# Patient Record
Sex: Male | Born: 1950
Health system: Southern US, Community
[De-identification: ages and names within clinical notes are randomized; demographics above are authoritative.]

## PROBLEM LIST (undated history)

## (undated) DIAGNOSIS — C3490 Malignant neoplasm of unspecified part of unspecified bronchus or lung: Secondary | ICD-10-CM

## (undated) DIAGNOSIS — I739 Peripheral vascular disease, unspecified: Secondary | ICD-10-CM

## (undated) DIAGNOSIS — I10 Essential (primary) hypertension: Secondary | ICD-10-CM

## (undated) DIAGNOSIS — C679 Malignant neoplasm of bladder, unspecified: Secondary | ICD-10-CM

## (undated) HISTORY — PX: VASCULAR SURGERY: SHX849

## (undated) HISTORY — PX: BLADDER REMOVAL: SHX567

## (undated) HISTORY — DX: Malignant neoplasm of unspecified part of unspecified bronchus or lung: C34.90

## (undated) HISTORY — PX: OTHER SURGICAL HISTORY: SHX169

---

## 2005-10-05 ENCOUNTER — Encounter: Payer: Self-pay | Admitting: Specialist

## 2005-10-24 ENCOUNTER — Encounter: Payer: Self-pay | Admitting: Specialist

## 2007-03-31 ENCOUNTER — Ambulatory Visit: Payer: Self-pay | Admitting: Urology

## 2007-04-14 ENCOUNTER — Ambulatory Visit: Payer: Self-pay

## 2007-04-14 ENCOUNTER — Other Ambulatory Visit: Payer: Self-pay

## 2010-06-20 ENCOUNTER — Inpatient Hospital Stay: Payer: Self-pay | Admitting: Specialist

## 2010-07-21 ENCOUNTER — Emergency Department: Payer: Self-pay | Admitting: Emergency Medicine

## 2010-07-26 DIAGNOSIS — C679 Malignant neoplasm of bladder, unspecified: Secondary | ICD-10-CM

## 2010-07-26 HISTORY — DX: Malignant neoplasm of bladder, unspecified: C67.9

## 2010-12-17 DIAGNOSIS — C679 Malignant neoplasm of bladder, unspecified: Secondary | ICD-10-CM | POA: Insufficient documentation

## 2010-12-17 HISTORY — DX: Malignant neoplasm of bladder, unspecified: C67.9

## 2011-02-26 DIAGNOSIS — I745 Embolism and thrombosis of iliac artery: Secondary | ICD-10-CM | POA: Insufficient documentation

## 2011-03-29 DIAGNOSIS — F17200 Nicotine dependence, unspecified, uncomplicated: Secondary | ICD-10-CM | POA: Insufficient documentation

## 2013-01-14 ENCOUNTER — Emergency Department: Payer: Self-pay | Admitting: Emergency Medicine

## 2013-01-14 LAB — BASIC METABOLIC PANEL
Anion Gap: 8 (ref 7–16)
BUN: 33 mg/dL — ABNORMAL HIGH (ref 7–18)
Chloride: 109 mmol/L — ABNORMAL HIGH (ref 98–107)
Co2: 24 mmol/L (ref 21–32)
Creatinine: 2.06 mg/dL — ABNORMAL HIGH (ref 0.60–1.30)
EGFR (African American): 39 — ABNORMAL LOW
EGFR (Non-African Amer.): 34 — ABNORMAL LOW
Glucose: 99 mg/dL (ref 65–99)
Potassium: 4.3 mmol/L (ref 3.5–5.1)
Sodium: 141 mmol/L (ref 136–145)

## 2013-01-14 LAB — CBC
HCT: 47.8 % (ref 40.0–52.0)
HGB: 16.5 g/dL (ref 13.0–18.0)
MCHC: 34.6 g/dL (ref 32.0–36.0)
MCV: 92 fL (ref 80–100)
RBC: 5.2 10*6/uL (ref 4.40–5.90)
RDW: 13.2 % (ref 11.5–14.5)
WBC: 10 10*3/uL (ref 3.8–10.6)

## 2013-01-17 ENCOUNTER — Ambulatory Visit: Payer: Self-pay | Admitting: Vascular Surgery

## 2013-02-06 ENCOUNTER — Ambulatory Visit: Payer: Self-pay | Admitting: Vascular Surgery

## 2013-02-06 LAB — BUN: BUN: 32 mg/dL — ABNORMAL HIGH (ref 7–18)

## 2013-02-06 LAB — CREATININE, SERUM: EGFR (Non-African Amer.): 35 — ABNORMAL LOW

## 2013-03-07 ENCOUNTER — Ambulatory Visit: Payer: Self-pay | Admitting: Vascular Surgery

## 2013-03-07 LAB — CBC
HGB: 16.5 g/dL (ref 13.0–18.0)
MCH: 32.1 pg (ref 26.0–34.0)
MCHC: 34.2 g/dL (ref 32.0–36.0)
MCV: 94 fL (ref 80–100)
Platelet: 202 10*3/uL (ref 150–440)
RDW: 13.4 % (ref 11.5–14.5)
WBC: 9.5 10*3/uL (ref 3.8–10.6)

## 2013-03-07 LAB — BASIC METABOLIC PANEL
Creatinine: 1.68 mg/dL — ABNORMAL HIGH (ref 0.60–1.30)
EGFR (African American): 50 — ABNORMAL LOW
Glucose: 77 mg/dL (ref 65–99)
Osmolality: 281 (ref 275–301)
Sodium: 138 mmol/L (ref 136–145)

## 2013-03-14 ENCOUNTER — Inpatient Hospital Stay: Payer: Self-pay | Admitting: Vascular Surgery

## 2013-03-14 LAB — BASIC METABOLIC PANEL
BUN: 29 mg/dL — ABNORMAL HIGH (ref 7–18)
Calcium, Total: 8.5 mg/dL (ref 8.5–10.1)
Chloride: 110 mmol/L — ABNORMAL HIGH (ref 98–107)
Co2: 25 mmol/L (ref 21–32)
Creatinine: 2.02 mg/dL — ABNORMAL HIGH (ref 0.60–1.30)
EGFR (Non-African Amer.): 34 — ABNORMAL LOW
Glucose: 108 mg/dL — ABNORMAL HIGH (ref 65–99)
Sodium: 138 mmol/L (ref 136–145)

## 2013-03-15 LAB — LIPID PANEL
Cholesterol: 131 mg/dL (ref 0–200)
Ldl Cholesterol, Calc: 79 mg/dL (ref 0–100)
Triglycerides: 84 mg/dL (ref 0–200)
VLDL Cholesterol, Calc: 17 mg/dL (ref 5–40)

## 2013-03-15 LAB — CBC WITH DIFFERENTIAL/PLATELET
Basophil %: 0.2 %
Eosinophil #: 0.1 10*3/uL (ref 0.0–0.7)
HCT: 42 % (ref 40.0–52.0)
HGB: 14.6 g/dL (ref 13.0–18.0)
MCH: 32.3 pg (ref 26.0–34.0)
MCHC: 34.8 g/dL (ref 32.0–36.0)
Monocyte #: 0.9 x10 3/mm (ref 0.2–1.0)
Neutrophil %: 77.4 %
Platelet: 149 10*3/uL — ABNORMAL LOW (ref 150–440)
RDW: 13.5 % (ref 11.5–14.5)
WBC: 10.2 10*3/uL (ref 3.8–10.6)

## 2013-03-15 LAB — BASIC METABOLIC PANEL
Anion Gap: 3 — ABNORMAL LOW (ref 7–16)
BUN: 24 mg/dL — ABNORMAL HIGH (ref 7–18)
Chloride: 106 mmol/L (ref 98–107)
Co2: 28 mmol/L (ref 21–32)
EGFR (African American): 46 — ABNORMAL LOW
Potassium: 4.2 mmol/L (ref 3.5–5.1)
Sodium: 137 mmol/L (ref 136–145)

## 2013-03-16 LAB — BASIC METABOLIC PANEL
Anion Gap: 6 — ABNORMAL LOW (ref 7–16)
BUN: 22 mg/dL — ABNORMAL HIGH (ref 7–18)
Creatinine: 1.76 mg/dL — ABNORMAL HIGH (ref 0.60–1.30)
EGFR (Non-African Amer.): 41 — ABNORMAL LOW
Glucose: 96 mg/dL (ref 65–99)
Osmolality: 277 (ref 275–301)

## 2013-07-12 ENCOUNTER — Other Ambulatory Visit: Payer: Self-pay

## 2013-07-12 LAB — CBC WITH DIFFERENTIAL/PLATELET
Eosinophil #: 0.2 10*3/uL (ref 0.0–0.7)
HGB: 17.1 g/dL (ref 13.0–18.0)
Lymphocyte #: 1.8 10*3/uL (ref 1.0–3.6)
Lymphocyte %: 20.6 %
Monocyte #: 0.6 x10 3/mm (ref 0.2–1.0)
Neutrophil #: 6.2 10*3/uL (ref 1.4–6.5)
Neutrophil %: 69.9 %
Platelet: 185 10*3/uL (ref 150–440)
RBC: 5.43 10*6/uL (ref 4.40–5.90)

## 2013-07-12 LAB — COMPREHENSIVE METABOLIC PANEL
Anion Gap: 4 — ABNORMAL LOW (ref 7–16)
BUN: 29 mg/dL — ABNORMAL HIGH (ref 7–18)
Co2: 25 mmol/L (ref 21–32)
EGFR (African American): 42 — ABNORMAL LOW
EGFR (Non-African Amer.): 36 — ABNORMAL LOW
Glucose: 108 mg/dL — ABNORMAL HIGH (ref 65–99)
Osmolality: 286 (ref 275–301)
SGOT(AST): 19 U/L (ref 15–37)
Sodium: 140 mmol/L (ref 136–145)
Total Protein: 8.2 g/dL (ref 6.4–8.2)

## 2013-07-12 LAB — LIPID PANEL
HDL Cholesterol: 42 mg/dL (ref 40–60)
VLDL Cholesterol, Calc: 11 mg/dL (ref 5–40)

## 2014-11-15 NOTE — Op Note (Signed)
PATIENT NAME:  Martin Jennings MR#:  [id number] DATE OF BIRTH:  October 20, 1950  DATE OF PROCEDURE:  03/14/2013  PREOPERATIVE DIAGNOSES:  1.  Atherosclerotic occlusive disease, bilateral lower extremities.  2.  Gangrene, left great toe.  3.  Critical ischemia, left limb.   POSTOPERATIVE DIAGNOSES: 1.  Atherosclerotic occlusive disease, bilateral lower extremities.  2.  Gangrene, left great toe.  3.  Critical ischemia, left limb.   PROCEDURE PERFORMED:  Femoral-to-femoral bypass grafting using 6 mm PTFE.   SURGEONS: Dr. Lucky Cowboy, Dr. Delana Meyer.   ANESTHESIA: General, by endotracheal intubation.   FLUIDS: Per anesthesia record.   SPECIMEN: None.   ESTIMATED BLOOD LOSS: 200 mL.   INDICATIONS: Mr. Martin Jennings is a 64 year old gentleman who presented with increasing pain of his left foot. He was noted to have gangrenous changes of the left great toe as well as increasing skin changes of the forefoot. He has undergone attempted surgical repair of the left common femoral in the past. Angiography has demonstrated that this prior repair has occluded, and he is therefore requiring in-flow.   The risks and benefits for surgical bypass were reviewed. All questions answered. The patient agrees to proceed.   DESCRIPTION OF PROCEDURE: The patient was taken to the operating room and placed in the supine position. After adequate general anesthesia was induced and appropriate invasive monitors were placed, he was prepped from above the umbilicus down to the distal thigh and then draped in a sterile fashion. Appropriate time-out was called.   Working simultaneously with Dr. Lucky Cowboy on the left side and myself on the right, vertical incisions were created and the dissection was carried down. The common femoral, profunda femoris and superficial femoral arteries were identified and they were individually looped with Silastic vessel loops. A Gore tunneler was then used to create a tunnel for the bypass anterior to the fascia. A   6 mm ringed PTFE Propaten graft was then pulled through the tunnel, maintaining orientation with the racing stripe.   Five-thousand units of heparin were given. After 4 minutes of circulation, the right common femoral, profunda femoris, superficial femoral artery was clamped. Arteriotomy was made with a #11 blade and extended with Potts scissors. Graft was beveled in an end-graft-to-side common femoral artery anastomosis fashion with running CV-5 suture. Flushing maneuvers were performed and flow was re-established, first to the right profunda then to the superficial femoral. The graft was clamped just above the suture line. The graft was then checked for appropriate length, and again arteriotomy was made on the left, this time utilizing the distal common femoral and extending well-out onto the SFA. The graft was beveled and an end-graft-to-side femoral artery anastomosis was fashioned with running CV-5 suture. Flushing maneuvers were performed and flow was established through the graft and then into the profunda on the left, and then the superficial femoral.   Suture lines were inspected. A small area on the right side was controlled with a pledgeted suture. Surgicel was placed, followed by Evicel, and subsequently the groins were closed in multiple layers, re-approximating the femoral sheath with running 2-0 Vicryl, followed by several layers of running 3-0 Vicryl, followed by 4-0 Monocryl subcuticular. A VAC was then applied as the dressing.   The patient tolerated the procedure well. There were no immediate complications. Sponge and needle counts were correct, and he was taken to the recovery area in excellent condition.     ____________________________ Katha Cabal, MD ggs:dm D: 03/14/2013 11:00:59 ET T: 03/14/2013 11:17:43 ET JOB#: [id number]  cc: Katha Cabal, MD, <Dictator> Katha Cabal MD ELECTRONICALLY SIGNED 04/06/2013 9:11

## 2014-11-15 NOTE — Op Note (Signed)
PATIENT NAME:  Martin Jennings, Martin Jennings MR#:  [id number] DATE OF BIRTH:  October 12, 1950  DATE OF PROCEDURE:  01/17/2013  PREOPERATIVE DIAGNOSES:  1. Atherosclerotic occlusive disease, bilateral lower extremities, with rest pain of the left lower extremity.  2. Complication of vascular device; thrombosis of previous repair, left groin.   POSTOPERATIVE DIAGNOSES:  1. Atherosclerotic occlusive disease, bilateral lower extremities, with rest pain of the left lower extremity.  2. Complication of vascular device; thrombosis of previous repair, left groin.  3. Malignant hypertension.  4. Renal artery stenosis, left side.   PROCEDURES PERFORMED:  1. Abdominal aortogram.  2. Selective injection, left renal artery.  3. Bilateral lower extremity distal runoff.   PROCEDURE PERFORMED BY: Katha Cabal, M.D.   SEDATION: Versed 4 mg plus fentanyl 150 mcg administered IV. Continuous ECG, pulse oximetry and cardiopulmonary monitoring performed throughout the entire procedure by the interventional radiology nurse. Total sedation time is 1 hour.   ACCESS: A 5-French sheath, left common femoral artery.   FLUOROSCOPY TIME: 1.6 minutes.   CONTRAST USED: 70 mL.   INDICATIONS: Martin Jennings is a 64 year old gentleman who presented to the office with increasing pain of his left lower extremity. He was recently seen in the ER and subsequently referred to the office for this symptom. In the ER, he was also noted to have significant renal insufficiency with a creatinine of 2.6. The risks and benefits for evaluation of his atherosclerotic occlusive disease were reviewed with the patient. All questions have been answered. The patient agrees to proceed.   PROCEDURE: The patient is taken to the special procedures suite and placed in the supine position. After adequate sedation is achieved, both groins are prepped and draped in a sterile fashion. Lidocaine 1% is infiltrated in the soft tissues on the right. Ultrasound is placed in  a sterile sleeve. The common femoral vein is identified. It is echolucent and pulsatile, indicating patency. Image is recorded. Under direct visualization, a micropuncture needle is inserted, microwire followed by microsheath, J-wire followed by 5-French sheath and 5-French pigtail catheter.   The pigtail catheter is positioned at the level of T12, and AP projection of the aorta is obtained. Suggestion of renal artery stenosis is noted, and the patient has a blood pressure at the time of the procedure of 630/160 diastolic. He required intravenous labetalol and hydralazine to control his pressure.   The pigtail catheter is then repositioned to just above the bifurcation, and an LAO projection is obtained. Multiple stents are noted on the left, and there is thrombosis of the previous stent/repair from the origin of the common iliac to the common femoral. Oblique view of the pelvis is obtained. The detector is repositioned AP, and bilateral lower extremity runoff is obtained.   The pigtail catheter is then exchanged over a wire for a C2, and the left renal artery is engaged. Magnified views of the renal artery are then obtained.   Before closure, magnified oblique and AP views of the right external iliac are also obtained.   A closure device is then deployed. A Mynx is utilized without difficulty.   INTERPRETATION: The abdominal aorta is opacified with a bolus injection of contrast. There are no hemodynamically significant stenoses, although diffuse calcific disease is present. Suggestion of a left renal artery stenosis is confirmed on selective magnified views, where a 75% to 80% stenosis at the origin of the left renal artery is noted. Right renal artery is widely patent. There are single renal arteries bilaterally. Nephrograms appear equal  in size bilaterally.   The left common and external iliac artery as well as the common femoral artery on the left are all occluded. There are multiple stents noted.  There is patency of the distal few millimeters on the left allowing the SFA and the profunda femoris to communicate.   The right external iliac artery demonstrates 2 areas of focal stenosis, with the more prominent being approximately 65%.   The left lower extremity, as noted, the majority of the common femoral is occluded. There is patency of the profunda femoris as well as the superficial femoral and the popliteal. The trifurcation appears patent, as are all 3 tibials.   On the right, the common femoral demonstrates approximately a 30% to 40% stenosis in its midportion. Profunda femoris and SFA are patent. Popliteal on the right is patent, as is the trifurcation, and all 3 tibials are noted.   SUMMARY:  1. Occlusion of the left common iliac external and majority of the common femoral, with history of both stenting as well as open surgical repair.  2. A 75% left renal artery stenosis associated with malignant hypertension and renal insufficiency.  3. Approximately 65% stenosis of the right external iliac artery.   Given the presumed repair of the left lower extremity revascularization via a femoral-femoral bypass, I have proposed to the patient that his left renal artery must be stented to control his hypertension and hopefully to improve his renal function, and at that time, an external iliac artery stent on the right can be obtained to maximize inflow since both right and left lower extremities will be based off the right iliac system.   ____________________________ Katha Cabal, MD ggs:gb D: 01/17/2013 16:53:52 ET T: 01/17/2013 21:55:01 ET JOB#: [id number]  cc: Katha Cabal, MD, <Dictator> Katha Cabal MD ELECTRONICALLY SIGNED 01/30/2013 14:29

## 2014-11-15 NOTE — Consult Note (Signed)
PATIENT NAME:  KEESHAWN, FAKHOURI MR#:  [id number] DATE OF BIRTH:  [birth year]  DATE OF CONSULTATION:  03/14/2013  REFERRING PHYSICIAN:  Katha Cabal, MD CONSULTING PHYSICIAN:  Vivien Presto, MD PRIMARY CARE PHYSICIAN: None  REASON FOR CONSULTATION: Help with medical management.   HISTORY OF PRESENT ILLNESS: The patient is a pleasant 64 year old African American male with history of remote bladder tumor status post resection and now has urostomy bag, history of hypertension and renal artery stenosis in the past who underwent a stent placement on July 15 on the left, CKD stage IV, who does not follow with physician. The patient has been taken care of by Dr. Delana Meyer for various vascular issues, including the placement of the stent and also peripheral arterial disease with left lower extremity aortograms with runoff and a stent in the right external iliac as well. The patient came in today for scheduled fem-fem bypass for having pain at rest in the left lower extremity and foot and some ischemic changes of the great toe. Medicine was consulted for help with medical management. The patient denies having any chest pain, shortness of breath, pains in the belly. Postoperatively complains of some left eye pain and feels like there is something inside of his eye, and ophthalmology has been consulted as well. His pain at the surgical site is stable. He has had labs recently in the last week, which shows a creatinine of 1.68, lower than 2 on July 15. He is accompanied by his mother and wife who are in the room. He has no other specific complaints besides left great toe pain and the eye pain. Has had no fevers or chills and his weight is steady.   PAST MEDICAL HISTORY: As above, including history of bladder tumor status post resection and now has a urostomy bag, history of tobacco abuse which is ongoing, obesity, pancreatitis, peripheral vascular disease, status post failed left endarterectomy, status post stent  placement in renal artery as above.   ALLERGIES: No known drug allergies.   OUTPATIENT MEDICATIONS: Acetaminophen/oxycodone 325/5 mg 1 tab every 4 to 6 hours as needed, aspirin 81 mg daily, Plavix 75 mg daily, lisinopril 5 mg daily.   SOCIAL HISTORY: Still smokes about 1/2 pack to a pack a day. Used to be a drinker, quit about 30 years ago. No drug use. He is retired. He is on disability.   FAMILY HISTORY: Father and mother both with high blood pressure and cholesterol. Mom with cervical and bladder cancer.   REVIEW OF SYSTEMS:    CONSTITUTIONAL: Denies fever, but had some fatigue.  EYES: No chronic blurry vision or double vision. Postop, he has some eye pain.  RESPIRATORY: No cough, wheezing, shortness of breath, dyspnea on exertion, COPD or painful respirations.  CARDIOVASCULAR: Denies chest pain, swelling in the legs, arrhythmia or palpitations. No dyspnea on exertion, but he states he could not walk much because of pain in the lower extremity. GASTROINTESTINAL: Denies nausea, vomiting, diarrhea, abdominal pain, black stools, melena or ulcers.  GENITOURINARY: Denies dysuria, hematuria. Has had a renal artery stent.  ENDOCRINE: Denies polyuria or nocturia. HEMATOLOGIC AND LYMPHATIC: Denies anemia or easy bruising.  SKIN: No rashes.  MUSCULOSKELETAL: Has chronic pain in the left lower extremity.  NEUROLOGIC: Denies focal weakness or numbness.  PSYCHIATRIC: Denies anxiety or insomnia.   PHYSICAL EXAMINATION: VITAL SIGNS: Temperature 97.9, pulse rate 76, respiratory rate 16, blood pressure 129/70, O2 sat 93% on 2 liters.  GENERAL: The patient is a well-developed male lying in  bed in no obvious distress, has his eyes closed.  HEENT: Normocephalic, atraumatic. Pupils appear to be equal and reactive. Extraocular muscles intact. Dry mucous membranes.  NECK: Supple. No thyroid tenderness. No cervical lymphadenopathy.  CARDIOVASCULAR: S1, S2, regular rate and rhythm. No murmurs, rubs or  gallops.  LUNGS: Clear to auscultation. No wheezing, rhonchi or rales.  ABDOMEN: Soft, nontender, nondistended.  EXTREMITIES: The patient has a dressing in the groin, bilateral, wound VAC intact postop. Lower extremity shows no significant edema. The patient has significant tenderness on left great toe with some bruising and black coloration of the great toe. There is no odor and there is no significant drainage.  NEUROLOGIC: Cranial nerves appear to be intact II through XII. Sensation is intact to light touch.  PSYCHIATRIC: Awake, alert, oriented x 3.  SKIN: No other obvious rashes.   LABORATORY DATA:  No labs today except for a blood glucose of 123. On August 13, BUN was 30, creatinine  1.68, sodium 138, potassium 4.5. CBC was within normal limits.   ASSESSMENT AND PLAN: We have a 64 year old with severe peripheral vascular disease, renal artery stenosis status post stent, who has had a femoral-femoral bypass today and medicine was consulted for help with medical management. He appears to have chronic kidney disease stage III which appears to be stable, but there is no BMP for today which I would order. His blood pressure is stable. He has no fever. In regards to the fem-fem bypass, this will be taken care of by vascular surgery. He is on aspirin and Plavix. Would check a lipid profile in the morning and consider a statin. His creatinine actually is lower than what it was in the middle of this year. The patient did have renal artery stenosis on the left and has had a stent placed. We will see what the kidney function is. He is on gentle lactated Ringer's, which I would continue at this point. He still smokes tobacco and he was counseled for 3 minutes about cessation, but he does not want a patch. Consider starting deep vein prophylaxis once he is an acceptable bleeding risk. Currently, he is on sequential compression devices and TEDs. He states his pain is controlled.  Thank you for allowing [place] to see  your patient. We will follow along with you.   TOTAL TIME SPENT: 55 minutes.   ____________________________ Vivien Presto, MD sa:jm D: 03/14/2013 15:19:25 ET T: 03/14/2013 16:01:03 ET JOB#: [id number]  cc: Vivien Presto, MD, <Dictator> Vivien Presto MD ELECTRONICALLY SIGNED 04/02/2013 12:05

## 2014-11-15 NOTE — Discharge Summary (Signed)
PATIENT NAME:  Martin Jennings, Martin Jennings MR#:  [id number] DATE OF BIRTH:  [birth year]  DATE OF ADMISSION:  03/14/2013 DATE OF DISCHARGE:  03/19/2013  DISCHARGE DIAGNOSIS: Atherosclerotic occlusive disease bilateral lower extremities with ulceration of the left great toe.   SECONDARY DIAGNOSES: Hypertension, diffuse degenerative joint disease.   PROCEDURE PERFORMED: Femoral-femoral bypass, date March 14, 2013.   CONSULTATIONS: Interior and spatial designer.   HISTORY OF PRESENT ILLNESS: Mr. Waldman is a 64 year old gentleman who presents with increasing pain, ulceration, gangrenous changes to the left great toe. Physical examination as well as noninvasive studies and subsequently angiography have defined significant atherosclerosis of the left iliac as well as common femoral. He is, therefore, felt to be a poor interventional candidate and is undergoing plans for femoral-femoral bypass. In preparation, he has undergone stenting of his right external iliac to allow maximal inflow.   HOSPITAL COURSE: On the day of admission, he underwent successful femoral-to-femoral bypass grafting using a 6 mm PTFE. Dr. Lucky Cowboy and myself were co-surgeons.  Postoperatively, he has done well. He has had a normal postoperative course. He began sitting up in a chair postoperative day #1.  He began ambulating postoperative day #3.  On postoperative day #5, the VAC is removed.  His incisions all appear clean and intact and healing quite well. Dry gauze dressings were placed. He now has a palpable pulse in his left foot.   DISPOSITION:  The patient is discharged to home.   DISCHARGE INSTRUCTIONS:  He is to follow up with me in approximately 1 week. He is to continue Plavix and aspirin. No changes are made to his home medications. His diet is regular as tolerated. Smoking cessation was stressed.   CONDITION ON DISCHARGE: Improved.   ____________________________ Katha Cabal, MD ggs:cs D: 03/19/2013 17:22:09  ET T: 03/19/2013 20:08:47 ET JOB#: [id number]  cc: Katha Cabal, MD, <Dictator> Katha Cabal MD ELECTRONICALLY SIGNED 04/06/2013 9:11

## 2014-11-15 NOTE — Consult Note (Signed)
Brief Consult Note: Diagnosis: medical mgmt post fem/fem bypas.   Patient was seen by consultant.   Consult note dictated.   Recommend further assessment or treatment.   Orders entered.   Discussed with Attending MD.   Comments: 64 yo m with pvd sp fem fem bypass and medicine consulted for medical mgmt *fem-fem bypas for pvd and left great toe inshemia. per vascular. asa/plavix. check lipids in am. consider statin *renal failure, appears to be chronic stage 3. does not see a doctor. recommend f/u with nephro: last week. cr of 1.6. no other labs today. recheck bmp. on LR. continue.  *tobacco abuse counsaled for 60mn. does not want patch consider starting dvt ppx once pt is at acceptable bleedin risk. on teds. pain controlled.  Electronic Signatures: AVivien Presto(MD)  (Signed 20-Aug-14 15:08)  Authored: Brief Consult Note   Last Updated: 20-Aug-14 15:08 by AVivien Presto(MD)

## 2014-11-15 NOTE — Op Note (Signed)
PATIENT NAME:  Martin Jennings, Martin Jennings MR#:  [id number] DATE OF BIRTH:  11-11-1950  DATE OF PROCEDURE:  03/14/2013  PREOPERATIVE DIAGNOSES: 1.  Peripheral arterial disease with rest pain, left lower extremity.  2.  Status post previous failed left femoral endarterectomy, with left iliac occlusive disease.   POSTOPERATIVE DIAGNOSES: 1.  Peripheral arterial disease with rest pain, left lower extremity.  2.  Status post previous failed left femoral endarterectomy, with left iliac occlusive disease.   PROCEDURE: Right-to-left femoral-to-femoral bypass, with 6 mm ringed PTFE graft.   CO-SURGEONS:  English as a second language teacher.   ANESTHESIA: General.   BLOOD LOSS: 200 mL.   INDICATION FOR PROCEDURE: A 64 year old male with extensive peripheral vascular disease. He has a left iliac occlusion and is going to have a femoral-to-femoral bypass to try to alleviate his symptoms. Risks and benefits were discussed. Informed consent was obtained.   DESCRIPTION OF PROCEDURE: The patient was brought to the operative suite and after an adequate level of general anesthesia was obtained the abdomen, groins, and thighs were sterilely prepped and draped and a sterile surgical field was created. Vertical incisions were made overlying both femoral arteries. The left femoral artery dissection was quite tedious due to his previous endarterectomy of the left femoral artery. I dissected out so that the profunda, SFA, and proximal femoral artery to be control. A similar was done on the right, and the patient was systemically heparinized. A tunnel was then used to create a tunnel from the right groin to the left groin, and a 6 mm ringed Propaten graft was selected. Control was then obtained with vessel loops and vascular clamps.   An anterior wall arteriotomy was created with an 11-blade and extended with Potts scissors on both sides, on the left due to his good distal runoff. This was fitted down onto the superficial femoral artery. Anastomosis  was created with a running CV-6 suture bilaterally, and a single 7-0 Prolene patch with a pledget was used for hemostasis.   The wounds were then irrigated. Surgicel and Evicel topical hemostatic agents were placed and hemostasis was complete. The wound was then closed with several layers of 2-0 and 3-0 Vicryl, and then 4-0 Monocryl. A VAC sponge was cut to fit the wound and used and Ioban was used to get a good seal with the VAC. The patient was then awakened from anesthesia and taken to the recovery room in stable condition, having tolerated the procedure well.    ____________________________ Algernon Huxley, MD jsd:dm D: 03/14/2013 11:08:32 ET T: 03/14/2013 11:45:31 ET JOB#: [id number]  cc: Algernon Huxley, MD, <Dictator> Algernon Huxley MD ELECTRONICALLY SIGNED 03/28/2013 16:11

## 2014-11-15 NOTE — Op Note (Signed)
PATIENT NAME:  Martin Jennings, Martin Jennings MR#:  [id number] DATE OF BIRTH:  June 03, 1951  DATE OF PROCEDURE:  02/06/2013  PREOPERATIVE DIAGNOSES: 1.  Malignant hypertension with systolic pressures in excess of 180 mmHg and end diastolics over 505.  2.  Renal insufficiency, stage IV.  3.  Left renal artery stenosis.  4.  Atherosclerotic occlusive disease of bilateral lower extremities with rest pain of the left lower extremity.   POSTOPERATIVE DIAGNOSES:  1.  Malignant hypertension with systolic pressures in excess of 180 mmHg and end diastolics over 397.  2.  Renal insufficiency, stage IV.  3.  Left renal artery stenosis.  4.  Atherosclerotic occlusive disease of bilateral lower extremities with rest pain of the left lower extremity.   PROCEDURES PERFORMED: 1.  Introduction catheter left renal artery first order catheter placement.  2.  Percutaneous transluminal angioplasty and stent placement left renal artery with the GuardWire embolic protection device.  3.  Percutaneous transluminal angioplasty and stent placement of the right external iliac artery.   SURGEON: Martin Pilar, MD  SEDATION: Versed 4 mg plus fentanyl 150 mcg administered IV. Continuous ECG, pulse oximetry and cardiopulmonary monitoring was performed throughout the entire procedure by the interventional radiology nurse. Total sedation time was 1 hour 40 minutes.   ACCESS: 6-French sheath right common femoral artery.   FLUOROSCOPY TIME: 10.9 minutes.   CONTRAST USED: Isovue 45 mL.   INDICATIONS: Martin Jennings is a 64 year old gentleman with severe malignant hypertension in association with renal insufficiency and baseline creatinine of approximately 2.0. On diagnostic angiography, in evaluation for his lower extremity rest pain, he was noted to have 80% renal artery stenosis on the left.  Right renal artery is patent. He was also noted to have occlusion of his entire left iliac system extending into the midportion of the common femoral  on the left with a high-grade greater than 60% stenosis on the right, in the external iliac. At the time of his diagnostic evaluation, considerations for contrast burden dictated that remain a diagnostic study. His creatinine did not change over the last 7 to 10 days.  He has been stable, and therefore, he is now returning for intervention with the hope for improvement of his hypertension and his renal function as well as treatment of his iliac lesion in preparation for a femoral to femoral bypass. Risks and benefits were reviewed. All questions answered. The patient has agreed to proceed.   DESCRIPTION OF PROCEDURE: The patient is taken to special procedures and placed in the supine position. After adequate sedation is achieved, right groin is prepped and draped in sterile fashion. 1% lidocaine is infiltrated in the soft tissues. Ultrasound is placed in a sterile sleeve. Ultrasound is utilized secondary to lack of appropriate landmarks to avoid vascular injury. Under direct ultrasound visualization, the common femoral artery is identified. It is scanned from the ligament to the bifurcation and then a segment in its midportion is selected. 1% lidocaine is infiltrated into the soft tissues under ultrasound visualization and subsequently a micropuncture needle is used to access the common femoral artery. Under direct ultrasound visualization image is recorded. Artery is noted to be echolucent and pulsatile indicating patency. Microwire followed by microsheath, J-wire followed by 5-French sheath. J-wire is then upsized and a 6-French Ansel sheath is advanced to just above the aortic bifurcation. Floppy glidewire and a C2 catheter are then used to access the renal artery, and the catheter is advanced into the renal artery on the left. Hand injection of contrast  demonstrates the renal artery anatomy verified as the high-grade stricture/stenosis and 5000 units of heparin is given. Wire is reintroduced and advanced out  slightly. The catheter is then advanced and the sheath is advanced so that it is up to the origin of the renal artery. Wire is then removed.   GuardWire has been prepped on the field. Balloon inflation has been observed x 2.  Measurements are made of the renal artery, which is noted to be approximately 5 mm. Location of the initial branches is identified and the wire is advanced through the C2 catheter. The balloon is positioned just proximal to the first major divisions and inflated to 6. The balloon is well visualized on fluoroscopy. Once the balloon is inflated, the inflation device is removed, the C2 catheter is removed from the GuardWire, and a Herculink 5 x 15, 0.014 stent plaque form is advanced up and over the wire and positioned. Several hand injections of contrast is used to verify position. The stent extends into the aorta by a millimeter or 2 and subsequently the stent is deployed to 12 atmospheres for 20 seconds. The balloon is then deflated. The retrieval catheter is advanced over the wire, multiple aspirations are made, and then the retrieval catheter is removed. At this time, final images are obtained. The stent appears to be well approximated and an excellent size match to the renal artery. It extends approximately 1 mm or so into the aorta and is widely patent and uniformly expanded throughout.   The sheath is then repositioned down to the bifurcation and a 180 Magic torque wire is exchanged and an 11 cm sheath is placed. Hand injection of contrast is utilized to demonstrate the iliac, on the right, and in a LAO projection measurements are then made and a 7 x 60 Absolute Pro self-expanding stent is advanced over the wire. It is deployed just below the origin of the external iliac artery extending down. It is post dilated with a 6 x 4 balloon, 2 inflations are made, again to 10 to 12 atmospheres. Follow-up angiography demonstrates a well-expanded stent with significant improvement in the right  external iliac artery which now appears more than suitable as a base for fem-fem bypass.   Oblique view of the right groin is then obtained and a Mynx device is deployed without difficulty. There are no immediate complications.   INTERPRETATION: Initial view of the selective injection of the renal demonstrates a high-grade stenosis, as described above. Measurements demonstrate approximately a 5 mm renal artery and therefore a 5 x 15 balloon expandable stent is deployed without difficulty, retrieval is performed without difficulty, and the [employer] is removed. Oblique views of the right external are then obtained. Subsequently, a 7 x 60 Absolute Pro stent is deployed, post dilated to 6 mm, again matching the existing native artery quite nicely. Stent is well opposed and uniformly deployed.   SUMMARY: Successful left renal artery angioplasty as well as right external iliac artery angioplasty with stent placement in both locations and a very modest 45 mL of contrast burden.  ____________________________ Katha Cabal, MD ggs:sb D: 02/06/2013 10:52:18 ET T: 02/06/2013 11:17:43 ET JOB#: [id number]  cc: Katha Cabal, MD, <Dictator> Katha Cabal MD ELECTRONICALLY SIGNED 02/10/2013 11:14

## 2015-01-08 ENCOUNTER — Other Ambulatory Visit
Admission: RE | Admit: 2015-01-08 | Discharge: 2015-01-08 | Disposition: A | Discharge status: Home / Self Care | Payer: Commercial Managed Care - HMO | Source: Ambulatory Visit | Attending: Nurse Practitioner | Admitting: Nurse Practitioner

## 2015-01-08 DIAGNOSIS — I2510 Atherosclerotic heart disease of native coronary artery without angina pectoris: Secondary | ICD-10-CM | POA: Insufficient documentation

## 2015-01-08 DIAGNOSIS — Z125 Encounter for screening for malignant neoplasm of prostate: Secondary | ICD-10-CM | POA: Insufficient documentation

## 2015-01-08 LAB — CBC
HCT: 52.2 % — ABNORMAL HIGH (ref 40.0–52.0)
Hemoglobin: 17.2 g/dL (ref 13.0–18.0)
MCH: 31.2 pg (ref 26.0–34.0)
MCHC: 32.9 g/dL (ref 32.0–36.0)
MCV: 94.8 fL (ref 80.0–100.0)
PLATELETS: 173 10*3/uL (ref 150–440)
RBC: 5.51 MIL/uL (ref 4.40–5.90)
RDW: 13.4 % (ref 11.5–14.5)
WBC: 7.8 10*3/uL (ref 3.8–10.6)

## 2015-01-08 LAB — COMPREHENSIVE METABOLIC PANEL
ALBUMIN: 4 g/dL (ref 3.5–5.0)
ALT: 11 U/L — ABNORMAL LOW (ref 17–63)
ANION GAP: 6 (ref 5–15)
AST: 15 U/L (ref 15–41)
Alkaline Phosphatase: 62 U/L (ref 38–126)
BILIRUBIN TOTAL: 0.5 mg/dL (ref 0.3–1.2)
BUN: 23 mg/dL — AB (ref 6–20)
CO2: 24 mmol/L (ref 22–32)
CREATININE: 1.79 mg/dL — AB (ref 0.61–1.24)
Calcium: 9.1 mg/dL (ref 8.9–10.3)
Chloride: 108 mmol/L (ref 101–111)
GFR calc Af Amer: 45 mL/min — ABNORMAL LOW (ref >60)
GFR calc non Af Amer: 39 mL/min — ABNORMAL LOW (ref >60)
Glucose, Bld: 108 mg/dL — ABNORMAL HIGH (ref 65–99)
POTASSIUM: 4.2 mmol/L (ref 3.5–5.1)
SODIUM: 138 mmol/L (ref 135–145)
TOTAL PROTEIN: 7.7 g/dL (ref 6.5–8.1)

## 2015-01-08 LAB — LIPID PANEL
CHOLESTEROL: 176 mg/dL (ref 0–200)
HDL: 43 mg/dL (ref >40)
LDL Cholesterol: 120 mg/dL — ABNORMAL HIGH (ref 0–99)
TRIGLYCERIDES: 64 mg/dL (ref <150)
Total CHOL/HDL Ratio: 4.1 RATIO
VLDL: 13 mg/dL (ref 0–40)

## 2015-01-08 LAB — TSH: TSH: 1.346 u[IU]/mL (ref 0.350–4.500)

## 2015-01-09 LAB — PSA, SERUM (SERIAL MONITOR): Prostate Specific Ag, Serum: <0.1 ng/mL (ref 0.0–4.0)

## 2016-05-04 ENCOUNTER — Ambulatory Visit: Payer: Commercial Managed Care - HMO | Admitting: Family Medicine

## 2016-06-02 DIAGNOSIS — F17200 Nicotine dependence, unspecified, uncomplicated: Secondary | ICD-10-CM | POA: Insufficient documentation

## 2016-06-02 DIAGNOSIS — Z8551 Personal history of malignant neoplasm of bladder: Secondary | ICD-10-CM | POA: Insufficient documentation

## 2016-06-02 DIAGNOSIS — N189 Chronic kidney disease, unspecified: Secondary | ICD-10-CM | POA: Insufficient documentation

## 2016-06-02 DIAGNOSIS — N1832 Chronic kidney disease, stage 3b: Secondary | ICD-10-CM | POA: Insufficient documentation

## 2016-06-22 ENCOUNTER — Other Ambulatory Visit: Payer: Self-pay | Admitting: Internal Medicine

## 2016-06-22 ENCOUNTER — Telehealth: Payer: Self-pay | Admitting: *Deleted

## 2016-06-22 DIAGNOSIS — N183 Chronic kidney disease, stage 3 unspecified: Secondary | ICD-10-CM

## 2016-06-22 DIAGNOSIS — R911 Solitary pulmonary nodule: Secondary | ICD-10-CM

## 2016-06-22 NOTE — Telephone Encounter (Signed)
Received referral for initial lung cancer screening scan. Contacted patient and obtained smoking history,(current, 75 pack year) as well as answering questions related to screening process. Patient denies signs of lung cancer such as weight loss or hemoptysis. Patient denies comorbidity that would prevent curative treatment if lung cancer were found. Patient is tentatively scheduled for shared decision making visit and CT scan on 06/29/16 at 1:30pm, pending insurance approval from business office.

## 2016-06-28 ENCOUNTER — Other Ambulatory Visit: Payer: Self-pay | Admitting: *Deleted

## 2016-06-28 DIAGNOSIS — Z87891 Personal history of nicotine dependence: Secondary | ICD-10-CM

## 2016-06-29 ENCOUNTER — Encounter: Payer: Self-pay | Admitting: Oncology

## 2016-06-29 ENCOUNTER — Inpatient Hospital Stay: Payer: Medicare HMO | Attending: Oncology | Admitting: Oncology

## 2016-06-29 ENCOUNTER — Ambulatory Visit: Admission: RE | Admit: 2016-06-29 | Payer: Medicare HMO | Source: Ambulatory Visit

## 2016-06-29 ENCOUNTER — Ambulatory Visit
Admission: RE | Admit: 2016-06-29 | Discharge: 2016-06-29 | Disposition: A | Discharge status: Home / Self Care | Payer: Medicare HMO | Source: Ambulatory Visit | Attending: Internal Medicine | Admitting: Internal Medicine

## 2016-06-29 DIAGNOSIS — I7090 Unspecified atherosclerosis: Secondary | ICD-10-CM | POA: Diagnosis not present

## 2016-06-29 DIAGNOSIS — K8020 Calculus of gallbladder without cholecystitis without obstruction: Secondary | ICD-10-CM | POA: Insufficient documentation

## 2016-06-29 DIAGNOSIS — Z87891 Personal history of nicotine dependence: Secondary | ICD-10-CM

## 2016-06-29 DIAGNOSIS — I701 Atherosclerosis of renal artery: Secondary | ICD-10-CM | POA: Diagnosis not present

## 2016-06-29 DIAGNOSIS — N183 Chronic kidney disease, stage 3 unspecified: Secondary | ICD-10-CM

## 2016-06-29 DIAGNOSIS — D3501 Benign neoplasm of right adrenal gland: Secondary | ICD-10-CM | POA: Insufficient documentation

## 2016-06-29 DIAGNOSIS — J432 Centrilobular emphysema: Secondary | ICD-10-CM | POA: Insufficient documentation

## 2016-06-29 DIAGNOSIS — I700 Atherosclerosis of aorta: Secondary | ICD-10-CM | POA: Diagnosis not present

## 2016-06-29 DIAGNOSIS — Z122 Encounter for screening for malignant neoplasm of respiratory organs: Secondary | ICD-10-CM

## 2016-06-29 DIAGNOSIS — K760 Fatty (change of) liver, not elsewhere classified: Secondary | ICD-10-CM | POA: Insufficient documentation

## 2016-06-29 HISTORY — DX: Essential (primary) hypertension: I10

## 2016-06-29 HISTORY — DX: Malignant neoplasm of bladder, unspecified: C67.9

## 2016-06-29 LAB — POCT I-STAT CREATININE: Creatinine, Ser: 1.9 mg/dL — ABNORMAL HIGH (ref 0.61–1.24)

## 2016-06-29 MED ORDER — IOPAMIDOL (ISOVUE-300) INJECTION 61%: 100.0000 mL | Freq: Once | INTRAVENOUS | Status: DC | PRN

## 2016-06-29 MED ORDER — IOPAMIDOL (ISOVUE-300) INJECTION 61%
75.0000 mL | Freq: Once | INTRAVENOUS | Status: AC | PRN
  Administered 2016-06-29: 75 mL via INTRAVENOUS

## 2016-06-30 ENCOUNTER — Encounter: Payer: Self-pay | Admitting: *Deleted

## 2016-06-30 DIAGNOSIS — Z87891 Personal history of nicotine dependence: Secondary | ICD-10-CM | POA: Insufficient documentation

## 2016-06-30 NOTE — Progress Notes (Signed)
In accordance with CMS guidelines, patient has met eligibility criteria including age, absence of signs or symptoms of lung cancer.  Social History  Substance Use Topics  . Smoking status: Current Every Day Smoker    Packs/day: 1.25    Years: 50.00    Types: Cigarettes  . Smokeless tobacco: Not on file  . Alcohol use Not on file     A shared decision-making session was conducted prior to the performance of CT scan. This includes one or more decision aids, includes benefits and harms of screening, follow-up diagnostic testing, over-diagnosis, false positive rate, and total radiation exposure.  Counseling on the importance of adherence to annual lung cancer LDCT screening, impact of co-morbidities, and ability or willingness to undergo diagnosis and treatment is imperative for compliance of the program.  Counseling on the importance of continued smoking cessation for former smokers; the importance of smoking cessation for current smokers, and information about tobacco cessation interventions have been given to patient including [place] and 1800 quit  programs.  Written order for lung cancer screening with LDCT has been given to the patient and any and all questions have been answered to the best of my abilities.   Yearly follow up will be coordinated by Burgess Estelle, Thoracic Navigator.

## 2016-07-06 ENCOUNTER — Inpatient Hospital Stay: Admission: RE | Admit: 2016-07-06 | Payer: PRIVATE HEALTH INSURANCE | Source: Ambulatory Visit

## 2016-09-09 ENCOUNTER — Encounter (INDEPENDENT_AMBULATORY_CARE_PROVIDER_SITE_OTHER): Payer: Medicare HMO | Admitting: Vascular Surgery

## 2016-12-23 ENCOUNTER — Other Ambulatory Visit: Payer: Self-pay | Admitting: Internal Medicine

## 2016-12-23 DIAGNOSIS — R1011 Right upper quadrant pain: Secondary | ICD-10-CM

## 2016-12-27 ENCOUNTER — Ambulatory Visit
Admission: RE | Admit: 2016-12-27 | Discharge: 2016-12-27 | Disposition: A | Discharge status: Home / Self Care | Payer: Medicare HMO | Source: Ambulatory Visit | Attending: Internal Medicine | Admitting: Internal Medicine

## 2016-12-27 DIAGNOSIS — R1011 Right upper quadrant pain: Secondary | ICD-10-CM | POA: Insufficient documentation

## 2016-12-27 DIAGNOSIS — K8020 Calculus of gallbladder without cholecystitis without obstruction: Secondary | ICD-10-CM | POA: Insufficient documentation

## 2016-12-27 DIAGNOSIS — N281 Cyst of kidney, acquired: Secondary | ICD-10-CM | POA: Diagnosis not present

## 2017-01-24 ENCOUNTER — Ambulatory Visit (INDEPENDENT_AMBULATORY_CARE_PROVIDER_SITE_OTHER): Payer: Medicare HMO | Admitting: Vascular Surgery

## 2017-01-24 ENCOUNTER — Encounter (INDEPENDENT_AMBULATORY_CARE_PROVIDER_SITE_OTHER): Payer: Self-pay | Admitting: Vascular Surgery

## 2017-01-24 DIAGNOSIS — I70213 Atherosclerosis of native arteries of extremities with intermittent claudication, bilateral legs: Secondary | ICD-10-CM

## 2017-01-24 DIAGNOSIS — I10 Essential (primary) hypertension: Secondary | ICD-10-CM | POA: Diagnosis not present

## 2017-01-24 DIAGNOSIS — M79604 Pain in right leg: Secondary | ICD-10-CM | POA: Diagnosis not present

## 2017-01-24 DIAGNOSIS — E785 Hyperlipidemia, unspecified: Secondary | ICD-10-CM | POA: Diagnosis not present

## 2017-01-24 DIAGNOSIS — M79605 Pain in left leg: Secondary | ICD-10-CM

## 2017-01-25 DIAGNOSIS — I10 Essential (primary) hypertension: Secondary | ICD-10-CM | POA: Insufficient documentation

## 2017-01-25 DIAGNOSIS — M79606 Pain in leg, unspecified: Secondary | ICD-10-CM

## 2017-01-25 DIAGNOSIS — I70219 Atherosclerosis of native arteries of extremities with intermittent claudication, unspecified extremity: Secondary | ICD-10-CM | POA: Insufficient documentation

## 2017-01-25 DIAGNOSIS — E785 Hyperlipidemia, unspecified: Secondary | ICD-10-CM | POA: Insufficient documentation

## 2017-01-25 HISTORY — DX: Pain in leg, unspecified: M79.606

## 2017-01-25 NOTE — Progress Notes (Signed)
MRN : [id number]  Martin Jennings is a 66 y.o. (1950/11/03) male who presents with chief complaint of  Chief Complaint  Patient presents with  . New Evaluation    PVD  .  History of Present Illness:  The patient is seen for evaluation of painful lower extremities and diminished pulses. Patient notes the pain is always associated with activity and is very consistent day today. Typically, the pain occurs at less than one block, progress is as activity continues to the point that the patient must stop walking. Resting including standing still for several minutes allowed resumption of the activity and the ability to walk a similar distance before stopping again. Uneven terrain and inclined shorten the distance. The pain has been progressive over the past several years. The patient states the inability to walk is now having a profound negative impact on quality of life and daily activities.  He is status post iliac stenting in the past subsequently the left stents occluded. He presented approximate 4 years ago to my service with gangrene of the left great toe and underwent restenting on the right with a femoral-femoral bypass right to left. He did not return for follow-up.  The patient denies rest pain or dangling of an extremity off the side of the bed during the night for relief. No open wounds or sores at this time.  No history of back problems or DJD of the lumbar sacral spine.   The patient denies changes in claudication symptoms or new rest pain symptoms.  No new ulcers or wounds of the foot.  The patient's blood pressure has been stable and relatively well controlled. The patient denies amaurosis fugax or recent TIA symptoms. There are no recent neurological changes noted. The patient denies history of DVT, PE or superficial thrombophlebitis. The patient denies recent episodes of angina or shortness of breath.   Current Meds  Medication Sig  . amLODipine (NORVASC) 5 MG tablet   .  aspirin EC 81 MG tablet Take 81 mg by mouth.  . budesonide-formoterol (SYMBICORT) 160-4.5 MCG/ACT inhaler Inhale 2 puffs into the lungs 2 (two) times daily.  . carvedilol (COREG) 6.25 MG tablet   . clopidogrel (PLAVIX) 75 MG tablet Take by mouth.  Marland Kitchen lisinopril (PRINIVIL,ZESTRIL) 40 MG tablet   . varenicline (CHANTIX STARTING MONTH PAK) 0.5 MG X 11 & 1 MG X 42 tablet Take by mouth.    Past Medical History:  Diagnosis Date  . Bladder cancer ([place]) 2012  . Hypertension     No past surgical history on file.  Social History Social History  Substance Use Topics  . Smoking status: Current Every Day Smoker    Packs/day: 1.25    Years: 50.00    Types: Cigarettes  . Smokeless tobacco: Never Used  . Alcohol use No    Family History No family history on file. No family history of bleeding/clotting disorders, porphyria or autoimmune disease   No Known Allergies   REVIEW OF SYSTEMS (Negative unless checked)  Constitutional: [] Weight loss  [] Fever  [] Chills Cardiac: [] Chest pain   [] Chest pressure   [] Palpitations   [] Shortness of breath when laying flat   [x] Shortness of breath with exertion. Vascular:  [x] Pain in legs with walking   [] Pain in legs at rest  [] History of DVT   [] Phlebitis   [x] Swelling in legs   [] Varicose veins   [] Non-healing ulcers Pulmonary:   [] Uses home oxygen   [] Productive cough   [] Hemoptysis   [] Wheeze  [  x]COPD   [] Asthma Neurologic:  [] Dizziness   [] Seizures   [] History of stroke   [] History of TIA  [] Aphasia   [] Vissual changes   [] Weakness or numbness in arm   [] Weakness or numbness in leg Musculoskeletal:   [] Joint swelling   [x] Joint pain   [] Low back pain Hematologic:  [] Easy bruising  [] Easy bleeding   [] Hypercoagulable state   [] Anemic Gastrointestinal:  [] Diarrhea   [] Vomiting  [] Gastroesophageal reflux/heartburn   [] Difficulty swallowing. Genitourinary:  [] Chronic kidney disease   [] Difficult urination  [] Frequent urination   [] Blood in urine Skin:   [] Rashes   [] Ulcers  Psychological:  [] History of anxiety   []  History of major depression.  Physical Examination  Vitals:   01/24/17 1008  BP: (!) 142/81  Pulse: 65  Resp: 16  Weight: 179 lb (81.2 kg)  Height: 5\' 11"  (1.803 m)   Body mass index is 24.97 kg/m. Gen: WD/WN, NAD Head: [place]/AT, No temporalis wasting.  Ear/Nose/Throat: Hearing grossly intact, nares w/o erythema or drainage, poor dentition Eyes: PER, EOMI, sclera nonicteric.  Neck: Supple, no masses.  No bruit or JVD.  Pulmonary:  Good air movement, clear to auscultation bilaterally, no use of accessory muscles.  Cardiac: RRR, normal S1, S2, no Murmurs. Vascular: Feet are cool to the touch there is 2 second capillary refill. No open wounds or sores. There are mild venous stasis changes to the skin with 1+ pitting edema  Vessel Right Left  Radial Palpable Palpable  Ulnar Palpable Palpable  Brachial Palpable Palpable  Carotid Palpable Palpable  Femoral Palpable Palpable  Popliteal Not Palpable Not Palpable  PT Not Palpable Not Palpable  DP Not Palpable Not Palpable   Gastrointestinal: soft, non-distended. No guarding/no peritoneal signs.  Musculoskeletal: M/S 5/5 throughout.  No deformity or atrophy.  Neurologic: CN 2-12 intact. Pain and light touch intact in extremities.  Symmetrical.  Speech is fluent. Motor exam as listed above. Psychiatric: Judgment intact, Mood & affect appropriate for pt's clinical situation. Dermatologic: venous rashes no ulcers noted.  No changes consistent with cellulitis. Lymph : No Cervical lymphadenopathy, no lichenification or skin changes of chronic lymphedema.  CBC Lab Results  Component Value Date   WBC 7.8 01/08/2015   HGB 17.2 01/08/2015   HCT 52.2 (H) 01/08/2015   MCV 94.8 01/08/2015   PLT 173 01/08/2015    BMET    Component Value Date/Time   NA 138 01/08/2015 1151   NA 140 07/12/2013 1421   K 4.2 01/08/2015 1151   K 4.0 07/12/2013 1421   CL 108 01/08/2015 1151   CL  111 (H) 07/12/2013 1421   CO2 24 01/08/2015 1151   CO2 25 07/12/2013 1421   GLUCOSE 108 (H) 01/08/2015 1151   GLUCOSE 108 (H) 07/12/2013 1421   BUN 23 (H) 01/08/2015 1151   BUN 29 (H) 07/12/2013 1421   CREATININE 1.90 (H) 06/29/2016 1454   CREATININE 1.95 (H) 07/12/2013 1421   CALCIUM 9.1 01/08/2015 1151   CALCIUM 9.0 07/12/2013 1421   GFRNONAA 39 (L) 01/08/2015 1151   GFRNONAA 36 (L) 07/12/2013 1421   GFRAA 45 (L) 01/08/2015 1151   GFRAA 42 (L) 07/12/2013 1421   CrCl cannot be calculated (Patient's most recent lab result is older than the maximum 21 days allowed.).  COAG Lab Results  Component Value Date   INR 1.1 03/15/2013    Radiology US Abdomen Complete  Result Date: 12/27/2016 CLINICAL DATA:  Right upper quadrant abdominal pain and fever for the past week. History  of gallstones. EXAM: ABDOMEN ULTRASOUND COMPLETE COMPARISON:  Abdominal and pelvic CT scan of June 29, 2016 FINDINGS: Gallbladder: The gallbladder is adequately distended. There is an echogenic mobile stone measuring 8 mm in diameter. There is no gallbladder wall thickening, pericholecystic fluid, or positive sonographic Murphy's sign. Common bile duct: Diameter: 2 mm Liver: No focal lesion identified. Within normal limits in parenchymal echogenicity. IVC: No abnormality visualized. Pancreas: Bowel gas limits visualization of the pancreas. Spleen: Size and appearance within normal limits. Right Kidney: Length: 12.4 cm. The renal cortical echotexture is approximately equal to that of the adjacent liver. There is a lower pole cyst measuring 1.7 cm in greatest dimension. There is no hydronephrosis. Left Kidney: Length: There is mural plaque with maximal diameter measured at 2.7 cm proximally. The renal cortical echotexture is mildly increased similar to that on the right. There is an exophytic upper pole cyst measuring 1.2 cm in greatest dimension. There is no hydronephrosis. Abdominal aorta: Maximal diameter is 2.7 cm with  a normal tapering caliber. There is mural plaque. Other findings: There is no ascites. IMPRESSION: Gallstones without sonographic evidence of acute cholecystitis. Normal appearance of the liver and common bile duct. Normal appearance of the liver, pancreas, and spleen. Mildly increased renal cortical echotexture bilaterally may reflect medical renal disease. Electronically Signed   By: David  Martinique M.D.   On: 12/27/2016 08:42     Assessment/Plan 1. Atherosclerosis of native artery of both lower extremities with intermittent claudication (HCC) Recommend:  The patient has a history of extensive vascular procedures including iliac stenting and urgent femoral to femoral bypass for treatment of a gangrenous left great toe. This was done in 2014 and he has been lost to follow-up since. He is not complaining of increasing pain in his legs particularly with ambulation.  Patient should undergo arterial duplex of the lower extremity given the patient's lower extremity symptoms.  The patient states they are having increased pain and a marked decrease in the distance that they can walk.  The risks and benefits as well as the alternatives were discussed in detail with the patient.  All questions were answered.  Patient agrees to proceed and understands this could be a prelude to angiography and intervention.  The patient will follow up with me in the office to review the studies.    A total of 65 minutes was spent with this patient and greater than 50% was spent in counseling and coordination of care with the patient.  Discussion included the treatment options for vascular disease including indications for surgery and intervention.  Also discussed is the appropriate timing of treatment.  In addition medical therapy was discussed.  - VAS US AORTA/IVC/ILIACS; Future - VAS [place] ABI WITH/WO TBI; Future  2. Pain in both lower extremities See #1  3. Essential hypertension Continue antihypertensive medications  as already ordered, these medications have been reviewed and there are no changes at this time.   4. Hyperlipidemia, unspecified hyperlipidemia type Continue statin as ordered and reviewed, no changes at this time     Hortencia Pilar, MD  01/25/2017 9:20 AM

## 2017-02-04 ENCOUNTER — Encounter (INDEPENDENT_AMBULATORY_CARE_PROVIDER_SITE_OTHER): Payer: Self-pay

## 2017-03-02 ENCOUNTER — Encounter (INDEPENDENT_AMBULATORY_CARE_PROVIDER_SITE_OTHER): Payer: Medicare HMO

## 2017-03-24 ENCOUNTER — Ambulatory Visit (INDEPENDENT_AMBULATORY_CARE_PROVIDER_SITE_OTHER): Payer: Medicare HMO

## 2017-03-24 DIAGNOSIS — I70213 Atherosclerosis of native arteries of extremities with intermittent claudication, bilateral legs: Secondary | ICD-10-CM

## 2017-03-31 ENCOUNTER — Encounter (INDEPENDENT_AMBULATORY_CARE_PROVIDER_SITE_OTHER): Payer: Self-pay | Admitting: Vascular Surgery

## 2017-03-31 ENCOUNTER — Ambulatory Visit (INDEPENDENT_AMBULATORY_CARE_PROVIDER_SITE_OTHER): Payer: Medicare HMO | Admitting: Vascular Surgery

## 2017-03-31 VITALS — BP 127/74 | HR 73 | Resp 17 | Ht 72.0 in | Wt 186.0 lb

## 2017-03-31 DIAGNOSIS — I10 Essential (primary) hypertension: Secondary | ICD-10-CM

## 2017-03-31 DIAGNOSIS — M79604 Pain in right leg: Secondary | ICD-10-CM | POA: Diagnosis not present

## 2017-03-31 DIAGNOSIS — I70222 Atherosclerosis of native arteries of extremities with rest pain, left leg: Secondary | ICD-10-CM

## 2017-03-31 DIAGNOSIS — E785 Hyperlipidemia, unspecified: Secondary | ICD-10-CM

## 2017-03-31 DIAGNOSIS — M79605 Pain in left leg: Secondary | ICD-10-CM

## 2017-04-01 ENCOUNTER — Other Ambulatory Visit (INDEPENDENT_AMBULATORY_CARE_PROVIDER_SITE_OTHER): Payer: Self-pay

## 2017-04-01 ENCOUNTER — Encounter (INDEPENDENT_AMBULATORY_CARE_PROVIDER_SITE_OTHER): Payer: Self-pay

## 2017-04-01 MED ORDER — DEXTROSE 5 % IV SOLN: 2.0000 g | Freq: Once | INTRAVENOUS | Status: DC

## 2017-04-03 DIAGNOSIS — I70229 Atherosclerosis of native arteries of extremities with rest pain, unspecified extremity: Secondary | ICD-10-CM | POA: Insufficient documentation

## 2017-04-03 NOTE — Progress Notes (Signed)
MRN : [id number]  Martin Jennings is a 66 y.o. ([birth year]) male who presents with chief complaint of  Chief Complaint  Patient presents with  . Follow-up    Ultrasound f/u  .  History of Present Illness: The patient returns to the office for followup and review of the noninvasive studies. There has been a significant deterioration in the lower extremity symptoms.  The patient notes interval shortening of their claudication distance and development of mild rest pain symptoms. No new ulcers or wounds have occurred since the last visit.  There have been no significant changes to the patient's overall health care.  The patient denies amaurosis fugax or recent TIA symptoms. There are no recent neurological changes noted. The patient denies history of DVT, PE or superficial thrombophlebitis. The patient denies recent episodes of angina or shortness of breath.    Current Meds  Medication Sig  . amLODipine (NORVASC) 10 MG tablet   . aspirin EC 81 MG tablet Take 81 mg by mouth.  Marland Kitchen atorvastatin (LIPITOR) 20 MG tablet   . budesonide-formoterol (SYMBICORT) 160-4.5 MCG/ACT inhaler Inhale 2 puffs into the lungs 2 (two) times daily.  . carvedilol (COREG) 6.25 MG tablet   . clopidogrel (PLAVIX) 75 MG tablet Take by mouth.  Marland Kitchen lisinopril (PRINIVIL,ZESTRIL) 40 MG tablet    Current Facility-Administered Medications for the 03/31/17 encounter (Office Visit) with Delana Meyer, Dolores Lory, MD  Medication  . ceFAZolin (ANCEF) 2 g in dextrose 5 % 100 mL IVPB    Past Medical History:  Diagnosis Date  . Bladder cancer ([place]) 2012  . Hypertension     No past surgical history on file.  Social History Social History  Substance Use Topics  . Smoking status: Current Every Day Smoker    Packs/day: 1.25    Years: 50.00    Types: Cigarettes  . Smokeless tobacco: Never Used  . Alcohol use No    Family History No family history on file.  No Known Allergies   REVIEW OF SYSTEMS (Negative unless  checked)  Constitutional: [] Weight loss  [] Fever  [] Chills Cardiac: [] Chest pain   [] Chest pressure   [] Palpitations   [] Shortness of breath when laying flat   [] Shortness of breath with exertion. Vascular:  [x] Pain in legs with walking   [x] Pain in legs at rest  [] History of DVT   [] Phlebitis   [] Swelling in legs   [] Varicose veins   [] Non-healing ulcers Pulmonary:   [] Uses home oxygen   [] Productive cough   [] Hemoptysis   [] Wheeze  [] COPD   [] Asthma Neurologic:  [] Dizziness   [] Seizures   [] History of stroke   [] History of TIA  [] Aphasia   [] Vissual changes   [] Weakness or numbness in arm   [] Weakness or numbness in leg Musculoskeletal:   [] Joint swelling   [] Joint pain   [] Low back pain Hematologic:  [] Easy bruising  [] Easy bleeding   [] Hypercoagulable state   [] Anemic Gastrointestinal:  [] Diarrhea   [] Vomiting  [] Gastroesophageal reflux/heartburn   [] Difficulty swallowing. Genitourinary:  [] Chronic kidney disease   [] Difficult urination  [] Frequent urination   [] Blood in urine Skin:  [] Rashes   [] Ulcers  Psychological:  [] History of anxiety   []  History of major depression.  Physical Examination  Vitals:   03/31/17 1649  BP: 127/74  Pulse: 73  Resp: 17  Weight: 84.4 kg (186 lb)  Height: 6' (1.829 m)   Body mass index is 25.23 kg/m. Gen: WD/WN, NAD Head: [place]/AT, No temporalis wasting.  Ear/Nose/Throat: Hearing  grossly intact, nares w/o erythema or drainage Eyes: PER, EOMI, sclera nonicteric.  Neck: Supple, no large masses.   Pulmonary:  Good air movement, no audible wheezing bilaterally, no use of accessory muscles.  Cardiac: RRR, no JVD Vascular:  Vessel Right Left  Radial Palpable Palpable  PT Not Palpable Not Palpable  DP Not Palpable Not Palpable  Gastrointestinal: Non-distended. No guarding/no peritoneal signs.  Musculoskeletal: M/S 5/5 throughout.  No deformity or atrophy.  Neurologic: CN 2-12 intact. Symmetrical.  Speech is fluent. Motor exam as listed  above. Psychiatric: Judgment intact, Mood & affect appropriate for pt's clinical situation. Dermatologic: No rashes or ulcers noted.  No changes consistent with cellulitis. Lymph : No lichenification or skin changes of chronic lymphedema.  CBC Lab Results  Component Value Date   WBC 7.8 01/08/2015   HGB 17.2 01/08/2015   HCT 52.2 (H) 01/08/2015   MCV 94.8 01/08/2015   PLT 173 01/08/2015    BMET    Component Value Date/Time   NA 138 01/08/2015 1151   NA 140 07/12/2013 1421   K 4.2 01/08/2015 1151   K 4.0 07/12/2013 1421   CL 108 01/08/2015 1151   CL 111 (H) 07/12/2013 1421   CO2 24 01/08/2015 1151   CO2 25 07/12/2013 1421   GLUCOSE 108 (H) 01/08/2015 1151   GLUCOSE 108 (H) 07/12/2013 1421   BUN 23 (H) 01/08/2015 1151   BUN 29 (H) 07/12/2013 1421   CREATININE 1.90 (H) 06/29/2016 1454   CREATININE 1.95 (H) 07/12/2013 1421   CALCIUM 9.1 01/08/2015 1151   CALCIUM 9.0 07/12/2013 1421   GFRNONAA 39 (L) 01/08/2015 1151   GFRNONAA 36 (L) 07/12/2013 1421   GFRAA 45 (L) 01/08/2015 1151   GFRAA 42 (L) 07/12/2013 1421   CrCl cannot be calculated (Patient's most recent lab result is older than the maximum 21 days allowed.).  COAG Lab Results  Component Value Date   INR 1.1 03/15/2013    Radiology No results found.  Assessment/Plan 1. Atherosclerosis of native artery of left lower extremity with rest pain (HCC) Recommend:  The patient has evidence of severe atherosclerotic changes of both lower extremities with rest pain that is associated with preulcerative changes and impending tissue loss of the left foot.  This represents a limb threatening ischemia and places the patient at the risk for limb loss.  Patient should undergo angiography of the left lower extremity with the hope for intervention for limb salvage.  The risks and benefits as well as the alternative therapies was discussed in detail with the patient.  All questions were answered.  Patient agrees to proceed with  angiography.  The patient will follow up with me in the office after the procedure.     2. Hyperlipidemia, unspecified hyperlipidemia type Continue statin as ordered and reviewed, no changes at this time   3. Pain in both lower extremities See number 1  4. Essential hypertension Continue antihypertensive medications as already ordered, these medications have been reviewed and there are no changes at this time.     Hortencia Pilar, MD  04/03/2017 4:27 PM

## 2017-04-11 ENCOUNTER — Encounter
Admission: RE | Admit: 2017-04-11 | Discharge: 2017-04-11 | Disposition: A | Discharge status: Home / Self Care | Payer: Medicare HMO | Source: Ambulatory Visit | Attending: Vascular Surgery | Admitting: Vascular Surgery

## 2017-04-11 DIAGNOSIS — E785 Hyperlipidemia, unspecified: Secondary | ICD-10-CM | POA: Diagnosis not present

## 2017-04-11 DIAGNOSIS — T82868A Thrombosis of vascular prosthetic devices, implants and grafts, initial encounter: Secondary | ICD-10-CM | POA: Diagnosis not present

## 2017-04-11 DIAGNOSIS — I70222 Atherosclerosis of native arteries of extremities with rest pain, left leg: Secondary | ICD-10-CM | POA: Diagnosis not present

## 2017-04-11 DIAGNOSIS — F17210 Nicotine dependence, cigarettes, uncomplicated: Secondary | ICD-10-CM | POA: Diagnosis not present

## 2017-04-11 DIAGNOSIS — I10 Essential (primary) hypertension: Secondary | ICD-10-CM | POA: Diagnosis not present

## 2017-04-11 DIAGNOSIS — Y832 Surgical operation with anastomosis, bypass or graft as the cause of abnormal reaction of the patient, or of later complication, without mention of misadventure at the time of the procedure: Secondary | ICD-10-CM | POA: Diagnosis not present

## 2017-04-11 DIAGNOSIS — Z8551 Personal history of malignant neoplasm of bladder: Secondary | ICD-10-CM | POA: Diagnosis not present

## 2017-04-11 HISTORY — DX: Peripheral vascular disease, unspecified: I73.9

## 2017-04-11 LAB — CREATININE, SERUM
Creatinine, Ser: 1.65 mg/dL — ABNORMAL HIGH (ref 0.61–1.24)
GFR calc Af Amer: 48 mL/min — ABNORMAL LOW (ref >60)
GFR calc non Af Amer: 42 mL/min — ABNORMAL LOW (ref >60)

## 2017-04-11 LAB — BUN: BUN: 20 mg/dL (ref 6–20)

## 2017-04-12 ENCOUNTER — Encounter
Admission: RE | Disposition: A | Discharge status: Home / Self Care | Payer: Self-pay | Source: Ambulatory Visit | Attending: Vascular Surgery

## 2017-04-12 ENCOUNTER — Encounter: Payer: Self-pay | Admitting: Vascular Surgery

## 2017-04-12 ENCOUNTER — Ambulatory Visit
Admission: RE | Admit: 2017-04-12 | Discharge: 2017-04-12 | Disposition: A | Discharge status: Home / Self Care | Payer: Medicare HMO | Source: Ambulatory Visit | Attending: Vascular Surgery | Admitting: Vascular Surgery

## 2017-04-12 DIAGNOSIS — T82868A Thrombosis of vascular prosthetic devices, implants and grafts, initial encounter: Secondary | ICD-10-CM | POA: Insufficient documentation

## 2017-04-12 DIAGNOSIS — I70211 Atherosclerosis of native arteries of extremities with intermittent claudication, right leg: Secondary | ICD-10-CM | POA: Diagnosis not present

## 2017-04-12 DIAGNOSIS — Z8551 Personal history of malignant neoplasm of bladder: Secondary | ICD-10-CM | POA: Insufficient documentation

## 2017-04-12 DIAGNOSIS — E785 Hyperlipidemia, unspecified: Secondary | ICD-10-CM | POA: Insufficient documentation

## 2017-04-12 DIAGNOSIS — F17210 Nicotine dependence, cigarettes, uncomplicated: Secondary | ICD-10-CM | POA: Insufficient documentation

## 2017-04-12 DIAGNOSIS — I70222 Atherosclerosis of native arteries of extremities with rest pain, left leg: Secondary | ICD-10-CM | POA: Insufficient documentation

## 2017-04-12 DIAGNOSIS — Y832 Surgical operation with anastomosis, bypass or graft as the cause of abnormal reaction of the patient, or of later complication, without mention of misadventure at the time of the procedure: Secondary | ICD-10-CM | POA: Insufficient documentation

## 2017-04-12 DIAGNOSIS — I10 Essential (primary) hypertension: Secondary | ICD-10-CM | POA: Insufficient documentation

## 2017-04-12 HISTORY — PX: LOWER EXTREMITY ANGIOGRAPHY: CATH118251

## 2017-04-12 SURGERY — LOWER EXTREMITY ANGIOGRAPHY
Anesthesia: Moderate Sedation | Laterality: Left

## 2017-04-12 MED ORDER — FENTANYL CITRATE (PF) 100 MCG/2ML IJ SOLN
INTRAMUSCULAR | Status: DC | PRN
  Administered 2017-04-12 (×4): 25 ug via INTRAVENOUS
  Administered 2017-04-12: 50 ug via INTRAVENOUS
  Administered 2017-04-12: 25 ug via INTRAVENOUS

## 2017-04-12 MED ORDER — HEPARIN (PORCINE) IN NACL 2-0.9 UNIT/ML-% IJ SOLN
INTRAMUSCULAR | Status: AC
  Filled 2017-04-12: qty 1000

## 2017-04-12 MED ORDER — CEFAZOLIN SODIUM-DEXTROSE 2-4 GM/100ML-% IV SOLN
2.0000 g | Freq: Once | INTRAVENOUS | Status: AC
Start: 2017-04-12 — End: 2017-04-12
  Administered 2017-04-12: 2 g via INTRAVENOUS

## 2017-04-12 MED ORDER — SODIUM CHLORIDE 0.9 % IV BOLUS (SEPSIS)
250.0000 mL | Freq: Once | INTRAVENOUS | Status: AC
  Administered 2017-04-12: 250 mL via INTRAVENOUS

## 2017-04-12 MED ORDER — IOPAMIDOL (ISOVUE-300) INJECTION 61%
INTRAVENOUS | Status: DC | PRN
  Administered 2017-04-12: 85 mL via INTRAVENOUS

## 2017-04-12 MED ORDER — MORPHINE SULFATE (PF) 4 MG/ML IV SOLN: 2.0000 mg | INTRAVENOUS | Status: DC | PRN

## 2017-04-12 MED ORDER — HEPARIN SODIUM (PORCINE) 1000 UNIT/ML IJ SOLN
INTRAMUSCULAR | Status: AC
  Filled 2017-04-12: qty 1

## 2017-04-12 MED ORDER — SODIUM CHLORIDE 0.9 % IV SOLN
INTRAVENOUS | Status: DC
  Administered 2017-04-12: 12:00:00 via INTRAVENOUS

## 2017-04-12 MED ORDER — FENTANYL CITRATE (PF) 100 MCG/2ML IJ SOLN
INTRAMUSCULAR | Status: AC
  Filled 2017-04-12: qty 2

## 2017-04-12 MED ORDER — ALTEPLASE 2 MG IJ SOLR
INTRAMUSCULAR | Status: DC | PRN
  Administered 2017-04-12: 10 mg

## 2017-04-12 MED ORDER — MIDAZOLAM HCL 5 MG/5ML IJ SOLN
INTRAMUSCULAR | Status: AC
  Filled 2017-04-12: qty 5

## 2017-04-12 MED ORDER — LIDOCAINE HCL (PF) 1 % IJ SOLN
INTRAMUSCULAR | Status: AC
  Filled 2017-04-12: qty 30

## 2017-04-12 MED ORDER — HEPARIN SODIUM (PORCINE) 1000 UNIT/ML IJ SOLN
INTRAMUSCULAR | Status: DC | PRN
  Administered 2017-04-12: 5000 [IU] via INTRAVENOUS

## 2017-04-12 MED ORDER — ALTEPLASE 2 MG IJ SOLR
INTRAMUSCULAR | Status: AC
  Filled 2017-04-12: qty 10

## 2017-04-12 MED ORDER — MIDAZOLAM HCL 2 MG/2ML IJ SOLN
INTRAMUSCULAR | Status: DC | PRN
  Administered 2017-04-12 (×2): 0.5 mg via INTRAVENOUS
  Administered 2017-04-12: 1 mg via INTRAVENOUS
  Administered 2017-04-12: 0.5 mg via INTRAVENOUS
  Administered 2017-04-12: 2 mg via INTRAVENOUS
  Administered 2017-04-12: 0.5 mg via INTRAVENOUS

## 2017-04-12 MED ORDER — CEFAZOLIN SODIUM-DEXTROSE 1-4 GM/50ML-% IV SOLN
INTRAVENOUS | Status: AC
  Filled 2017-04-12: qty 100

## 2017-04-12 SURGICAL SUPPLY — 20 items
BALLN DORADO 6X40X80 (BALLOONS) ×2
BALLN LUTONIX DCB 7X60X130 (BALLOONS) ×4
BALLOON DORADO 6X40X80 (BALLOONS) ×1 IMPLANT
BALLOON LUTONIX DCB 7X60X130 (BALLOONS) ×2 IMPLANT
CANISTER PENUMBRA MAX (MISCELLANEOUS) ×2 IMPLANT
CATH BEACON 5 .035 40 KMP TP (CATHETERS) ×1 IMPLANT
CATH BEACON 5 .038 40 KMP TP (CATHETERS) ×1
CATH INDIGO 6 ST-TIP 135CM (CATHETERS) ×2 IMPLANT
CATH PIG 70CM (CATHETERS) ×2 IMPLANT
DEVICE CLOSURE MYNXGRIP 6/7F (Vascular Products) ×2 IMPLANT
DEVICE PRESTO INFLATION (MISCELLANEOUS) ×2 IMPLANT
GLIDEWIRE STIFF .35X180X3 HYDR (WIRE) ×2 IMPLANT
NEEDLE ENTRY 21GA 7CM ECHOTIP (NEEDLE) ×2 IMPLANT
PACK ANGIOGRAPHY (CUSTOM PROCEDURE TRAY) ×2 IMPLANT
SET INTRO CAPELLA COAXIAL (SET/KITS/TRAYS/PACK) ×2 IMPLANT
SHEATH BRITE TIP 5FRX11 (SHEATH) ×2 IMPLANT
SHEATH BRITE TIP 6FRX11 (SHEATH) ×2 IMPLANT
TUBING ASPIRATION INDIGO (MISCELLANEOUS) ×2 IMPLANT
WIRE J 3MM .035X145CM (WIRE) ×2 IMPLANT
WIRE SPARTACORE .014X300CM (WIRE) ×2 IMPLANT

## 2017-04-12 NOTE — Progress Notes (Signed)
Pt awake and alert, sitting in bed in NAD.  Pt eating and drinking w/o difficulty.  Pt denies any pain at this time.  Dr. Delana Meyer at bedside to discuss procedure with pt and family.  Discharge and follow up instructions reviewed with pt and family who verbalize understanding.  Return precautions reviewed and pt verbalizes understanding.

## 2017-04-12 NOTE — Discharge Instructions (Signed)
Groin Insertion Instructions-If you lose feeling or develop tingling or pain in your leg or foot after the procedure, please walk around first.  If the discomfort does not improve , contact your physician and proceed to the nearest emergency room.  Loss of feeling in your leg might mean that a blockage has formed in the artery and this can be appropriately treated.  Limit your activity for the next two days after your procedure.  Avoid stooping, bending, heavy lifting or exertion as this may put pressure on the insertion site.  Resume normal activities in 48 hours.  You may shower after 24 hours but avoid excessive warm water and do not scrub the site.  Remove clear dressing in 48 hours.  If you have had a closure device inserted, do not soak in a tub bath or a hot tub for at least one week. ° °No driving for 48 hours after discharge.  After the procedure, check the insertion site occasionally.  If any oozing occurs or there is apparent swelling, firm pressure over the site will prevent a bruise from forming.  You can not hurt anything by pressing directly on the site.  The pressure stops the bleeding by allowing a small clot to form.  If the bleeding continues after the pressure has been applied for more than 15 minutes, call 911 or go to the nearest emergency room.   ° °The x-ray dye causes you to pass a considerate amount of urine.  For this reason, you will be asked to drink plenty of liquids after the procedure to prevent dehydration.  You may resume you regular diet.  Avoid caffeine products.   ° °For pain at the site of your procedure, take non-aspirin medicines such as Tylenol. ° °Medications: A. Hold Metformin for 48 hours if applicable.  B. Continue taking all your present medications at home unless your doctor prescribes any changes. ° °Moderate Conscious Sedation, Adult, Care After °These instructions provide you with information about caring for yourself after your procedure. Your health care provider  may also give you more specific instructions. Your treatment has been planned according to current medical practices, but problems sometimes occur. Call your health care provider if you have any problems or questions after your procedure. °What can I expect after the procedure? °After your procedure, it is common: °· To feel sleepy for several hours. °· To feel clumsy and have poor balance for several hours. °· To have poor judgment for several hours. °· To vomit if you eat too soon. ° °Follow these instructions at home: °For at least 24 hours after the procedure: ° °· Do not: °? Participate in activities where you could fall or become injured. °? Drive. °? Use heavy machinery. °? Drink alcohol. °? Take sleeping pills or medicines that cause drowsiness. °? Make important decisions or sign legal documents. °? Take care of children on your own. °· Rest. °Eating and drinking °· Follow the diet recommended by your health care provider. °· If you vomit: °? Drink water, juice, or soup when you can drink without vomiting. °? Make sure you have little or no nausea before eating solid foods. °General instructions °· Have a responsible adult stay with you until you are awake and alert. °· Take over-the-counter and prescription medicines only as told by your health care provider. °· If you smoke, do not smoke without supervision. °· Keep all follow-up visits as told by your health care provider. This is important. °Contact a health care provider   if: °· You keep feeling nauseous or you keep vomiting. °· You feel light-headed. °· You develop a rash. °· You have a fever. °Get help right away if: °· You have trouble breathing. °This information is not intended to replace advice given to you by your health care provider. Make sure you discuss any questions you have with your health care provider. °Document Released: 05/02/2013 Document Revised: 12/15/2015 Document Reviewed: 11/01/2015 °Elsevier Interactive Patient Education © 2018  [employer] ° °

## 2017-04-12 NOTE — Progress Notes (Signed)
Patient here today for lower leg angiogram per Dr Delana Meyer. Attached to monitor and will follow vitals throughout entire procedure.

## 2017-04-12 NOTE — Op Note (Signed)
OPERATIVE NOTE   PROCEDURE: 1. left lower extremity angiography third order catheter placement 2. Ultrasound-guided access right superficial femoral artery for sheath placement 3. Mechanical thrombectomy femoral to femoral bypass graft 4. Percutaneous transluminal angioplasty to 7 mm using Lutonix drug eluding balloon right common femoral and femoral-femoral bypass graft 5. Percutaneous transluminal angioplasty to 7 mm using Lutonix drug eluding balloon left common femoral and femoral-femoral bypass graft    PRE-OPERATIVE DIAGNOSIS: Atherosclerotic occlusive disease bilateral lower extremities with rest pain of the left lower extremity;  Complication of vascular device with stricture of the left common femoral artery and graft anastomosis  POST-OPERATIVE DIAGNOSIS: Atherosclerotic occlusive disease bilateral lower extremities with rest pain of the left lower extremity;  Complication of vascular device with thrombosis of the femoral-femoral bypass   SURGEON: Katha Cabal, M.D.  ANESTHESIA: Conscious sedation was administered under my direct supervision by the interventional radiology RN. IV Versed plus fentanyl were utilized. Continuous ECG, pulse oximetry and blood pressure was monitored throughout the entire procedure.  Conscious sedation was for a total of 140 minutes.  ESTIMATED BLOOD LOSS: Minimal cc  FINDING(S): 1.  Diffuse atherosclerotic changes and rest pain of the left lower extremity associated with thrombosis of the femorofemoral graft  SPECIMEN(S):  None  INDICATIONS:   Martin Jennings is a 66 y.o. y.o. male who presents with signs of sepsis secondary to infection of the right heel ulcer. He has nonpalpable pulses in association with the rest pain of the left lower extremity.  DESCRIPTION: After obtaining full informed written consent, the patient was brought back to the operating room and placed supine upon the operating table.  The patient received IV antibiotics  prior to induction.  After obtaining adequate sedation, the patient was prepped and draped in the standard fashion and appropriate time out is called.    Ultrasound is placed in a sterile sleeve ultrasound as utilized secondary to lack of appropriate landmarks and to avoid vascular injury. Under real-time visualization the right superficial femoral artery is identified approximately 4 cm distal to the bifurcation and is echolucent and pulsatile indicating patency. Image is recorded for the permanent record. 1% lidocaine is then infiltrated into the soft tissues with ultrasound visualization. Microneedle is then inserted into the anterior wall of the common femoral artery under direct visualization with ultrasound. Microwire followed by micro-sheath.   J-wire followed by 6 French sheath is then inserted without difficulty.  J-wire is then advanced with pigtail catheter up to the level of T12 and AP projection of the aorta is obtained. Pigtail catheter was repositioned to above the bifurcation and oblique view of the pelvis is obtained.  Using a Stiff angled Glide Wire wire and KMP catheter the femorofemoral bypass was selected and successfully crossed and the catheter is advanced down to the SFA on the left. Oblique view of the femoral bifurcation is then obtained by hand injection. The wire is then reintroduced and the catheter is positioned within the SFA. AP projections of the left lower extremity are obtained for distal runoff.  Given the findings of thrombosis of the femorofemoral bypass 5000 units of heparin is given intravenously and 10 mg of TPA was reconstituted. The penumbra cat 6 device is then prepped on the field a [place] 0.014 wire is exchanged through the [place] catheter. The 6 device is then advanced from the right-sided sheath into the left SFA and then as it is slowly pulled back the TPA is infused. Infusion is across the entire length of the  femorofemoral bypass. This is then allowed to  dwell for 20-30 minutes. Follow-up imaging demonstrated there is now flow through the graft however there appears to be narrowings at both the right anastomosis as well as the left anastomosis.  This is subsequently confirmed with magnified selected images of each common femoral bilaterally.  The 6 device is then reintroduced and used to perform thrombectomy of the entire length of the femorofemoral bypass focusing on both the right and left anastomotic areas. A significant amount of debris is aspirated from both locations as well as the center of the graft. Follow-up hand injection through the right sheath now demonstrates marked improvement of flow through the entire graft. The graft is free of thrombus. There appears to be a 70-80% narrowing at the right common femoral anastomotic area and a 80-90% stenosis at the left common femoral anastomotic area.  Initial inflation in the right anastomotic-common femoral areas with a 6 x 4 [place] balloon is inflated to 18 atm for 1 minute. The balloon is then taken down repositioned to the left common femoral and left anastomotic area and inflated to 18 atm for 1 minute. Follow-up imaging demonstrates improved patency but there are still greater than 30% residual stenosis at both locations. Therefore a 7 x 6 Lutonix drug-eluting balloon is used to antroplasty the right common femoral and femorofemoral anastomotic area. This appears to be a hyperplastic lesion and the hope is that the paclitaxel will prevent restenosis. Following this treatment on the right a second 7 x 6 Lutonix drug-eluting balloon is used to angioplasty the left common femoral and anastomotic area. Follow-up imaging demonstrates an excellent result with less than 10% residual stenosis noted at both locations.   Of note, the fact that the access site was located several centimeters below the level of the common femoral bifurcation allowed access to both the right femoral system as well as the left  femoral system. The selection of my access site is what allowed treatment on both sides.  Distal runoff was then performed on the left.  After review of the images the catheter is removed sheath is pulled back into the right superficial femoral artery J-wire is then advanced and socially and RAO projection of the right groin is obtained after review of this image a minx device is deployed without difficulty.  INTERPRETATION: The abdominal aorta is opacified with a bolus injection of contrast. There is diffuse atherosclerotic changes clearly visible even under fluoroscopy without contrast enhancement. However, there are no hemodynamically significant lesions noted within the aorta, the right common iliac artery and external iliac artery are patent and free of hemodynamically significant stenoses. Previously placed stents in both arteries are noted.  Of note the right internal iliac artery is diffusely diseased but does contribute collaterals to the left groin area.  The right proximal common femoral and profunda femoris arteries are widely patent. The right superficial femoral artery demonstrates diffuse disease but is patent.   Initial images demonstrate the femorofemoral is occluded. Once successful thrombectomy has been performed the femoral-femoral bypass demonstrates high-grade greater than 80% stenoses involving the anastomoses of both the right common femoral as well as the left common femoral.  The left superficial femoral and profunda femoris are widely patent and there is patency of the popliteal and trifurcation as well as the tibial arteries with apparent three-vessel runoff.  Following angioplasty of both the right common femoral and anastomotic area as well as the left common femoral and anastomotic area there is now patency  of the femorofemoral bypass with rapid filling of the left leg and less than 5% residual stenosis at both locations.   COMPLICATIONS: There were no immediate  consultations.  CONDITION: Martin Jennings, M.D. [facility] Office: [phone]   04/12/2017,11:42 AM

## 2017-04-12 NOTE — Progress Notes (Signed)
Procedure done per Dr Delana Meyer, tolerated well. Vitals remained stable throughout entire procedure. Received versed 5mg  iv along with Fentanyl 231mcg iv for procedure. Will transfer to specials to finish recovery prior to discharge.

## 2017-04-12 NOTE — H&P (Signed)
[facility] History & Physical Update  The patient was interviewed and re-examined.  The patient's previous History and Physical has been reviewed and is unchanged.  There is no change in the plan of care. We plan to proceed with the scheduled procedure.  Hortencia Pilar, MD  04/12/2017, 9:50 AM

## 2017-05-03 ENCOUNTER — Other Ambulatory Visit (INDEPENDENT_AMBULATORY_CARE_PROVIDER_SITE_OTHER): Payer: Self-pay | Admitting: Vascular Surgery

## 2017-05-03 DIAGNOSIS — I739 Peripheral vascular disease, unspecified: Secondary | ICD-10-CM

## 2017-05-04 ENCOUNTER — Ambulatory Visit (INDEPENDENT_AMBULATORY_CARE_PROVIDER_SITE_OTHER): Payer: Medicare HMO | Admitting: Vascular Surgery

## 2017-05-04 ENCOUNTER — Encounter (INDEPENDENT_AMBULATORY_CARE_PROVIDER_SITE_OTHER): Payer: Medicare HMO

## 2017-06-28 ENCOUNTER — Telehealth: Payer: Self-pay | Admitting: *Deleted

## 2017-06-28 NOTE — Telephone Encounter (Signed)
Notified patient that annual lung cancer screening low dose CT scan is due currently or will be in near future. Patient request to wait until January for lung screening scan.

## 2017-07-25 ENCOUNTER — Telehealth: Payer: Self-pay | Admitting: *Deleted

## 2017-07-25 DIAGNOSIS — Z87891 Personal history of nicotine dependence: Secondary | ICD-10-CM

## 2017-07-25 DIAGNOSIS — Z122 Encounter for screening for malignant neoplasm of respiratory organs: Secondary | ICD-10-CM

## 2017-07-25 NOTE — Telephone Encounter (Signed)
Notified patient that annual lung cancer screening low dose CT scan is due currently or will be in near future. Confirmed that patient is within the age range of 55-77, and asymptomatic, (no signs or symptoms of lung cancer). Patient denies illness that would prevent curative treatment for lung cancer if found. Verified smoking history, (current, 62.5 pack year). The shared decision making visit was done 06/29/16. Patient is agreeable for CT scan being scheduled.

## 2017-08-10 ENCOUNTER — Ambulatory Visit: Admission: RE | Admit: 2017-08-10 | Payer: Medicare HMO | Source: Ambulatory Visit

## 2017-08-30 ENCOUNTER — Telehealth: Payer: Self-pay | Admitting: *Deleted

## 2017-08-30 NOTE — Telephone Encounter (Signed)
Patient agreeable for having lung screening scan rescheduled. Please see prior documentation for eligibility.

## 2017-09-06 ENCOUNTER — Ambulatory Visit: Admission: RE | Admit: 2017-09-06 | Payer: Medicare HMO | Source: Ambulatory Visit

## 2017-09-13 ENCOUNTER — Telehealth: Payer: Self-pay | Admitting: *Deleted

## 2017-09-13 NOTE — Telephone Encounter (Signed)
Left message with wife for patient to notify them that it is time to schedule annual low dose lung cancer screening CT scan. Instructed patient to call back to verify information prior to the scan being scheduled. Note, patient was not able to keep previously scheduled appt.

## 2017-09-22 ENCOUNTER — Encounter: Payer: Self-pay | Admitting: *Deleted

## 2019-09-18 LAB — COLOGUARD: COLOGUARD: POSITIVE — AB

## 2020-03-31 ENCOUNTER — Other Ambulatory Visit: Payer: Self-pay

## 2020-03-31 ENCOUNTER — Encounter: Payer: Self-pay | Admitting: Emergency Medicine

## 2020-03-31 ENCOUNTER — Emergency Department
Admission: EM | Admit: 2020-03-31 | Discharge: 2020-03-31 | Disposition: A | Discharge status: Home / Self Care | Payer: Medicare Other | Attending: Emergency Medicine | Admitting: Emergency Medicine

## 2020-03-31 DIAGNOSIS — Z5321 Procedure and treatment not carried out due to patient leaving prior to being seen by health care provider: Secondary | ICD-10-CM | POA: Insufficient documentation

## 2020-03-31 DIAGNOSIS — R630 Anorexia: Secondary | ICD-10-CM | POA: Diagnosis not present

## 2020-03-31 DIAGNOSIS — R5383 Other fatigue: Secondary | ICD-10-CM | POA: Diagnosis not present

## 2020-03-31 DIAGNOSIS — R197 Diarrhea, unspecified: Secondary | ICD-10-CM | POA: Diagnosis present

## 2020-03-31 LAB — CBC
HCT: 48.1 % (ref 39.0–52.0)
Hemoglobin: 16.1 g/dL (ref 13.0–17.0)
MCH: 31.3 pg (ref 26.0–34.0)
MCHC: 33.5 g/dL (ref 30.0–36.0)
MCV: 93.4 fL (ref 80.0–100.0)
Platelets: 176 10*3/uL (ref 150–400)
RBC: 5.15 MIL/uL (ref 4.22–5.81)
RDW: 12 % (ref 11.5–15.5)
WBC: 4.4 10*3/uL (ref 4.0–10.5)
nRBC: 0 % (ref 0.0–0.2)

## 2020-03-31 LAB — COMPREHENSIVE METABOLIC PANEL
ALT: 18 U/L (ref 0–44)
AST: 25 U/L (ref 15–41)
Albumin: 3.3 g/dL — ABNORMAL LOW (ref 3.5–5.0)
Alkaline Phosphatase: 48 U/L (ref 38–126)
Anion gap: 12 (ref 5–15)
BUN: 32 mg/dL — ABNORMAL HIGH (ref 8–23)
CO2: 22 mmol/L (ref 22–32)
Calcium: 8.2 mg/dL — ABNORMAL LOW (ref 8.9–10.3)
Chloride: 101 mmol/L (ref 98–111)
Creatinine, Ser: 2.46 mg/dL — ABNORMAL HIGH (ref 0.61–1.24)
GFR calc Af Amer: 30 mL/min — ABNORMAL LOW (ref >60)
GFR calc non Af Amer: 26 mL/min — ABNORMAL LOW (ref >60)
Glucose, Bld: 101 mg/dL — ABNORMAL HIGH (ref 70–99)
Potassium: 3.9 mmol/L (ref 3.5–5.1)
Sodium: 135 mmol/L (ref 135–145)
Total Bilirubin: 0.6 mg/dL (ref 0.3–1.2)
Total Protein: 7.6 g/dL (ref 6.5–8.1)

## 2020-03-31 LAB — LIPASE, BLOOD: Lipase: 58 U/L — ABNORMAL HIGH (ref 11–51)

## 2020-03-31 NOTE — ED Notes (Signed)
Placed on 2l/ [place].  Sats improved to 90%.  Increased to 3l/   Sats 92%

## 2020-03-31 NOTE — ED Triage Notes (Addendum)
Decrease appetite, fatigue, and diarrhea x 2 days.  Daughter just tested positive for COVID today.

## 2020-04-01 ENCOUNTER — Telehealth: Payer: Self-pay | Admitting: Emergency Medicine

## 2020-04-01 NOTE — Telephone Encounter (Signed)
Called patient due to lwot to inquire about condition and follow up plans. He says he has covid and his wife does too.  They have not been tested, but their daughter had it and she was staying with them.  I told him that his oxygen level was low and kidney function worse when he was here.  I told him he can always return here, especially if he is short of breath.  I told him at the least to call his pcp.

## 2020-04-03 ENCOUNTER — Other Ambulatory Visit: Payer: Self-pay

## 2020-04-03 ENCOUNTER — Inpatient Hospital Stay
Admission: EM | Admit: 2020-04-03 | Discharge: 2020-04-13 | DRG: 177 | Disposition: A | Discharge status: Home Health | Payer: Medicare Other | Attending: Family Medicine | Admitting: Family Medicine

## 2020-04-03 ENCOUNTER — Emergency Department: Payer: Medicare Other

## 2020-04-03 DIAGNOSIS — U071 COVID-19: Secondary | ICD-10-CM

## 2020-04-03 DIAGNOSIS — E1151 Type 2 diabetes mellitus with diabetic peripheral angiopathy without gangrene: Secondary | ICD-10-CM | POA: Diagnosis present

## 2020-04-03 DIAGNOSIS — D696 Thrombocytopenia, unspecified: Secondary | ICD-10-CM | POA: Diagnosis present

## 2020-04-03 DIAGNOSIS — Y929 Unspecified place or not applicable: Secondary | ICD-10-CM

## 2020-04-03 DIAGNOSIS — R042 Hemoptysis: Secondary | ICD-10-CM | POA: Diagnosis present

## 2020-04-03 DIAGNOSIS — J9601 Acute respiratory failure with hypoxia: Secondary | ICD-10-CM | POA: Diagnosis present

## 2020-04-03 DIAGNOSIS — Z79899 Other long term (current) drug therapy: Secondary | ICD-10-CM

## 2020-04-03 DIAGNOSIS — Z8551 Personal history of malignant neoplasm of bladder: Secondary | ICD-10-CM

## 2020-04-03 DIAGNOSIS — F17210 Nicotine dependence, cigarettes, uncomplicated: Secondary | ICD-10-CM | POA: Diagnosis present

## 2020-04-03 DIAGNOSIS — J189 Pneumonia, unspecified organism: Secondary | ICD-10-CM | POA: Diagnosis present

## 2020-04-03 DIAGNOSIS — J1282 Pneumonia due to coronavirus disease 2019: Secondary | ICD-10-CM | POA: Diagnosis present

## 2020-04-03 DIAGNOSIS — R630 Anorexia: Secondary | ICD-10-CM | POA: Diagnosis present

## 2020-04-03 DIAGNOSIS — R0902 Hypoxemia: Secondary | ICD-10-CM | POA: Diagnosis not present

## 2020-04-03 DIAGNOSIS — J159 Unspecified bacterial pneumonia: Secondary | ICD-10-CM | POA: Diagnosis present

## 2020-04-03 DIAGNOSIS — J439 Emphysema, unspecified: Secondary | ICD-10-CM | POA: Diagnosis present

## 2020-04-03 DIAGNOSIS — I129 Hypertensive chronic kidney disease with stage 1 through stage 4 chronic kidney disease, or unspecified chronic kidney disease: Secondary | ICD-10-CM | POA: Diagnosis present

## 2020-04-03 DIAGNOSIS — R001 Bradycardia, unspecified: Secondary | ICD-10-CM | POA: Diagnosis present

## 2020-04-03 DIAGNOSIS — N1831 Chronic kidney disease, stage 3a: Secondary | ICD-10-CM | POA: Diagnosis present

## 2020-04-03 DIAGNOSIS — Z7982 Long term (current) use of aspirin: Secondary | ICD-10-CM

## 2020-04-03 DIAGNOSIS — I10 Essential (primary) hypertension: Secondary | ICD-10-CM | POA: Diagnosis present

## 2020-04-03 DIAGNOSIS — I739 Peripheral vascular disease, unspecified: Secondary | ICD-10-CM | POA: Diagnosis present

## 2020-04-03 DIAGNOSIS — Z6823 Body mass index (BMI) 23.0-23.9, adult: Secondary | ICD-10-CM

## 2020-04-03 DIAGNOSIS — E1165 Type 2 diabetes mellitus with hyperglycemia: Secondary | ICD-10-CM | POA: Diagnosis not present

## 2020-04-03 DIAGNOSIS — E785 Hyperlipidemia, unspecified: Secondary | ICD-10-CM | POA: Diagnosis present

## 2020-04-03 DIAGNOSIS — T380X5A Adverse effect of glucocorticoids and synthetic analogues, initial encounter: Secondary | ICD-10-CM | POA: Diagnosis present

## 2020-04-03 DIAGNOSIS — N179 Acute kidney failure, unspecified: Secondary | ICD-10-CM | POA: Diagnosis present

## 2020-04-03 DIAGNOSIS — E1122 Type 2 diabetes mellitus with diabetic chronic kidney disease: Secondary | ICD-10-CM | POA: Diagnosis present

## 2020-04-03 DIAGNOSIS — Z7902 Long term (current) use of antithrombotics/antiplatelets: Secondary | ICD-10-CM

## 2020-04-03 HISTORY — DX: COVID-19: U07.1

## 2020-04-03 LAB — CBC WITH DIFFERENTIAL/PLATELET
Abs Immature Granulocytes: 0.01 10*3/uL (ref 0.00–0.07)
Basophils Absolute: 0 10*3/uL (ref 0.0–0.1)
Basophils Relative: 0 %
Eosinophils Absolute: 0 10*3/uL (ref 0.0–0.5)
Eosinophils Relative: 1 %
HCT: 45.8 % (ref 39.0–52.0)
Hemoglobin: 15.4 g/dL (ref 13.0–17.0)
Immature Granulocytes: 0 %
Lymphocytes Relative: 19 %
Lymphs Abs: 0.6 10*3/uL — ABNORMAL LOW (ref 0.7–4.0)
MCH: 31.6 pg (ref 26.0–34.0)
MCHC: 33.6 g/dL (ref 30.0–36.0)
MCV: 93.9 fL (ref 80.0–100.0)
Monocytes Absolute: 0.2 10*3/uL (ref 0.1–1.0)
Monocytes Relative: 6 %
Neutro Abs: 2.5 10*3/uL (ref 1.7–7.7)
Neutrophils Relative %: 74 %
Platelets: 138 10*3/uL — ABNORMAL LOW (ref 150–400)
RBC: 4.88 MIL/uL (ref 4.22–5.81)
RDW: 12.4 % (ref 11.5–15.5)
WBC: 3.4 10*3/uL — ABNORMAL LOW (ref 4.0–10.5)
nRBC: 0 % (ref 0.0–0.2)

## 2020-04-03 LAB — BASIC METABOLIC PANEL
Anion gap: 11 (ref 5–15)
BUN: 57 mg/dL — ABNORMAL HIGH (ref 8–23)
CO2: 17 mmol/L — ABNORMAL LOW (ref 22–32)
Calcium: 7.7 mg/dL — ABNORMAL LOW (ref 8.9–10.3)
Chloride: 105 mmol/L (ref 98–111)
Creatinine, Ser: 3.7 mg/dL — ABNORMAL HIGH (ref 0.61–1.24)
GFR calc Af Amer: 18 mL/min — ABNORMAL LOW (ref >60)
GFR calc non Af Amer: 16 mL/min — ABNORMAL LOW (ref >60)
Glucose, Bld: 122 mg/dL — ABNORMAL HIGH (ref 70–99)
Potassium: 4.5 mmol/L (ref 3.5–5.1)
Sodium: 133 mmol/L — ABNORMAL LOW (ref 135–145)

## 2020-04-03 LAB — LACTIC ACID, PLASMA: Lactic Acid, Venous: 1.2 mmol/L (ref 0.5–1.9)

## 2020-04-03 LAB — TROPONIN I (HIGH SENSITIVITY): Troponin I (High Sensitivity): 16 ng/L (ref <18)

## 2020-04-03 MED ORDER — SODIUM CHLORIDE 0.9 % IV SOLN
2.0000 g | INTRAVENOUS | Status: DC
  Administered 2020-04-03: 2 g via INTRAVENOUS
  Filled 2020-04-03: qty 20

## 2020-04-03 MED ORDER — LACTATED RINGERS IV BOLUS
1000.0000 mL | Freq: Once | INTRAVENOUS | Status: AC
  Administered 2020-04-03: 1000 mL via INTRAVENOUS

## 2020-04-03 MED ORDER — SODIUM CHLORIDE 0.9 % IV SOLN
500.0000 mg | Freq: Once | INTRAVENOUS | Status: AC
  Administered 2020-04-03: 500 mg via INTRAVENOUS
  Filled 2020-04-03: qty 500

## 2020-04-03 MED ORDER — SODIUM CHLORIDE 0.9 % IV BOLUS
1000.0000 mL | Freq: Once | INTRAVENOUS | Status: AC
  Administered 2020-04-03: 1000 mL via INTRAVENOUS

## 2020-04-03 NOTE — ED Triage Notes (Signed)
Pt in via EMS from home with c/o possible COVID. Pt exposed to COVID from daughter who is +. EMS reports pt has been spitting up bright red blod with coughing. Sats 80-84% on RA, orthostatic on standing. Pt received 2 duonebs en route. Pt also got 54mls of saline and 125,g of solumedrol. #20g to left hand. 3L of O2 at 95% now

## 2020-04-03 NOTE — ED Provider Notes (Signed)
[facility] Emergency Department Provider Note  ____________________________________________   First MD Initiated Contact with Patient 04/03/20 2147     (approximate)  I have reviewed the triage vital signs and the nursing notes.   HISTORY  Chief Complaint Shortness of Breath and Hemoptysis    HPI Martin Jennings is a 69 y.o. male  With PMHx HTN, PVD, HLD, here with cough, SOB. Pt states that his sx started approximately 2-3 weeks ago. His daughter has COVID and was in the house. He and his wife then developed cough, fatigue. His sx have been relatively stable over 3 weeks but have worsened over the past 24 hours, and he's now developed worsening cough, SOB, and sputum production. He has been having blood tinged sputum. He has also developed SOB, btoh at rest and w/ exertion. No overt chest pain. No nausea, vomiting. Wife has similar sx. He is not vaccinated. No alleviating factors.        Past Medical History:  Diagnosis Date  . Bladder cancer ([place]) 2012  . Hypertension   . Peripheral vascular disease [facility])     Patient Active Problem List   Diagnosis Date Noted  . Atherosclerosis of native arteries of extremity with rest pain ([place]) 04/03/2017  . Atherosclerosis of native arteries of extremity with intermittent claudication ([place]) 01/25/2017  . Leg pain 01/25/2017  . Essential hypertension 01/25/2017  . Hyperlipidemia 01/25/2017  . Personal history of tobacco use, presenting hazards to health 06/30/2016    Past Surgical History:  Procedure Laterality Date  . BLADDER REMOVAL    . LOWER EXTREMITY ANGIOGRAPHY Left 04/12/2017   Procedure: Lower Extremity Angiography;  Surgeon: Katha Cabal, MD;  Location: [place] [facility];  Service: Cardiovascular;  Laterality: Left;  . uretostomy    . VASCULAR SURGERY      Prior to Admission medications   Medication Sig Start Date End Date Taking? Authorizing Provider  amLODipine (NORVASC) 10 MG tablet  Take 10 mg by mouth daily.  03/12/17   [provider]  aspirin EC 81 MG tablet Take 81 mg by mouth daily.     [provider]  atorvastatin (LIPITOR) 20 MG tablet Take 20 mg by mouth every evening.  01/27/17   [provider]  carvedilol (COREG) 6.25 MG tablet Take 6.25 mg by mouth 2 (two) times daily with a meal.  01/04/17   [provider]  clopidogrel (PLAVIX) 75 MG tablet Take 75 mg by mouth daily.  04/30/13   [provider]    Allergies Patient has no known allergies.  No family history on file.  Social History Social History   Tobacco Use  . Smoking status: Current Every Day Smoker    Packs/day: 1.25    Years: 50.00    Pack years: 62.50    Types: Cigarettes  . Smokeless tobacco: Never Used  Vaping Use  . Vaping Use: Never used  Substance Use Topics  . Alcohol use: No  . Drug use: Yes    Types: Marijuana    Review of Systems  Review of Systems  Constitutional: Positive for chills, fatigue and fever.  HENT: Negative for sore throat.   Respiratory: Positive for cough and shortness of breath.   Cardiovascular: Negative for chest pain.  Gastrointestinal: Negative for abdominal pain.  Genitourinary: Negative for flank pain.  Musculoskeletal: Negative for neck pain.  Skin: Negative for rash and wound.  Allergic/Immunologic: Negative for immunocompromised state.  Neurological: Positive for weakness. Negative for  numbness.  Hematological: Does not bruise/bleed easily.  All other systems reviewed and are negative.    ____________________________________________  PHYSICAL EXAM:      VITAL SIGNS: ED Triage Vitals  Enc Vitals Group     BP 04/03/20 1254 104/63     Pulse Rate 04/03/20 1254 80     Resp 04/03/20 1254 (!) 22     Temp 04/03/20 1254 98.9 F (37.2 C)     Temp Source 04/03/20 1254 Oral     SpO2 04/03/20 1254 99 %     Weight 04/03/20 1257 187 lb (84.8 kg)     Height 04/03/20 1257 6' (1.829 m)     Head  Circumference --      Peak Flow --      Pain Score 04/03/20 1256 8     Pain Loc --      Pain Edu? --      Excl. in [place]? --      Physical Exam Vitals and nursing note reviewed.  Constitutional:      General: He is not in acute distress.    Appearance: He is well-developed.  HENT:     Head: Normocephalic and atraumatic.  Eyes:     Conjunctiva/sclera: Conjunctivae normal.  Cardiovascular:     Rate and Rhythm: Regular rhythm.     Heart sounds: Normal heart sounds. No murmur heard.  No friction rub.  Pulmonary:     Effort: Pulmonary effort is normal. Tachypnea present. No respiratory distress.     Breath sounds: Examination of the right-middle field reveals rhonchi. Examination of the right-lower field reveals rhonchi. Rhonchi and rales present. No wheezing.  Abdominal:     General: There is no distension.     Palpations: Abdomen is soft.     Tenderness: There is no abdominal tenderness.  Musculoskeletal:     Cervical back: Neck supple.  Skin:    General: Skin is warm.     Capillary Refill: Capillary refill takes less than 2 seconds.  Neurological:     Mental Status: He is alert and oriented to person, place, and time.     Motor: No abnormal muscle tone.       ____________________________________________   LABS (all labs ordered are listed, but only abnormal results are displayed)  Labs Reviewed  BASIC METABOLIC PANEL - Abnormal; Notable for the following components:      Result Value   Sodium 133 (*)    CO2 17 (*)    Glucose, Bld 122 (*)    BUN 57 (*)    Creatinine, Ser 3.70 (*)    Calcium 7.7 (*)    GFR calc non Af Amer 16 (*)    GFR calc Af Amer 18 (*)    All other components within normal limits  CBC WITH DIFFERENTIAL/PLATELET - Abnormal; Notable for the following components:   WBC 3.4 (*)    Platelets 138 (*)    Lymphs Abs 0.6 (*)    All other components within normal limits  CULTURE, BLOOD (ROUTINE X 2)  CULTURE, BLOOD (ROUTINE X 2)  SARS CORONAVIRUS 2  BY RT PCR (HOSPITAL ORDER, [place] [facility])  LACTIC ACID, PLASMA  LACTIC ACID, PLASMA  C-REACTIVE PROTEIN  FIBRIN DERIVATIVES D-DIMER ([facility] ONLY)  PROCALCITONIN  TROPONIN I (HIGH SENSITIVITY)  TROPONIN I (HIGH SENSITIVITY)    ____________________________________________  EKG: Normal sinus rhythm, VR 84. PR 156, QRS 62, QTc 453. No acute ST elevaitons or depressions. No ischemia or infarct.  ________________________________________  RADIOLOGY All imaging, including plain films, CT scans, and ultrasounds, independently reviewed by me, and interpretations confirmed via formal radiology reads.  ED MD interpretation:   CXR: RUL PNA  Official radiology report(s): DG Chest 2 View  Result Date: 04/03/2020 CLINICAL DATA:  Shortness of breath with hemoptysis and cough EXAM: CHEST - 2 VIEW COMPARISON:  None. FINDINGS: There is airspace opacity in the anterior segment of the right upper lobe. The lungs elsewhere are clear although mildly hyperexpanded. The heart size and pulmonary vascularity are normal. No adenopathy. No bone lesions. IMPRESSION: Airspace opacity consistent with pneumonia in the anterior segment right upper lobe. Lungs otherwise are clear although mildly hyperexpanded. Heart size normal. No adenopathy. Electronically Signed   By: Lowella Grip III M.D.   On: 04/03/2020 13:31    ____________________________________________  PROCEDURES   Procedure(s) performed (including Critical Care):  .Critical Care Performed by: Duffy Bruce, MD Authorized by: Duffy Bruce, MD   Critical care provider statement:    Critical care time (minutes):  35   Critical care time was exclusive of:  Separately billable procedures and treating other patients and teaching time   Critical care was necessary to treat or prevent imminent or life-threatening deterioration of the following conditions:  Cardiac failure, circulatory failure, respiratory failure and sepsis    Critical care was time spent personally by me on the following activities:  Development of treatment plan with patient or surrogate, discussions with consultants, evaluation of patient's response to treatment, examination of patient, obtaining history from patient or surrogate, ordering and performing treatments and interventions, ordering and review of laboratory studies, ordering and review of radiographic studies, pulse oximetry, re-evaluation of patient's condition and review of old charts   I assumed direction of critical care for this patient from another provider in my specialty: no      ____________________________________________  INITIAL IMPRESSION / MDM / [place] / ED COURSE  As part of my medical decision making, I reviewed the following data within the [facility] notes reviewed and incorporated, Old chart reviewed, Notes from prior ED visits, and  Controlled Substance Database       *Martin Jennings was evaluated in Emergency Department on 04/03/2020 for the symptoms described in the history of present illness. He was evaluated in the context of the global COVID-19 pandemic, which necessitated consideration that the patient might be at risk for infection with the SARS-CoV-2 virus that causes COVID-19. Institutional protocols and algorithms that pertain to the evaluation of patients at risk for COVID-19 are in a state of rapid change based on information released by regulatory bodies including the CDC and federal and state organizations. These policies and algorithms were followed during the patient's care in the ED.  Some ED evaluations and interventions may be delayed as a result of limited staffing during the pandemic.*     Medical Decision Making: 69 year old male here with acute hypoxic respiratory failure, acute kidney injury.  Patient has known Covid exposures.  He was hypoxic and required 3 L nasal cannula.  Suspect ongoing Covid pneumonia  with hypoxia versus right upper lobe pneumonia superimposed on recent viral illness.  Differential also includes PE, though he cannot obtain a CT due to his AKI.  Will start fluids, antibiotics, send D-dimer, and reassess.  ____________________________________________  FINAL CLINICAL IMPRESSION(S) / ED DIAGNOSES  Final diagnoses:  Hypoxia  Community acquired pneumonia of right upper lobe of lung     MEDICATIONS GIVEN DURING THIS  VISIT:  Medications  cefTRIAXone (ROCEPHIN) 2 g in sodium chloride 0.9 % 100 mL IVPB (has no administration in time range)  azithromycin (ZITHROMAX) 500 mg in sodium chloride 0.9 % 250 mL IVPB (has no administration in time range)  sodium chloride 0.9 % bolus 1,000 mL (has no administration in time range)  lactated ringers bolus 1,000 mL (has no administration in time range)     ED Discharge Orders    None       Note:  This document was prepared using Dragon voice recognition software and may include unintentional dictation errors.   Duffy Bruce, MD 04/04/20 Reece Agar

## 2020-04-03 NOTE — ED Triage Notes (Signed)
See first nurse note..Pt comes into the ED via EMS from home, states his daughter had covid and he began to show sx about 3 weeks ago , for the past 2 weeks states his sx have worsened and has had bloody sputum. Pt is currently on 3L [place].. pt also c/o watery diarrhea for the past 2 weeks.states he came here 3 days ago but LWBS, states his has not been tested for covid or treated since his sx started 3 weeks ago

## 2020-04-04 DIAGNOSIS — U071 COVID-19: Secondary | ICD-10-CM | POA: Diagnosis present

## 2020-04-04 DIAGNOSIS — R0902 Hypoxemia: Secondary | ICD-10-CM | POA: Diagnosis present

## 2020-04-04 DIAGNOSIS — N171 Acute kidney failure with acute cortical necrosis: Secondary | ICD-10-CM

## 2020-04-04 DIAGNOSIS — I10 Essential (primary) hypertension: Secondary | ICD-10-CM | POA: Diagnosis not present

## 2020-04-04 DIAGNOSIS — J9601 Acute respiratory failure with hypoxia: Secondary | ICD-10-CM | POA: Diagnosis present

## 2020-04-04 DIAGNOSIS — J189 Pneumonia, unspecified organism: Secondary | ICD-10-CM | POA: Diagnosis not present

## 2020-04-04 DIAGNOSIS — Z8551 Personal history of malignant neoplasm of bladder: Secondary | ICD-10-CM | POA: Diagnosis not present

## 2020-04-04 DIAGNOSIS — J439 Emphysema, unspecified: Secondary | ICD-10-CM | POA: Diagnosis present

## 2020-04-04 DIAGNOSIS — R590 Localized enlarged lymph nodes: Secondary | ICD-10-CM | POA: Diagnosis not present

## 2020-04-04 DIAGNOSIS — D696 Thrombocytopenia, unspecified: Secondary | ICD-10-CM | POA: Diagnosis present

## 2020-04-04 DIAGNOSIS — Z79899 Other long term (current) drug therapy: Secondary | ICD-10-CM | POA: Diagnosis not present

## 2020-04-04 DIAGNOSIS — J159 Unspecified bacterial pneumonia: Secondary | ICD-10-CM | POA: Diagnosis present

## 2020-04-04 DIAGNOSIS — R001 Bradycardia, unspecified: Secondary | ICD-10-CM | POA: Diagnosis present

## 2020-04-04 DIAGNOSIS — Y929 Unspecified place or not applicable: Secondary | ICD-10-CM | POA: Diagnosis not present

## 2020-04-04 DIAGNOSIS — N179 Acute kidney failure, unspecified: Secondary | ICD-10-CM

## 2020-04-04 DIAGNOSIS — R918 Other nonspecific abnormal finding of lung field: Secondary | ICD-10-CM | POA: Diagnosis not present

## 2020-04-04 DIAGNOSIS — R630 Anorexia: Secondary | ICD-10-CM | POA: Diagnosis present

## 2020-04-04 DIAGNOSIS — Z7902 Long term (current) use of antithrombotics/antiplatelets: Secondary | ICD-10-CM | POA: Diagnosis not present

## 2020-04-04 DIAGNOSIS — E785 Hyperlipidemia, unspecified: Secondary | ICD-10-CM

## 2020-04-04 DIAGNOSIS — J1282 Pneumonia due to coronavirus disease 2019: Secondary | ICD-10-CM | POA: Diagnosis present

## 2020-04-04 DIAGNOSIS — Z7982 Long term (current) use of aspirin: Secondary | ICD-10-CM | POA: Diagnosis not present

## 2020-04-04 DIAGNOSIS — E1122 Type 2 diabetes mellitus with diabetic chronic kidney disease: Secondary | ICD-10-CM | POA: Diagnosis present

## 2020-04-04 DIAGNOSIS — F17210 Nicotine dependence, cigarettes, uncomplicated: Secondary | ICD-10-CM | POA: Diagnosis present

## 2020-04-04 DIAGNOSIS — I129 Hypertensive chronic kidney disease with stage 1 through stage 4 chronic kidney disease, or unspecified chronic kidney disease: Secondary | ICD-10-CM | POA: Diagnosis present

## 2020-04-04 DIAGNOSIS — E1165 Type 2 diabetes mellitus with hyperglycemia: Secondary | ICD-10-CM | POA: Diagnosis not present

## 2020-04-04 DIAGNOSIS — R042 Hemoptysis: Secondary | ICD-10-CM | POA: Diagnosis present

## 2020-04-04 DIAGNOSIS — N1831 Chronic kidney disease, stage 3a: Secondary | ICD-10-CM | POA: Diagnosis present

## 2020-04-04 DIAGNOSIS — E1151 Type 2 diabetes mellitus with diabetic peripheral angiopathy without gangrene: Secondary | ICD-10-CM | POA: Diagnosis present

## 2020-04-04 DIAGNOSIS — Z6823 Body mass index (BMI) 23.0-23.9, adult: Secondary | ICD-10-CM | POA: Diagnosis not present

## 2020-04-04 DIAGNOSIS — T380X5A Adverse effect of glucocorticoids and synthetic analogues, initial encounter: Secondary | ICD-10-CM | POA: Diagnosis present

## 2020-04-04 HISTORY — DX: Pneumonia, unspecified organism: J18.9

## 2020-04-04 HISTORY — DX: Acute kidney failure, unspecified: N17.9

## 2020-04-04 LAB — FIBRIN DERIVATIVES D-DIMER (ARMC ONLY): Fibrin derivatives D-dimer (ARMC): 1300.59 ng/mL (FEU) — ABNORMAL HIGH (ref 0.00–499.00)

## 2020-04-04 LAB — CBC WITH DIFFERENTIAL/PLATELET
Abs Immature Granulocytes: 0.02 10*3/uL (ref 0.00–0.07)
Basophils Absolute: 0 10*3/uL (ref 0.0–0.1)
Basophils Relative: 0 %
Eosinophils Absolute: 0 10*3/uL (ref 0.0–0.5)
Eosinophils Relative: 0 %
HCT: 42.4 % (ref 39.0–52.0)
Hemoglobin: 15 g/dL (ref 13.0–17.0)
Immature Granulocytes: 1 %
Lymphocytes Relative: 12 %
Lymphs Abs: 0.4 10*3/uL — ABNORMAL LOW (ref 0.7–4.0)
MCH: 31.4 pg (ref 26.0–34.0)
MCHC: 35.4 g/dL (ref 30.0–36.0)
MCV: 88.7 fL (ref 80.0–100.0)
Monocytes Absolute: 0.3 10*3/uL (ref 0.1–1.0)
Monocytes Relative: 9 %
Neutro Abs: 2.3 10*3/uL (ref 1.7–7.7)
Neutrophils Relative %: 78 %
Platelets: 146 10*3/uL — ABNORMAL LOW (ref 150–400)
RBC: 4.78 MIL/uL (ref 4.22–5.81)
RDW: 12.4 % (ref 11.5–15.5)
WBC: 2.9 10*3/uL — ABNORMAL LOW (ref 4.0–10.5)
nRBC: 0 % (ref 0.0–0.2)

## 2020-04-04 LAB — COMPREHENSIVE METABOLIC PANEL
ALT: 17 U/L (ref 0–44)
AST: 24 U/L (ref 15–41)
Albumin: 2.6 g/dL — ABNORMAL LOW (ref 3.5–5.0)
Alkaline Phosphatase: 42 U/L (ref 38–126)
Anion gap: 7 (ref 5–15)
BUN: 64 mg/dL — ABNORMAL HIGH (ref 8–23)
CO2: 21 mmol/L — ABNORMAL LOW (ref 22–32)
Calcium: 7.5 mg/dL — ABNORMAL LOW (ref 8.9–10.3)
Chloride: 108 mmol/L (ref 98–111)
Creatinine, Ser: 2.79 mg/dL — ABNORMAL HIGH (ref 0.61–1.24)
GFR calc Af Amer: 26 mL/min — ABNORMAL LOW (ref >60)
GFR calc non Af Amer: 22 mL/min — ABNORMAL LOW (ref >60)
Glucose, Bld: 143 mg/dL — ABNORMAL HIGH (ref 70–99)
Potassium: 5 mmol/L (ref 3.5–5.1)
Sodium: 136 mmol/L (ref 135–145)
Total Bilirubin: 0.5 mg/dL (ref 0.3–1.2)
Total Protein: 6.5 g/dL (ref 6.5–8.1)

## 2020-04-04 LAB — EXPECTORATED SPUTUM ASSESSMENT W GRAM STAIN, RFLX TO RESP C

## 2020-04-04 LAB — MAGNESIUM: Magnesium: 2.2 mg/dL (ref 1.7–2.4)

## 2020-04-04 LAB — HIV ANTIBODY (ROUTINE TESTING W REFLEX): HIV Screen 4th Generation wRfx: NONREACTIVE

## 2020-04-04 LAB — LACTIC ACID, PLASMA: Lactic Acid, Venous: 1.3 mmol/L (ref 0.5–1.9)

## 2020-04-04 LAB — PROCALCITONIN: Procalcitonin: 0.5 ng/mL

## 2020-04-04 LAB — SARS CORONAVIRUS 2 BY RT PCR (HOSPITAL ORDER, PERFORMED IN ~~LOC~~ HOSPITAL LAB): SARS Coronavirus 2: POSITIVE — AB

## 2020-04-04 LAB — TROPONIN I (HIGH SENSITIVITY): Troponin I (High Sensitivity): 13 ng/L (ref <18)

## 2020-04-04 LAB — C-REACTIVE PROTEIN: CRP: 2.8 mg/dL — ABNORMAL HIGH (ref <1.0)

## 2020-04-04 MED ORDER — ATORVASTATIN CALCIUM 20 MG PO TABS
20.0000 mg | ORAL_TABLET | Freq: Every evening | ORAL | Status: DC
  Administered 2020-04-04 – 2020-04-12 (×8): 20 mg via ORAL
  Filled 2020-04-04 (×8): qty 1

## 2020-04-04 MED ORDER — SODIUM CHLORIDE 0.9 % IV SOLN
500.0000 mg | INTRAVENOUS | Status: DC
  Administered 2020-04-04 – 2020-04-05 (×2): 500 mg via INTRAVENOUS
  Filled 2020-04-04 (×4): qty 500

## 2020-04-04 MED ORDER — HEPARIN SODIUM (PORCINE) 5000 UNIT/ML IJ SOLN
5000.0000 [IU] | Freq: Three times a day (TID) | INTRAMUSCULAR | Status: DC
  Administered 2020-04-04 – 2020-04-13 (×23): 5000 [IU] via SUBCUTANEOUS
  Filled 2020-04-04 (×23): qty 1

## 2020-04-04 MED ORDER — CLOPIDOGREL BISULFATE 75 MG PO TABS
75.0000 mg | ORAL_TABLET | Freq: Every day | ORAL | Status: DC
  Administered 2020-04-04 – 2020-04-05 (×2): 75 mg via ORAL
  Filled 2020-04-04 (×2): qty 1

## 2020-04-04 MED ORDER — SODIUM CHLORIDE 0.9 % IV SOLN
2.0000 g | INTRAVENOUS | Status: AC
  Administered 2020-04-05 – 2020-04-07 (×4): 2 g via INTRAVENOUS
  Filled 2020-04-04 (×2): qty 20
  Filled 2020-04-04: qty 2
  Filled 2020-04-04: qty 20
  Filled 2020-04-04: qty 2

## 2020-04-04 MED ORDER — CARVEDILOL 3.125 MG PO TABS
6.2500 mg | ORAL_TABLET | Freq: Two times a day (BID) | ORAL | Status: DC
  Administered 2020-04-04 – 2020-04-10 (×14): 6.25 mg via ORAL
  Filled 2020-04-04 (×10): qty 2
  Filled 2020-04-04: qty 1
  Filled 2020-04-04: qty 2
  Filled 2020-04-04: qty 1
  Filled 2020-04-04: qty 2

## 2020-04-04 MED ORDER — AMLODIPINE BESYLATE 10 MG PO TABS
10.0000 mg | ORAL_TABLET | Freq: Every day | ORAL | Status: DC
  Administered 2020-04-04 – 2020-04-13 (×10): 10 mg via ORAL
  Filled 2020-04-04 (×9): qty 1
  Filled 2020-04-04: qty 2

## 2020-04-04 MED ORDER — SODIUM CHLORIDE 0.9 % IV SOLN
INTRAVENOUS | Status: DC
  Administered 2020-04-08: 21:00:00 100 mL/h via INTRAVENOUS

## 2020-04-04 NOTE — ED Notes (Signed)
Patient set up with dinner tray.

## 2020-04-04 NOTE — ED Notes (Signed)
Martin Jennings in lab reports they will add on magnesium blood test now

## 2020-04-04 NOTE — ED Notes (Signed)
Sent another sputum specimen to lab.

## 2020-04-04 NOTE — H&P (Signed)
History and Physical   ADRIELL POLANSKY HWE:[id number] DOB: [birth year] DOA: 04/03/2020  Referring MD/NP/PA: Dr. Lysbeth Galas  PCP: System, Provider Not In   Outpatient Specialists: None  Patient coming from: Home  Chief Complaint: Shortness of breath and cough  HPI: Martin Jennings is a 69 y.o. male with medical history significant of bladder cancer, hypertension, peripheral vascular disease, hyperlipidemia who apparently contracted COVID-19 infection with his wife 3 weeks ago from the daughter.  The daughter is no longer living with them.  He and his wife have been at home trying to cope with it.  Usually supportive care.  They have not been symptomatic since then including the wife who is still homesick.  In the last few days he is getting more progressively short of breath weak and tired.  Patient came to the ER where he was seen and evaluated.  He is hypoxic in the ER requiring oxygen now.  Chest x-ray showed right upper lobe pneumonia.  His Covid testing is pending at this point.  Patient suspected to have secondary bacterial pneumonia as a result of the COVID-19 infection.  He is being admitted to the hospital for further evaluation and treatment..  ED Course: Temperature 98.9 blood pressure 104/63 pulse 80 respiratory rate of 26 oxygen sats 94% on 2 L.  White count 3.4 hemoglobin 15.4 platelets 138.  Sodium 133 potassium 4.5 chloride 105 CO2 of 17 BUN 57 creatinine 3.70 and calcium 7.7 glucose 122.  Chest x-ray showed Space opacity consistent with pneumonia in the anterior segment of the right upper lobe.  Otherwise clear bilaterally.  Patient is being admitted with lobar pneumonia probably related to COVID-19 infection.  Review of Systems: As per HPI otherwise 10 point review of systems negative.    Past Medical History:  Diagnosis Date  . Bladder cancer ([place]) 2012  . Hypertension   . Peripheral vascular disease [facility])     Past Surgical History:  Procedure Laterality Date  . BLADDER  REMOVAL    . LOWER EXTREMITY ANGIOGRAPHY Left 04/12/2017   Procedure: Lower Extremity Angiography;  Surgeon: Katha Cabal, MD;  Location: [place] [facility];  Service: Cardiovascular;  Laterality: Left;  . uretostomy    . VASCULAR SURGERY       reports that he has been smoking cigarettes. He has a 62.50 pack-year smoking history. He has never used smokeless tobacco. He reports current drug use. Drug: Marijuana. He reports that he does not drink alcohol.  No Known Allergies  No family history on file.   Prior to Admission medications   Medication Sig Start Date End Date Taking? Authorizing Provider  amLODipine (NORVASC) 10 MG tablet Take 10 mg by mouth daily.  03/12/17  Yes [provider]  aspirin EC 81 MG tablet Take 81 mg by mouth daily.    Yes [provider]  atorvastatin (LIPITOR) 20 MG tablet Take 20 mg by mouth every evening.  01/27/17  Yes [provider]  carvedilol (COREG) 6.25 MG tablet Take 6.25 mg by mouth 2 (two) times daily with a meal.  01/04/17  Yes [provider]  clopidogrel (PLAVIX) 75 MG tablet Take 75 mg by mouth daily.  04/30/13  Yes [provider]    Physical Exam: Vitals:   04/03/20 1257 04/03/20 1555 04/03/20 1652 04/03/20 2337  BP:  124/69 134/75 127/66  Pulse:  70 74 (!) 55  Resp:  17 17 (!) 26  Temp:  97.9 F (36.6 C) 97.9 F (36.6 C)  TempSrc:  Oral Oral   SpO2:  96% 94% 97%  Weight: 84.8 kg     Height: 6' (1.829 m)         Constitutional: Chronically ill looking, looks weak, no distress Vitals:   04/03/20 1257 04/03/20 1555 04/03/20 1652 04/03/20 2337  BP:  124/69 134/75 127/66  Pulse:  70 74 (!) 55  Resp:  17 17 (!) 26  Temp:  97.9 F (36.6 C) 97.9 F (36.6 C)   TempSrc:  Oral Oral   SpO2:  96% 94% 97%  Weight: 84.8 kg     Height: 6' (1.829 m)      Eyes: PERRL, lids and conjunctivae normal ENMT: Mucous membranes are dry. Posterior pharynx clear of any exudate or lesions.Normal  dentition.  Neck: normal, supple, no masses, no thyromegaly Respiratory: decreased air entry bilaterally with some rhonchi and mild crackles ormal respiratory effort. No accessory muscle use.  Cardiovascular: Regular rate and rhythm, no murmurs / rubs / gallops. No extremity edema. 2+ pedal pulses. No carotid bruits.  Abdomen: no tenderness, no masses palpated. No hepatosplenomegaly. Bowel sounds positive.  Urostomy bag in place Musculoskeletal: no clubbing / cyanosis. No joint deformity upper and lower extremities. Good ROM, no contractures. Normal muscle tone.  Skin: no rashes, lesions, ulcers. No induration Neurologic: CN 2-12 grossly intact. Sensation intact, DTR normal. Strength 5/5 in all 4.  Psychiatric: Normal judgment and insight. Alert and oriented x 3. Normal mood.     Labs on Admission: I have personally reviewed following labs and imaging studies  CBC: Recent Labs  Lab 03/31/20 1717 04/03/20 1302  WBC 4.4 3.4*  NEUTROABS  --  2.5  HGB 16.1 15.4  HCT 48.1 45.8  MCV 93.4 93.9  PLT 176 466*   Basic Metabolic Panel: Recent Labs  Lab 03/31/20 1717 04/03/20 1302  NA 135 133*  K 3.9 4.5  CL 101 105  CO2 22 17*  GLUCOSE 101* 122*  BUN 32* 57*  CREATININE 2.46* 3.70*  CALCIUM 8.2* 7.7*   GFR: Estimated Creatinine Clearance: 20.7 mL/min (A) (by C-G formula based on SCr of 3.7 mg/dL (H)). Liver Function Tests: Recent Labs  Lab 03/31/20 1717  AST 25  ALT 18  ALKPHOS 48  BILITOT 0.6  PROT 7.6  ALBUMIN 3.3*   Recent Labs  Lab 03/31/20 1717  LIPASE 58*   No results for input(s): AMMONIA in the last 168 hours. Coagulation Profile: No results for input(s): INR, PROTIME in the last 168 hours. Cardiac Enzymes: No results for input(s): CKTOTAL, CKMB, CKMBINDEX, TROPONINI in the last 168 hours. BNP (last 3 results) No results for input(s): PROBNP in the last 8760 hours. HbA1C: No results for input(s): HGBA1C in the last 72 hours. CBG: No results for  input(s): GLUCAP in the last 168 hours. Lipid Profile: No results for input(s): CHOL, HDL, LDLCALC, TRIG, CHOLHDL, LDLDIRECT in the last 72 hours. Thyroid Function Tests: No results for input(s): TSH, T4TOTAL, FREET4, T3FREE, THYROIDAB in the last 72 hours. Anemia Panel: No results for input(s): VITAMINB12, FOLATE, FERRITIN, TIBC, IRON, RETICCTPCT in the last 72 hours. Urine analysis: No results found for: COLORURINE, APPEARANCEUR, LABSPEC, PHURINE, GLUCOSEU, HGBUR, BILIRUBINUR, KETONESUR, PROTEINUR, UROBILINOGEN, NITRITE, LEUKOCYTESUR Sepsis Labs: @LABRCNTIP (procalcitonin:4,lacticidven:4) )No results found for this or any previous visit (from the past 240 hour(s)).   Radiological Exams on Admission: DG Chest 2 View  Result Date: 04/03/2020 CLINICAL DATA:  Shortness of breath with hemoptysis and cough EXAM: CHEST - 2 VIEW COMPARISON:  None. FINDINGS:  There is airspace opacity in the anterior segment of the right upper lobe. The lungs elsewhere are clear although mildly hyperexpanded. The heart size and pulmonary vascularity are normal. No adenopathy. No bone lesions. IMPRESSION: Airspace opacity consistent with pneumonia in the anterior segment right upper lobe. Lungs otherwise are clear although mildly hyperexpanded. Heart size normal. No adenopathy. Electronically Signed   By: Lowella Grip III M.D.   On: 04/03/2020 13:31    EKG: Independently reviewed.  Shows normal sinus rhythm with no significant ST changes.  Assessment/Plan Principal Problem:   CAP (community acquired pneumonia) Active Problems:   Essential hypertension   Hyperlipidemia   PVD (peripheral vascular disease) ([place])   COVID-19 virus infection   ARF (acute renal failure) ([place])     #1  Right lobar pneumonia: This may reflect secondary bacterial infection following Covid pneumonia.  The rest of the lung fields appear to be clear.  Echo also still reflect acute COVID-19 pneumonia.  Patient will be admitted and  treated for community-acquired pneumonia.  I will add IV steroids but hold off on remdesivir and other Covid 19 medications until infection is confirmed or not doing any better.  His symptoms 70 weeks old which by CDC criteria means his close to outside the window for infectiousness.  #2 essential hypertension: Continue home regimen  #3 COVID-19 infection: Again waiting for official confirmation tonight.  Patient reported active infection that started 3 weeks ago.  We will continue as per #1.  #4 acute on chronic kidney disease: Patient has baseline creatinine of somewhere around 1.9.  It was 2.4 earlier this year and now more than 3.  I suspect acute on chronic stage III disease.  We will hydrate patient and monitor renal function.  #5 history of bladder cancer: Patient has urostomy bag in place.  He is status post treatment.  #6 peripheral vascular disease: Continue his Plavix.  #7 thrombocytopenia: Could be related to infection.  Also leukopenia, This may reflect COVID-19 infection.  We will continue treatment.   DVT prophylaxis: Heparin Code Status: Full code Family Communication: Wife is at home Disposition Plan: Home Consults called: None Admission status: Inpatient  Severity of Illness: The appropriate patient status for this patient is INPATIENT. Inpatient status is judged to be reasonable and necessary in order to provide the required intensity of service to ensure the patient's safety. The patient's presenting symptoms, physical exam findings, and initial radiographic and laboratory data in the context of their chronic comorbidities is felt to place them at high risk for further clinical deterioration. Furthermore, it is not anticipated that the patient will be medically stable for discharge from the hospital within 2 midnights of admission. The following factors support the patient status of inpatient.   " The patient's presenting symptoms include shortness of breath and  cough. " The worrisome physical exam findings include coarse breath sound bilaterally with some rhonchi. " The initial radiographic and laboratory data are worrisome because of right upper lobe infiltrate. " The chronic co-morbidities include history of bladder cancer.   * I certify that at the point of admission it is my clinical judgment that the patient will require inpatient hospital care spanning beyond 2 midnights from the point of admission due to high intensity of service, high risk for further deterioration and high frequency of surveillance required.Barbette Merino MD [facility] Pager [phone]  If 7PM-7AM, please contact night-coverage [email or web address] Password [facility]  04/04/2020, 12:13 AM

## 2020-04-05 ENCOUNTER — Inpatient Hospital Stay: Payer: Medicare Other

## 2020-04-05 ENCOUNTER — Encounter: Payer: Self-pay | Admitting: Internal Medicine

## 2020-04-05 LAB — CBC WITH DIFFERENTIAL/PLATELET
Abs Immature Granulocytes: 0.04 10*3/uL (ref 0.00–0.07)
Basophils Absolute: 0 10*3/uL (ref 0.0–0.1)
Basophils Relative: 0 %
Eosinophils Absolute: 0 10*3/uL (ref 0.0–0.5)
Eosinophils Relative: 0 %
HCT: 44.8 % (ref 39.0–52.0)
Hemoglobin: 15.6 g/dL (ref 13.0–17.0)
Immature Granulocytes: 1 %
Lymphocytes Relative: 8 %
Lymphs Abs: 0.5 10*3/uL — ABNORMAL LOW (ref 0.7–4.0)
MCH: 31.6 pg (ref 26.0–34.0)
MCHC: 34.8 g/dL (ref 30.0–36.0)
MCV: 90.9 fL (ref 80.0–100.0)
Monocytes Absolute: 0.3 10*3/uL (ref 0.1–1.0)
Monocytes Relative: 5 %
Neutro Abs: 5.6 10*3/uL (ref 1.7–7.7)
Neutrophils Relative %: 86 %
Platelets: 148 10*3/uL — ABNORMAL LOW (ref 150–400)
RBC: 4.93 MIL/uL (ref 4.22–5.81)
RDW: 12.8 % (ref 11.5–15.5)
WBC: 6.5 10*3/uL (ref 4.0–10.5)
nRBC: 0 % (ref 0.0–0.2)

## 2020-04-05 LAB — LACTATE DEHYDROGENASE: LDH: 348 U/L — ABNORMAL HIGH (ref 98–192)

## 2020-04-05 LAB — COMPREHENSIVE METABOLIC PANEL
ALT: 35 U/L (ref 0–44)
AST: 58 U/L — ABNORMAL HIGH (ref 15–41)
Albumin: 2.8 g/dL — ABNORMAL LOW (ref 3.5–5.0)
Alkaline Phosphatase: 38 U/L (ref 38–126)
Anion gap: 9 (ref 5–15)
BUN: 57 mg/dL — ABNORMAL HIGH (ref 8–23)
CO2: 21 mmol/L — ABNORMAL LOW (ref 22–32)
Calcium: 8 mg/dL — ABNORMAL LOW (ref 8.9–10.3)
Chloride: 112 mmol/L — ABNORMAL HIGH (ref 98–111)
Creatinine, Ser: 2.21 mg/dL — ABNORMAL HIGH (ref 0.61–1.24)
GFR calc Af Amer: 34 mL/min — ABNORMAL LOW (ref >60)
GFR calc non Af Amer: 29 mL/min — ABNORMAL LOW (ref >60)
Glucose, Bld: 110 mg/dL — ABNORMAL HIGH (ref 70–99)
Potassium: 5.4 mmol/L — ABNORMAL HIGH (ref 3.5–5.1)
Sodium: 142 mmol/L (ref 135–145)
Total Bilirubin: 0.6 mg/dL (ref 0.3–1.2)
Total Protein: 6.7 g/dL (ref 6.5–8.1)

## 2020-04-05 LAB — HEMOGLOBIN AND HEMATOCRIT, BLOOD
HCT: 42.9 % (ref 39.0–52.0)
Hemoglobin: 14.2 g/dL (ref 13.0–17.0)

## 2020-04-05 LAB — POTASSIUM: Potassium: 5.1 mmol/L (ref 3.5–5.1)

## 2020-04-05 LAB — FERRITIN: Ferritin: 1813 ng/mL — ABNORMAL HIGH (ref 24–336)

## 2020-04-05 LAB — C-REACTIVE PROTEIN: CRP: 0.5 mg/dL (ref <1.0)

## 2020-04-05 LAB — MAGNESIUM: Magnesium: 2.4 mg/dL (ref 1.7–2.4)

## 2020-04-05 LAB — FIBRIN DERIVATIVES D-DIMER (ARMC ONLY): Fibrin derivatives D-dimer (ARMC): 1152.28 ng/mL (FEU) — ABNORMAL HIGH (ref 0.00–499.00)

## 2020-04-05 MED ORDER — SODIUM ZIRCONIUM CYCLOSILICATE 10 G PO PACK
10.0000 g | PACK | Freq: Once | ORAL | Status: AC
  Administered 2020-04-05: 09:00:00 10 g via ORAL
  Filled 2020-04-05: qty 1

## 2020-04-05 MED ORDER — IOHEXOL 350 MG/ML SOLN
75.0000 mL | Freq: Once | INTRAVENOUS | Status: AC | PRN
  Administered 2020-04-05: 16:00:00 75 mL via INTRAVENOUS

## 2020-04-05 MED ORDER — VITAMIN C 500 MG/5ML PO SYRP: 500.0000 mg | ORAL_SOLUTION | Freq: Every day | ORAL | Status: DC

## 2020-04-05 MED ORDER — SODIUM CHLORIDE 0.9 % IV SOLN
200.0000 mg | Freq: Once | INTRAVENOUS | Status: AC
  Administered 2020-04-05: 200 mg via INTRAVENOUS
  Filled 2020-04-05: qty 200

## 2020-04-05 MED ORDER — METHYLPREDNISOLONE SODIUM SUCC 125 MG IJ SOLR
60.0000 mg | Freq: Two times a day (BID) | INTRAMUSCULAR | Status: AC
  Administered 2020-04-05 – 2020-04-07 (×5): 60 mg via INTRAVENOUS
  Filled 2020-04-05 (×5): qty 2

## 2020-04-05 MED ORDER — SODIUM CHLORIDE 0.9 % IV SOLN
100.0000 mg | Freq: Every day | INTRAVENOUS | Status: AC
  Administered 2020-04-06 – 2020-04-09 (×4): 100 mg via INTRAVENOUS
  Filled 2020-04-05 (×4): qty 20

## 2020-04-05 MED ORDER — ZINC SULFATE 220 (50 ZN) MG PO CAPS
220.0000 mg | ORAL_CAPSULE | Freq: Every day | ORAL | Status: DC
  Administered 2020-04-05 – 2020-04-13 (×9): 220 mg via ORAL
  Filled 2020-04-05 (×9): qty 1

## 2020-04-05 NOTE — Progress Notes (Signed)
On assessment this morning patient had blood tinged sputum. He has been coughing up sputum all day. Notified Dr Posey Pronto she stated to hold aspirin, heparin, and plavix. Held evening doses. She ordered CTA for patient. Transport came and took patient at 1500. He is in now resting in bed with call bell in reach.

## 2020-04-05 NOTE — Progress Notes (Signed)
PROGRESS NOTE    Martin Jennings  QQP:[id number] DOB: [birth year] DOA: 04/03/2020 PCP: System, Provider Not In   Brief Narrative: SIAOSI Jennings is a 69 y.o. male with medical history significant of bladder cancer, hypertension, peripheral vascular disease, hyperlipidemia who apparently contracted COVID-19 infection with his wife 3 weeks ago from the daughter.  The daughter is no longer living with them.  He and his wife have been at home trying to cope with it.  Usually supportive care.  They have not been symptomatic since then including the wife who is still homesick.  In the last few days he is getting more progressively short of breath weak and tired.  Patient came to the ER where he was seen and evaluated.  He is hypoxic in the ER requiring oxygen now.  Chest x-ray showed right upper lobe pneumonia.  His Covid testing is pending at this point.  Patient suspected to have secondary bacterial pneumonia as a result of the COVID-19 infection.  He is being admitted to the hospital for further evaluation and treatment..  ED Course: Temperature 98.9 blood pressure 104/63 pulse 80 respiratory rate of 26 oxygen sats 94% on 2 L.  White count 3.4 hemoglobin 15.4 platelets 138.  Sodium 133 potassium 4.5 chloride 105 CO2 of 17 BUN 57 creatinine 3.70 and calcium 7.7 glucose 122.  Chest x-ray showed Space opacity consistent with pneumonia in the anterior segment of the right upper lobe.  Otherwise clear bilaterally.  Patient is being admitted with lobar pneumonia probably related to COVID-19 infection.   Assessment & Plan:   Principal Problem:   CAP (community acquired pneumonia) Active Problems:   Essential hypertension   Hyperlipidemia   PVD (peripheral vascular disease) ([place])   COVID-19 virus infection   ARF (acute renal failure) ([place])  CAP/Right lobar pneumonia: This may reflect secondary bacterial infection following Covid pneumonia.  The rest of the lung fields appear to be clear.  Echo also still  reflect acute COVID-19 pneumonia.  Patient will be admitted and treated for community-acquired pneumonia.  I will add IV steroids but hold off on remdesivir and other Covid 19 medications until infection is confirmed or not doing any better.  His symptoms 58 weeks old which by CDC criteria means his close to outside the window for infectiousness.  Covid-19 infection: We will start pt on remdesivir protocol / vit c and zinc.  We will start solumedrol 60 mg every 12 h.   Hemoptysis: We will hold asa and plavix  And obtain cta chest and consider pulmonary consult.   Thrombocytopenia: stable and platelet count is  Stable at 146.  DVT prophylaxis: SCD;s held due to hemoptysis.  Code Status: Full Code Family Communication: Son  Disposition Plan: D/c in 3-5 days to STR after pt eval.    Status is: Inpatient  Not inpatient appropriate, will call UM team and downgrade to OBS.   Dispo: The patient is from: Home              Anticipated d/c is to: Home              Anticipated d/c date is: > 3 days              Patient currently is not medically stable to d/c. Subjective: Pt seen today he is ill appearing. States he feels little better and doesn't t complaints of any chest pain or sob or any other complaints but looks more tired.     Objective: Vitals:  04/04/20 2348 04/05/20 0451 04/05/20 0728 04/05/20 1133  BP: (!) 109/54 (!) 110/57 121/61 129/62  Pulse: 62 62 69 76  Resp: 16 16 (!) 22 20  Temp: 97.9 F (36.6 C) 97.8 F (36.6 C) 98.2 F (36.8 C) 98.8 F (37.1 C)  TempSrc: Oral Oral Oral Oral  SpO2: 97% 96% 95% 96%  Weight:      Height:        Intake/Output Summary (Last 24 hours) at 04/05/2020 1216 Last data filed at 04/05/2020 0700 Gross per 24 hour  Intake --  Output 1450 ml  Net -1450 ml   Filed Weights   04/03/20 1257 04/04/20 2048  Weight: 84.8 kg 74.9 kg    Examination: Blood pressure 129/62, pulse 76, temperature 98.8 F (37.1 C), temperature source Oral,  resp. rate 20, height 5\' 11"  (1.803 m), weight 74.9 kg, SpO2 96 %. General exam: Appears calm and comfortable  Respiratory system: Clear to auscultation. Respiratory effort normal. Cardiovascular system: S1 & S2 heard, RRR. No JVD, murmurs, rubs, gallops or clicks. No pedal edema. Gastrointestinal system: Abdomen is nondistended, soft and nontender. No organomegaly or masses felt. Normal bowel sounds heard. Central nervous system: Alert and oriented. No focal neurological deficits. Extremities: Symmetric 5 x 5 power. Skin: No rashes, lesions or ulcers Psychiatry: Judgement and insight appear normal. Mood & affect appropriate.   Data Reviewed: I have personally reviewed following labs and imaging studies  I/O last 3 completed shifts: In: -  Out: 1850 [Urine:1850] No intake/output data recorded. Lab Results  Component Value Date   CREATININE 2.21 (H) 04/05/2020   CREATININE 2.79 (H) 04/04/2020   CREATININE 3.70 (H) 04/03/2020   CBC: Recent Labs  Lab 03/31/20 1717 04/03/20 1302 04/04/20 1036 04/05/20 0531  WBC 4.4 3.4* 2.9* 6.5  NEUTROABS  --  2.5 2.3 5.6  HGB 16.1 15.4 15.0 15.6  HCT 48.1 45.8 42.4 44.8  MCV 93.4 93.9 88.7 90.9  PLT 176 138* 146* 785*   Basic Metabolic Panel: Recent Labs  Lab 03/31/20 1717 04/03/20 1302 04/04/20 1036 04/05/20 0531 04/05/20 0805  NA 135 133* 136 142  --   K 3.9 4.5 5.0 5.4* 5.1  CL 101 105 108 112*  --   CO2 22 17* 21* 21*  --   GLUCOSE 101* 122* 143* 110*  --   BUN 32* 57* 64* 57*  --   CREATININE 2.46* 3.70* 2.79* 2.21*  --   CALCIUM 8.2* 7.7* 7.5* 8.0*  --   MG  --   --  2.2 2.4  --    GFR: Estimated Creatinine Clearance: 33.4 mL/min (A) (by C-G formula based on SCr of 2.21 mg/dL (H)). Liver Function Tests: Recent Labs  Lab 03/31/20 1717 04/04/20 1036 04/05/20 0531  AST 25 24 58*  ALT 18 17 35  ALKPHOS 48 42 38  BILITOT 0.6 0.5 0.6  PROT 7.6 6.5 6.7  ALBUMIN 3.3* 2.6* 2.8*   Recent Labs  Lab 03/31/20 1717   LIPASE 58*   Anemia Panel: Recent Labs    04/05/20 0531  FERRITIN 1,813*   Sepsis Labs: Recent Labs  Lab 04/03/20 1301 04/03/20 2331 04/03/20 2332  PROCALCITON  --  0.50  --   LATICACIDVEN 1.2  --  1.3    Recent Results (from the past 240 hour(s))  Blood culture (routine x 2)     Status: None (Preliminary result)   Collection Time: 04/03/20 11:31 PM   Specimen: BLOOD  Result Value Ref Range Status  Specimen Description BLOOD LEFT ANTECUBITAL  Final   Special Requests   Final    BOTTLES DRAWN AEROBIC AND ANAEROBIC Blood Culture adequate volume   Culture   Final    NO GROWTH 2 DAYS Performed at [facility], [address] [place], [place] [address]    Report Status PENDING  Incomplete  SARS Coronavirus 2 by RT PCR (hospital order, performed in [facility] hospital lab) Nasopharyngeal Nasopharyngeal Swab     Status: Abnormal   Collection Time: 04/03/20 11:31 PM   Specimen: Nasopharyngeal Swab  Result Value Ref Range Status   SARS Coronavirus 2 POSITIVE (A) NEGATIVE Final    Comment: RESULT CALLED TO, READ BACK BY AND VERIFIED WITH: CHRISSY BRAND RN 0129 04/04/20 HNM (NOTE) SARS-CoV-2 target nucleic acids are DETECTED  SARS-CoV-2 RNA is generally detectable in upper respiratory specimens  during the acute phase of infection.  Positive results are indicative  of the presence of the identified virus, but do not rule out bacterial infection or co-infection with other pathogens not detected by the test.  Clinical correlation with patient history and  other diagnostic information is necessary to determine patient infection status.  The expected result is negative.  Fact Sheet for Patients:   StrictlyIdeas.no   Fact Sheet for Healthcare Providers:   BankingDealers.co.za    This test is not yet approved or cleared by the [place] FDA and  has been authorized for detection and/or diagnosis of SARS-CoV-2  by FDA under an Emergency Use Authorization (EUA).  This EUA will remain in effect (meaning this tes t can be used) for the duration of  the COVID-19 declaration under Section 564(b)(1) of the Act, 21 U.S.C. section 360-bbb-3(b)(1), unless the authorization is terminated or revoked sooner.  Performed at [facility], [address] [place], [place] [address]   Blood culture (routine x 2)     Status: None (Preliminary result)   Collection Time: 04/03/20 11:32 PM   Specimen: BLOOD  Result Value Ref Range Status   Specimen Description BLOOD BLOOD LEFT HAND  Final   Special Requests   Final    BOTTLES DRAWN AEROBIC AND ANAEROBIC Blood Culture adequate volume   Culture   Final    NO GROWTH 2 DAYS Performed at [facility], [address] [place], [place] [address]    Report Status PENDING  Incomplete  Culture, sputum-assessment     Status: None   Collection Time: 04/04/20  4:35 AM   Specimen: Expectorated Sputum  Result Value Ref Range Status   Specimen Description EXPECTORATED SPUTUM  Final   Special Requests NONE  Final   Sputum evaluation   Final    Sputum specimen not acceptable for testing.  Please recollect.   Yancey Flemings RN AT [id number] ON 04/04/20 [facility] Performed at [facility], [address] [place], [place] [address]    Report Status 04/04/2020 FINAL  Final     Radiology Studies: DG Chest 2 View  Result Date: 04/03/2020 CLINICAL DATA:  Shortness of breath with hemoptysis and cough EXAM: CHEST - 2 VIEW COMPARISON:  None. FINDINGS: There is airspace opacity in the anterior segment of the right upper lobe. The lungs elsewhere are clear although mildly hyperexpanded. The heart size and pulmonary vascularity are normal. No adenopathy. No bone lesions. IMPRESSION: Airspace opacity consistent with pneumonia in the anterior segment right upper lobe. Lungs otherwise are clear although mildly hyperexpanded. Heart size normal. No adenopathy.  Electronically Signed   By:  Lowella Grip III M.D.   On: 04/03/2020 13:31   Scheduled Meds: . amLODipine  10 mg Oral Daily  . atorvastatin  20 mg Oral QPM  . carvedilol  6.25 mg Oral BID WC  . clopidogrel  75 mg Oral Daily  . heparin  5,000 Units Subcutaneous Q8H   Continuous Infusions: . sodium chloride    . azithromycin Stopped (04/05/20 0700)  . cefTRIAXone (ROCEPHIN)  IV Stopped (04/05/20 0700)     LOS: 1 day   Para Skeans, MD [facility] Pager [phone] If 7PM-7AM, please contact night-coverage [email or web address] Password [facility] 04/05/2020, 12:16 PM

## 2020-04-05 NOTE — Progress Notes (Signed)
Patient resting in bedside chair. He is alert and oriented x3. He ambulates with minimal assistance. Currently he has no complaints or distress. O2 is currently at 5L [place]. Call bell in reach. Instructed patient not to get up without calling for help first.

## 2020-04-05 NOTE — Progress Notes (Signed)
Remdesivir - Pharmacy Brief Note   O:  ALT: 35 CXR: Airspace opacity consistent with pneumonia in the anterior segment right upper lobe. Lungs otherwise are clear although mildly hyperexpanded. Heart size normal. No adenopathy. SpO2: 95% on 5 LNC   A/P:  Remdesivir 200 mg IVPB once followed by 100 mg IVPB daily x 4 days.   Dorena Bodo, PharmD 04/05/2020 12:37 PM

## 2020-04-06 LAB — COMPREHENSIVE METABOLIC PANEL
ALT: 42 U/L (ref 0–44)
AST: 46 U/L — ABNORMAL HIGH (ref 15–41)
Albumin: 2.7 g/dL — ABNORMAL LOW (ref 3.5–5.0)
Alkaline Phosphatase: 43 U/L (ref 38–126)
Anion gap: 9 (ref 5–15)
BUN: 50 mg/dL — ABNORMAL HIGH (ref 8–23)
CO2: 21 mmol/L — ABNORMAL LOW (ref 22–32)
Calcium: 8 mg/dL — ABNORMAL LOW (ref 8.9–10.3)
Chloride: 111 mmol/L (ref 98–111)
Creatinine, Ser: 1.9 mg/dL — ABNORMAL HIGH (ref 0.61–1.24)
GFR calc Af Amer: 41 mL/min — ABNORMAL LOW (ref >60)
GFR calc non Af Amer: 35 mL/min — ABNORMAL LOW (ref >60)
Glucose, Bld: 144 mg/dL — ABNORMAL HIGH (ref 70–99)
Potassium: 5 mmol/L (ref 3.5–5.1)
Sodium: 141 mmol/L (ref 135–145)
Total Bilirubin: 0.3 mg/dL (ref 0.3–1.2)
Total Protein: 6.5 g/dL (ref 6.5–8.1)

## 2020-04-06 LAB — CBC WITH DIFFERENTIAL/PLATELET
Abs Immature Granulocytes: 0.03 10*3/uL (ref 0.00–0.07)
Basophils Absolute: 0 10*3/uL (ref 0.0–0.1)
Basophils Relative: 0 %
Eosinophils Absolute: 0 10*3/uL (ref 0.0–0.5)
Eosinophils Relative: 0 %
HCT: 43.1 % (ref 39.0–52.0)
Hemoglobin: 14.7 g/dL (ref 13.0–17.0)
Immature Granulocytes: 1 %
Lymphocytes Relative: 9 %
Lymphs Abs: 0.3 10*3/uL — ABNORMAL LOW (ref 0.7–4.0)
MCH: 31.2 pg (ref 26.0–34.0)
MCHC: 34.1 g/dL (ref 30.0–36.0)
MCV: 91.5 fL (ref 80.0–100.0)
Monocytes Absolute: 0.2 10*3/uL (ref 0.1–1.0)
Monocytes Relative: 5 %
Neutro Abs: 2.9 10*3/uL (ref 1.7–7.7)
Neutrophils Relative %: 85 %
Platelets: 149 10*3/uL — ABNORMAL LOW (ref 150–400)
RBC: 4.71 MIL/uL (ref 4.22–5.81)
RDW: 12.7 % (ref 11.5–15.5)
WBC: 3.4 10*3/uL — ABNORMAL LOW (ref 4.0–10.5)
nRBC: 0 % (ref 0.0–0.2)

## 2020-04-06 LAB — LACTATE DEHYDROGENASE: LDH: 325 U/L — ABNORMAL HIGH (ref 98–192)

## 2020-04-06 LAB — FIBRIN DERIVATIVES D-DIMER (ARMC ONLY): Fibrin derivatives D-dimer (ARMC): 807.64 ng/mL (FEU) — ABNORMAL HIGH (ref 0.00–499.00)

## 2020-04-06 LAB — FERRITIN: Ferritin: 1617 ng/mL — ABNORMAL HIGH (ref 24–336)

## 2020-04-06 LAB — C-REACTIVE PROTEIN: CRP: 2 mg/dL — ABNORMAL HIGH (ref <1.0)

## 2020-04-06 MED ORDER — AZITHROMYCIN 500 MG PO TABS
500.0000 mg | ORAL_TABLET | Freq: Every day | ORAL | Status: AC
  Administered 2020-04-06 – 2020-04-07 (×2): 500 mg via ORAL
  Filled 2020-04-06 (×2): qty 1

## 2020-04-06 MED ORDER — ENSURE ENLIVE PO LIQD
237.0000 mL | Freq: Three times a day (TID) | ORAL | Status: DC
  Administered 2020-04-06 – 2020-04-12 (×16): 237 mL via ORAL

## 2020-04-06 NOTE — Progress Notes (Signed)
PROGRESS NOTE    Martin Jennings  PZW:[id number] DOB: October 09, 1950 DOA: 04/03/2020 PCP: System, Provider Not In   Brief Narrative: Martin Jennings is a 69 y.o. male with medical history significant of bladder cancer, hypertension, peripheral vascular disease, hyperlipidemia who apparently contracted COVID-19 infection with his wife 3 weeks ago from the daughter.  The daughter is no longer living with them.  He and his wife have been at home trying to cope with it.  Usually supportive care.  They have not been symptomatic since then including the wife who is still homesick.  In the last few days he is getting more progressively short of breath weak and tired.  Patient came to the ER where he was seen and evaluated.  He is hypoxic in the ER requiring oxygen now.  Chest x-ray showed right upper lobe pneumonia.  His Covid testing is pending at this point.  Patient suspected to have secondary bacterial pneumonia as a result of the COVID-19 infection.  He is being admitted to the hospital for further evaluation and treatment..  ED Course: Temperature 98.9 blood pressure 104/63 pulse 80 respiratory rate of 26 oxygen sats 94% on 2 L.  White count 3.4 hemoglobin 15.4 platelets 138.  Sodium 133 potassium 4.5 chloride 105 CO2 of 17 BUN 57 creatinine 3.70 and calcium 7.7 glucose 122.  Chest x-ray showed Space opacity consistent with pneumonia in the anterior segment of the right upper lobe.  Otherwise clear bilaterally.  Patient is being admitted with lobar pneumonia probably related to COVID-19 infection.   Assessment & Plan:   Principal Problem:   CAP (community acquired pneumonia) Active Problems:   Essential hypertension   Hyperlipidemia   PVD (peripheral vascular disease) ([place])   COVID-19 virus infection   ARF (acute renal failure) ([place])  CAP/Right lobar pneumonia: This may reflect secondary bacterial infection following Covid pneumonia.  The rest of the lung fields appear to be clear.  Echo also still  reflect acute COVID-19 pneumonia.  Patient will be admitted and treated for community-acquired pneumonia.  I will add IV steroids but hold off on remdesivir and other Covid 19 medications until infection is confirmed or not doing any better.  His symptoms 79 weeks old which by CDC criteria means his close to outside the window for infectiousness.  Covid-19 infection: We will start pt on remdesivir protocol / vit c and zinc.  We will start solumedrol 60 mg every 12 h.  9/12 Pt continued on remdesivir and solumedrol.  SpO2: 93 % O2 Flow Rate (L/min): 4 L/min COVID-19 Labs  Recent Labs    04/03/20 2331 04/05/20 0531 04/06/20 0453  FERRITIN  --  1,813* 1,617*  LDH  --  348* 325*  CRP 2.8* 0.5 2.0*   Lab Results  Component Value Date   SARSCOV2NAA POSITIVE (A) 04/03/2020   Hemoptysis: We will hold asa and plavix  And obtain cta chest and consider pulmonary consult. Stable CTA found lung mass c/w cancer.d/w family and pt and consulted pulm and oncology.   Thrombocytopenia: stable and platelet count is  Stable at 146. Labs pending.   DVT prophylaxis: SCD;s held due to hemoptysis.  Code Status: Full Code Family Communication: Son  Disposition Plan: D/c in 3-5 days to STR after pt eval.    Status is: Inpatient Not inpatient appropriate, will call UM team and downgrade to OBS.   Dispo: The patient is from: Home              Anticipated  d/c is to: Home              Anticipated d/c date is: > 3 days              Patient currently is not medically stable to d/c. Subjective: 9/11Pt seen today he is ill appearing. States he feels little better and doesn't t complaints of any chest pain or sob or any other complaints but looks more tired.   9/12 Pt is alert/awake/oriented and reports cough/sob/ hemoptysis . D/w him cta results and family about this unfortunate news. Pt has a h/o smoking.   Objective: Vitals:   04/05/20 2011 04/06/20 0451 04/06/20 0743 04/06/20 1123  BP: (!)  126/55 127/64 (!) 154/79 (!) 144/81  Pulse: 67 68 67 71  Resp: 18 18 (!) 23 20  Temp: 98.5 F (36.9 C) 97.8 F (36.6 C) 97.7 F (36.5 C) 98.5 F (36.9 C)  TempSrc: Oral  Oral   SpO2: 96% 96% 97% 93%  Weight:      Height:        Intake/Output Summary (Last 24 hours) at 04/06/2020 1549 Last data filed at 04/06/2020 0500 Gross per 24 hour  Intake 1480 ml  Output --  Net 1480 ml   Filed Weights   04/03/20 1257 04/04/20 2048  Weight: 84.8 kg 74.9 kg    Examination: Blood pressure (!) 144/81, pulse 71, temperature 98.5 F (36.9 C), resp. rate 20, height 5\' 11"  (1.803 m), weight 74.9 kg, SpO2 93 %. General exam: Appears calm and comfortable  Respiratory system: Clear to auscultation. Respiratory effort normal. Cardiovascular system: S1 & S2 heard, RRR. No JVD, murmurs, rubs, gallops or clicks. No pedal edema. Gastrointestinal system: Abdomen is nondistended, soft and nontender. No organomegaly or masses felt. Normal bowel sounds heard. Central nervous system: Alert and oriented. No focal neurological deficits. Extremities: Symmetric 5 x 5 power. Skin: No rashes, lesions or ulcers Psychiatry: Judgement and insight appear normal. Mood & affect appropriate.   Data Reviewed: I have personally reviewed following labs and imaging studies  I/O last 3 completed shifts: In: 3785 [P.O.:1200; IV Piggyback:640] Out: 1050 [Urine:1050] No intake/output data recorded. Lab Results  Component Value Date   CREATININE 1.90 (H) 04/06/2020   CREATININE 2.21 (H) 04/05/2020   CREATININE 2.79 (H) 04/04/2020   CBC: Recent Labs  Lab 03/31/20 1717 03/31/20 1717 04/03/20 1302 04/04/20 1036 04/05/20 0531 04/05/20 1917 04/06/20 0453  WBC 4.4  --  3.4* 2.9* 6.5  --  3.4*  NEUTROABS  --   --  2.5 2.3 5.6  --  2.9  HGB 16.1   < > 15.4 15.0 15.6 14.2 14.7  HCT 48.1   < > 45.8 42.4 44.8 42.9 43.1  MCV 93.4  --  93.9 88.7 90.9  --  91.5  PLT 176  --  138* 146* 148*  --  149*   < > = values in  this interval not displayed.   Basic Metabolic Panel: Recent Labs  Lab 03/31/20 1717 03/31/20 1717 04/03/20 1302 04/04/20 1036 04/05/20 0531 04/05/20 0805 04/06/20 0453  NA 135  --  133* 136 142  --  141  K 3.9   < > 4.5 5.0 5.4* 5.1 5.0  CL 101  --  105 108 112*  --  111  CO2 22  --  17* 21* 21*  --  21*  GLUCOSE 101*  --  122* 143* 110*  --  144*  BUN 32*  --  57* 64*  57*  --  50*  CREATININE 2.46*  --  3.70* 2.79* 2.21*  --  1.90*  CALCIUM 8.2*  --  7.7* 7.5* 8.0*  --  8.0*  MG  --   --   --  2.2 2.4  --   --    < > = values in this interval not displayed.   GFR: Estimated Creatinine Clearance: 38.9 mL/min (A) (by C-G formula based on SCr of 1.9 mg/dL (H)). Liver Function Tests: Recent Labs  Lab 03/31/20 1717 04/04/20 1036 04/05/20 0531 04/06/20 0453  AST 25 24 58* 46*  ALT 18 17 35 42  ALKPHOS 48 42 38 43  BILITOT 0.6 0.5 0.6 0.3  PROT 7.6 6.5 6.7 6.5  ALBUMIN 3.3* 2.6* 2.8* 2.7*   Recent Labs  Lab 03/31/20 1717  LIPASE 58*   Anemia Panel: Recent Labs    04/05/20 0531 04/06/20 0453  FERRITIN 1,813* 1,617*   Sepsis Labs: Recent Labs  Lab 04/03/20 1301 04/03/20 2331 04/03/20 2332  PROCALCITON  --  0.50  --   LATICACIDVEN 1.2  --  1.3    Recent Results (from the past 240 hour(s))  Blood culture (routine x 2)     Status: None (Preliminary result)   Collection Time: 04/03/20 11:31 PM   Specimen: BLOOD  Result Value Ref Range Status   Specimen Description BLOOD LEFT ANTECUBITAL  Final   Special Requests   Final    BOTTLES DRAWN AEROBIC AND ANAEROBIC Blood Culture adequate volume   Culture   Final    NO GROWTH 3 DAYS Performed at [facility], [address] [place], [place] [address]    Report Status PENDING  Incomplete  SARS Coronavirus 2 by RT PCR (hospital order, performed in [place] hospital lab) Nasopharyngeal Nasopharyngeal Swab     Status: Abnormal   Collection Time: 04/03/20 11:31 PM   Specimen: Nasopharyngeal Swab   Result Value Ref Range Status   SARS Coronavirus 2 POSITIVE (A) NEGATIVE Final    Comment: RESULT CALLED TO, READ BACK BY AND VERIFIED WITH: CHRISSY BRAND RN 0129 04/04/20 [facility] (NOTE) SARS-CoV-2 target nucleic acids are DETECTED  SARS-CoV-2 RNA is generally detectable in upper respiratory specimens  during the acute phase of infection.  Positive results are indicative  of the presence of the identified virus, but do not rule out bacterial infection or co-infection with other pathogens not detected by the test.  Clinical correlation with patient history and  other diagnostic information is necessary to determine patient infection status.  The expected result is negative.  Fact Sheet for Patients:   StrictlyIdeas.no   Fact Sheet for Healthcare Providers:   BankingDealers.co.za    This test is not yet approved or cleared by the [place] FDA and  has been authorized for detection and/or diagnosis of SARS-CoV-2 by FDA under an Emergency Use Authorization (EUA).  This EUA will remain in effect (meaning this tes t can be used) for the duration of  the COVID-19 declaration under Section 564(b)(1) of the Act, 21 U.S.C. section 360-bbb-3(b)(1), unless the authorization is terminated or revoked sooner.  Performed at [facility], [address] [place], [place] [address]   Blood culture (routine x 2)     Status: None (Preliminary result)   Collection Time: 04/03/20 11:32 PM   Specimen: BLOOD  Result Value Ref Range Status   Specimen Description BLOOD BLOOD LEFT HAND  Final   Special Requests   Final    BOTTLES DRAWN  AEROBIC AND ANAEROBIC Blood Culture adequate volume   Culture   Final    NO GROWTH 3 DAYS Performed at [facility], [address] [place], [place] [address]    Report Status PENDING  Incomplete  Culture, sputum-assessment     Status: None   Collection Time: 04/04/20  4:35 AM   Specimen:  Expectorated Sputum  Result Value Ref Range Status   Specimen Description EXPECTORATED SPUTUM  Final   Special Requests NONE  Final   Sputum evaluation   Final    Sputum specimen not acceptable for testing.  Please recollect.   Yancey Flemings RN AT 2426 ON 04/04/20 [facility] Performed at [facility], [address] [place], [place] [address]    Report Status 04/04/2020 FINAL  Final     Radiology Studies: CT ANGIO CHEST PE W OR WO CONTRAST  Result Date: 04/05/2020 CLINICAL DATA:  69 year old with COVID-19 pneumonia with increasing shortness of breath and oxygen requirements. Personal history of bladder cancer originally diagnosed in 2012. EXAM: CT ANGIOGRAPHY CHEST WITH CONTRAST TECHNIQUE: Multidetector CT imaging of the chest was performed using the standard protocol during bolus administration of intravenous contrast. Multiplanar CT image reconstructions and MIPs were obtained to evaluate the vascular anatomy. CONTRAST:  15mL OMNIPAQUE IOHEXOL 350 MG/ML IV. COMPARISON:  CT chest 06/29/2016. FINDINGS: Cardiovascular: Contrast opacification of pulmonary arteries is very good. No filling defects within either main pulmonary artery or their segmental branches in either lung to suggest pulmonary embolism. Normal heart size. Moderate LAD and LEFT circumflex coronary atherosclerosis. Moderate atherosclerosis involving the thoracic and upper abdominal aorta with calcified and noncalcified plaque. No evidence of aortic aneurysm. Mediastinum/Nodes: Extensive lymphadenopathy, including: -Conglomerate station 2R and 4R mediastinal nodes measuring approximately 2.9 x 2.7 x 5.0 cm. -Station 4L mediastinal nodes measuring approximately 3.1 x 2.1 x 2.9 cm -Conglomerate station 7 subcarinal mediastinal nodes measuring approximately 2.9 x 5.9 x 7.6 cm. -Conglomerate RIGHT hilar nodes measuring approximately 4.2 x 2.5 x 3.3 cm. -Borderline enlarged LEFT hilar nodes measuring 3.1 x 1.8 x 3.0 cm. -Scattered  nodes are present elsewhere throughout the mediastinum. Normal appearing esophagus. Small hiatal hernia. Visualized thyroid gland normal in appearance. Lungs/Pleura: Masslike opacity with associated architectural distortion involving the RIGHT UPPER LOBE measuring in total approximately 5.7 x 2.9 x 2.3 cm (6/34 and 7/44). Marked septal thickening involving the RIGHT UPPER LOBE, with associated ground-glass opacities posteriorly. Peripheral ground-glass opacities are present in the RIGHT LOWER LOBE, LEFT UPPER LOBE and LEFT LOWER LOBE. No pleural effusions. Central airways patent. Upper Abdomen: Mass involving the LEFT adrenal gland measuring approximately 2.0 x 3.3 cm. Small RIGHT adrenal nodule measuring approximately 1.1 x 1.7 cm. Multiple vague low-attenuation liver lesions are suspected on this early arterial phase imaging. Approximate 8 mm gallstone in the contracted gallbladder. No pericholecystic inflammation. Musculoskeletal: Mild degenerative changes involving the thoracic spine. No visible osseous metastases. Review of the MIP images confirms the above findings. IMPRESSION: 1. No evidence of pulmonary embolism. 2. Masslike opacity with associated architectural distortion involving the RIGHT UPPER LOBE with maximum measurement of approximately 5.7 cm, likely indicating a primary bronchogenic carcinoma. 3. Extensive mediastinal and bilateral hilar lymphadenopathy. Index conglomerate nodes are measured above. 4. Peripheral ground-glass opacities in the RIGHT UPPER LOBE, RIGHT LOWER LOBE, LEFT UPPER LOBE and LEFT LOWER LOBE. There are a spectrum of findings in the lungs which can be seen with acute atypical infection (as well as other non-infectious etiologies). In particular, viral pneumonia (including COVID-19) should  be considered in the appropriate clinical setting. 5. Possible lymphangitic carcinomatosis involving the RIGHT UPPER LOBE. 6. Bilateral adrenal masses, measured above, likely indicating  metastatic disease. 7. Multiple vague liver metastases are suspected on this early arterial phase imaging. A dedicated CT of the abdomen and pelvis is recommended to confirm or deny these findings. I would recommend waiting 24 hours before readministering the intravenous contrast. 8. Cholelithiasis without evidence of acute cholecystitis. 9. Small hiatal hernia. Aortic Atherosclerosis (ICD10-I70.0). Electronically Signed   By: Evangeline Dakin M.D.   On: 04/05/2020 16:54   Scheduled Meds: . amLODipine  10 mg Oral Daily  . atorvastatin  20 mg Oral QPM  . azithromycin  500 mg Oral Daily  . carvedilol  6.25 mg Oral BID WC  . heparin  5,000 Units Subcutaneous Q8H  . methylPREDNISolone (SOLU-MEDROL) injection  60 mg Intravenous Q12H  . zinc sulfate  220 mg Oral Daily   Continuous Infusions: . sodium chloride    . cefTRIAXone (ROCEPHIN)  IV Stopped (04/06/20 7116)  . remdesivir 100 mg in NS 100 mL 100 mg (04/06/20 0814)     LOS: 2 days   Para Skeans, MD [facility] Pager [phone] If 7PM-7AM, please contact night-coverage [email or web address] Password [email or web address] 04/06/2020, 3:49 PM

## 2020-04-06 NOTE — Progress Notes (Signed)
PHARMACIST - PHYSICIAN COMMUNICATION DR:   Florina Ou CONCERNING: Antibiotic IV to Oral Route Change Policy  RECOMMENDATION: This patient is receiving azithromycin by the intravenous route.  Based on criteria approved by the Pharmacy and Therapeutics Committee, the antibiotic(s) is/are being converted to the equivalent oral dose form(s).   DESCRIPTION: These criteria include:  Patient being treated for a respiratory tract infection, urinary tract infection, cellulitis or clostridium difficile associated diarrhea if on metronidazole  The patient is not neutropenic and does not exhibit a GI malabsorption state  The patient is eating (either orally or via tube) and/or has been taking other orally administered medications for a least 24 hours  The patient is improving clinically and has a Tmax < 100.5  If you have questions about this conversion, please contact the [place], PharmD, BCPS Clinical Pharmacist 04/06/2020 1:44 PM

## 2020-04-06 NOTE — Progress Notes (Signed)
Spoke with DR about holding heparin dose at 1400. She stated to hold for now and apply SCDs to patient legs. He is resting with call bell in reach.

## 2020-04-07 DIAGNOSIS — R042 Hemoptysis: Secondary | ICD-10-CM

## 2020-04-07 DIAGNOSIS — R590 Localized enlarged lymph nodes: Secondary | ICD-10-CM

## 2020-04-07 DIAGNOSIS — J9601 Acute respiratory failure with hypoxia: Secondary | ICD-10-CM

## 2020-04-07 DIAGNOSIS — U071 COVID-19: Principal | ICD-10-CM

## 2020-04-07 DIAGNOSIS — R918 Other nonspecific abnormal finding of lung field: Secondary | ICD-10-CM

## 2020-04-07 LAB — CBC WITH DIFFERENTIAL/PLATELET
Abs Immature Granulocytes: 0.03 10*3/uL (ref 0.00–0.07)
Basophils Absolute: 0 10*3/uL (ref 0.0–0.1)
Basophils Relative: 0 %
Eosinophils Absolute: 0 10*3/uL (ref 0.0–0.5)
Eosinophils Relative: 0 %
HCT: 46.3 % (ref 39.0–52.0)
Hemoglobin: 15.4 g/dL (ref 13.0–17.0)
Immature Granulocytes: 1 %
Lymphocytes Relative: 6 %
Lymphs Abs: 0.3 10*3/uL — ABNORMAL LOW (ref 0.7–4.0)
MCH: 31 pg (ref 26.0–34.0)
MCHC: 33.3 g/dL (ref 30.0–36.0)
MCV: 93.2 fL (ref 80.0–100.0)
Monocytes Absolute: 0.3 10*3/uL (ref 0.1–1.0)
Monocytes Relative: 6 %
Neutro Abs: 4.9 10*3/uL (ref 1.7–7.7)
Neutrophils Relative %: 87 %
Platelets: 170 10*3/uL (ref 150–400)
RBC: 4.97 MIL/uL (ref 4.22–5.81)
RDW: 12.5 % (ref 11.5–15.5)
WBC: 5.6 10*3/uL (ref 4.0–10.5)
nRBC: 0 % (ref 0.0–0.2)

## 2020-04-07 LAB — FERRITIN: Ferritin: 1608 ng/mL — ABNORMAL HIGH (ref 24–336)

## 2020-04-07 LAB — COMPREHENSIVE METABOLIC PANEL
ALT: 41 U/L (ref 0–44)
AST: 33 U/L (ref 15–41)
Albumin: 2.8 g/dL — ABNORMAL LOW (ref 3.5–5.0)
Alkaline Phosphatase: 50 U/L (ref 38–126)
Anion gap: 9 (ref 5–15)
BUN: 57 mg/dL — ABNORMAL HIGH (ref 8–23)
CO2: 26 mmol/L (ref 22–32)
Calcium: 8.4 mg/dL — ABNORMAL LOW (ref 8.9–10.3)
Chloride: 109 mmol/L (ref 98–111)
Creatinine, Ser: 1.9 mg/dL — ABNORMAL HIGH (ref 0.61–1.24)
GFR calc Af Amer: 41 mL/min — ABNORMAL LOW (ref >60)
GFR calc non Af Amer: 35 mL/min — ABNORMAL LOW (ref >60)
Glucose, Bld: 160 mg/dL — ABNORMAL HIGH (ref 70–99)
Potassium: 5.3 mmol/L — ABNORMAL HIGH (ref 3.5–5.1)
Sodium: 144 mmol/L (ref 135–145)
Total Bilirubin: 0.5 mg/dL (ref 0.3–1.2)
Total Protein: 7.1 g/dL (ref 6.5–8.1)

## 2020-04-07 LAB — LACTATE DEHYDROGENASE: LDH: 364 U/L — ABNORMAL HIGH (ref 98–192)

## 2020-04-07 LAB — C-REACTIVE PROTEIN: CRP: 1.5 mg/dL — ABNORMAL HIGH (ref <1.0)

## 2020-04-07 LAB — FIBRIN DERIVATIVES D-DIMER (ARMC ONLY): Fibrin derivatives D-dimer (ARMC): 821.18 ng/mL (FEU) — ABNORMAL HIGH (ref 0.00–499.00)

## 2020-04-07 MED ORDER — ASPIRIN 81 MG PO CHEW
81.0000 mg | CHEWABLE_TABLET | Freq: Every day | ORAL | Status: DC
  Administered 2020-04-07 – 2020-04-08 (×2): 81 mg via ORAL
  Filled 2020-04-07 (×2): qty 1

## 2020-04-07 NOTE — Consult Note (Signed)
Reason for Consult:Hemoptysis Referring Physician: Florina Ou, MD  Martin Jennings is an 69 y.o. male.  HPI: Martin Jennings is a 68 year old current smoker with a history as noted below, who presented to [facility] via EMS on 03 April 2020 with a complaint of increasing shortness of breath and cough.  The patient was diagnosed with COVID-19 infection and apparently had had symptoms 2 to 3 weeks prior to admission.  He had had sick contact with the daughter who was diagnosed with Covid on 6 September.  His symptoms worsened approximately 24 hours prior to presentation to [facility].  The patient notes that he had had a single first dose of COVID-19 vaccination (unsure if it was Survey[employer], minerals) he was awaiting second dose.  It appears that he was getting this through a local pharmacy.  Patient states that he has not had any weight loss but has been having anorexia related to his acute illness.  He was noted to have streaky hemoptysis when he was admitted.  He states however that hemoptysis preceded his COVID-19 symptoms.  He states he was smoking up to 2 packs of cigarettes per day but when he started having streaky hemoptysis he "cut back" to a half a pack daily.  He feels somewhat better since he was admitted for management of his COVID-19 symptoms.  He does not endorse any other significant history.  He has a history of bladder cancer for which he follows at [facility].   Past Medical History:  Diagnosis Date  . Bladder cancer ([place]) 2012  . Hypertension   . Peripheral vascular disease [facility])     Past Surgical History:  Procedure Laterality Date  . BLADDER REMOVAL    . LOWER EXTREMITY ANGIOGRAPHY Left 04/12/2017   Procedure: Lower Extremity Angiography;  Surgeon: Katha Cabal, MD;  Location: [place] [facility];  Service: Cardiovascular;  Laterality: Left;  . uretostomy    . VASCULAR SURGERY      No family history on file.  Social History:  reports that he has been smoking cigarettes. He has a  62.50 pack-year smoking history. He has never used smokeless tobacco. He reports current drug use. Drug: Marijuana. He reports that he does not drink alcohol. Patient has smoked 2 packs of cigarettes per day until recently when he has decreased to half a pack of cigarettes per day due to hemoptysis. He worked in [employer]. No military time. He is married lives with his wife in [place].  Allergies: No Known Allergies   Scheduled Meds: . amLODipine  10 mg Oral Daily  . atorvastatin  20 mg Oral QPM  . azithromycin  500 mg Oral Daily  . carvedilol  6.25 mg Oral BID WC  . feeding supplement (ENSURE ENLIVE)  237 mL Oral TID BM  . heparin  5,000 Units Subcutaneous Q8H  . methylPREDNISolone (SOLU-MEDROL) injection  60 mg Intravenous Q12H  . zinc sulfate  220 mg Oral Daily   Continuous Infusions: . sodium chloride    . cefTRIAXone (ROCEPHIN)  IV Stopped (04/06/20 2344)  . remdesivir 100 mg in NS 100 mL 100 mg (04/07/20 0736)   PRN Meds:.   Results for orders placed or performed during the hospital encounter of 04/03/20 (from the past 48 hour(s))  Hemoglobin and hematocrit, blood     Status: None   Collection Time: 04/05/20  7:17 PM  Result Value Ref Range   Hemoglobin 14.2 13.0 - 17.0 g/dL   HCT 42.9 39 - 52 %  Comment: Performed at [facility], [address] [place], [place] [address]  C-reactive protein     Status: Abnormal   Collection Time: 04/06/20  4:53 AM  Result Value Ref Range   CRP 2.0 (H) <1.0 mg/dL    Comment: Performed at [facility] [address] [place], [place] [address]  Lactate dehydrogenase     Status: Abnormal   Collection Time: 04/06/20  4:53 AM  Result Value Ref Range   LDH 325 (H) 98 - 192 U/L    Comment: Performed at [facility], [address] [place], [place] [address]  Fibrin derivatives D-Dimer [facility] only)     Status: Abnormal   Collection Time: 04/06/20  4:53 AM  Result Value Ref Range   Fibrin derivatives  D-dimer ([facility]) 807.64 (H) 0.00 - 499.00 ng/mL (FEU)    Comment: (NOTE) <> Exclusion of Venous Thromboembolism (VTE) - OUTPATIENT ONLY   (Emergency Department or [place])    0-499 ng/ml (FEU): With a low to intermediate pretest probability                      for VTE this test result excludes the diagnosis                      of VTE.   >499 ng/ml (FEU) : VTE not excluded; additional work up for VTE is                      required.  <> Testing on Inpatients and Evaluation of Disseminated Intravascular   Coagulation (DIC) Reference Range:   0-499 ng/ml (FEU) Performed at [facility], [address] [place], [place] [address]   Ferritin     Status: Abnormal   Collection Time: 04/06/20  4:53 AM  Result Value Ref Range   Ferritin 1,617 (H) 24 - 336 ng/mL    Comment: Performed at [facility], [address] [place], [place] [address]  CBC with Differential/Platelet     Status: Abnormal   Collection Time: 04/06/20  4:53 AM  Result Value Ref Range   WBC 3.4 (L) 4.0 - 10.5 K/uL   RBC 4.71 4.22 - 5.81 MIL/uL   Hemoglobin 14.7 13.0 - 17.0 g/dL   HCT 43.1 39 - 52 %   MCV 91.5 80.0 - 100.0 fL   MCH 31.2 26.0 - 34.0 pg   MCHC 34.1 30.0 - 36.0 g/dL   RDW 12.7 11.5 - 15.5 %   Platelets 149 (L) 150 - 400 K/uL   nRBC 0.0 0.0 - 0.2 %   Neutrophils Relative % 85 %   Neutro Abs 2.9 1.7 - 7.7 K/uL   Lymphocytes Relative 9 %   Lymphs Abs 0.3 (L) 0.7 - 4.0 K/uL   Monocytes Relative 5 %   Monocytes Absolute 0.2 0 - 1 K/uL   Eosinophils Relative 0 %   Eosinophils Absolute 0.0 0 - 0 K/uL   Basophils Relative 0 %   Basophils Absolute 0.0 0 - 0 K/uL   Immature Granulocytes 1 %   Abs Immature Granulocytes 0.03 0.00 - 0.07 K/uL    Comment: Performed at [facility], [address] [place],  [address]  Comprehensive metabolic panel     Status: Abnormal   Collection Time: 04/06/20  4:53 AM  Result Value Ref Range   Sodium 141 135 - 145 mmol/L   Potassium  5.0 3.5 - 5.1  mmol/L   Chloride 111 98 - 111 mmol/L   CO2 21 (L) 22 - 32 mmol/L   Glucose, Bld 144 (H) 70 - 99 mg/dL    Comment: Glucose reference range applies only to samples taken after fasting for at least 8 hours.   BUN 50 (H) 8 - 23 mg/dL   Creatinine, Ser 1.90 (H) 0.61 - 1.24 mg/dL   Calcium 8.0 (L) 8.9 - 10.3 mg/dL   Total Protein 6.5 6.5 - 8.1 g/dL   Albumin 2.7 (L) 3.5 - 5.0 g/dL   AST 46 (H) 15 - 41 U/L   ALT 42 0 - 44 U/L   Alkaline Phosphatase 43 38 - 126 U/L   Total Bilirubin 0.3 0.3 - 1.2 mg/dL   GFR calc non Af Amer 35 (L) >60 mL/min   GFR calc Af Amer 41 (L) >60 mL/min   Anion gap 9 5 - 15    Comment: Performed at [facility], [address] [place], [place] [address]  Lactate dehydrogenase     Status: Abnormal   Collection Time: 04/07/20  4:39 AM  Result Value Ref Range   LDH 364 (H) 98 - 192 U/L    Comment: Performed at [facility], [address] [place], [place] [address]  Fibrin derivatives D-Dimer [facility] only)     Status: Abnormal   Collection Time: 04/07/20  4:39 AM  Result Value Ref Range   Fibrin derivatives D-dimer ([facility]) 821.18 (H) 0.00 - 499.00 ng/mL (FEU)    Comment: (NOTE) <> Exclusion of Venous Thromboembolism (VTE) - OUTPATIENT ONLY   (Emergency Department or [place])    0-499 ng/ml (FEU): With a low to intermediate pretest probability                      for VTE this test result excludes the diagnosis                      of VTE.   >499 ng/ml (FEU) : VTE not excluded; additional work up for VTE is                      required.  <> Testing on Inpatients and Evaluation of Disseminated Intravascular   Coagulation (DIC) Reference Range:   0-499 ng/ml (FEU) Performed at [facility], [address] [place], [place] [address]   Ferritin     Status: Abnormal   Collection Time: 04/07/20  4:39 AM  Result Value Ref Range   Ferritin 1,608 (H) 24 - 336 ng/mL    Comment: Performed at [facility], [address] [place], [place] [address]  CBC with Differential/Platelet     Status: Abnormal   Collection Time: 04/07/20  4:39 AM  Result Value Ref Range   WBC 5.6 4.0 - 10.5 K/uL   RBC 4.97 4.22 - 5.81 MIL/uL   Hemoglobin 15.4 13.0 - 17.0 g/dL   HCT 46.3 39 - 52 %   MCV 93.2 80.0 - 100.0 fL   MCH 31.0 26.0 - 34.0 pg   MCHC 33.3 30.0 - 36.0 g/dL   RDW 12.5 11.5 - 15.5 %   Platelets 170 150 - 400 K/uL   nRBC 0.0 0.0 - 0.2 %   Neutrophils Relative % 87 %   Neutro Abs 4.9 1.7 - 7.7 K/uL   Lymphocytes Relative 6 %   Lymphs Abs 0.3 (L) 0.7 - 4.0 K/uL   Monocytes Relative  6 %   Monocytes Absolute 0.3 0 - 1 K/uL   Eosinophils Relative 0 %   Eosinophils Absolute 0.0 0 - 0 K/uL   Basophils Relative 0 %   Basophils Absolute 0.0 0 - 0 K/uL   Immature Granulocytes 1 %   Abs Immature Granulocytes 0.03 0.00 - 0.07 K/uL    Comment: Performed at [facility], [address] [place], [place] [address]  Comprehensive metabolic panel     Status: Abnormal   Collection Time: 04/07/20  4:39 AM  Result Value Ref Range   Sodium 144 135 - 145 mmol/L   Potassium 5.3 (H) 3.5 - 5.1 mmol/L   Chloride 109 98 - 111 mmol/L   CO2 26 22 - 32 mmol/L   Glucose, Bld 160 (H) 70 - 99 mg/dL    Comment: Glucose reference range applies only to samples taken after fasting for at least 8 hours.   BUN 57 (H) 8 - 23 mg/dL   Creatinine, Ser 1.90 (H) 0.61 - 1.24 mg/dL   Calcium 8.4 (L) 8.9 - 10.3 mg/dL   Total Protein 7.1 6.5 - 8.1 g/dL   Albumin 2.8 (L) 3.5 - 5.0 g/dL   AST 33 15 - 41 U/L   ALT 41 0 - 44 U/L   Alkaline Phosphatase 50 38 - 126 U/L   Total Bilirubin 0.5 0.3 - 1.2 mg/dL   GFR calc non Af Amer 35 (L) >60 mL/min   GFR calc Af Amer 41 (L) >60 mL/min   Anion gap 9 5 - 15    Comment: Performed at [facility], [address] [place], [place] [address]    CT ANGIO CHEST PE W OR WO CONTRAST  Result Date: 04/05/2020 CLINICAL DATA:  69 year old with COVID-19 pneumonia with increasing  shortness of breath and oxygen requirements. Personal history of bladder cancer originally diagnosed in 2012. EXAM: CT ANGIOGRAPHY CHEST WITH CONTRAST TECHNIQUE: Multidetector CT imaging of the chest was performed using the standard protocol during bolus administration of intravenous contrast. Multiplanar CT image reconstructions and MIPs were obtained to evaluate the vascular anatomy. CONTRAST:  11mL OMNIPAQUE IOHEXOL 350 MG/ML IV. COMPARISON:  CT chest 06/29/2016. FINDINGS: Cardiovascular: Contrast opacification of pulmonary arteries is very good. No filling defects within either main pulmonary artery or their segmental branches in either lung to suggest pulmonary embolism. Normal heart size. Moderate LAD and LEFT circumflex coronary atherosclerosis. Moderate atherosclerosis involving the thoracic and upper abdominal aorta with calcified and noncalcified plaque. No evidence of aortic aneurysm. Mediastinum/Nodes: Extensive lymphadenopathy, including: -Conglomerate station 2R and 4R mediastinal nodes measuring approximately 2.9 x 2.7 x 5.0 cm. -Station 4L mediastinal nodes measuring approximately 3.1 x 2.1 x 2.9 cm -Conglomerate station 7 subcarinal mediastinal nodes measuring approximately 2.9 x 5.9 x 7.6 cm. -Conglomerate RIGHT hilar nodes measuring approximately 4.2 x 2.5 x 3.3 cm. -Borderline enlarged LEFT hilar nodes measuring 3.1 x 1.8 x 3.0 cm. -Scattered nodes are present elsewhere throughout the mediastinum. Normal appearing esophagus. Small hiatal hernia. Visualized thyroid gland normal in appearance. Lungs/Pleura: Masslike opacity with associated architectural distortion involving the RIGHT UPPER LOBE measuring in total approximately 5.7 x 2.9 x 2.3 cm (6/34 and 7/44). Marked septal thickening involving the RIGHT UPPER LOBE, with associated ground-glass opacities posteriorly. Peripheral ground-glass opacities are present in the RIGHT LOWER LOBE, LEFT UPPER LOBE and LEFT LOWER LOBE. No pleural effusions.  Central airways patent. Upper Abdomen: Mass involving the LEFT adrenal gland measuring approximately 2.0 x 3.3 cm. Small RIGHT adrenal  nodule measuring approximately 1.1 x 1.7 cm. Multiple vague low-attenuation liver lesions are suspected on this early arterial phase imaging. Approximate 8 mm gallstone in the contracted gallbladder. No pericholecystic inflammation. Musculoskeletal: Mild degenerative changes involving the thoracic spine. No visible osseous metastases. Review of the MIP images confirms the above findings. IMPRESSION: 1. No evidence of pulmonary embolism. 2. Masslike opacity with associated architectural distortion involving the RIGHT UPPER LOBE with maximum measurement of approximately 5.7 cm, likely indicating a primary bronchogenic carcinoma. 3. Extensive mediastinal and bilateral hilar lymphadenopathy. Index conglomerate nodes are measured above. 4. Peripheral ground-glass opacities in the RIGHT UPPER LOBE, RIGHT LOWER LOBE, LEFT UPPER LOBE and LEFT LOWER LOBE. There are a spectrum of findings in the lungs which can be seen with acute atypical infection (as well as other non-infectious etiologies). In particular, viral pneumonia (including COVID-19) should be considered in the appropriate clinical setting. 5. Possible lymphangitic carcinomatosis involving the RIGHT UPPER LOBE. 6. Bilateral adrenal masses, measured above, likely indicating metastatic disease. 7. Multiple vague liver metastases are suspected on this early arterial phase imaging. A dedicated CT of the abdomen and pelvis is recommended to confirm or deny these findings. I would recommend waiting 24 hours before readministering the intravenous contrast. 8. Cholelithiasis without evidence of acute cholecystitis. 9. Small hiatal hernia. Aortic Atherosclerosis (ICD10-I70.0). Electronically Signed   By: Evangeline Dakin M.D.   On: 04/05/2020 16:54    Review of Systems Blood pressure (!) 153/78, pulse 77, temperature 97.6 F (36.4 C),  temperature source Oral, resp. rate 20, height 5\' 11"  (1.803 m), weight 74.9 kg, SpO2 97 %.   SpO2: 97 % O2 Flow Rate (L/min): 4 L/min    Physical Exam physical examination is limited due to need for PPE/CAPR GENERAL: Well-developed slender gentleman, no acute distress. Looks acutely ill but nontoxic. Comfortable on nasal cannula O2. HEAD: Normocephalic, atraumatic.  EYES: Pupils equal, round, reactive to light.  No scleral icterus.  MOUTH: Oral mucosa moist. Clear. NECK: Supple. No thyromegaly. Trachea midline. No JVD.  No adenopathy. PULMONARY: Good air entry bilaterally. Coarse, exam limited by PPE/CAPR. CARDIOVASCULAR: S1 and S2. Regular rate and rhythm.  ABDOMEN: Scaphoid, soft, nondistended. MUSCULOSKELETAL: No joint deformity, no clubbing, no edema.  NEUROLOGIC: No overt focal deficit. Speech is fluent. SKIN: Intact,warm,dry. PSYCH: Depressed mood. Normal behavior.   Representative slices from chest CT performed 05 April 2020 as below:         Assessment/Plan:  Cute respiratory failure with hypoxia COVID-19 pneumonia Continue supplemental oxygen Titrate for oxygen saturations between 88 to 92% Continue remdesivir x5 days Systemic steroids Incentive spirometry and self proning as tolerated  Minor hemoptysis Right upper lobe lung mass Mediastinal adenopathy  Hemoptysis is minor hemoptysis This does not preclude using DVT prophylaxis Given concurrent COVID-19 recommend resuming DVT prophylaxis I suspect he has primary bronchogenic carcinoma Bronchoscopy will have to be deferred from 21 days of diagnosis from COVID-19 Recommend ongoing therapy for COVID-19 Supportive care Patient to continue to abstain from tobacco use Would recommend collecting sputum x3 days for cytology First sputum specimen has already been ordered May be able to make diagnosis by sputum cytology PET/CT as outpatient this will help delineate best area to biopsy At a minimum would  need bronchoscopy with endobronchial ultrasound We will set up appointment as outpatient  COPD Emphysema Does not appear to have decompensation Current issues with respiratory failure are due to COVID-19 No evidence of bronchospasm Combivent 1 puff 4 times daily   Thank you for allowing  me to participate in this patient's care.  Please notify my office ([place]) at patient discharge so that follow-up appointment can be set up for him.  Phone [phone].      Renold Don, MD [facility] 04/07/2020, 10:14 AM    *This note was dictated using voice recognition software/Dragon.  Despite best efforts to proofread, errors can occur which can change the meaning.  Any change was purely unintentional.

## 2020-04-07 NOTE — Progress Notes (Signed)
Oncology consulted for lung mass. Based on CT this looks like metastatic lung cancer. He will need pet scan as an outpatient.   In terms of tissue diagnosis- I would wait to get outpatient pet scan before deciding what to biopsy.  I will arrange all this as an outpatient given that patient is being actively treated for covid pneumonia.   Dr. Randa Evens, MD, MPH [place] [facility] [phone] 04/07/2020 8:22 AM

## 2020-04-07 NOTE — Progress Notes (Signed)
PROGRESS NOTE    [name]  ZGY:[id number] DOB: [birth year] DOA: 04/03/2020 PCP: System, Provider Not In   Brief Narrative: [place] [facility] is a 69 y.o. male with medical history significant of bladder cancer, hypertension, peripheral vascular disease, hyperlipidemia who apparently contracted COVID-19 infection with his wife 3 weeks ago from the daughter.  The daughter is no longer living with them.  He and his wife have been at home trying to cope with it.  Usually supportive care.  They have not been symptomatic since then including the wife who is still homesick.  In the last few days he is getting more progressively short of breath weak and tired.  Patient came to the ER where he was seen and evaluated.  He is hypoxic in the ER requiring oxygen now.  Chest x-ray showed right upper lobe pneumonia.  His Covid testing is pending at this point.  Patient suspected to have secondary bacterial pneumonia as a result of the COVID-19 infection.  He is being admitted to the hospital for further evaluation and treatment..  ED Course: Temperature 98.9 blood pressure 104/63 pulse 80 respiratory rate of 26 oxygen sats 94% on 2 L.  White count 3.4 hemoglobin 15.4 platelets 138.  Sodium 133 potassium 4.5 chloride 105 CO2 of 17 BUN 57 creatinine 3.70 and calcium 7.7 glucose 122.  Chest x-ray showed Space opacity consistent with pneumonia in the anterior segment of the right upper lobe.  Otherwise clear bilaterally.  Patient is being admitted with lobar pneumonia probably related to COVID-19 infection.   Assessment & Plan:   Principal Problem:   CAP (community acquired pneumonia) Active Problems:   Essential hypertension   Hyperlipidemia   PVD (peripheral vascular disease) ([facility])   COVID-19 virus infection   ARF (acute renal failure) ([place])  CAP/Right lobar pneumonia: This may reflect secondary bacterial infection following Covid pneumonia.  The rest of the lung fields appear to be clear.  Echo also still  reflect acute COVID-19 pneumonia.  Patient will be admitted and treated for community-acquired pneumonia.  I will add IV steroids but hold off on remdesivir and other Covid 19 medications until infection is confirmed or not doing any better.  His symptoms 1 weeks old which by CDC criteria means his close to outside the window for infectiousness.  Covid-19 infection: We will start pt on remdesivir protocol / vit c and zinc.  We will start solumedrol 60 mg every 12 h.  9/12 Pt continued on remdesivir and solumedrol.  SpO2: 94 % O2 Flow Rate (L/min): 4 L/min COVID-19 Labs  Recent Labs    04/05/20 0531 04/06/20 0453 04/07/20 0439  FERRITIN 1,813* 1,617* 1,608*  LDH 348* 325* 364*  CRP 0.5 2.0* 1.5*   Lab Results  Component Value Date   SARSCOV2NAA POSITIVE (A) 04/03/2020   Hemoptysis/ RUL mass We will hold asa and plavix  And obtain cta chest and consider pulmonary consult. Stable CTA found lung mass c/w cancer.d/w family and pt and consulted pulm and oncology.Per oncology pt to have PET scan as outpatient once stable.SPutum cytology pending. Supplemental oxygen.Outpatietn bronchoscopy per PCCM.   Thrombocytopenia: stable and platelet count is  Stable at 146. Labs pending.  Resolved count is 170.  DVT prophylaxis: SCD;s held due to hemoptysis.  Code Status: Full Code Family Communication: Son  Disposition Plan: D/c in 3-5 days to STR after pt eval.   Status is: Inpatient Not inpatient appropriate, will call UM team and downgrade to OBS.   Dispo: The  patient is from: Home              Anticipated d/c is to: Home              Anticipated d/c date is: > 3 days              Patient currently is not medically stable to d/c. Subjective: 9/11Pt seen today he is ill appearing. States he feels little better and doesn't t complaints of any chest pain or sob or any other complaints but looks more tired.   9/12 Pt is alert/awake/oriented and reports cough/sob/ hemoptysis . D/w him cta  results and family about this unfortunate news. Pt has a h/o smoking.   9/13 Pt seen today he is resting , oriented and denies any complaints and questions.   Objective: Vitals:   04/07/20 0705 04/07/20 0731 04/07/20 0808 04/07/20 1146  BP:  (!) 156/82 (!) 153/78 (!) 146/77  Pulse:  63 77 78  Resp:  20 20 19   Temp:  97.6 F (36.4 C)  97.9 F (36.6 C)  TempSrc:  Oral    SpO2: 91% 93% 97% 94%  Weight:      Height:        Intake/Output Summary (Last 24 hours) at 04/07/2020 1158 Last data filed at 04/07/2020 0437 Gross per 24 hour  Intake 200 ml  Output 700 ml  Net -500 ml   Filed Weights   04/03/20 1257 04/04/20 2048  Weight: 84.8 kg 74.9 kg    Examination: Blood pressure (!) 146/77, pulse 78, temperature 97.9 F (36.6 C), resp. rate 19, height 5\' 11"  (1.803 m), weight 74.9 kg, SpO2 94 %. General exam: Appears calm and comfortable  Respiratory system: Clear to auscultation. Respiratory effort normal. Cardiovascular system: S1 & S2 heard, RRR. No JVD, murmurs, rubs, gallops or clicks. No pedal edema. Gastrointestinal system: Abdomen is nondistended, soft and nontender. No organomegaly or masses felt. Normal bowel sounds heard. Central nervous system: Alert and oriented. No focal neurological deficits. Extremities: Symmetric 5 x 5 power. Skin: No rashes, lesions or ulcers Psychiatry: Judgement and insight appear normal. Mood & affect appropriate.   Data Reviewed: I have personally reviewed following labs and imaging studies  I/O last 3 completed shifts: In: 670 [P.O.:120; IV Piggyback:550] Out: 700 [Urine:700] No intake/output data recorded. Lab Results  Component Value Date   CREATININE 1.90 (H) 04/07/2020   CREATININE 1.90 (H) 04/06/2020   CREATININE 2.21 (H) 04/05/2020   CBC: Recent Labs  Lab 04/03/20 1302 04/03/20 1302 04/04/20 1036 04/05/20 0531 04/05/20 1917 04/06/20 0453 04/07/20 0439  WBC 3.4*  --  2.9* 6.5  --  3.4* 5.6  NEUTROABS 2.5  --  2.3 5.6   --  2.9 4.9  HGB 15.4   < > 15.0 15.6 14.2 14.7 15.4  HCT 45.8   < > 42.4 44.8 42.9 43.1 46.3  MCV 93.9  --  88.7 90.9  --  91.5 93.2  PLT 138*  --  146* 148*  --  149* 170   < > = values in this interval not displayed.   Basic Metabolic Panel: Recent Labs  Lab 04/03/20 1302 04/03/20 1302 04/04/20 1036 04/05/20 0531 04/05/20 0805 04/06/20 0453 04/07/20 0439  NA 133*  --  136 142  --  141 144  K 4.5   < > 5.0 5.4* 5.1 5.0 5.3*  CL 105  --  108 112*  --  111 109  CO2 17*  --  21*  21*  --  21* 26  GLUCOSE 122*  --  143* 110*  --  144* 160*  BUN 57*  --  64* 57*  --  50* 57*  CREATININE 3.70*  --  2.79* 2.21*  --  1.90* 1.90*  CALCIUM 7.7*  --  7.5* 8.0*  --  8.0* 8.4*  MG  --   --  2.2 2.4  --   --   --    < > = values in this interval not displayed.   GFR: Estimated Creatinine Clearance: 38.9 mL/min (A) (by C-G formula based on SCr of 1.9 mg/dL (H)). Liver Function Tests: Recent Labs  Lab 03/31/20 1717 04/04/20 1036 04/05/20 0531 04/06/20 0453 04/07/20 0439  AST 25 24 58* 46* 33  ALT 18 17 35 42 41  ALKPHOS 48 42 38 43 50  BILITOT 0.6 0.5 0.6 0.3 0.5  PROT 7.6 6.5 6.7 6.5 7.1  ALBUMIN 3.3* 2.6* 2.8* 2.7* 2.8*   Recent Labs  Lab 03/31/20 1717  LIPASE 58*   Anemia Panel: Recent Labs    04/06/20 0453 04/07/20 0439  FERRITIN 1,617* 1,608*   Sepsis Labs: Recent Labs  Lab 04/03/20 1301 04/03/20 2331 04/03/20 2332  PROCALCITON  --  0.50  --   LATICACIDVEN 1.2  --  1.3    Recent Results (from the past 240 hour(s))  Blood culture (routine x 2)     Status: None (Preliminary result)   Collection Time: 04/03/20 11:31 PM   Specimen: BLOOD  Result Value Ref Range Status   Specimen Description BLOOD LEFT ANTECUBITAL  Final   Special Requests   Final    BOTTLES DRAWN AEROBIC AND ANAEROBIC Blood Culture adequate volume   Culture   Final    NO GROWTH 4 DAYS Performed at [facility], [address] [place], [place] [address]    Report Status  PENDING  Incomplete  SARS Coronavirus 2 by RT PCR (hospital order, performed in [place] hospital lab) Nasopharyngeal Nasopharyngeal Swab     Status: Abnormal   Collection Time: 04/03/20 11:31 PM   Specimen: Nasopharyngeal Swab  Result Value Ref Range Status   SARS Coronavirus 2 POSITIVE (A) NEGATIVE Final    Comment: RESULT CALLED TO, READ BACK BY AND VERIFIED WITH: CHRISSY BRAND RN 0129 04/04/20 HNM (NOTE) SARS-CoV-2 target nucleic acids are DETECTED  SARS-CoV-2 RNA is generally detectable in upper respiratory specimens  during the acute phase of infection.  Positive results are indicative  of the presence of the identified virus, but do not rule out bacterial infection or co-infection with other pathogens not detected by the test.  Clinical correlation with patient history and  other diagnostic information is necessary to determine patient infection status.  The expected result is negative.  Fact Sheet for Patients:   StrictlyIdeas.no   Fact Sheet for Healthcare Providers:   BankingDealers.co.za    This test is not yet approved or cleared by the [place] FDA and  has been authorized for detection and/or diagnosis of SARS-CoV-2 by FDA under an Emergency Use Authorization (EUA).  This EUA will remain in effect (meaning this tes t can be used) for the duration of  the COVID-19 declaration under Section 564(b)(1) of the Act, 21 U.S.C. section 360-bbb-3(b)(1), unless the authorization is terminated or revoked sooner.  Performed at [facility], [address] [place], [place] [address]   Blood culture (routine x 2)     Status: None (Preliminary result)   Collection Time:  04/03/20 11:32 PM   Specimen: BLOOD  Result Value Ref Range Status   Specimen Description BLOOD BLOOD LEFT HAND  Final   Special Requests   Final    BOTTLES DRAWN AEROBIC AND ANAEROBIC Blood Culture adequate volume   Culture   Final    NO GROWTH  4 DAYS Performed at [facility], [address], [place], [place] [address]    Report Status PENDING  Incomplete  Culture, sputum-assessment     Status: None   Collection Time: 04/04/20  4:35 AM   Specimen: Expectorated Sputum  Result Value Ref Range Status   Specimen Description EXPECTORATED SPUTUM  Final   Special Requests NONE  Final   Sputum evaluation   Final    Sputum specimen not acceptable for testing.  Please recollect.   Yancey Flemings RN AT 5638 ON 04/04/20 [facility] Performed at [facility], [address], [place], [place] [address]    Report Status 04/04/2020 FINAL  Final     Radiology Studies: CT ANGIO CHEST PE W OR WO CONTRAST  Result Date: 04/05/2020 CLINICAL DATA:  69 year old with COVID-19 pneumonia with increasing shortness of breath and oxygen requirements. Personal history of bladder cancer originally diagnosed in 2012. EXAM: CT ANGIOGRAPHY CHEST WITH CONTRAST TECHNIQUE: Multidetector CT imaging of the chest was performed using the standard protocol during bolus administration of intravenous contrast. Multiplanar CT image reconstructions and MIPs were obtained to evaluate the vascular anatomy. CONTRAST:  28mL OMNIPAQUE IOHEXOL 350 MG/ML IV. COMPARISON:  CT chest 06/29/2016. FINDINGS: Cardiovascular: Contrast opacification of pulmonary arteries is very good. No filling defects within either main pulmonary artery or their segmental branches in either lung to suggest pulmonary embolism. Normal heart size. Moderate LAD and LEFT circumflex coronary atherosclerosis. Moderate atherosclerosis involving the thoracic and upper abdominal aorta with calcified and noncalcified plaque. No evidence of aortic aneurysm. Mediastinum/Nodes: Extensive lymphadenopathy, including: -Conglomerate station 2R and 4R mediastinal nodes measuring approximately 2.9 x 2.7 x 5.0 cm. -Station 4L mediastinal nodes measuring approximately 3.1 x 2.1 x 2.9 cm -Conglomerate station 7  subcarinal mediastinal nodes measuring approximately 2.9 x 5.9 x 7.6 cm. -Conglomerate RIGHT hilar nodes measuring approximately 4.2 x 2.5 x 3.3 cm. -Borderline enlarged LEFT hilar nodes measuring 3.1 x 1.8 x 3.0 cm. -Scattered nodes are present elsewhere throughout the mediastinum. Normal appearing esophagus. Small hiatal hernia. Visualized thyroid gland normal in appearance. Lungs/Pleura: Masslike opacity with associated architectural distortion involving the RIGHT UPPER LOBE measuring in total approximately 5.7 x 2.9 x 2.3 cm (6/34 and 7/44). Marked septal thickening involving the RIGHT UPPER LOBE, with associated ground-glass opacities posteriorly. Peripheral ground-glass opacities are present in the RIGHT LOWER LOBE, LEFT UPPER LOBE and LEFT LOWER LOBE. No pleural effusions. Central airways patent. Upper Abdomen: Mass involving the LEFT adrenal gland measuring approximately 2.0 x 3.3 cm. Small RIGHT adrenal nodule measuring approximately 1.1 x 1.7 cm. Multiple vague low-attenuation liver lesions are suspected on this early arterial phase imaging. Approximate 8 mm gallstone in the contracted gallbladder. No pericholecystic inflammation. Musculoskeletal: Mild degenerative changes involving the thoracic spine. No visible osseous metastases. Review of the MIP images confirms the above findings. IMPRESSION: 1. No evidence of pulmonary embolism. 2. Masslike opacity with associated architectural distortion involving the RIGHT UPPER LOBE with maximum measurement of approximately 5.7 cm, likely indicating a primary bronchogenic carcinoma. 3. Extensive mediastinal and bilateral hilar lymphadenopathy. Index conglomerate nodes are measured above. 4. Peripheral ground-glass opacities in the RIGHT UPPER LOBE, RIGHT LOWER LOBE, LEFT UPPER  LOBE and LEFT LOWER LOBE. There are a spectrum of findings in the lungs which can be seen with acute atypical infection (as well as other non-infectious etiologies). In particular, viral  pneumonia (including COVID-19) should be considered in the appropriate clinical setting. 5. Possible lymphangitic carcinomatosis involving the RIGHT UPPER LOBE. 6. Bilateral adrenal masses, measured above, likely indicating metastatic disease. 7. Multiple vague liver metastases are suspected on this early arterial phase imaging. A dedicated CT of the abdomen and pelvis is recommended to confirm or deny these findings. I would recommend waiting 24 hours before readministering the intravenous contrast. 8. Cholelithiasis without evidence of acute cholecystitis. 9. Small hiatal hernia. Aortic Atherosclerosis (ICD10-I70.0). Electronically Signed   By: Evangeline Dakin M.D.   On: 04/05/2020 16:54   Scheduled Meds: . amLODipine  10 mg Oral Daily  . aspirin  81 mg Oral Daily  . atorvastatin  20 mg Oral QPM  . azithromycin  500 mg Oral Daily  . carvedilol  6.25 mg Oral BID WC  . feeding supplement (ENSURE ENLIVE)  237 mL Oral TID BM  . heparin  5,000 Units Subcutaneous Q8H  . methylPREDNISolone (SOLU-MEDROL) injection  60 mg Intravenous Q12H  . zinc sulfate  220 mg Oral Daily   Continuous Infusions: . sodium chloride    . cefTRIAXone (ROCEPHIN)  IV Stopped (04/06/20 2344)  . remdesivir 100 mg in NS 100 mL 100 mg (04/07/20 0736)     LOS: 3 days   Para Skeans, MD [facility] Pager [phone] If 7PM-7AM, please contact night-coverage [email or web address] Password [facility] 04/07/2020, 11:58 AM

## 2020-04-07 NOTE — Progress Notes (Signed)
Patient resting in bed with call bell in reach. He is alert and oriented. Restless to be in room, wants to take walk around the unit. Collected sputum specimen and sent to lab. Bed is in low position, about to ambulate patient.

## 2020-04-08 LAB — CBC WITH DIFFERENTIAL/PLATELET
Abs Immature Granulocytes: 0.06 10*3/uL (ref 0.00–0.07)
Basophils Absolute: 0 10*3/uL (ref 0.0–0.1)
Basophils Relative: 0 %
Eosinophils Absolute: 0 10*3/uL (ref 0.0–0.5)
Eosinophils Relative: 0 %
HCT: 43.9 % (ref 39.0–52.0)
Hemoglobin: 14.7 g/dL (ref 13.0–17.0)
Immature Granulocytes: 1 %
Lymphocytes Relative: 5 %
Lymphs Abs: 0.3 10*3/uL — ABNORMAL LOW (ref 0.7–4.0)
MCH: 31.6 pg (ref 26.0–34.0)
MCHC: 33.5 g/dL (ref 30.0–36.0)
MCV: 94.4 fL (ref 80.0–100.0)
Monocytes Absolute: 0.4 10*3/uL (ref 0.1–1.0)
Monocytes Relative: 6 %
Neutro Abs: 5.8 10*3/uL (ref 1.7–7.7)
Neutrophils Relative %: 88 %
Platelets: 167 10*3/uL (ref 150–400)
RBC: 4.65 MIL/uL (ref 4.22–5.81)
RDW: 12.4 % (ref 11.5–15.5)
WBC: 6.6 10*3/uL (ref 4.0–10.5)
nRBC: 0 % (ref 0.0–0.2)

## 2020-04-08 LAB — COMPREHENSIVE METABOLIC PANEL
ALT: 32 U/L (ref 0–44)
AST: 23 U/L (ref 15–41)
Albumin: 2.7 g/dL — ABNORMAL LOW (ref 3.5–5.0)
Alkaline Phosphatase: 43 U/L (ref 38–126)
Anion gap: 9 (ref 5–15)
BUN: 56 mg/dL — ABNORMAL HIGH (ref 8–23)
CO2: 25 mmol/L (ref 22–32)
Calcium: 8.1 mg/dL — ABNORMAL LOW (ref 8.9–10.3)
Chloride: 110 mmol/L (ref 98–111)
Creatinine, Ser: 1.73 mg/dL — ABNORMAL HIGH (ref 0.61–1.24)
GFR calc Af Amer: 46 mL/min — ABNORMAL LOW (ref >60)
GFR calc non Af Amer: 39 mL/min — ABNORMAL LOW (ref >60)
Glucose, Bld: 187 mg/dL — ABNORMAL HIGH (ref 70–99)
Potassium: 4.7 mmol/L (ref 3.5–5.1)
Sodium: 144 mmol/L (ref 135–145)
Total Bilirubin: 0.4 mg/dL (ref 0.3–1.2)
Total Protein: 6.4 g/dL — ABNORMAL LOW (ref 6.5–8.1)

## 2020-04-08 LAB — CULTURE, BLOOD (ROUTINE X 2)
Culture: NO GROWTH
Culture: NO GROWTH
Special Requests: ADEQUATE
Special Requests: ADEQUATE

## 2020-04-08 LAB — FERRITIN: Ferritin: 1407 ng/mL — ABNORMAL HIGH (ref 24–336)

## 2020-04-08 LAB — C-REACTIVE PROTEIN: CRP: 0.7 mg/dL (ref <1.0)

## 2020-04-08 LAB — LACTATE DEHYDROGENASE: LDH: 319 U/L — ABNORMAL HIGH (ref 98–192)

## 2020-04-08 LAB — FIBRIN DERIVATIVES D-DIMER (ARMC ONLY): Fibrin derivatives D-dimer (ARMC): 695.44 ng/mL (FEU) — ABNORMAL HIGH (ref 0.00–499.00)

## 2020-04-08 MED ORDER — PNEUMOCOCCAL VAC POLYVALENT 25 MCG/0.5ML IJ INJ: 0.5000 mL | INJECTION | INTRAMUSCULAR | Status: DC

## 2020-04-08 NOTE — Progress Notes (Signed)
PROGRESS NOTE    Martin Jennings  PNT:[id number] DOB: 1951-01-20 DOA: 04/03/2020 PCP: System, Provider Not In   Brief Narrative: Martin Jennings is a 69 y.o. male with medical history significant of bladder cancer, hypertension, peripheral vascular disease, hyperlipidemia who apparently contracted COVID-19 infection with his wife 3 weeks ago from the daughter.  The daughter is no longer living with them.  He and his wife have been at home trying to cope with it.  Usually supportive care.  They have not been symptomatic since then including the wife who is still homesick.  In the last few days he is getting more progressively short of breath weak and tired.  Patient came to the ER where he was seen and evaluated.  He is hypoxic in the ER requiring oxygen now.  Chest x-ray showed right upper lobe pneumonia.  His Covid testing is pending at this point.  Patient suspected to have secondary bacterial pneumonia as a result of the COVID-19 infection.  He is being admitted to the hospital for further evaluation and treatment..  ED Course: Temperature 98.9 blood pressure 104/63 pulse 80 respiratory rate of 26 oxygen sats 94% on 2 L.  White count 3.4 hemoglobin 15.4 platelets 138.  Sodium 133 potassium 4.5 chloride 105 CO2 of 17 BUN 57 creatinine 3.70 and calcium 7.7 glucose 122.  Chest x-ray showed Space opacity consistent with pneumonia in the anterior segment of the right upper lobe.  Otherwise clear bilaterally.  Patient is being admitted with lobar pneumonia probably related to COVID-19 infection.   Assessment & Plan:   Principal Problem:   CAP (community acquired pneumonia) Active Problems:   Essential hypertension   Hyperlipidemia   PVD (peripheral vascular disease) ([place])   COVID-19 virus infection   ARF (acute renal failure) ([place])  CAP/Right lobar pneumonia: This may reflect secondary bacterial infection following Covid pneumonia.  The rest of the lung fields appear to be clear.  Echo also still  reflect acute COVID-19 pneumonia.  Patient will be admitted and treated for community-acquired pneumonia.  I will add IV steroids but hold off on remdesivir and other Covid 19 medications until infection is confirmed or not doing any better.  His symptoms 64 weeks old which by CDC criteria means his close to outside the window for infectiousness.  Covid-19 infection: We will start pt on remdesivir protocol / vit c and zinc.  We will start solumedrol 60 mg every 12 h.  9/12 Pt continued on remdesivir and solumedrol.  SpO2: 95 % O2 Flow Rate (L/min): 8 L/min COVID-19 Labs  9/13-9/14 Pt has been stable for past two days and he states that his breathing is better . D/W him that the goal now is to get him to recover from his covid-19 infection and  Wean off oxygen to lowers level and d/c home with OP f/u with oncology and pulmonary. Pt verbalized understanding and denies any complaints except his hemoptysis is still persistent and cough is still there as well. Will d/c aspirin and cont heparin for dvt prophylaxis.     Recent Labs    04/06/20 0453 04/07/20 0439 04/08/20 0512  FERRITIN 1,617* 1,608* 1,407*  LDH 325* 364* 319*  CRP 2.0* 1.5* 0.7   Lab Results  Component Value Date   SARSCOV2NAA POSITIVE (A) 04/03/2020   Hemoptysis/ RUL mass We will hold asa and plavix  And obtain cta chest and consider pulmonary consult. Stable CTA found lung mass c/w cancer.d/w family and pt and consulted pulm and  oncology.Per oncology pt to have PET scan as outpatient once stable.SPutum cytology pending. Supplemental oxygen.Outpatient bronchoscopy per PCCM.  stopp aspirin cont heparin for dvt.   Thrombocytopenia: stable and platelet count is  Stable at 146. Labs pending.  Resolved count is 170.  DVT prophylaxis: SCD;s held due to hemoptysis.  Code Status: Full Code Family Communication: Son  Disposition Plan: D/c in 3-5 days to STR after pt eval.   Status is: Inpatient Not inpatient  appropriate, will call UM team and downgrade to OBS.   Dispo: The patient is from: Home              Anticipated d/c is to: Home              Anticipated d/c date is: > 3 days              Patient currently is not medically stable to d/c. Subjective: 9/11Pt seen today he is ill appearing. States he feels little better and doesn't t complaints of any chest pain or sob or any other complaints but looks more tired.   9/12 Pt is alert/awake/oriented and reports cough/sob/ hemoptysis . D/w him cta results and family about this unfortunate news. Pt has a h/o smoking.   9/13 Pt seen today he is resting , oriented and denies any complaints and questions.   9/14 pt reports cont cough and hemoptysis and Denies any chest pain and ros is otherwise negative.   Objective: Vitals:   04/08/20 0732 04/08/20 0843 04/08/20 1007 04/08/20 1142  BP: (!) 159/84   (!) 145/66  Pulse: 60   66  Resp: 18   20  Temp: (!) 97 F (36.1 C)   97.6 F (36.4 C)  TempSrc: Axillary   Oral  SpO2: 91% 93% 95% 95%  Weight:      Height:        Intake/Output Summary (Last 24 hours) at 04/08/2020 1422 Last data filed at 04/08/2020 1100 Gross per 24 hour  Intake 796.32 ml  Output 350 ml  Net 446.32 ml   Filed Weights   04/03/20 1257 04/04/20 2048  Weight: 84.8 kg 74.9 kg    Examination: Blood pressure (!) 145/66, pulse 66, temperature 97.6 F (36.4 C), temperature source Oral, resp. rate 20, height 5\' 11"  (1.803 m), weight 74.9 kg, SpO2 95 %. General exam: Appears calm and comfortable  Respiratory system: Clear to auscultation. Respiratory effort normal. Cardiovascular system: S1 & S2 heard, RRR. No JVD, murmurs, rubs, gallops or clicks. No pedal edema. Gastrointestinal system: Abdomen is nondistended, soft and nontender. No organomegaly or masses felt. Normal bowel sounds heard. Central nervous system: Alert and oriented. No focal neurological deficits. Extremities: Symmetric 5 x 5 power. Skin: No rashes,  lesions or ulcers Psychiatry: Judgement and insight appear normal. Mood & affect appropriate.   Data Reviewed: I have personally reviewed following labs and imaging studies  I/O last 3 completed shifts: In: 1019.3 [P.O.:840; IV Piggyback:179.3] Out: 350 [Urine:350] Total I/O In: 237 [P.O.:237] Out: -  Lab Results  Component Value Date   CREATININE 1.73 (H) 04/08/2020   CREATININE 1.90 (H) 04/07/2020   CREATININE 1.90 (H) 04/06/2020   CBC: Recent Labs  Lab 04/04/20 1036 04/04/20 1036 04/05/20 0531 04/05/20 1917 04/06/20 0453 04/07/20 0439 04/08/20 0512  WBC 2.9*  --  6.5  --  3.4* 5.6 6.6  NEUTROABS 2.3  --  5.6  --  2.9 4.9 5.8  HGB 15.0   < > 15.6 14.2 14.7 15.4  14.7  HCT 42.4   < > 44.8 42.9 43.1 46.3 43.9  MCV 88.7  --  90.9  --  91.5 93.2 94.4  PLT 146*  --  148*  --  149* 170 167   < > = values in this interval not displayed.   Basic Metabolic Panel: Recent Labs  Lab 04/04/20 1036 04/04/20 1036 04/05/20 0531 04/05/20 0805 04/06/20 0453 04/07/20 0439 04/08/20 0512  NA 136  --  142  --  141 144 144  K 5.0   < > 5.4* 5.1 5.0 5.3* 4.7  CL 108  --  112*  --  111 109 110  CO2 21*  --  21*  --  21* 26 25  GLUCOSE 143*  --  110*  --  144* 160* 187*  BUN 64*  --  57*  --  50* 57* 56*  CREATININE 2.79*  --  2.21*  --  1.90* 1.90* 1.73*  CALCIUM 7.5*  --  8.0*  --  8.0* 8.4* 8.1*  MG 2.2  --  2.4  --   --   --   --    < > = values in this interval not displayed.   GFR: Estimated Creatinine Clearance: 42.7 mL/min (A) (by C-G formula based on SCr of 1.73 mg/dL (H)). Liver Function Tests: Recent Labs  Lab 04/04/20 1036 04/05/20 0531 04/06/20 0453 04/07/20 0439 04/08/20 0512  AST 24 58* 46* 33 23  ALT 17 35 42 41 32  ALKPHOS 42 38 43 50 43  BILITOT 0.5 0.6 0.3 0.5 0.4  PROT 6.5 6.7 6.5 7.1 6.4*  ALBUMIN 2.6* 2.8* 2.7* 2.8* 2.7*   Anemia Panel: Recent Labs    04/07/20 0439 04/08/20 0512  FERRITIN 1,608* 1,407*   Sepsis Labs: Recent Labs  Lab  04/03/20 1301 04/03/20 2331 04/03/20 2332  PROCALCITON  --  0.50  --   LATICACIDVEN 1.2  --  1.3    Recent Results (from the past 240 hour(s))  Blood culture (routine x 2)     Status: None   Collection Time: 04/03/20 11:31 PM   Specimen: BLOOD  Result Value Ref Range Status   Specimen Description BLOOD LEFT ANTECUBITAL  Final   Special Requests   Final    BOTTLES DRAWN AEROBIC AND ANAEROBIC Blood Culture adequate volume   Culture   Final    NO GROWTH 5 DAYS Performed at [facility], [address] [place], [place] [address]    Report Status 04/08/2020 FINAL  Final  SARS Coronavirus 2 by RT PCR (hospital order, performed in [place] hospital lab) Nasopharyngeal Nasopharyngeal Swab     Status: Abnormal   Collection Time: 04/03/20 11:31 PM   Specimen: Nasopharyngeal Swab  Result Value Ref Range Status   SARS Coronavirus 2 POSITIVE (A) NEGATIVE Final    Comment: RESULT CALLED TO, READ BACK BY AND VERIFIED WITH: [employer] RN 0129 04/04/20 HNM (NOTE) SARS-CoV-2 target nucleic acids are DETECTED  SARS-CoV-2 RNA is generally detectable in upper respiratory specimens  during the acute phase of infection.  Positive results are indicative  of the presence of the identified virus, but do not rule out bacterial infection or co-infection with other pathogens not detected by the test.  Clinical correlation with patient history and  other diagnostic information is necessary to determine patient infection status.  The expected result is negative.  Fact Sheet for Patients:   StrictlyIdeas.no   Fact Sheet for Healthcare Providers:   BankingDealers.co.za  This test is not yet approved or cleared by the [place] and  has been authorized for detection and/or diagnosis of SARS-CoV-2 by FDA under an Emergency Use Authorization (EUA).  This EUA will remain in effect (meaning this tes t can be used) for the duration of   the COVID-19 declaration under Section 564(b)(1) of the Act, 21 U.S.C. section 360-bbb-3(b)(1), unless the authorization is terminated or revoked sooner.  Performed at [facility], [address] [place], [place] [address]   Blood culture (routine x 2)     Status: None   Collection Time: 04/03/20 11:32 PM   Specimen: BLOOD  Result Value Ref Range Status   Specimen Description BLOOD BLOOD LEFT HAND  Final   Special Requests   Final    BOTTLES DRAWN AEROBIC AND ANAEROBIC Blood Culture adequate volume   Culture   Final    NO GROWTH 5 DAYS Performed at [facility], [address], [place], [place] [address]    Report Status 04/08/2020 FINAL  Final  Culture, sputum-assessment     Status: None   Collection Time: 04/04/20  4:35 AM   Specimen: Expectorated Sputum  Result Value Ref Range Status   Specimen Description EXPECTORATED SPUTUM  Final   Special Requests NONE  Final   Sputum evaluation   Final    Sputum specimen not acceptable for testing.  Please recollect.   Yancey Flemings RN AT 9485 ON 04/04/20 [facility] Performed at [facility], [address], [place], [place] [address]    Report Status 04/04/2020 FINAL  Final     Radiology Studies: No results found. Scheduled Meds: . amLODipine  10 mg Oral Daily  . atorvastatin  20 mg Oral QPM  . carvedilol  6.25 mg Oral BID WC  . feeding supplement (ENSURE ENLIVE)  237 mL Oral TID BM  . heparin  5,000 Units Subcutaneous Q8H  . [START ON 04/09/2020] pneumococcal 23 valent vaccine  0.5 mL Intramuscular Tomorrow-1000  . zinc sulfate  220 mg Oral Daily   Continuous Infusions: . sodium chloride    . remdesivir 100 mg in NS 100 mL 100 mg (04/08/20 1028)     LOS: 4 days   Para Skeans, MD [facility] Pager [phone] If 7PM-7AM, please contact night-coverage [email or web address] Password [email or web address] 04/08/2020, 2:22 PM

## 2020-04-08 NOTE — Progress Notes (Signed)
Pt AAox4,drowsy and withdrawn. VS stable, Oxygen increased today. On 8L HFNC and face mask spo2 98%. Pt 02 Desats to 70s- 80s when sleeping Pt noted to be mouth breathing. Pt with no complaints today. Continue to couch of frothy pink tinged sputum.  Pt stated wife was at home recovering from Covid and didn't need to be updated.  Will continue to monitor

## 2020-04-09 LAB — COMPREHENSIVE METABOLIC PANEL
ALT: 29 U/L (ref 0–44)
AST: 20 U/L (ref 15–41)
Albumin: 2.6 g/dL — ABNORMAL LOW (ref 3.5–5.0)
Alkaline Phosphatase: 44 U/L (ref 38–126)
Anion gap: 9 (ref 5–15)
BUN: 52 mg/dL — ABNORMAL HIGH (ref 8–23)
CO2: 25 mmol/L (ref 22–32)
Calcium: 8.1 mg/dL — ABNORMAL LOW (ref 8.9–10.3)
Chloride: 111 mmol/L (ref 98–111)
Creatinine, Ser: 1.65 mg/dL — ABNORMAL HIGH (ref 0.61–1.24)
GFR calc Af Amer: 48 mL/min — ABNORMAL LOW (ref >60)
GFR calc non Af Amer: 42 mL/min — ABNORMAL LOW (ref >60)
Glucose, Bld: 133 mg/dL — ABNORMAL HIGH (ref 70–99)
Potassium: 4.5 mmol/L (ref 3.5–5.1)
Sodium: 145 mmol/L (ref 135–145)
Total Bilirubin: 0.5 mg/dL (ref 0.3–1.2)
Total Protein: 6 g/dL — ABNORMAL LOW (ref 6.5–8.1)

## 2020-04-09 LAB — CBC WITH DIFFERENTIAL/PLATELET
Abs Immature Granulocytes: 0.09 10*3/uL — ABNORMAL HIGH (ref 0.00–0.07)
Basophils Absolute: 0 10*3/uL (ref 0.0–0.1)
Basophils Relative: 0 %
Eosinophils Absolute: 0 10*3/uL (ref 0.0–0.5)
Eosinophils Relative: 0 %
HCT: 44.7 % (ref 39.0–52.0)
Hemoglobin: 14.9 g/dL (ref 13.0–17.0)
Immature Granulocytes: 1 %
Lymphocytes Relative: 6 %
Lymphs Abs: 0.5 10*3/uL — ABNORMAL LOW (ref 0.7–4.0)
MCH: 31.5 pg (ref 26.0–34.0)
MCHC: 33.3 g/dL (ref 30.0–36.0)
MCV: 94.5 fL (ref 80.0–100.0)
Monocytes Absolute: 0.8 10*3/uL (ref 0.1–1.0)
Monocytes Relative: 9 %
Neutro Abs: 6.9 10*3/uL (ref 1.7–7.7)
Neutrophils Relative %: 84 %
Platelets: 180 10*3/uL (ref 150–400)
RBC: 4.73 MIL/uL (ref 4.22–5.81)
RDW: 12.3 % (ref 11.5–15.5)
WBC: 8.2 10*3/uL (ref 4.0–10.5)
nRBC: 0 % (ref 0.0–0.2)

## 2020-04-09 LAB — C-REACTIVE PROTEIN: CRP: 0.6 mg/dL (ref <1.0)

## 2020-04-09 LAB — FIBRIN DERIVATIVES D-DIMER (ARMC ONLY): Fibrin derivatives D-dimer (ARMC): 574.86 ng/mL (FEU) — ABNORMAL HIGH (ref 0.00–499.00)

## 2020-04-09 LAB — LACTATE DEHYDROGENASE: LDH: 311 U/L — ABNORMAL HIGH (ref 98–192)

## 2020-04-09 LAB — FERRITIN: Ferritin: 984 ng/mL — ABNORMAL HIGH (ref 24–336)

## 2020-04-09 MED ORDER — BARICITINIB 2 MG PO TABS: 4.0000 mg | ORAL_TABLET | Freq: Every day | ORAL | Status: DC

## 2020-04-09 MED ORDER — ASPIRIN EC 81 MG PO TBEC
81.0000 mg | DELAYED_RELEASE_TABLET | Freq: Every day | ORAL | Status: DC
  Administered 2020-04-09 – 2020-04-13 (×5): 81 mg via ORAL
  Filled 2020-04-09 (×5): qty 1

## 2020-04-09 MED ORDER — BARICITINIB 2 MG PO TABS
2.0000 mg | ORAL_TABLET | Freq: Every day | ORAL | Status: DC
  Administered 2020-04-09 – 2020-04-13 (×5): 2 mg via ORAL
  Filled 2020-04-09 (×5): qty 1

## 2020-04-09 MED ORDER — METHYLPREDNISOLONE SODIUM SUCC 40 MG IJ SOLR
0.5000 mg/kg | Freq: Two times a day (BID) | INTRAMUSCULAR | Status: DC
  Administered 2020-04-09 – 2020-04-13 (×9): 37.6 mg via INTRAVENOUS
  Filled 2020-04-09 (×9): qty 1

## 2020-04-09 NOTE — Progress Notes (Signed)
PROGRESS NOTE    Martin Jennings  XNT:[id number] DOB: [birth year] DOA: 04/03/2020 PCP: System, Provider Not In   Brief Narrative: Martin Jennings is a 69 y.o. male with medical history significant of bladder cancer, hypertension, peripheral vascular disease, hyperlipidemia who apparently contracted COVID-19 infection with his wife 3 weeks ago from the daughter.   They have not been symptomatic since then including the wife who is still homesick.  In the last few days he is getting more progressively short of breath weak and tired.  Patient came to the ER where he was seen and evaluated.  He was hypoxic in the ER requiring oxygen now.   Upon arrival to ED, Temperature 98.9 blood pressure 104/63 pulse 80 respiratory rate of 26 oxygen sats 94% on 2 L.  White count 3.4 hemoglobin 15.4 platelets 138.  Sodium 133 potassium 4.5 chloride 105 CO2 of 17 BUN 57 creatinine 3.70 and calcium 7.7 glucose 122.  Chest x-ray showed Space opacity consistent with pneumonia in the anterior segment of the right upper lobe.  Otherwise clear bilaterally.    Patient admitted to hospitalist service with acute hypoxic respiratory failure secondary to COVID-19 pneumonia.    Assessment & Plan:   Principal Problem:   CAP (community acquired pneumonia) Active Problems:   Essential hypertension   Hyperlipidemia   PVD (peripheral vascular disease) ([place])   COVID-19 virus infection   ARF (acute renal failure) ([place])  Acute hypoxic respiratory failure secondary to CAP/Right lobar pneumonia/COVID-19 pneumonia:  Although patient states that he feels better, he is still on 9 to 10 L of high flow nasal cannula oxygen.  We will continue current COVID-19 medications which include remdesivir and dexamethasone.  We will also start him on baricitinib. Actemra/Baricitinib  off label use - patient denies any known history of active diverticulitis, tuberculosis or hepatitis, he could not understand much about baricitinib.  I called and  discussed with his son over the phone and understands the risks and benefits and wants to proceed with Actemra/baricitinib treatment.  Continue zinc and Solu-Medrol.   SpO2: 95 % O2 Flow Rate (L/min): 9 L/min COVID-19 Labs  Recent Labs    04/07/20 0439 04/08/20 0512 04/09/20 0513  FERRITIN 1,608* 1,407* 984*  LDH 364* 319* 311*  CRP 1.5* 0.7 0.6   Lab Results  Component Value Date   SARSCOV2NAA POSITIVE (A) 04/03/2020   Hemoptysis/ RUL mass Stable CTA found lung mass c/w cancer.d/w family and pt and consulted pulm and oncology.Per oncology pt to have PET scan as outpatient once stable.SPutum cytology pending. Supplemental oxygen.Outpatient bronchoscopy per PCCM.   Thrombocytopenia/hemoptysis: Resolved.  Will resume aspirin.  Essential hypertension: Fairly controlled.  Continue medical.  DVT prophylaxis: Subcutaneous heparin Code Status: Full Code Family Communication: Discussed with son in detail. Disposition Plan:    Status is: Inpatient Not inpatient appropriate, will call UM team and downgrade to OBS.   Dispo: The patient is from: Home              Anticipated d/c is to: Home              Anticipated d/c date is: > 3 days              Patient currently is not medically stable to d/c. Subjective: Seen and examined.  States that his breathing is better however he is requiring more oxygen than yesterday.  No other complaint.  Objective: Vitals:   04/08/20 1931 04/09/20 0450 04/09/20 0707 04/09/20 0727  BP: 132/64  135/64  (!) 161/87  Pulse: (!) 59 (!) 52  60  Resp: 18 17  18   Temp: 98 F (36.7 C) 98.4 F (36.9 C)  98.4 F (36.9 C)  TempSrc: Oral   Oral  SpO2: 99% 95% 98% 95%  Weight:      Height:       No intake or output data in the 24 hours ending 04/09/20 1102 Filed Weights   04/03/20 1257 04/04/20 2048  Weight: 84.8 kg 74.9 kg    Examination: Blood pressure (!) 161/87, pulse 60, temperature 98.4 F (36.9 C), temperature source Oral, resp. rate 18,  height 5\' 11"  (1.803 m), weight 74.9 kg, SpO2 95 %. General exam: Appears tired and sick Respiratory system: Clear to auscultation with diminished breath sounds at the bases. Respiratory effort normal. Cardiovascular system: S1 & S2 heard, RRR. No JVD, murmurs, rubs, gallops or clicks. No pedal edema. Gastrointestinal system: Abdomen is nondistended, soft and nontender. No organomegaly or masses felt. Normal bowel sounds heard. Central nervous system: Alert and oriented. No focal neurological deficits. Extremities: Symmetric 5 x 5 power. Skin: No rashes, lesions or ulcers.    Data Reviewed: I have personally reviewed following labs and imaging studies  I/O last 3 completed shifts: In: 316.3 [P.O.:237; IV Piggyback:79.3] Out: 350 [Urine:350] No intake/output data recorded. Lab Results  Component Value Date   CREATININE 1.65 (H) 04/09/2020   CREATININE 1.73 (H) 04/08/2020   CREATININE 1.90 (H) 04/07/2020   CBC: Recent Labs  Lab 04/05/20 0531 04/05/20 0531 04/05/20 1917 04/06/20 0453 04/07/20 0439 04/08/20 0512 04/09/20 0513  WBC 6.5  --   --  3.4* 5.6 6.6 8.2  NEUTROABS 5.6  --   --  2.9 4.9 5.8 6.9  HGB 15.6   < > 14.2 14.7 15.4 14.7 14.9  HCT 44.8   < > 42.9 43.1 46.3 43.9 44.7  MCV 90.9  --   --  91.5 93.2 94.4 94.5  PLT 148*  --   --  149* 170 167 180   < > = values in this interval not displayed.   Basic Metabolic Panel: Recent Labs  Lab 04/04/20 1036 04/04/20 1036 04/05/20 0531 04/05/20 0531 04/05/20 0805 04/06/20 0453 04/07/20 0439 04/08/20 0512 04/09/20 0513  NA 136   < > 142  --   --  141 144 144 145  K 5.0   < > 5.4*   < > 5.1 5.0 5.3* 4.7 4.5  CL 108   < > 112*  --   --  111 109 110 111  CO2 21*   < > 21*  --   --  21* 26 25 25   GLUCOSE 143*   < > 110*  --   --  144* 160* 187* 133*  BUN 64*   < > 57*  --   --  50* 57* 56* 52*  CREATININE 2.79*   < > 2.21*  --   --  1.90* 1.90* 1.73* 1.65*  CALCIUM 7.5*   < > 8.0*  --   --  8.0* 8.4* 8.1* 8.1*  MG  2.2  --  2.4  --   --   --   --   --   --    < > = values in this interval not displayed.   GFR: Estimated Creatinine Clearance: 44.8 mL/min (A) (by C-G formula based on SCr of 1.65 mg/dL (H)). Liver Function Tests: Recent Labs  Lab 04/05/20 0531 04/06/20 0453 04/07/20 0439 04/08/20 9833  04/09/20 0513  AST 58* 46* 33 23 20  ALT 35 42 41 32 29  ALKPHOS 38 43 50 43 44  BILITOT 0.6 0.3 0.5 0.4 0.5  PROT 6.7 6.5 7.1 6.4* 6.0*  ALBUMIN 2.8* 2.7* 2.8* 2.7* 2.6*   Anemia Panel: Recent Labs    04/08/20 0512 04/09/20 0513  FERRITIN 1,407* 984*   Sepsis Labs: Recent Labs  Lab 04/03/20 1301 04/03/20 2331 04/03/20 2332  PROCALCITON  --  0.50  --   LATICACIDVEN 1.2  --  1.3    Recent Results (from the past 240 hour(s))  Blood culture (routine x 2)     Status: None   Collection Time: 04/03/20 11:31 PM   Specimen: BLOOD  Result Value Ref Range Status   Specimen Description BLOOD LEFT ANTECUBITAL  Final   Special Requests   Final    BOTTLES DRAWN AEROBIC AND ANAEROBIC Blood Culture adequate volume   Culture   Final    NO GROWTH 5 DAYS Performed at [facility], [address] [place], [place] [address]    Report Status 04/08/2020 FINAL  Final  SARS Coronavirus 2 by RT PCR (hospital order, performed in [place] hospital lab) Nasopharyngeal Nasopharyngeal Swab     Status: Abnormal   Collection Time: 04/03/20 11:31 PM   Specimen: Nasopharyngeal Swab  Result Value Ref Range Status   SARS Coronavirus 2 POSITIVE (A) NEGATIVE Final    Comment: RESULT CALLED TO, READ BACK BY AND VERIFIED WITH: [employer] RN 0129 04/04/20 HNM (NOTE) SARS-CoV-2 target nucleic acids are DETECTED  SARS-CoV-2 RNA is generally detectable in upper respiratory specimens  during the acute phase of infection.  Positive results are indicative  of the presence of the identified virus, but do not rule out bacterial infection or co-infection with other pathogens not detected by the test.   Clinical correlation with patient history and  other diagnostic information is necessary to determine patient infection status.  The expected result is negative.  Fact Sheet for Patients:   StrictlyIdeas.no   Fact Sheet for Healthcare Providers:   BankingDealers.co.za    This test is not yet approved or cleared by the [place] FDA and  has been authorized for detection and/or diagnosis of SARS-CoV-2 by FDA under an Emergency Use Authorization (EUA).  This EUA will remain in effect (meaning this tes t can be used) for the duration of  the COVID-19 declaration under Section 564(b)(1) of the Act, 21 U.S.C. section 360-bbb-3(b)(1), unless the authorization is terminated or revoked sooner.  Performed at [facility], [address] [place], [place] [address]   Blood culture (routine x 2)     Status: None   Collection Time: 04/03/20 11:32 PM   Specimen: BLOOD  Result Value Ref Range Status   Specimen Description BLOOD BLOOD LEFT HAND  Final   Special Requests   Final    BOTTLES DRAWN AEROBIC AND ANAEROBIC Blood Culture adequate volume   Culture   Final    NO GROWTH 5 DAYS Performed at [facility], [address] [place], [place] [address]    Report Status 04/08/2020 FINAL  Final  Culture, sputum-assessment     Status: None   Collection Time: 04/04/20  4:35 AM   Specimen: Expectorated Sputum  Result Value Ref Range Status   Specimen Description EXPECTORATED SPUTUM  Final   Special Requests NONE  Final   Sputum evaluation   Final    Sputum specimen not acceptable for testing.  Please recollect.   Yancey Flemings RN AT 4917 ON 04/04/20 [facility] Performed at [facility], [address], [place], [place] [address]    Report Status 04/04/2020 FINAL  Final     Radiology Studies: No results found. Scheduled Meds: . amLODipine  10 mg Oral Daily  . atorvastatin  20 mg Oral QPM  . carvedilol  6.25 mg  Oral BID WC  . feeding supplement (ENSURE ENLIVE)  237 mL Oral TID BM  . heparin  5,000 Units Subcutaneous Q8H  . methylPREDNISolone (SOLU-MEDROL) injection  0.5 mg/kg Intravenous Q12H  . pneumococcal 23 valent vaccine  0.5 mL Intramuscular Tomorrow-1000  . zinc sulfate  220 mg Oral Daily   Continuous Infusions: . sodium chloride 100 mL/hr (04/08/20 2126)     LOS: 5 days   Darliss [name], MD [facility] If 7PM-7AM, please contact night-coverage [email or web address] 04/09/2020, 11:02 AM

## 2020-04-10 LAB — CBC WITH DIFFERENTIAL/PLATELET
Abs Immature Granulocytes: 0.06 10*3/uL (ref 0.00–0.07)
Basophils Absolute: 0 10*3/uL (ref 0.0–0.1)
Basophils Relative: 0 %
Eosinophils Absolute: 0 10*3/uL (ref 0.0–0.5)
Eosinophils Relative: 0 %
HCT: 41.3 % (ref 39.0–52.0)
Hemoglobin: 14.1 g/dL (ref 13.0–17.0)
Immature Granulocytes: 2 %
Lymphocytes Relative: 10 %
Lymphs Abs: 0.3 10*3/uL — ABNORMAL LOW (ref 0.7–4.0)
MCH: 31.3 pg (ref 26.0–34.0)
MCHC: 34.1 g/dL (ref 30.0–36.0)
MCV: 91.6 fL (ref 80.0–100.0)
Monocytes Absolute: 0.2 10*3/uL (ref 0.1–1.0)
Monocytes Relative: 5 %
Neutro Abs: 3 10*3/uL (ref 1.7–7.7)
Neutrophils Relative %: 83 %
Platelets: 175 10*3/uL (ref 150–400)
RBC: 4.51 MIL/uL (ref 4.22–5.81)
RDW: 12 % (ref 11.5–15.5)
WBC: 3.6 10*3/uL — ABNORMAL LOW (ref 4.0–10.5)
nRBC: 0 % (ref 0.0–0.2)

## 2020-04-10 LAB — COMPREHENSIVE METABOLIC PANEL
ALT: 26 U/L (ref 0–44)
AST: 17 U/L (ref 15–41)
Albumin: 2.5 g/dL — ABNORMAL LOW (ref 3.5–5.0)
Alkaline Phosphatase: 42 U/L (ref 38–126)
Anion gap: 7 (ref 5–15)
BUN: 49 mg/dL — ABNORMAL HIGH (ref 8–23)
CO2: 27 mmol/L (ref 22–32)
Calcium: 8.2 mg/dL — ABNORMAL LOW (ref 8.9–10.3)
Chloride: 107 mmol/L (ref 98–111)
Creatinine, Ser: 1.59 mg/dL — ABNORMAL HIGH (ref 0.61–1.24)
GFR calc Af Amer: 51 mL/min — ABNORMAL LOW (ref >60)
GFR calc non Af Amer: 44 mL/min — ABNORMAL LOW (ref >60)
Glucose, Bld: 193 mg/dL — ABNORMAL HIGH (ref 70–99)
Potassium: 5 mmol/L (ref 3.5–5.1)
Sodium: 141 mmol/L (ref 135–145)
Total Bilirubin: 0.6 mg/dL (ref 0.3–1.2)
Total Protein: 5.8 g/dL — ABNORMAL LOW (ref 6.5–8.1)

## 2020-04-10 LAB — FERRITIN: Ferritin: 1026 ng/mL — ABNORMAL HIGH (ref 24–336)

## 2020-04-10 LAB — LACTATE DEHYDROGENASE: LDH: 264 U/L — ABNORMAL HIGH (ref 98–192)

## 2020-04-10 LAB — GLUCOSE, CAPILLARY
Glucose-Capillary: 253 mg/dL — ABNORMAL HIGH (ref 70–99)
Glucose-Capillary: 256 mg/dL — ABNORMAL HIGH (ref 70–99)

## 2020-04-10 LAB — C-REACTIVE PROTEIN: CRP: 1.9 mg/dL — ABNORMAL HIGH (ref <1.0)

## 2020-04-10 LAB — FIBRIN DERIVATIVES D-DIMER (ARMC ONLY): Fibrin derivatives D-dimer (ARMC): 448.58 ng/mL (FEU) (ref 0.00–499.00)

## 2020-04-10 MED ORDER — INSULIN ASPART 100 UNIT/ML ~~LOC~~ SOLN
0.0000 [IU] | Freq: Three times a day (TID) | SUBCUTANEOUS | Status: DC
  Administered 2020-04-10: 5 [IU] via SUBCUTANEOUS
  Administered 2020-04-11 – 2020-04-12 (×6): 2 [IU] via SUBCUTANEOUS
  Administered 2020-04-13: 12:00:00 5 [IU] via SUBCUTANEOUS
  Administered 2020-04-13: 09:00:00 2 [IU] via SUBCUTANEOUS
  Filled 2020-04-10 (×9): qty 1

## 2020-04-10 MED ORDER — INSULIN ASPART 100 UNIT/ML ~~LOC~~ SOLN
0.0000 [IU] | Freq: Every day | SUBCUTANEOUS | Status: DC
  Administered 2020-04-10: 3 [IU] via SUBCUTANEOUS
  Administered 2020-04-11: 2 [IU] via SUBCUTANEOUS
  Filled 2020-04-10 (×2): qty 1

## 2020-04-10 NOTE — Progress Notes (Addendum)
PROGRESS NOTE    Martin Jennings  QBH:[id number] DOB: 16-Jun-1951 DOA: 04/03/2020 PCP: System, Provider Not In   Brief Narrative: Martin Jennings is a 69 y.o. male with medical history significant of bladder cancer, hypertension, peripheral vascular disease, hyperlipidemia who apparently contracted COVID-19 infection with his wife 3 weeks ago from the daughter.   They have been symptomatic since then including the wife who is still homesick.  In the last few days he got progressively short of breath weak and tired.  Patient came to the ER where he was seen and evaluated.    Upon arrival to ED, Temperature 98.9 blood pressure 104/63 pulse 80 respiratory rate of 26 oxygen sats 94% on 2 L.  White count 3.4 hemoglobin 15.4 platelets 138.  Sodium 133 potassium 4.5 chloride 105 CO2 of 17 BUN 57 creatinine 3.70 and calcium 7.7 glucose 122.  Chest x-ray showed Space opacity consistent with pneumonia in the anterior segment of the right upper lobe.  Otherwise clear bilaterally.    Patient admitted to hospitalist service with acute hypoxic respiratory failure secondary to COVID-19 pneumonia.  He was started on remdesivir per pharmacy protocol as well as Solu-Medrol.  His oxygen demand increased.  He was then started on baricitinib on 04/09/2020.    Assessment & Plan:   Principal Problem:   CAP (community acquired pneumonia) Active Problems:   Essential hypertension   Hyperlipidemia   PVD (peripheral vascular disease) ([place])   COVID-19 virus infection   ARF (acute renal failure) ([place])  Acute hypoxic respiratory failure secondary to CAP/Right lobar pneumonia/COVID-19 pneumonia:  Although patient states that he feels better and he is down to 7 L of high flow oxygen compared to 10 L yesterday.  He is able to speak in full sentences.  We will continue following: Remdesivir per pharmacy protocol-completed on 04/09/2020 Baricitinib-started on 04/09/2020 IV Solu-Medrol Supportive care, antitussives,  bronchodilator, zinc and vitamin C. Patient was encouraged to prone, out of bed to chair, to use incentive spirometry and flutter valve.   Hemoptysis/ RUL mass Stable CTA found lung mass c/w cancer.d/w family and pt and consulted pulm and oncology.Per oncology pt to have PET scan as outpatient once stable.SPutum cytology pending. Supplemental oxygen.Outpatient bronchoscopy per PCCM.   Thrombocytopenia/hemoptysis: Resolved.  Will continue aspirin.  Essential hypertension: Fairly controlled.  Continue current regimen.  CKD stage IIIb: Stable.  DVT prophylaxis: Subcutaneous heparin Code Status: Full Code Family Communication: Discussed with son in detail. Disposition Plan:    Status is: Inpatient Not inpatient appropriate, will call UM team and downgrade to OBS.   Dispo: The patient is from: Home              Anticipated d/c is to: Home              Anticipated d/c date is: > 3 days              Patient currently is not medically stable to d/c. Subjective: Seen and examined.  Feels better than yesterday.  Breathing improving.  No other complaint.  Objective: Vitals:   04/10/20 0003 04/10/20 0429 04/10/20 0443 04/10/20 0708  BP: 129/77 134/60 132/66 (!) 146/63  Pulse: (!) 52 65 (!) 51 (!) 57  Resp: 17 20 18 18   Temp: 98.1 F (36.7 C) 97.7 F (36.5 C) 98.3 F (36.8 C) 98.4 F (36.9 C)  TempSrc: Oral Oral  Oral  SpO2: 99% 99% 97% 100%  Weight:      Height:  Intake/Output Summary (Last 24 hours) at 04/10/2020 1128 Last data filed at 04/10/2020 1100 Gross per 24 hour  Intake 240 ml  Output --  Net 240 ml   Filed Weights   04/03/20 1257 04/04/20 2048  Weight: 84.8 kg 74.9 kg    Examination: Blood pressure (!) 146/63, pulse (!) 57, temperature 98.4 F (36.9 C), temperature source Oral, resp. rate 18, height 5\' 11"  (1.803 m), weight 74.9 kg, SpO2 100 %.  General exam: Appears calm and comfortable  Respiratory system: Diminished breath sounds at the bases  bilaterally. Respiratory effort normal. Cardiovascular system: S1 & S2 heard, RRR. No JVD, murmurs, rubs, gallops or clicks. No pedal edema. Gastrointestinal system: Abdomen is nondistended, soft and nontender. No organomegaly or masses felt. Normal bowel sounds heard. Central nervous system: Alert and oriented. No focal neurological deficits. Extremities: Symmetric 5 x 5 power. Skin: No rashes, lesions or ulcers.  Psychiatry: Judgement and insight appear normal. Mood & affect appropriate.     Data Reviewed: I have personally reviewed following labs and imaging studies  I/O last 3 completed shifts: In: 120 [P.O.:120] Out: -  Total I/O In: 240 [P.O.:240] Out: -  Lab Results  Component Value Date   CREATININE 1.59 (H) 04/10/2020   CREATININE 1.65 (H) 04/09/2020   CREATININE 1.73 (H) 04/08/2020   CBC: Recent Labs  Lab 04/06/20 0453 04/07/20 0439 04/08/20 0512 04/09/20 0513 04/10/20 0455  WBC 3.4* 5.6 6.6 8.2 3.6*  NEUTROABS 2.9 4.9 5.8 6.9 3.0  HGB 14.7 15.4 14.7 14.9 14.1  HCT 43.1 46.3 43.9 44.7 41.3  MCV 91.5 93.2 94.4 94.5 91.6  PLT 149* 170 167 180 063   Basic Metabolic Panel: Recent Labs  Lab 04/04/20 1036 04/04/20 1036 04/05/20 0531 04/05/20 0805 04/06/20 0453 04/07/20 0439 04/08/20 0512 04/09/20 0513 04/10/20 0455  NA 136   < > 142   < > 141 144 144 145 141  K 5.0   < > 5.4*   < > 5.0 5.3* 4.7 4.5 5.0  CL 108   < > 112*   < > 111 109 110 111 107  CO2 21*   < > 21*   < > 21* 26 25 25 27   GLUCOSE 143*   < > 110*   < > 144* 160* 187* 133* 193*  BUN 64*   < > 57*   < > 50* 57* 56* 52* 49*  CREATININE 2.79*   < > 2.21*   < > 1.90* 1.90* 1.73* 1.65* 1.59*  CALCIUM 7.5*   < > 8.0*   < > 8.0* 8.4* 8.1* 8.1* 8.2*  MG 2.2  --  2.4  --   --   --   --   --   --    < > = values in this interval not displayed.   GFR: Estimated Creatinine Clearance: 46.5 mL/min (A) (by C-G formula based on SCr of 1.59 mg/dL (H)). Liver Function Tests: Recent Labs  Lab  04/06/20 0453 04/07/20 0439 04/08/20 0512 04/09/20 0513 04/10/20 0455  AST 46* 33 23 20 17   ALT 42 41 32 29 26  ALKPHOS 43 50 43 44 42  BILITOT 0.3 0.5 0.4 0.5 0.6  PROT 6.5 7.1 6.4* 6.0* 5.8*  ALBUMIN 2.7* 2.8* 2.7* 2.6* 2.5*   Anemia Panel: Recent Labs    04/09/20 0513 04/10/20 0455  FERRITIN 984* 1,026*   Sepsis Labs: Recent Labs  Lab 04/03/20 1301 04/03/20 2331 04/03/20 2332  PROCALCITON  --  0.50  --  LATICACIDVEN 1.2  --  1.3    Recent Results (from the past 240 hour(s))  Blood culture (routine x 2)     Status: None   Collection Time: 04/03/20 11:31 PM   Specimen: BLOOD  Result Value Ref Range Status   Specimen Description BLOOD LEFT ANTECUBITAL  Final   Special Requests   Final    BOTTLES DRAWN AEROBIC AND ANAEROBIC Blood Culture adequate volume   Culture   Final    NO GROWTH 5 DAYS Performed at [facility], [address] [place], [place] [address]    Report Status 04/08/2020 FINAL  Final  SARS Coronavirus 2 by RT PCR (hospital order, performed in [place] hospital lab) Nasopharyngeal Nasopharyngeal Swab     Status: Abnormal   Collection Time: 04/03/20 11:31 PM   Specimen: Nasopharyngeal Swab  Result Value Ref Range Status   SARS Coronavirus 2 POSITIVE (A) NEGATIVE Final    Comment: RESULT CALLED TO, READ BACK BY AND VERIFIED WITH: CHRISSY BRAND RN 0129 04/04/20 HNM (NOTE) SARS-CoV-2 target nucleic acids are DETECTED  SARS-CoV-2 RNA is generally detectable in upper respiratory specimens  during the acute phase of infection.  Positive results are indicative  of the presence of the identified virus, but do not rule out bacterial infection or co-infection with other pathogens not detected by the test.  Clinical correlation with patient history and  other diagnostic information is necessary to determine patient infection status.  The expected result is negative.  Fact Sheet for Patients:    StrictlyIdeas.no   Fact Sheet for Healthcare Providers:   BankingDealers.co.za    This test is not yet approved or cleared by the [place] FDA and  has been authorized for detection and/or diagnosis of SARS-CoV-2 by FDA under an Emergency Use Authorization (EUA).  This EUA will remain in effect (meaning this tes t can be used) for the duration of  the COVID-19 declaration under Section 564(b)(1) of the Act, 21 U.S.C. section 360-bbb-3(b)(1), unless the authorization is terminated or revoked sooner.  Performed at [facility], [address] [place], [place] [address]   Blood culture (routine x 2)     Status: None   Collection Time: 04/03/20 11:32 PM   Specimen: BLOOD  Result Value Ref Range Status   Specimen Description BLOOD BLOOD LEFT HAND  Final   Special Requests   Final    BOTTLES DRAWN AEROBIC AND ANAEROBIC Blood Culture adequate volume   Culture   Final    NO GROWTH 5 DAYS Performed at [facility], [address], [place], [place] [address]    Report Status 04/08/2020 FINAL  Final  Culture, sputum-assessment     Status: None   Collection Time: 04/04/20  4:35 AM   Specimen: Expectorated Sputum  Result Value Ref Range Status   Specimen Description EXPECTORATED SPUTUM  Final   Special Requests NONE  Final   Sputum evaluation   Final    Sputum specimen not acceptable for testing.  Please recollect.   Yancey Flemings RN AT 6503 ON 04/04/20 [facility] Performed at [facility], [address] [place], [place] [address]    Report Status 04/04/2020 FINAL  Final     Radiology Studies: No results found. Scheduled Meds: . amLODipine  10 mg Oral Daily  . aspirin EC  81 mg Oral Daily  . atorvastatin  20 mg Oral QPM  . baricitinib  2 mg Oral Daily  . carvedilol  6.25 mg Oral BID  WC  . feeding supplement (ENSURE ENLIVE)  237 mL Oral TID BM  . heparin  5,000 Units Subcutaneous Q8H  .  methylPREDNISolone (SOLU-MEDROL) injection  0.5 mg/kg Intravenous Q12H  . pneumococcal 23 valent vaccine  0.5 mL Intramuscular Tomorrow-1000  . zinc sulfate  220 mg Oral Daily   Continuous Infusions:    LOS: 6 days    Total time spent: 30 minutes Darliss [name], MD [facility] If 7PM-7AM, please contact night-coverage [email or web address] 04/10/2020, 11:28 AM

## 2020-04-10 NOTE — Plan of Care (Signed)
  Problem: Education: Goal: Knowledge of risk factors and measures for prevention of condition will improve Outcome: Progressing   Problem: Coping: Goal: Psychosocial and spiritual needs will be supported Outcome: Progressing   Problem: Respiratory: Goal: Will maintain a patent airway Outcome: Progressing Goal: Complications related to the disease process, condition or treatment will be avoided or minimized Outcome: Progressing   

## 2020-04-10 NOTE — Progress Notes (Signed)
Did deep breathing with the patient, was able to expectorate some sputum which was collected and sent to lab for cytology.

## 2020-04-11 ENCOUNTER — Inpatient Hospital Stay: Payer: Medicare Other

## 2020-04-11 LAB — CBC WITH DIFFERENTIAL/PLATELET
Abs Immature Granulocytes: 0.08 10*3/uL — ABNORMAL HIGH (ref 0.00–0.07)
Basophils Absolute: 0 10*3/uL (ref 0.0–0.1)
Basophils Relative: 0 %
Eosinophils Absolute: 0 10*3/uL (ref 0.0–0.5)
Eosinophils Relative: 0 %
HCT: 44.8 % (ref 39.0–52.0)
Hemoglobin: 14.9 g/dL (ref 13.0–17.0)
Immature Granulocytes: 1 %
Lymphocytes Relative: 7 %
Lymphs Abs: 0.5 10*3/uL — ABNORMAL LOW (ref 0.7–4.0)
MCH: 31.4 pg (ref 26.0–34.0)
MCHC: 33.3 g/dL (ref 30.0–36.0)
MCV: 94.5 fL (ref 80.0–100.0)
Monocytes Absolute: 0.4 10*3/uL (ref 0.1–1.0)
Monocytes Relative: 7 %
Neutro Abs: 5.3 10*3/uL (ref 1.7–7.7)
Neutrophils Relative %: 85 %
Platelets: 168 10*3/uL (ref 150–400)
RBC: 4.74 MIL/uL (ref 4.22–5.81)
RDW: 11.9 % (ref 11.5–15.5)
WBC: 6.3 10*3/uL (ref 4.0–10.5)
nRBC: 0 % (ref 0.0–0.2)

## 2020-04-11 LAB — COMPREHENSIVE METABOLIC PANEL
ALT: 27 U/L (ref 0–44)
AST: 15 U/L (ref 15–41)
Albumin: 2.6 g/dL — ABNORMAL LOW (ref 3.5–5.0)
Alkaline Phosphatase: 43 U/L (ref 38–126)
Anion gap: 7 (ref 5–15)
BUN: 58 mg/dL — ABNORMAL HIGH (ref 8–23)
CO2: 28 mmol/L (ref 22–32)
Calcium: 8.6 mg/dL — ABNORMAL LOW (ref 8.9–10.3)
Chloride: 107 mmol/L (ref 98–111)
Creatinine, Ser: 1.66 mg/dL — ABNORMAL HIGH (ref 0.61–1.24)
GFR calc Af Amer: 48 mL/min — ABNORMAL LOW (ref >60)
GFR calc non Af Amer: 41 mL/min — ABNORMAL LOW (ref >60)
Glucose, Bld: 186 mg/dL — ABNORMAL HIGH (ref 70–99)
Potassium: 5.1 mmol/L (ref 3.5–5.1)
Sodium: 142 mmol/L (ref 135–145)
Total Bilirubin: 0.5 mg/dL (ref 0.3–1.2)
Total Protein: 6 g/dL — ABNORMAL LOW (ref 6.5–8.1)

## 2020-04-11 LAB — C-REACTIVE PROTEIN: CRP: 0.8 mg/dL (ref <1.0)

## 2020-04-11 LAB — HEMOGLOBIN A1C
Hgb A1c MFr Bld: 7.1 % — ABNORMAL HIGH (ref 4.8–5.6)
Mean Plasma Glucose: 157.07 mg/dL

## 2020-04-11 LAB — GLUCOSE, CAPILLARY
Glucose-Capillary: 176 mg/dL — ABNORMAL HIGH (ref 70–99)
Glucose-Capillary: 182 mg/dL — ABNORMAL HIGH (ref 70–99)
Glucose-Capillary: 231 mg/dL — ABNORMAL HIGH (ref 70–99)
Glucose-Capillary: 217 mg/dL — ABNORMAL HIGH (ref 70–99)

## 2020-04-11 LAB — FIBRIN DERIVATIVES D-DIMER (ARMC ONLY): Fibrin derivatives D-dimer (ARMC): 426.23 ng/mL (FEU) (ref 0.00–499.00)

## 2020-04-11 LAB — CYTOLOGY - NON PAP

## 2020-04-11 LAB — FERRITIN: Ferritin: 856 ng/mL — ABNORMAL HIGH (ref 24–336)

## 2020-04-11 LAB — LACTATE DEHYDROGENASE: LDH: 250 U/L — ABNORMAL HIGH (ref 98–192)

## 2020-04-11 MED ORDER — CARVEDILOL 3.125 MG PO TABS
3.1250 mg | ORAL_TABLET | Freq: Two times a day (BID) | ORAL | Status: DC
  Administered 2020-04-11 – 2020-04-13 (×4): 3.125 mg via ORAL
  Filled 2020-04-11 (×4): qty 1

## 2020-04-11 NOTE — Progress Notes (Signed)
Ambulated patient in room on 7L [place] and he went as low as 88% O2. Resting in chair at bedside on 4L [place] he is 94%.

## 2020-04-11 NOTE — Progress Notes (Signed)
PROGRESS NOTE    Martin Jennings  GHW:[id number] DOB: [birth year] DOA: 04/03/2020 PCP: System, Provider Not In   Brief Narrative: Martin Jennings is a 69 y.o. male with medical history significant of bladder cancer, hypertension, peripheral vascular disease, hyperlipidemia who apparently contracted COVID-19 infection with his wife 3 weeks ago from the daughter.   They have been symptomatic since then including the wife who is still homesick.  In the last few days he got progressively short of breath weak and tired.  Patient came to the ER where he was seen and evaluated.    Upon arrival to ED, Temperature 98.9 blood pressure 104/63 pulse 80 respiratory rate of 26 oxygen sats 94% on 2 L.  White count 3.4 hemoglobin 15.4 platelets 138.  Sodium 133 potassium 4.5 chloride 105 CO2 of 17 BUN 57 creatinine 3.70 and calcium 7.7 glucose 122.  Chest x-ray showed Space opacity consistent with pneumonia in the anterior segment of the right upper lobe.  Otherwise clear bilaterally.    Patient admitted to hospitalist service with acute hypoxic respiratory failure secondary to COVID-19 pneumonia.  He was started on remdesivir per pharmacy protocol as well as Solu-Medrol.  His oxygen demand increased.  He was then started on baricitinib on 04/09/2020.    Assessment & Plan:   Principal Problem:   CAP (community acquired pneumonia) Active Problems:   Essential hypertension   Hyperlipidemia   PVD (peripheral vascular disease) ([place])   COVID-19 virus infection   ARF (acute renal failure) ([place])  Acute hypoxic respiratory failure secondary to CAP/Right lobar pneumonia/COVID-19 pneumonia:  Feels better.  Oxyge requirement same as yesterday.  Continue following Remdesivir per pharmacy protocol-completed on 04/09/2020 Baricitinib-started on 04/09/2020 IV Solu-Medrol Supportive care, antitussives, bronchodilator, zinc and vitamin C. Patient was encouraged to prone, out of bed to chair, to use incentive spirometry and  flutter valve.   Hemoptysis/ RUL mass Stable CTA found lung mass c/w cancer.d/w family and pt and consulted pulm and oncology.Per oncology pt to have PET scan as outpatient once stable.SPutum cytology pending. Supplemental oxygen.Outpatient bronchoscopy per PCCM.   Thrombocytopenia/hemoptysis: Resolved.  Will continue aspirin.  Essential hypertension with intermittent bradycardia: Blood pressure fairly controlled.  Had some intermittent bradycardia overnight but currently heart rate of 90.  Reduce carvedilol to half to 3.125 mg twice daily and continue 10 mg of amlodipine.  CKD stage IIIb: Stable.  DVT prophylaxis: Subcutaneous heparin Code Status: Full Code Family Communication: None present at bedside.  Discussed with son yesterday.  Patient alert and oriented. Disposition Plan:    Status is: Inpatient Not inpatient appropriate, will call UM team and downgrade to OBS.   Dispo: The patient is from: Home              Anticipated d/c is to: Home              Anticipated d/c date is: > 3 days              Patient currently is not medically stable to d/c. Subjective: Seen and examined.  Continues to feel well.  No complaints.  Objective: Vitals:   04/10/20 2356 04/11/20 0559 04/11/20 0822 04/11/20 1215  BP: (!) 145/84 127/67 119/76 (!) 121/111  Pulse: 63 61 (!) 51 90  Resp: 18 16 20  (!) 21  Temp: (!) 97.5 F (36.4 C) 97.7 F (36.5 C) 97.7 F (36.5 C) 97.9 F (36.6 C)  TempSrc: Oral Oral Oral Oral  SpO2: 96% 96% 98% 99%  Weight:  Height:        Intake/Output Summary (Last 24 hours) at 04/11/2020 1403 Last data filed at 04/11/2020 1300 Gross per 24 hour  Intake 75 ml  Output 600 ml  Net -525 ml   Filed Weights   04/03/20 1257 04/04/20 2048  Weight: 84.8 kg 74.9 kg    Examination: Blood pressure (!) 121/111, pulse 90, temperature 97.9 F (36.6 C), temperature source Oral, resp. rate (!) 21, height 5\' 11"  (1.803 m), weight 74.9 kg, SpO2 99 %.  General exam:  Appears calm and comfortable  Respiratory system: Diminished breath sounds. Respiratory effort normal. Cardiovascular system: S1 & S2 heard, RRR. No JVD, murmurs, rubs, gallops or clicks. No pedal edema. Gastrointestinal system: Abdomen is nondistended, soft and nontender. No organomegaly or masses felt. Normal bowel sounds heard. Central nervous system: Alert and oriented. No focal neurological deficits. Extremities: Symmetric 5 x 5 power. Skin: No rashes, lesions or ulcers.  Psychiatry: Judgement and insight appear normal. Mood & affect appropriate.     Data Reviewed: I have personally reviewed following labs and imaging studies  I/O last 3 completed shifts: In: 240 [P.O.:240] Out: -  Total I/O In: 91 [P.O.:75] Out: 600 [Urine:600] Lab Results  Component Value Date   CREATININE 1.66 (H) 04/11/2020   CREATININE 1.59 (H) 04/10/2020   CREATININE 1.65 (H) 04/09/2020   CBC: Recent Labs  Lab 04/07/20 0439 04/08/20 0512 04/09/20 0513 04/10/20 0455 04/11/20 0518  WBC 5.6 6.6 8.2 3.6* 6.3  NEUTROABS 4.9 5.8 6.9 3.0 5.3  HGB 15.4 14.7 14.9 14.1 14.9  HCT 46.3 43.9 44.7 41.3 44.8  MCV 93.2 94.4 94.5 91.6 94.5  PLT 170 167 180 175 740   Basic Metabolic Panel: Recent Labs  Lab 04/05/20 0531 04/05/20 0805 04/07/20 0439 04/08/20 0512 04/09/20 0513 04/10/20 0455 04/11/20 0518  NA 142   < > 144 144 145 141 142  K 5.4*   < > 5.3* 4.7 4.5 5.0 5.1  CL 112*   < > 109 110 111 107 107  CO2 21*   < > 26 25 25 27 28   GLUCOSE 110*   < > 160* 187* 133* 193* 186*  BUN 57*   < > 57* 56* 52* 49* 58*  CREATININE 2.21*   < > 1.90* 1.73* 1.65* 1.59* 1.66*  CALCIUM 8.0*   < > 8.4* 8.1* 8.1* 8.2* 8.6*  MG 2.4  --   --   --   --   --   --    < > = values in this interval not displayed.   GFR: Estimated Creatinine Clearance: 44.5 mL/min (A) (by C-G formula based on SCr of 1.66 mg/dL (H)). Liver Function Tests: Recent Labs  Lab 04/07/20 0439 04/08/20 0512 04/09/20 0513 04/10/20 0455  04/11/20 0518  AST 33 23 20 17 15   ALT 41 32 29 26 27   ALKPHOS 50 43 44 42 43  BILITOT 0.5 0.4 0.5 0.6 0.5  PROT 7.1 6.4* 6.0* 5.8* 6.0*  ALBUMIN 2.8* 2.7* 2.6* 2.5* 2.6*   Anemia Panel: Recent Labs    04/10/20 0455 04/11/20 0518  FERRITIN 1,026* 856*   Sepsis Labs: No results for input(s): PROCALCITON, LATICACIDVEN in the last 168 hours.  Recent Results (from the past 240 hour(s))  Blood culture (routine x 2)     Status: None   Collection Time: 04/03/20 11:31 PM   Specimen: BLOOD  Result Value Ref Range Status   Specimen Description BLOOD LEFT ANTECUBITAL  Final   Special  Requests   Final    BOTTLES DRAWN AEROBIC AND ANAEROBIC Blood Culture adequate volume   Culture   Final    NO GROWTH 5 DAYS Performed at [facility], [address] [place], [place] [address]    Report Status 04/08/2020 FINAL  Final  SARS Coronavirus 2 by RT PCR (hospital order, performed in [facility] hospital lab) Nasopharyngeal Nasopharyngeal Swab     Status: Abnormal   Collection Time: 04/03/20 11:31 PM   Specimen: Nasopharyngeal Swab  Result Value Ref Range Status   SARS Coronavirus 2 POSITIVE (A) NEGATIVE Final    Comment: RESULT CALLED TO, READ BACK BY AND VERIFIED WITH: CHRISSY BRAND RN 0129 04/04/20 HNM (NOTE) SARS-CoV-2 target nucleic acids are DETECTED  SARS-CoV-2 RNA is generally detectable in upper respiratory specimens  during the acute phase of infection.  Positive results are indicative  of the presence of the identified virus, but do not rule out bacterial infection or co-infection with other pathogens not detected by the test.  Clinical correlation with patient history and  other diagnostic information is necessary to determine patient infection status.  The expected result is negative.  Fact Sheet for Patients:   StrictlyIdeas.no   Fact Sheet for Healthcare Providers:   BankingDealers.co.za    This test is not yet  approved or cleared by the [place] FDA and  has been authorized for detection and/or diagnosis of SARS-CoV-2 by FDA under an Emergency Use Authorization (EUA).  This EUA will remain in effect (meaning this tes t can be used) for the duration of  the COVID-19 declaration under Section 564(b)(1) of the Act, 21 U.S.C. section 360-bbb-3(b)(1), unless the authorization is terminated or revoked sooner.  Performed at [facility], [address] [place], [place] [address]   Blood culture (routine x 2)     Status: None   Collection Time: 04/03/20 11:32 PM   Specimen: BLOOD  Result Value Ref Range Status   Specimen Description BLOOD BLOOD LEFT HAND  Final   Special Requests   Final    BOTTLES DRAWN AEROBIC AND ANAEROBIC Blood Culture adequate volume   Culture   Final    NO GROWTH 5 DAYS Performed at [facility], [address], [place], [place] [address]    Report Status 04/08/2020 FINAL  Final  Culture, sputum-assessment     Status: None   Collection Time: 04/04/20  4:35 AM   Specimen: Expectorated Sputum  Result Value Ref Range Status   Specimen Description EXPECTORATED SPUTUM  Final   Special Requests NONE  Final   Sputum evaluation   Final    Sputum specimen not acceptable for testing.  Please recollect.   Yancey Flemings RN AT 4034 ON 04/04/20 [facility] Performed at [facility], [address], [place], [place] [address]    Report Status 04/04/2020 FINAL  Final     Radiology Studies: No results found. Scheduled Meds:  amLODipine  10 mg Oral Daily   aspirin EC  81 mg Oral Daily   atorvastatin  20 mg Oral QPM   baricitinib  2 mg Oral Daily   carvedilol  3.125 mg Oral BID WC   feeding supplement (ENSURE ENLIVE)  237 mL Oral TID BM   heparin  5,000 Units Subcutaneous Q8H   insulin aspart  0-5 Units Subcutaneous QHS   insulin aspart  0-9 Units Subcutaneous TID WC   methylPREDNISolone (SOLU-MEDROL) injection  0.5 mg/kg Intravenous  Q12H   pneumococcal 23 valent vaccine  0.5 mL Intramuscular Tomorrow-1000   zinc sulfate  220 mg Oral Daily   Continuous Infusions:    LOS: 7 days    Total time spent: 28 minutes Darliss [name], MD [facility] If 7PM-7AM, please contact night-coverage [email or web address] 04/11/2020, 2:03 PM

## 2020-04-11 NOTE — Care Management Important Message (Signed)
Important Message  Patient Details  Name: Martin Jennings MRN: [id number] Date of Birth: October 13, 1950   Medicare Important Message Given:  Yes  Reviewed with Barbie Haggis, daughter.   Copy of Medicare IM sent previously to home address.     Dannette Barbara 04/11/2020, 2:32 PM

## 2020-04-11 NOTE — Plan of Care (Signed)
  Problem: Education: Goal: Knowledge of risk factors and measures for prevention of condition will improve Outcome: Progressing   Problem: Coping: Goal: Psychosocial and spiritual needs will be supported Outcome: Progressing   Problem: Respiratory: Goal: Will maintain a patent airway Outcome: Progressing Goal: Complications related to the disease process, condition or treatment will be avoided or minimized Outcome: Progressing   

## 2020-04-11 NOTE — Progress Notes (Addendum)
Dr. Doristine Bosworth messaged that pt's heart rate has been dipping down in the 40's and 50's. Asked if they wanted me to hold carvedilol. Received order to give half dose. Will continue to monitor closely today.

## 2020-04-12 LAB — GLUCOSE, CAPILLARY
Glucose-Capillary: 196 mg/dL — ABNORMAL HIGH (ref 70–99)
Glucose-Capillary: 176 mg/dL — ABNORMAL HIGH (ref 70–99)
Glucose-Capillary: 163 mg/dL — ABNORMAL HIGH (ref 70–99)
Glucose-Capillary: 195 mg/dL — ABNORMAL HIGH (ref 70–99)

## 2020-04-12 MED ORDER — INSULIN GLARGINE 100 UNIT/ML ~~LOC~~ SOLN
10.0000 [IU] | Freq: Once | SUBCUTANEOUS | Status: AC
  Administered 2020-04-12: 10 [IU] via SUBCUTANEOUS
  Filled 2020-04-12: qty 0.1

## 2020-04-12 MED ORDER — INSULIN GLARGINE 100 UNIT/ML ~~LOC~~ SOLN
25.0000 [IU] | Freq: Every day | SUBCUTANEOUS | Status: DC
  Administered 2020-04-13: 25 [IU] via SUBCUTANEOUS
  Filled 2020-04-12 (×2): qty 0.25

## 2020-04-12 MED ORDER — INSULIN GLARGINE 100 UNIT/ML ~~LOC~~ SOLN
15.0000 [IU] | Freq: Every day | SUBCUTANEOUS | Status: DC
  Administered 2020-04-12: 15 [IU] via SUBCUTANEOUS
  Filled 2020-04-12: qty 0.15

## 2020-04-12 NOTE — Progress Notes (Signed)
PROGRESS NOTE    Martin Jennings  KPT:[id number] DOB: 1951/06/24 DOA: 04/03/2020 PCP: System, Provider Not In   Brief Narrative: Martin Jennings is a 69 y.o. male with medical history significant of bladder cancer, hypertension, peripheral vascular disease, hyperlipidemia who apparently contracted COVID-19 infection with his wife 3 weeks ago from the daughter.   They have been symptomatic since then including the wife who is still homesick.  In the last few days he got progressively short of breath weak and tired.  Patient came to the ER where he was seen and evaluated.    Upon arrival to ED, Temperature 98.9 blood pressure 104/63 pulse 80 respiratory rate of 26 oxygen sats 94% on 2 L.  White count 3.4 hemoglobin 15.4 platelets 138.  Sodium 133 potassium 4.5 chloride 105 CO2 of 17 BUN 57 creatinine 3.70 and calcium 7.7 glucose 122.  Chest x-ray showed Space opacity consistent with pneumonia in the anterior segment of the right upper lobe.  Otherwise clear bilaterally.    Patient admitted to hospitalist service with acute hypoxic respiratory failure secondary to COVID-19 pneumonia.  He was started on remdesivir per pharmacy protocol as well as Solu-Medrol.  His oxygen demand increased.  He was then started on baricitinib on 04/09/2020.    Assessment & Plan:   Principal Problem:   CAP (community acquired pneumonia) Active Problems:   Essential hypertension   Hyperlipidemia   PVD (peripheral vascular disease) ([place])   COVID-19 virus infection   ARF (acute renal failure) ([place])  Acute hypoxic respiratory failure secondary to CAP/Right lobar pneumonia/COVID-19 pneumonia:  Feels better.  Oxyge requirement improved yesterday in the evening to 4 L but now back to 7 L high flow nasal oxygen.  Continues to feel well and wants to go home as soon as possible.  He is hanging on in the same room with his wife who is also being treated for same condition. Remdesivir per pharmacy protocol-completed on  04/09/2020 Baricitinib-started on 04/09/2020 IV Solu-Medrol Supportive care, antitussives, bronchodilator, zinc and vitamin C. Patient was encouraged to prone, out of bed to chair, to use incentive spirometry and flutter valve.  Type 2 diabetes mellitus: New diagnosis.  Hemoglobin A1c 7.1.  Still hyperglycemic, likely due to steroids.  Received 15 units of Lantus already this morning.  We will give him 10 more units and starting tomorrow he will be on 15 units.  We will continue SSI.    Hemoptysis/ RUL mass Stable CTA found lung mass c/w cancer.d/w family and pt and consulted pulm and oncology.Per oncology pt to have PET scan as outpatient once stable.SPutum cytology nondiagnostic. Supplemental oxygen.Outpatient bronchoscopy per PCCM.   Thrombocytopenia/hemoptysis: Resolved.  Will continue aspirin.  Essential hypertension with intermittent bradycardia: Blood pressure fairly controlled.  Had some intermittent bradycardia overnight again but currently heart rate of 90.  Continue reduced dose of carvedilol 3.125 mg twice daily.  Continue amlodipine.  CKD stage IIIb: Stable.  Possible OSA: We will need outpatient sleep study.  DVT prophylaxis: Subcutaneous heparin Code Status: Full Code Family Communication: None present at bedside.  Discussed with son yesterday.  Patient alert and oriented. Disposition Plan:    Status is: Inpatient Not inpatient appropriate, will call UM team and downgrade to OBS.   Dispo: The patient is from: Home              Anticipated d/c is to: Home              Anticipated d/c date is:1-2 days.  Will order PT OT.              Patient currently is not medically stable to d/c. Subjective: Seen and examined.  Sitting at the edge of the bed.  Feeling better than yesterday.  Denies shortness of breath or any other complaint.  Objective: Vitals:   04/11/20 1215 04/11/20 1611 04/11/20 2100 04/12/20 0543  BP: (!) 121/111 119/66 131/70 116/67  Pulse: 90 (!) 56 (!) 56  (!) 51  Resp: (!) 21 20 16    Temp: 97.9 F (36.6 C) 98.2 F (36.8 C) 98 F (36.7 C) 98.3 F (36.8 C)  TempSrc: Oral Oral Oral Oral  SpO2: 99% 94% 95% (!) 87%  Weight:      Height:        Intake/Output Summary (Last 24 hours) at 04/12/2020 0742 Last data filed at 04/11/2020 1600 Gross per 24 hour  Intake 75 ml  Output 900 ml  Net -825 ml   Filed Weights   04/03/20 1257 04/04/20 2048  Weight: 84.8 kg 74.9 kg    Examination: Blood pressure 116/67, pulse (!) 51, temperature 98.3 F (36.8 C), temperature source Oral, resp. rate 16, height 5\' 11"  (1.803 m), weight 74.9 kg, SpO2 (!) 87 %.  General exam: Appears calm and comfortable  Respiratory system: Diminished breath sounds. Respiratory effort normal. Cardiovascular system: S1 & S2 heard, RRR. No JVD, murmurs, rubs, gallops or clicks. No pedal edema. Gastrointestinal system: Abdomen is nondistended, soft and nontender. No organomegaly or masses felt. Normal bowel sounds heard. Central nervous system: Alert and oriented. No focal neurological deficits. Extremities: Symmetric 5 x 5 power. Skin: No rashes, lesions or ulcers.  Psychiatry: Judgement and insight appear normal. Mood & affect appropriate.    Data Reviewed: I have personally reviewed following labs and imaging studies  I/O last 3 completed shifts: In: 78 [P.O.:75] Out: 900 [Urine:900] No intake/output data recorded. Lab Results  Component Value Date   CREATININE 1.66 (H) 04/11/2020   CREATININE 1.59 (H) 04/10/2020   CREATININE 1.65 (H) 04/09/2020   CBC: Recent Labs  Lab 04/07/20 0439 04/08/20 0512 04/09/20 0513 04/10/20 0455 04/11/20 0518  WBC 5.6 6.6 8.2 3.6* 6.3  NEUTROABS 4.9 5.8 6.9 3.0 5.3  HGB 15.4 14.7 14.9 14.1 14.9  HCT 46.3 43.9 44.7 41.3 44.8  MCV 93.2 94.4 94.5 91.6 94.5  PLT 170 167 180 175 226   Basic Metabolic Panel: Recent Labs  Lab 04/07/20 0439 04/08/20 0512 04/09/20 0513 04/10/20 0455 04/11/20 0518  NA 144 144 145 141 142   K 5.3* 4.7 4.5 5.0 5.1  CL 109 110 111 107 107  CO2 26 25 25 27 28   GLUCOSE 160* 187* 133* 193* 186*  BUN 57* 56* 52* 49* 58*  CREATININE 1.90* 1.73* 1.65* 1.59* 1.66*  CALCIUM 8.4* 8.1* 8.1* 8.2* 8.6*   GFR: Estimated Creatinine Clearance: 44.5 mL/min (A) (by C-G formula based on SCr of 1.66 mg/dL (H)). Liver Function Tests: Recent Labs  Lab 04/07/20 0439 04/08/20 0512 04/09/20 0513 04/10/20 0455 04/11/20 0518  AST 33 23 20 17 15   ALT 41 32 29 26 27   ALKPHOS 50 43 44 42 43  BILITOT 0.5 0.4 0.5 0.6 0.5  PROT 7.1 6.4* 6.0* 5.8* 6.0*  ALBUMIN 2.8* 2.7* 2.6* 2.5* 2.6*   Anemia Panel: Recent Labs    04/10/20 0455 04/11/20 0518  FERRITIN 1,026* 856*   Sepsis Labs: No results for input(s): PROCALCITON, LATICACIDVEN in the last 168 hours.  Recent Results (from the  past 240 hour(s))  Blood culture (routine x 2)     Status: None   Collection Time: 04/03/20 11:31 PM   Specimen: BLOOD  Result Value Ref Range Status   Specimen Description BLOOD LEFT ANTECUBITAL  Final   Special Requests   Final    BOTTLES DRAWN AEROBIC AND ANAEROBIC Blood Culture adequate volume   Culture   Final    NO GROWTH 5 DAYS Performed at [facility], [address] [place], [place] [address]    Report Status 04/08/2020 FINAL  Final  SARS Coronavirus 2 by RT PCR (hospital order, performed in [place] hospital lab) Nasopharyngeal Nasopharyngeal Swab     Status: Abnormal   Collection Time: 04/03/20 11:31 PM   Specimen: Nasopharyngeal Swab  Result Value Ref Range Status   SARS Coronavirus 2 POSITIVE (A) NEGATIVE Final    Comment: RESULT CALLED TO, READ BACK BY AND VERIFIED WITH: CHRISSY BRAND RN 0129 04/04/20 HNM (NOTE) SARS-CoV-2 target nucleic acids are DETECTED  SARS-CoV-2 RNA is generally detectable in upper respiratory specimens  during the acute phase of infection.  Positive results are indicative  of the presence of the identified virus, but do not rule out bacterial  infection or co-infection with other pathogens not detected by the test.  Clinical correlation with patient history and  other diagnostic information is necessary to determine patient infection status.  The expected result is negative.  Fact Sheet for Patients:   StrictlyIdeas.no   Fact Sheet for Healthcare Providers:   BankingDealers.co.za    This test is not yet approved or cleared by the [place] FDA and  has been authorized for detection and/or diagnosis of SARS-CoV-2 by FDA under an Emergency Use Authorization (EUA).  This EUA will remain in effect (meaning this tes t can be used) for the duration of  the COVID-19 declaration under Section 564(b)(1) of the Act, 21 U.S.C. section 360-bbb-3(b)(1), unless the authorization is terminated or revoked sooner.  Performed at [facility], [address] [place], [place] [address]   Blood culture (routine x 2)     Status: None   Collection Time: 04/03/20 11:32 PM   Specimen: BLOOD  Result Value Ref Range Status   Specimen Description BLOOD BLOOD LEFT HAND  Final   Special Requests   Final    BOTTLES DRAWN AEROBIC AND ANAEROBIC Blood Culture adequate volume   Culture   Final    NO GROWTH 5 DAYS Performed at [facility], [address] [place], [place] [address]    Report Status 04/08/2020 FINAL  Final  Culture, sputum-assessment     Status: None   Collection Time: 04/04/20  4:35 AM   Specimen: Expectorated Sputum  Result Value Ref Range Status   Specimen Description EXPECTORATED SPUTUM  Final   Special Requests NONE  Final   Sputum evaluation   Final    Sputum specimen not acceptable for testing.  Please recollect.   Yancey Flemings RN AT 3154 ON 04/04/20 [facility] Performed at [facility], [address], [place], [place] [address]    Report Status 04/04/2020 FINAL  Final     Radiology Studies: [facility] Chest Port 1 View  Result Date:  04/11/2020 CLINICAL DATA:  COVID-19 patient.  Patient feeling better today. EXAM: PORTABLE CHEST 1 VIEW COMPARISON:  April 03, 2020 FINDINGS: Masslike opacity in the right upper lobe may be a little smaller in the interval. The findings were considered concerning for primary bronchogenic carcinoma on the recent  CT scan. Bibasilar opacities are increased since April 03, 2020. The heart, hila, mediastinum, lungs, and pleura are otherwise unremarkable. IMPRESSION: 1. Persistent masslike opacity in the right upper lobe, stable to slightly improved in the interval. The possibility of a primary bronchogenic carcinoma was raised on the CT scan from April 05, 2020. 2. Increasing bibasilar pulmonary infiltrates worrisome for sequela of COVID-19 given history. Electronically Signed   By: Dorise Bullion III M.D   On: 04/11/2020 16:57   Scheduled Meds: . amLODipine  10 mg Oral Daily  . aspirin EC  81 mg Oral Daily  . atorvastatin  20 mg Oral QPM  . baricitinib  2 mg Oral Daily  . carvedilol  3.125 mg Oral BID WC  . feeding supplement (ENSURE ENLIVE)  237 mL Oral TID BM  . heparin  5,000 Units Subcutaneous Q8H  . insulin aspart  0-5 Units Subcutaneous QHS  . insulin aspart  0-9 Units Subcutaneous TID WC  . insulin glargine  15 Units Subcutaneous Daily  . methylPREDNISolone (SOLU-MEDROL) injection  0.5 mg/kg Intravenous Q12H  . pneumococcal 23 valent vaccine  0.5 mL Intramuscular Tomorrow-1000  . zinc sulfate  220 mg Oral Daily   Continuous Infusions:    LOS: 8 days    Total time spent: 29 minutes Darliss [name], MD [facility] If 7PM-7AM, please contact night-coverage [email or web address] 04/12/2020, 7:42 AM

## 2020-04-12 NOTE — Plan of Care (Signed)
  Problem: Education: Goal: Knowledge of risk factors and measures for prevention of condition will improve Outcome: Progressing   Problem: Coping: Goal: Psychosocial and spiritual needs will be supported Outcome: Progressing   Problem: Respiratory: Goal: Will maintain a patent airway Outcome: Progressing Goal: Complications related to the disease process, condition or treatment will be avoided or minimized Outcome: Progressing   

## 2020-04-12 NOTE — Progress Notes (Signed)
[place] attempted to visit pt. per RN suggestion; pt. asleep at this time.  [place] may attempt follow-up visit next week.

## 2020-04-12 NOTE — Evaluation (Signed)
Physical Therapy Evaluation Patient Details Name: Martin Jennings MRN: [id number] DOB: 03-Aug-1950 Today's Date: 04/12/2020   History of Present Illness  Martin Jennings is a 69 y.o. male with medical history significant of bladder cancer, hypertension, peripheral vascular disease, hyperlipidemia who apparently contracted COVID-19 infection with his wife 3 weeks ago from the daughter.   They have been symptomatic since then including the wife who is still homesick.  In the last few days he got progressively short of breath weak and tired.  Patient came to the ER where he was seen and evaluated. Chest x-ray showed Space opacity consistent with pneumonia in the anterior segment of the right upper lobe.  Otherwise clear bilaterally.    Patient admitted to hospitalist service with acute hypoxic respiratory failure secondary toCAP/Right lobar pneumonia/COVID-19 pneumonia .  He was started on remdesivir per pharmacy protocol as well as Solu-Medrol.  His oxygen demand increased.  He was then started on baricitinib on 04/09/2020. Also has hemotptysis/R UL mass. Stable CTA found lung mass c/w cancer.d/w family and pt and consulted pulm and oncology.Per oncology pt to have PET scan as outpatient once stable.SPutum cytology nondiagnostic. Supplemental oxygen.Outpatient bronchoscopy per PCCM. Acut on chronic kidney disease. New Dx DMII. [name] room with wife also being treated for COVID19    Clinical Impression  Patient alert and oriented and able to provide detailed history. Wife in room sleeping and did not contribute to history. Patient lives with his wife and daughter in a single story home with 5 steps to enter and one handrail. Daughter works full time and wife is currently hospitalized with him for COVID 19. Patient reports he was fully independent with mobility, ADLs, and IADLs including driving and did not need an assistive device prior to hospitalization. Upon PT eval, patient required 4L/min O2 delivered via [place]  for all mobility. Trial of removing O2 attempted during sitting at start of session but his SpO2 dropped below 88% on RA. Also experienced a drop in SpO2 as low as 83% briefly during end of ambulation and it recovered with seated rest and PLB. Pt unable to bring it up at end of ambulation while standing with PLB. Patient did not complete stairs but demonstrated 10 standing high knee marches followed by 10 consecutive sit <> stand with good tolerance and no loss of balance, strongly suggesting he will be able to navigate his steps at home to enter. Seems most limited by cardiopulmonary function. Did not require assistive device. Educated pt on pacing techniques to improve function. Patient appears to have experienced a decrease in functional activity tolerance but does appear to be able to return from a mobility standpoint. Would benefit from continued PT to work on improving activity tolerance. Patient would benefit from skilled physical therapy to address impairments and functional limitations (see PT Problem List below) to work towards stated goals and return to PLOF or maximal functional independence.       Follow Up Recommendations Home health PT    Equipment Recommendations  3in1 (PT) (for energy conservation while showering)    Recommendations for Other Services       Precautions / Restrictions Precautions Precautions: Fall Precaution Comments: reports no falls in the last 6 months Restrictions Weight Bearing Restrictions: No      Mobility  Bed Mobility Overal bed mobility: Independent             General bed mobility comments: supine <>  sit I, head of bed flat.  Transfers Overall transfer level:  Independent Equipment used: None             General transfer comment: sit <> stand from/to edge of bed I  Ambulation/Gait   Gait Distance (Feet): 120 Feet Assistive device: None Gait Pattern/deviations: WFL(Within Functional Limits) Gait velocity: Slower but WNL for  room ambulation   General Gait Details: patient ambulated back and forth within room ~ 120 feet with supervision and assistance only with line management. Stated he felt fine. SpO2 dropped as low as 84% at one point but improved with PLB. Patient sat down and continued to PLB when he was unable to bring up SpO2 while standing. Returend to low 90s with seated PLB.  Stairs Stairs:  (No stairs in room; completed 10 high marches in place no UE, and sit <> stand x10 with good tolerance and balance. SpO2 stayed in 90s with 4L/min)          Wheelchair Mobility    Modified Rankin (Stroke Patients Only)       Balance Overall balance assessment: Mild deficits observed, not formally tested Sitting-balance support: No upper extremity supported Sitting balance-Leahy Scale: Normal     Standing balance support: No upper extremity supported Standing balance-Leahy Scale: Good Standing balance comment: able to ambulate, high march, and complete sit <> stand with no loss of balance                             Pertinent Vitals/Pain Pain Assessment: No/denies pain    Home Living Family/patient expects to be discharged to:: Private residence Living Arrangements: Spouse/significant other;Children (daughter and wife) Available Help at Discharge: Family;Available PRN/intermittently (lives with wife and daughter. daughter works full time. Wife is here in the hospital with covid 19 with patient) Type of Home: House Home Access: Stairs to enter Entrance Stairs-Rails: Right Entrance Stairs-Number of Steps: 5 Home Layout: One level Home Equipment: Grab bars - toilet;Cane - single point;Grab bars - tub/shower Additional Comments: 2 canes,    Prior Function     Gait / Transfers Assistance Needed: no limitations with ambulation with no assistive device  ADL's / Homemaking Assistance Needed: I with all ADLs, and IADLs including driving        Hand Dominance   Dominant Hand: Left     Extremity/Trunk Assessment   Upper Extremity Assessment Upper Extremity Assessment: Overall WFL for tasks assessed    Lower Extremity Assessment Lower Extremity Assessment: Generalized weakness (grossly 4/5 B LE, slight lack of knee extension B)    Cervical / Trunk Assessment Cervical / Trunk Assessment: Normal  Communication   Communication: No difficulties  Cognition Arousal/Alertness: Awake/alert Behavior During Therapy: WFL for tasks assessed/performed Overall Cognitive Status: Within Functional Limits for tasks assessed                                        General Comments General comments (skin integrity, edema, etc.): Patient reports no falls in last 6 months. Completes ambulation and standing exercises without loss of balance. 4L/min O2 applied throughout session except at start where attempted to remove and SpO2 dropped below 88%. Re-applied O2 and patient was able to complete all other exercises/mobility except prolonged ambulation with SpO2 in 90s. Did drop to 84% with ambulation and recovered with seated rest and PLB.    Exercises Other Exercises Other Exercises: educated pt on role of PT in  acute care setting, discharge reccomendations, pacing. Other Exercises: 10x sit to stand from bed no AD, standing marching x 10 each side no AD   Assessment/Plan    PT Assessment Patient needs continued PT services  PT Problem List Decreased strength;Decreased activity tolerance;Decreased mobility;Cardiopulmonary status limiting activity;Decreased balance       PT Treatment Interventions DME instruction;Balance training;Gait training;Neuromuscular re-education;Functional mobility training;Therapeutic activities;Therapeutic exercise;Stair training;Patient/family education    PT Goals (Current goals can be found in the Care Plan section)  Acute Rehab PT Goals Patient Stated Goal: return home PT Goal Formulation: With patient Time For Goal Achievement:  04/26/20 Potential to Achieve Goals: Good    Frequency Min 2X/week   Barriers to discharge   appears to need supplemental O2 for mobility    Co-evaluation               AM-PAC PT "6 Clicks" Mobility  Outcome Measure Help needed turning from your back to your side while in a flat bed without using bedrails?: None Help needed moving from lying on your back to sitting on the side of a flat bed without using bedrails?: None Help needed moving to and from a bed to a chair (including a wheelchair)?: None Help needed standing up from a chair using your arms (e.g., wheelchair or bedside chair)?: None Help needed to walk in hospital room?: None Help needed climbing 3-5 steps with a railing? : A Little 6 Click Score: 23    End of Session Equipment Utilized During Treatment: Oxygen (4L/min via [place]) Activity Tolerance: Patient tolerated treatment well Patient left: in bed;with call bell/phone within reach Nurse Communication: Mobility status PT Visit Diagnosis: Muscle weakness (generalized) (M62.81);Difficulty in walking, not elsewhere classified (R26.2)    Time: 7741-4239 PT Time Calculation (min) (ACUTE ONLY): 33 min   Charges:   PT Evaluation $PT Eval Low Complexity: 1 Low PT Treatments $Therapeutic Activity: 8-22 mins        Everlean Alstrom. Graylon Good, PT, DPT 04/12/20, 3:55 PM

## 2020-04-13 LAB — CBC WITH DIFFERENTIAL/PLATELET
Abs Immature Granulocytes: 0.11 10*3/uL — ABNORMAL HIGH (ref 0.00–0.07)
Basophils Absolute: 0 10*3/uL (ref 0.0–0.1)
Basophils Relative: 0 %
Eosinophils Absolute: 0 10*3/uL (ref 0.0–0.5)
Eosinophils Relative: 0 %
HCT: 43 % (ref 39.0–52.0)
Hemoglobin: 14.4 g/dL (ref 13.0–17.0)
Immature Granulocytes: 2 %
Lymphocytes Relative: 5 %
Lymphs Abs: 0.4 10*3/uL — ABNORMAL LOW (ref 0.7–4.0)
MCH: 30.6 pg (ref 26.0–34.0)
MCHC: 33.5 g/dL (ref 30.0–36.0)
MCV: 91.5 fL (ref 80.0–100.0)
Monocytes Absolute: 0.3 10*3/uL (ref 0.1–1.0)
Monocytes Relative: 5 %
Neutro Abs: 6.4 10*3/uL (ref 1.7–7.7)
Neutrophils Relative %: 88 %
Platelets: 168 10*3/uL (ref 150–400)
RBC: 4.7 MIL/uL (ref 4.22–5.81)
RDW: 11.5 % (ref 11.5–15.5)
WBC: 7.2 10*3/uL (ref 4.0–10.5)
nRBC: 0 % (ref 0.0–0.2)

## 2020-04-13 LAB — COMPREHENSIVE METABOLIC PANEL
ALT: 28 U/L (ref 0–44)
AST: 16 U/L (ref 15–41)
Albumin: 2.6 g/dL — ABNORMAL LOW (ref 3.5–5.0)
Alkaline Phosphatase: 49 U/L (ref 38–126)
Anion gap: 7 (ref 5–15)
BUN: 68 mg/dL — ABNORMAL HIGH (ref 8–23)
CO2: 29 mmol/L (ref 22–32)
Calcium: 8.4 mg/dL — ABNORMAL LOW (ref 8.9–10.3)
Chloride: 104 mmol/L (ref 98–111)
Creatinine, Ser: 1.88 mg/dL — ABNORMAL HIGH (ref 0.61–1.24)
GFR calc Af Amer: 41 mL/min — ABNORMAL LOW (ref >60)
GFR calc non Af Amer: 36 mL/min — ABNORMAL LOW (ref >60)
Glucose, Bld: 220 mg/dL — ABNORMAL HIGH (ref 70–99)
Potassium: 4.9 mmol/L (ref 3.5–5.1)
Sodium: 140 mmol/L (ref 135–145)
Total Bilirubin: 0.5 mg/dL (ref 0.3–1.2)
Total Protein: 5.7 g/dL — ABNORMAL LOW (ref 6.5–8.1)

## 2020-04-13 LAB — GLUCOSE, CAPILLARY
Glucose-Capillary: 171 mg/dL — ABNORMAL HIGH (ref 70–99)
Glucose-Capillary: 265 mg/dL — ABNORMAL HIGH (ref 70–99)

## 2020-04-13 MED ORDER — PREDNISONE 10 MG PO TABS: ORAL_TABLET | ORAL | 0 refills | Status: DC

## 2020-04-13 NOTE — Discharge Instructions (Signed)
COVID-19: Quarantine vs. Isolation QUARANTINE keeps someone who was in close contact with someone who has COVID-19 away from others. If you had close contact with a person who has COVID-19  Stay home until 14 days after your last contact.  Check your temperature twice a day and watch for symptoms of COVID-19.  If possible, stay away from people who are at higher-risk for getting very sick from COVID-19. ISOLATION keeps someone who is sick or tested positive for COVID-19 without symptoms away from others, even in their own home. If you are sick and think or know you have COVID-19  Stay home until after ? At least 10 days since symptoms first appeared and ? At least 24 hours with no fever without fever-reducing medication and ? Symptoms have improved If you tested positive for COVID-19 but do not have symptoms  Stay home until after ? 10 days have passed since your positive test If you live with others, stay in a specific "sick room" or area and away from other people or animals, including pets. Use a separate bathroom, if available. cdc.gov/coronavirus 02/12/2019 This information is not intended to replace advice given to you by your health care provider. Make sure you discuss any questions you have with your health care provider. Document Revised: 06/28/2019 Document Reviewed: 06/28/2019 Elsevier Patient Education  2020 [employer]  

## 2020-04-13 NOTE — Discharge Summary (Signed)
Physician Discharge Summary  Martin Jennings JJK:[id number] DOB: [birth year] DOA: 04/03/2020  PCP: System, Provider Not In  Admit date: 04/03/2020 Discharge date: 04/13/2020  Admitted From: Home Disposition: Home  Recommendations for Outpatient Follow-up:  1. Follow up with PCP in 1-2 weeks 2. Follow-up with oncology in 1 to 2 weeks for PET scan. 3. Please obtain BMP/CBC in one week 4. Please follow up with your PCP on the following pending results: Unresulted Labs (From admission, onward)          Start     Ordered   04/11/20 0500  CBC with Differential/Platelet  Every 48 hours,   TIMED      04/10/20 1135           Home Jennings: Yes Equipment/Devices: Oxygen  Discharge Condition: Stable CODE STATUS: Full code Diet recommendation: Cardiac  Subjective: Seen and examined.  Feels much better.  No complaints.  Wants to go home.  Excited about that.  Brief/Interim Summary: Martin Buntrock Torainis a 69 y.o.malewith medical history significant ofbladder cancer, hypertension, peripheral vascular disease, hyperlipidemia who apparently contracted COVID-19 infection with his wife 3 weeks ago from the daughter. Martin Jennings been symptomatic since then including the wife. In the last few days he got progressively short of breath weak and tired. Patient came to the ER.  Upon arrival to ED, Temperature 98.9 blood pressure 104/63 pulse 80 respiratory rate of 26 oxygen sats 94% on 2 L. White count 3.4 hemoglobin 15.4 platelets 138. Sodium 133 potassium 4.5 chloride 105 CO2 of 17 BUN 57 creatinine 3.70 and calcium 7.7 glucose 122. Chest x-ray showed airopacity opacity consistent with pneumonia in the anterior segment of the right upper lobe. Otherwise clear bilaterally.   Patient admitted to hospitalist service with acute hypoxic respiratory failure secondary to COVID-19 pneumonia.  He was started on remdesivir per pharmacy protocol as well as Solu-Medrol.  His oxygen demand increased.  He was then  started on baricitinib on 04/09/2020.  He completed his remdesivir on 04/09/2020.  Patient started having hemoptysis.  For this, CT angiogram of the chest was done and he was found to have right-sided lung mass which was highly suspected of cancer.  Pulmonology and oncology were consulted.  Sputum cytology was nondiagnostic.  Oncology recommended outpatient follow-up with PET scan.  He also had thrombocytopenia which resolved.  Aspirin was held initially which was resumed.  Patient was on 6.125 mg twice daily carvedilol.  Patient had bradycardia for which, dose was reduced to half.  He continued to have bradycardia but asymptomatic.  Patient has improved significantly.  Currently requiring only 2 L oxygen with activity and none at rest.  Patient is being discharged in stable condition with tapering dose of prednisone.  Due to bradycardia, his carvedilol is being discontinued.  He will follow with PCP and oncology.   Discharge Diagnoses:  Principal Problem:   CAP (community acquired pneumonia) Active Problems:   Essential hypertension   Hyperlipidemia   PVD (peripheral vascular disease) ([place])   COVID-19 virus infection   ARF (acute renal failure) Martin Jennings)    Discharge Instructions  Discharge Instructions    Discharge patient   Complete by: As directed    Discharge disposition: 06-Home-[facility]ennings [facility]   Discharge patient date: 04/13/2020     Allergies as of 04/13/2020   No Known Allergies     Medication List    STOP taking these medications   carvedilol 6.25 MG tablet Commonly known as: COREG     TAKE these medications  amLODipine 10 MG tablet Commonly known as: NORVASC Take 10 mg by mouth daily.   aspirin EC 81 MG tablet Take 81 mg by mouth daily.   atorvastatin 20 MG tablet Commonly known as: LIPITOR Take 20 mg by mouth every evening.   clopidogrel 75 MG tablet Commonly known as: PLAVIX Take 75 mg by mouth daily.   predniSONE 10 MG tablet Commonly known as:  DELTASONE Take 5 tablets, (50 mg) for 3 days starting 04/13/2020, followed by 4 tablets (40 mg) for 3 days starting 04/16/2020, followed by 3 tablets (30 mg) for 3 days starting 04/19/2020, followed by 2 tablets (20 mg) for 3 days starting 04/22/2020, followed by 1 tablet 10 mg p.o. daily for 3 days.            Durable Medical Equipment  (From admission, onward)         Start     Ordered   04/13/20 0934  For home use only DME oxygen  Once       Question Answer Comment  Length of Need Lifetime   Mode or (Route) Nasal cannula   Liters per Minute 2   Oxygen delivery system Gas      04/13/20 0934   04/13/20 0734  For home use only DME Bedside commode  Once       Question:  Patient needs a bedside commode to treat with the following condition  Answer:  Balance problem   04/13/20 0733          No Known Allergies  Consultations: Oncology and pulmonology   Procedures/Studies: DG Chest 2 View  Result Date: 04/03/2020 CLINICAL DATA:  Shortness of breath with hemoptysis and cough EXAM: CHEST - 2 VIEW COMPARISON:  None. FINDINGS: There is airspace opacity in the anterior segment of the right upper lobe. The lungs elsewhere are clear although mildly hyperexpanded. The heart size and pulmonary vascularity are normal. No adenopathy. No bone lesions. IMPRESSION: Airspace opacity consistent with pneumonia in the anterior segment right upper lobe. Lungs otherwise are clear although mildly hyperexpanded. Heart size normal. No adenopathy. Electronically Signed   By: Lowella Grip III M.D.   On: 04/03/2020 13:31   CT ANGIO CHEST PE W OR WO CONTRAST  Result Date: 04/05/2020 CLINICAL DATA:  69 year old with COVID-19 pneumonia with increasing shortness of breath and oxygen requirements. Personal history of bladder cancer originally diagnosed in 2012. EXAM: CT ANGIOGRAPHY CHEST WITH CONTRAST TECHNIQUE: Multidetector CT imaging of the chest was performed using the standard protocol during bolus  administration of intravenous contrast. Multiplanar CT image reconstructions and MIPs were obtained to evaluate the vascular anatomy. CONTRAST:  74mL OMNIPAQUE IOHEXOL 350 MG/ML IV. COMPARISON:  CT chest 06/29/2016. FINDINGS: Cardiovascular: Contrast opacification of pulmonary arteries is very good. No filling defects within either main pulmonary artery or their segmental branches in either lung to suggest pulmonary embolism. Normal heart size. Moderate LAD and LEFT circumflex coronary atherosclerosis. Moderate atherosclerosis involving the thoracic and upper abdominal aorta with calcified and noncalcified plaque. No evidence of aortic aneurysm. Mediastinum/Nodes: Extensive lymphadenopathy, including: -Conglomerate station 2R and 4R mediastinal nodes measuring approximately 2.9 x 2.7 x 5.0 cm. -Station 4L mediastinal nodes measuring approximately 3.1 x 2.1 x 2.9 cm -Conglomerate station 7 subcarinal mediastinal nodes measuring approximately 2.9 x 5.9 x 7.6 cm. -Conglomerate RIGHT hilar nodes measuring approximately 4.2 x 2.5 x 3.3 cm. -Borderline enlarged LEFT hilar nodes measuring 3.1 x 1.8 x 3.0 cm. -Scattered nodes are present elsewhere throughout the mediastinum. Normal appearing  esophagus. Small hiatal hernia. Visualized thyroid gland normal in appearance. Lungs/Pleura: Masslike opacity with associated architectural distortion involving the RIGHT UPPER LOBE measuring in total approximately 5.7 x 2.9 x 2.3 cm (6/34 and 7/44). Marked septal thickening involving the RIGHT UPPER LOBE, with associated ground-glass opacities posteriorly. Peripheral ground-glass opacities are present in the RIGHT LOWER LOBE, LEFT UPPER LOBE and LEFT LOWER LOBE. No pleural effusions. Central airways patent. Upper Abdomen: Mass involving the LEFT adrenal gland measuring approximately 2.0 x 3.3 cm. Small RIGHT adrenal nodule measuring approximately 1.1 x 1.7 cm. Multiple vague low-attenuation liver lesions are suspected on this early  arterial phase imaging. Approximate 8 mm gallstone in the contracted gallbladder. No pericholecystic inflammation. Musculoskeletal: Mild degenerative changes involving the thoracic spine. No visible osseous metastases. Review of the MIP images confirms the above findings. IMPRESSION: 1. No evidence of pulmonary embolism. 2. Masslike opacity with associated architectural distortion involving the RIGHT UPPER LOBE with maximum measurement of approximately 5.7 cm, likely indicating a primary bronchogenic carcinoma. 3. Extensive mediastinal and bilateral hilar lymphadenopathy. Index conglomerate nodes are measured above. 4. Peripheral ground-glass opacities in the RIGHT UPPER LOBE, RIGHT LOWER LOBE, LEFT UPPER LOBE and LEFT LOWER LOBE. There are a spectrum of findings in the lungs which can be seen with acute atypical infection (as well as other non-infectious etiologies). In particular, viral pneumonia (including COVID-19) should be considered in the appropriate clinical setting. 5. Possible lymphangitic carcinomatosis involving the RIGHT UPPER LOBE. 6. Bilateral adrenal masses, measured above, likely indicating metastatic disease. 7. Multiple vague liver metastases are suspected on this early arterial phase imaging. A dedicated CT of the abdomen and pelvis is recommended to confirm or deny these findings. I would recommend waiting 24 hours before readministering the intravenous contrast. 8. Cholelithiasis without evidence of acute cholecystitis. 9. Small hiatal hernia. Aortic Atherosclerosis (ICD10-I70.0). Electronically Signed   By: Evangeline Dakin M.D.   On: 04/05/2020 16:54   DG Chest Port 1 View  Result Date: 04/11/2020 CLINICAL DATA:  COVID-19 patient.  Patient feeling better today. EXAM: PORTABLE CHEST 1 VIEW COMPARISON:  April 03, 2020 FINDINGS: Masslike opacity in the right upper lobe may be a little smaller in the interval. The findings were considered concerning for primary bronchogenic carcinoma on  the recent CT scan. Bibasilar opacities are increased since April 03, 2020. The heart, hila, mediastinum, lungs, and pleura are otherwise unremarkable. IMPRESSION: 1. Persistent masslike opacity in the right upper lobe, stable to slightly improved in the interval. The possibility of a primary bronchogenic carcinoma was raised on the CT scan from April 05, 2020. 2. Increasing bibasilar pulmonary infiltrates worrisome for sequela of COVID-19 given history. Electronically Signed   By: Dorise Bullion III M.D   On: 04/11/2020 16:57     Discharge Exam: Vitals:   04/13/20 0316 04/13/20 0809  BP: (!) 130/54 132/62  Pulse: (!) 51 (!) 53  Resp: 17 19  Temp: 97.9 F (36.6 C) 98.2 F (36.8 C)  SpO2: 92% 96%   Vitals:   04/12/20 2132 04/12/20 2304 04/13/20 0316 04/13/20 0809  BP: 136/60 (!) 136/58 (!) 130/54 132/62  Pulse: (!) 58 (!) 57 (!) 51 (!) 53  Resp: 17 17 17 19   Temp: 97.9 F (36.6 C) 97.9 F (36.6 C) 97.9 F (36.6 C) 98.2 F (36.8 C)  TempSrc: Oral Oral Oral Oral  SpO2: 91% 90% 92% 96%  Weight:      Height:        General: Pt is alert, awake, not  in acute distress Cardiovascular: RRR, S1/S2 +, no rubs, no gallops Respiratory: Diminished breath sounds bilaterally, no wheezing, no rhonchi Abdominal: Soft, NT, ND, bowel sounds + Extremities: no edema, no cyanosis    The results of significant diagnostics from this hospitalization (including imaging, microbiology, ancillary and laboratory) are listed below for reference.     Microbiology: Recent Results (from the past 240 hour(s))  Blood culture (routine x 2)     Status: None   Collection Time: 04/03/20 11:31 PM   Specimen: BLOOD  Result Value Ref Range Status   Specimen Description BLOOD LEFT ANTECUBITAL  Final   Special Requests   Final    BOTTLES DRAWN AEROBIC AND ANAEROBIC Blood Culture adequate volume   Culture   Final    NO GROWTH 5 DAYS Performed at [facility], [address] [place],  [place] [address]    Report Status 04/08/2020 FINAL  Final  SARS Coronavirus 2 by RT PCR (hospital order, performed in [place] hospital lab) Nasopharyngeal Nasopharyngeal Swab     Status: Abnormal   Collection Time: 04/03/20 11:31 PM   Specimen: Nasopharyngeal Swab  Result Value Ref Range Status   SARS Coronavirus 2 POSITIVE (A) NEGATIVE Final    Comment: RESULT CALLED TO, READ BACK BY AND VERIFIED WITH: CHRISSY BRAND RN 0129 04/04/20 HNM (NOTE) SARS-CoV-2 target nucleic acids are DETECTED  SARS-CoV-2 RNA is generally detectable in upper respiratory specimens  during the acute phase of infection.  Positive results are indicative  of the presence of the identified virus, but do not rule out bacterial infection or co-infection with other pathogens not detected by the test.  Clinical correlation with patient history and  other diagnostic information is necessary to determine patient infection status.  The expected result is negative.  Fact Sheet for Patients:   StrictlyIdeas.no   Fact Sheet for Healthcare Providers:   BankingDealers.co.za    This test is not yet approved or cleared by the [place] FDA and  has been authorized for detection and/or diagnosis of SARS-CoV-2 by FDA under an Emergency Use Authorization (EUA).  This EUA will remain in effect (meaning this tes t can be used) for the duration of  the COVID-19 declaration under Section 564(b)(1) of the Act, 21 U.S.C. section 360-bbb-3(b)(1), unless the authorization is terminated or revoked sooner.  Performed at [facility], [address] [place], [place] [address]   Blood culture (routine x 2)     Status: None   Collection Time: 04/03/20 11:32 PM   Specimen: BLOOD  Result Value Ref Range Status   Specimen Description BLOOD BLOOD LEFT HAND  Final   Special Requests   Final    BOTTLES DRAWN AEROBIC AND ANAEROBIC Blood Culture adequate volume   Culture   Final     NO GROWTH 5 DAYS Performed at [facility], [address], [place], [place] [address]    Report Status 04/08/2020 FINAL  Final  Culture, sputum-assessment     Status: None   Collection Time: 04/04/20  4:35 AM   Specimen: Expectorated Sputum  Result Value Ref Range Status   Specimen Description EXPECTORATED SPUTUM  Final   Special Requests NONE  Final   Sputum evaluation   Final    Sputum specimen not acceptable for testing.  Please recollect.   Yancey Flemings RN AT 4782 ON 04/04/20 [facility] [place] Performed at [facility], [address] [place], [place] [address]    Report Status 04/04/2020 FINAL  Final  Labs: BNP (last 3 results) No results for input(s): BNP in the last 8760 hours. Basic Metabolic Panel: Recent Labs  Lab 04/08/20 0512 04/09/20 0513 04/10/20 0455 04/11/20 0518 04/13/20 0516  NA 144 145 141 142 140  K 4.7 4.5 5.0 5.1 4.9  CL 110 111 107 107 104  CO2 25 25 27 28 29   GLUCOSE 187* 133* 193* 186* 220*  BUN 56* 52* 49* 58* 68*  CREATININE 1.73* 1.65* 1.59* 1.66* 1.88*  CALCIUM 8.1* 8.1* 8.2* 8.6* 8.4*   Liver Function Tests: Recent Labs  Lab 04/08/20 0512 04/09/20 0513 04/10/20 0455 04/11/20 0518 04/13/20 0516  AST 23 20 17 15 16   ALT 32 29 26 27 28   ALKPHOS 43 44 42 43 49  BILITOT 0.4 0.5 0.6 0.5 0.5  PROT 6.4* 6.0* 5.8* 6.0* 5.7*  ALBUMIN 2.7* 2.6* 2.5* 2.6* 2.6*   No results for input(s): LIPASE, AMYLASE in the last 168 hours. No results for input(s): AMMONIA in the last 168 hours. CBC: Recent Labs  Lab 04/08/20 0512 04/09/20 0513 04/10/20 0455 04/11/20 0518 04/13/20 0516  WBC 6.6 8.2 3.6* 6.3 7.2  NEUTROABS 5.8 6.9 3.0 5.3 6.4  HGB 14.7 14.9 14.1 14.9 14.4  HCT 43.9 44.7 41.3 44.8 43.0  MCV 94.4 94.5 91.6 94.5 91.5  PLT 167 180 175 168 168   Cardiac Enzymes: No results for input(s): CKTOTAL, CKMB, CKMBINDEX, TROPONINI in the last 168 hours. BNP: Invalid input(s): POCBNP CBG: Recent Labs  Lab 04/12/20 0825  04/12/20 1205 04/12/20 1704 04/12/20 2135 04/13/20 0809  GLUCAP 196* 176* 163* 195* 171*   D-Dimer No results for input(s): DDIMER in the last 72 hours. Hgb A1c Recent Labs    04/11/20 0518  HGBA1C 7.1*   Lipid Profile No results for input(s): CHOL, HDL, LDLCALC, TRIG, CHOLHDL, LDLDIRECT in the last 72 hours. Thyroid function studies No results for input(s): TSH, T4TOTAL, T3FREE, THYROIDAB in the last 72 hours.  Invalid input(s): FREET3 Anemia work up [employer]    04/11/20 0518  JHERDEYC 144*   Urinalysis No results found for: COLORURINE, APPEARANCEUR, [place], [place], [place], [place], [place], [place], [place], UROBILINOGEN, NITRITE, LEUKOCYTESUR Sepsis Labs Invalid input(s): PROCALCITONIN,  WBC,  LACTICIDVEN Microbiology Recent Results (from the past 240 hour(s))  Blood culture (routine x 2)     Status: None   Collection Time: 04/03/20 11:31 PM   Specimen: BLOOD  Result Value Ref Range Status   Specimen Description BLOOD LEFT ANTECUBITAL  Final   Special Requests   Final    BOTTLES DRAWN AEROBIC AND ANAEROBIC Blood Culture adequate volume   Culture   Final    NO GROWTH 5 DAYS Performed at [facility], [address] [place], [place] [address]    Report Status 04/08/2020 FINAL  Final  SARS Coronavirus 2 by RT PCR (hospital order, performed in [place] hospital lab) Nasopharyngeal Nasopharyngeal Swab     Status: Abnormal   Collection Time: 04/03/20 11:31 PM   Specimen: Nasopharyngeal Swab  Result Value Ref Range Status   SARS Coronavirus 2 POSITIVE (A) NEGATIVE Final    Comment: RESULT CALLED TO, READ BACK BY AND VERIFIED WITH: [place] RN 0129 04/04/20 HNM (NOTE) SARS-CoV-2 target nucleic acids are DETECTED  SARS-CoV-2 RNA is generally detectable in upper respiratory specimens  during the acute phase of infection.  Positive results are indicative  of the presence of the identified virus, but do not rule out bacterial infection  or co-infection with other pathogens not detected by  the test.  Clinical correlation with patient history and  other diagnostic information is necessary to determine patient infection status.  The expected result is negative.  Fact Sheet for Patients:   StrictlyIdeas.no   Fact Sheet for Healthcare Providers:   BankingDealers.co.za    This test is not yet approved or cleared by the [place] FDA and  has been authorized for detection and/or diagnosis of SARS-CoV-2 by FDA under an Emergency Use Authorization (EUA).  This EUA will remain in effect (meaning this tes t can be used) for the duration of  the COVID-19 declaration under Section 564(b)(1) of the Act, 21 U.S.C. section 360-bbb-3(b)(1), unless the authorization is terminated or revoked sooner.  Performed at [facility], [address] [place], [place] [address]   Blood culture (routine x 2)     Status: None   Collection Time: 04/03/20 11:32 PM   Specimen: BLOOD  Result Value Ref Range Status   Specimen Description BLOOD BLOOD LEFT HAND  Final   Special Requests   Final    BOTTLES DRAWN AEROBIC AND ANAEROBIC Blood Culture adequate volume   Culture   Final    NO GROWTH 5 DAYS Performed at [facility], [address] [place], [place] [address]    Report Status 04/08/2020 FINAL  Final  Culture, sputum-assessment     Status: None   Collection Time: 04/04/20  4:35 AM   Specimen: Expectorated Sputum  Result Value Ref Range Status   Specimen Description EXPECTORATED SPUTUM  Final   Special Requests NONE  Final   Sputum evaluation   Final    Sputum specimen not acceptable for testing.  Please recollect.   Yancey Flemings RN AT 7510 ON 04/04/20 [facility] Performed at [facility], [address], [place], [place] [address]    Report Status 04/04/2020 FINAL  Final     Time coordinating discharge: Over 30 minutes  SIGNED:   Darliss [name],  MD  [facility] 04/13/2020, 10:19 AM  If 7PM-7AM, please contact night-coverage [email or web address]

## 2020-04-13 NOTE — TOC Initial Note (Addendum)
Transition of Care [facility]) - Initial/Assessment Note    Patient Details  Name: Martin Jennings MRN: [id number] Date of Birth: Jan 06, 1951  Transition of Care [facility]) CM/SW Contact:    Elliot Gurney [place], [place] Phone Number: 04/13/2020, 1:27 PM  Clinical Narrative:                 Patient is a 69 year old male. Patient contracted COVID-19 approximately 1 week ago and came to the ER with shortness of breath and weakness. Patient to discharge home today with [facility]. Patient also provided with a 3 in 1 through [employer] and oxygen through [employer]. Oxygen will be delivered to the patient's room before discharge. Patient's daughter to transport patient home. Per patient, his PCP is  Investment banker, corporate, Dr. Chancy Milroy.  Patient's daughter to provide transport home for patient.  Meliya Mcconahy, LCSW Transition of Care [phone]     IExpected Discharge Plan: [place]     Patient Goals and CMS Choice Patient states their goals for this hospitalization and ongoing recovery are:: "I am just ready to go back home"      Expected Discharge Plan and Services Expected Discharge Plan: [place] In-house Referral: Clinical Social Work     Living arrangements for the past 2 months: Single Family Home Expected Discharge Date: 04/13/20               DME Arranged: 3-N-1 DME Agency: [employer]       HH Arranged: PT [place] Agency: [place] Date [place] Agency Contacted: 04/13/20 Time HH Agency Contacted: 65 Representative spoke with at [place]: [place] Arrangements/Services Living arrangements for the past 2 months: [place] with:: Spouse Patient language and need for interpreter reviewed:: Yes Do you feel safe going back to the place where you live?: Yes      Need for Family Participation in Patient Care: Yes (Comment) Care giver support system in place?: Yes (comment)   Criminal Activity/Legal Involvement  Pertinent to Current Situation/Hospitalization: No - Comment as needed  Activities of Daily Living Home Assistive Devices/Equipment: None ADL Screening (condition at time of admission) Patient's cognitive ability adequate to safely complete daily activities?: Yes Is the patient deaf or have difficulty hearing?: No Does the patient have difficulty seeing, even when wearing glasses/contacts?: No Does the patient have difficulty concentrating, remembering, or making decisions?: No Patient able to express need for assistance with ADLs?: Yes Does the patient have difficulty dressing or bathing?: No Independently performs ADLs?: Yes (appropriate for developmental age) Does the patient have difficulty walking or climbing stairs?: No Weakness of Legs: Both Weakness of Arms/Hands: None  Permission Sought/Granted   Permission granted to share information with : Yes, Verbal Permission Granted  Share Information with NAME: Clarise Cruz  Permission granted to share info w AGENCY: [facility]        Emotional Assessment   Attitude/Demeanor/Rapport: Engaged   Orientation: : Oriented to Self, Oriented to Place, Oriented to  Time, Oriented to Situation Alcohol / Substance Use: Not Applicable Psych Involvement: No (comment)  Admission diagnosis:  Hypoxia [R09.02] CAP (community acquired pneumonia) [J18.9] Community acquired pneumonia of right upper lobe of lung [J18.9] Patient Active Problem List   Diagnosis Date Noted  . CAP (community acquired pneumonia) 04/04/2020  . ARF (acute renal failure) ([place]) 04/04/2020  . PVD (peripheral vascular disease) ([place]) 04/03/2020  . COVID-19 virus infection 04/03/2020  . Atherosclerosis of native arteries of  extremity with rest pain ([place]) 04/03/2017  . Atherosclerosis of native arteries of extremity with intermittent claudication ([place]) 01/25/2017  . Leg pain 01/25/2017  . Essential hypertension 01/25/2017  . Hyperlipidemia 01/25/2017  . Personal history of  tobacco use, presenting hazards to health 06/30/2016   PCP:  System, Provider Not In Pharmacy:   [id number] [place], [place] [address] Phone: [phone] Fax: [place] ONLY(NOT [place], [place] [address] Phone: [phone] Fax: [phone]     Social Determinants of Health (SDOH) Interventions    Readmission Risk Interventions No flowsheet data found.

## 2020-04-13 NOTE — Progress Notes (Signed)
SATURATION QUALIFICATIONS: (This note is used to comply with regulatory documentation for home oxygen)  Patient Saturations on Room Air at Rest = 91%  Patient Saturations on Room Air while Ambulating = 84%  Patient Saturations on 2 Liters of oxygen while Ambulating = 89%  Please briefly explain why patient needs home oxygen:

## 2020-04-13 NOTE — TOC Progression Note (Addendum)
Transition of Care [facility]) - Progression Note    Patient Details  Name: Martin Jennings MRN: [id number] Date of Birth: Oct 07, 1950  Transition of Care [facility]) CM/SW Contact  Ervine Witucki, [place], [place] Phone [place] 04/13/2020, 4:40 PM  Clinical Narrative:    Patient decided against the 3 in 1 BSC. BSC sanitized, [employer] called to pick up the equipment-spoke with Frazier Butt who will let Freda Munro know tomorrow.   [address], LCSW Transition of Care [phone]     Expected Discharge Plan: [place]    Expected Discharge Plan and Services Expected Discharge Plan: [place] In-house Referral: Clinical Social Work     Living arrangements for the past 2 months: Single Family Home Expected Discharge Date: 04/13/20               DME Arranged: 3-N-1 DME Agency: [employer]       HH Arranged: PT HH Agency: [place] Date Junction: 04/13/20 Time [place]: 1326 Representative spoke with at [place]: [place] Determinants of Health ([place]) Interventions    Readmission Risk Interventions No flowsheet data found.

## 2020-04-14 ENCOUNTER — Encounter: Payer: Self-pay | Admitting: *Deleted

## 2020-04-14 DIAGNOSIS — R918 Other nonspecific abnormal finding of lung field: Secondary | ICD-10-CM

## 2020-04-22 ENCOUNTER — Encounter
Admission: RE | Admit: 2020-04-22 | Discharge: 2020-04-22 | Disposition: A | Discharge status: Home / Self Care | Payer: Medicare Other | Source: Ambulatory Visit | Attending: Oncology | Admitting: Oncology

## 2020-04-22 ENCOUNTER — Other Ambulatory Visit: Payer: Self-pay

## 2020-04-22 DIAGNOSIS — R590 Localized enlarged lymph nodes: Secondary | ICD-10-CM | POA: Diagnosis not present

## 2020-04-22 DIAGNOSIS — Z9079 Acquired absence of other genital organ(s): Secondary | ICD-10-CM | POA: Insufficient documentation

## 2020-04-22 DIAGNOSIS — I999 Unspecified disorder of circulatory system: Secondary | ICD-10-CM | POA: Insufficient documentation

## 2020-04-22 DIAGNOSIS — R918 Other nonspecific abnormal finding of lung field: Secondary | ICD-10-CM | POA: Diagnosis not present

## 2020-04-22 LAB — GLUCOSE, CAPILLARY: Glucose-Capillary: 122 mg/dL — ABNORMAL HIGH (ref 70–99)

## 2020-04-22 MED ORDER — FLUDEOXYGLUCOSE F - 18 (FDG) INJECTION
9.3300 | Freq: Once | INTRAVENOUS | Status: AC | PRN
  Administered 2020-04-22: 9.33 via INTRAVENOUS

## 2020-04-23 ENCOUNTER — Other Ambulatory Visit: Payer: Self-pay | Admitting: *Deleted

## 2020-04-23 DIAGNOSIS — R591 Generalized enlarged lymph nodes: Secondary | ICD-10-CM

## 2020-04-23 DIAGNOSIS — R918 Other nonspecific abnormal finding of lung field: Secondary | ICD-10-CM

## 2020-04-23 NOTE — Progress Notes (Signed)
  Oncology Nurse Navigator Documentation  Navigator Location: [facility]-Med Onc (04/14/20 1300)   )Navigator Encounter Type: Introductory Phone Call (04/14/20 1300)   Abnormal Finding Date: 04/05/20 (04/14/20 1300)                   Treatment Phase: Abnormal Scans (04/14/20 1300) Barriers/Navigation Needs: Coordination of Care (04/14/20 1300)   Interventions: Coordination of Care (04/14/20 1300)   Coordination of Care: Appts;Radiology (04/14/20 1300)         phone call made to patient to introduce to navigator services and review upcoming appts. All questions answered during call. Pt given contact info to call back with any further questions or needs. Pt verbalized understanding. Nothing further needed at this time.         Time Spent with Patient: 30 (04/14/20 1300)

## 2020-04-24 ENCOUNTER — Inpatient Hospital Stay: Payer: Medicare Other | Attending: Oncology | Admitting: Oncology

## 2020-04-24 ENCOUNTER — Encounter: Payer: Self-pay | Admitting: *Deleted

## 2020-04-24 ENCOUNTER — Encounter: Payer: Self-pay | Admitting: Oncology

## 2020-04-24 ENCOUNTER — Other Ambulatory Visit: Payer: Self-pay

## 2020-04-24 VITALS — BP 106/64 | HR 76 | Temp 98.0°F | Resp 20 | Wt 167.2 lb

## 2020-04-24 DIAGNOSIS — R591 Generalized enlarged lymph nodes: Secondary | ICD-10-CM

## 2020-04-24 DIAGNOSIS — E785 Hyperlipidemia, unspecified: Secondary | ICD-10-CM | POA: Insufficient documentation

## 2020-04-24 DIAGNOSIS — Z902 Acquired absence of lung [part of]: Secondary | ICD-10-CM

## 2020-04-24 DIAGNOSIS — I2510 Atherosclerotic heart disease of native coronary artery without angina pectoris: Secondary | ICD-10-CM | POA: Diagnosis not present

## 2020-04-24 DIAGNOSIS — Z7982 Long term (current) use of aspirin: Secondary | ICD-10-CM | POA: Diagnosis not present

## 2020-04-24 DIAGNOSIS — R590 Localized enlarged lymph nodes: Secondary | ICD-10-CM | POA: Insufficient documentation

## 2020-04-24 DIAGNOSIS — Z87891 Personal history of nicotine dependence: Secondary | ICD-10-CM | POA: Insufficient documentation

## 2020-04-24 DIAGNOSIS — Z7952 Long term (current) use of systemic steroids: Secondary | ICD-10-CM | POA: Insufficient documentation

## 2020-04-24 DIAGNOSIS — I10 Essential (primary) hypertension: Secondary | ICD-10-CM | POA: Insufficient documentation

## 2020-04-24 DIAGNOSIS — Z8551 Personal history of malignant neoplasm of bladder: Secondary | ICD-10-CM | POA: Insufficient documentation

## 2020-04-24 DIAGNOSIS — Z8616 Personal history of COVID-19: Secondary | ICD-10-CM | POA: Diagnosis not present

## 2020-04-24 DIAGNOSIS — E279 Disorder of adrenal gland, unspecified: Secondary | ICD-10-CM | POA: Insufficient documentation

## 2020-04-24 DIAGNOSIS — Z79899 Other long term (current) drug therapy: Secondary | ICD-10-CM | POA: Insufficient documentation

## 2020-04-24 DIAGNOSIS — R918 Other nonspecific abnormal finding of lung field: Secondary | ICD-10-CM | POA: Insufficient documentation

## 2020-04-24 DIAGNOSIS — Z7902 Long term (current) use of antithrombotics/antiplatelets: Secondary | ICD-10-CM | POA: Insufficient documentation

## 2020-04-24 NOTE — Progress Notes (Signed)
Patient stated that he is constipated.

## 2020-04-24 NOTE — Progress Notes (Signed)
  Oncology Nurse Navigator Documentation  Navigator Location: [facility] Onc (04/24/20 1200)   )Navigator Encounter Type: Initial MedOnc (04/24/20 1200)                     Patient Visit Type: MedOnc (04/24/20 1200)   Barriers/Navigation Needs: Coordination of Care (04/24/20 1200)   Interventions: Coordination of Care (04/24/20 1200)   Coordination of Care: Appts;Radiology (04/24/20 1200)        Acuity: Level 2-Minimal Needs (1-2 Barriers Identified) (04/24/20 1200)    met with patient and his son during initial consult with Dr. Janese Banks. All questions answered during visit. Reviewed with patient and his son that he will be notified by phone once he is scheduled for his biopsy and follow up visit with Dr. Janese Banks. Contact info given and instructed to call with any further questions or needs. Pt and his son verbalized understanding.      Time Spent with Patient: 60 (04/24/20 1200)

## 2020-04-25 ENCOUNTER — Encounter: Payer: Self-pay | Admitting: *Deleted

## 2020-04-25 NOTE — Progress Notes (Signed)
  Oncology Nurse Navigator Documentation  Navigator Location: [facility] Onc (04/25/20 1500)   )Navigator Encounter Type: Telephone (04/25/20 1500) Telephone: Lahoma Crocker Call (04/25/20 1500)                       Barriers/Navigation Needs: Coordination of Care (04/25/20 1500)   Interventions: Coordination of Care (04/25/20 1500)   Coordination of Care: Appts (04/25/20 1500)         phone call made to patient and his son to review upcoming appts for biopsy and follow up appts. Pt and son made aware of brain MRI scheduled as well. Informed that last dose of his plavix will be on 10/8 and instructed not to resume until after his biopsy. Pt and his son verbalized understanding. Appts mailed with instructions as well.          Time Spent with Patient: 30 (04/25/20 1500)

## 2020-04-26 ENCOUNTER — Encounter: Payer: Self-pay | Admitting: Oncology

## 2020-04-26 NOTE — Progress Notes (Signed)
Hematology/Oncology Consult note [facility] [phone] [phone]  Patient Care Team: Perrin Maltese, MD as PCP - General (Internal Medicine) Telford Nab, RN as Oncology Nurse Navigator   Name of the patient: Martin Jennings  [id number]  [birth year]    Reason for referral- lung mass   Referring physician- DR. Florina Ou  Date of visit: 04/26/20   History of presenting illness- Patient is a 69 year old male with a history of tobacco dependence who was admitted to the hospital for symptoms of acute onset shortness of breath and was found to have Covid pneumonia.  He underwent CT angio chest which showed extensive mediastinal as well as bilateral hilar adenopathy.  Masslike opacity with architectural distortion involving the right upper lobe measuring 5.7 x 2.9 x 2.3 cm and groundglass opacities bilaterally.  Mass involving the left adrenal gland as well as a smaller right adrenal gland possibly indicating adrenal metastases.    This was followed by a PET CT scan 2 weeks later which showed the consolidative process in the right upper lobe measuring 7.88 concerning for infiltrating tumor.  It appears to involve the pleura associated interstitial thickening was also seen.  Bulky right hilar and mediastinal adenopathy.  Enlarged somewhat necrotic appearing left supraclavicular lymph node was also hypermetabolic.  Adrenal gland was not hypermetabolic and was consistent with a benign adenoma.  No metastatic disease involving liver.  No features of distant metastatic disease.  Patient lives with his son and family.  He is independent of his ADLs.  He does report some fatigue Covid but denies any significant exertional shortness of breath.  He is not presently on any oxygen.  Denies any pain.  Appetite and weight have remained stable  ECOG PS- 1 Pain scale- 0   Review of systems- Review of Systems  Constitutional: Positive for malaise/fatigue. Negative  for chills, fever and weight loss.  HENT: Negative for congestion, ear discharge and nosebleeds.   Eyes: Negative for blurred vision.  Respiratory: Negative for cough, hemoptysis, sputum production, shortness of breath and wheezing.   Cardiovascular: Negative for chest pain, palpitations, orthopnea and claudication.  Gastrointestinal: Negative for abdominal pain, blood in stool, constipation, diarrhea, heartburn, melena, nausea and vomiting.  Genitourinary: Negative for dysuria, flank pain, frequency, hematuria and urgency.  Musculoskeletal: Negative for back pain, joint pain and myalgias.  Skin: Negative for rash.  Neurological: Negative for dizziness, tingling, focal weakness, seizures, weakness and headaches.  Endo/Heme/Allergies: Does not bruise/bleed easily.  Psychiatric/Behavioral: Negative for depression and suicidal ideas. The patient does not have insomnia.     No Known Allergies  Patient Active Problem List   Diagnosis Date Noted  . CAP (community acquired pneumonia) 04/04/2020  . ARF (acute renal failure) ([place]) 04/04/2020  . PVD (peripheral vascular disease) ([place]) 04/03/2020  . COVID-19 virus infection 04/03/2020  . Atherosclerosis of native arteries of extremity with rest pain ([place]) 04/03/2017  . Atherosclerosis of native arteries of extremity with intermittent claudication ([place]) 01/25/2017  . Leg pain 01/25/2017  . Essential hypertension 01/25/2017  . Hyperlipidemia 01/25/2017  . Personal history of tobacco use, presenting hazards to health 06/30/2016     Past Medical History:  Diagnosis Date  . Bladder cancer ([place]) 2012  . Hypertension   . Peripheral vascular disease [facility])      Past Surgical History:  Procedure Laterality Date  . BLADDER REMOVAL    . LOWER EXTREMITY ANGIOGRAPHY Left 04/12/2017   Procedure: Lower Extremity Angiography;  Surgeon: Katha Cabal, MD;  Location: [place] [facility];  Service: Cardiovascular;  Laterality: Left;  . uretostomy     . VASCULAR SURGERY      Social History   Socioeconomic History  . Marital status: Married    Spouse name: Not on file  . Number of children: Not on file  . Years of education: Not on file  . Highest education level: Not on file  Occupational History  . Not on file  Tobacco Use  . Smoking status: Former Smoker    Packs/day: 1.25    Years: 50.00    Pack years: 62.50    Types: Cigarettes  . Smokeless tobacco: Never Used  Vaping Use  . Vaping Use: Never used  Substance and Sexual Activity  . Alcohol use: No  . Drug use: Yes    Types: Marijuana  . Sexual activity: Not on file  Other Topics Concern  . Not on file  Social History Narrative  . Not on file   Social Determinants of Health   Financial Resource Strain:   . Difficulty of Paying Living Expenses: Not on file  Food Insecurity:   . Worried About Charity fundraiser in the Last Year: Not on file  . Ran Out of Food in the Last Year: Not on file  Transportation Needs:   . Lack of Transportation (Medical): Not on file  . Lack of Transportation (Non-Medical): Not on file  Physical Activity:   . Days of Exercise per Week: Not on file  . Minutes of Exercise per Session: Not on file  Stress:   . Feeling of Stress : Not on file  Social Connections:   . Frequency of Communication with Friends and Family: Not on file  . Frequency of Social Gatherings with Friends and Family: Not on file  . Attends Religious Services: Not on file  . Active Member of Clubs or Organizations: Not on file  . Attends Archivist Meetings: Not on file  . Marital Status: Not on file  Intimate Partner Violence:   . Fear of Current or Ex-Partner: Not on file  . Emotionally Abused: Not on file  . Physically Abused: Not on file  . Sexually Abused: Not on file     History reviewed. No pertinent family history.   Current Outpatient Medications:  .  amLODipine (NORVASC) 10 MG tablet, Take 10 mg by mouth daily. , Disp: , Rfl:  .   aspirin EC 81 MG tablet, Take 81 mg by mouth daily. , Disp: , Rfl:  .  atorvastatin (LIPITOR) 20 MG tablet, Take 20 mg by mouth every evening. , Disp: , Rfl:  .  clopidogrel (PLAVIX) 75 MG tablet, Take 75 mg by mouth daily. , Disp: , Rfl:  .  predniSONE (DELTASONE) 10 MG tablet, Take 5 tablets, (50 mg) for 3 days starting 04/13/2020, followed by 4 tablets (40 mg) for 3 days starting 04/16/2020, followed by 3 tablets (30 mg) for 3 days starting 04/19/2020, followed by 2 tablets (20 mg) for 3 days starting 04/22/2020, followed by 1 tablet 10 mg p.o. daily for 3 days., Disp: 45 tablet, Rfl: 0   Physical exam:  Vitals:   04/24/20 1112  BP: 106/64  Pulse: 76  Resp: 20  Temp: 98 F (36.7 C)  SpO2: 98%  Weight: 167 lb 3.2 oz (75.8 kg)   Physical Exam Constitutional:      General: He is not in acute distress. Cardiovascular:     Rate and Rhythm: Normal rate and  regular rhythm.     Heart sounds: Normal heart sounds.  Pulmonary:     Effort: Pulmonary effort is normal.     Breath sounds: Normal breath sounds.  Abdominal:     General: Bowel sounds are normal.     Palpations: Abdomen is soft.  Skin:    General: Skin is warm and dry.  Neurological:     Mental Status: He is alert and oriented to person, place, and time.        CMP Latest Ref Rng & Units 04/13/2020  Glucose 70 - 99 mg/dL 220(H)  BUN 8 - 23 mg/dL 68(H)  Creatinine 0.61 - 1.24 mg/dL 1.88(H)  Sodium 135 - 145 mmol/L 140  Potassium 3.5 - 5.1 mmol/L 4.9  Chloride 98 - 111 mmol/L 104  CO2 22 - 32 mmol/L 29  Calcium 8.9 - 10.3 mg/dL 8.4(L)  Total Protein 6.5 - 8.1 g/dL 5.7(L)  Total Bilirubin 0.3 - 1.2 mg/dL 0.5  Alkaline Phos 38 - 126 U/L 49  AST 15 - 41 U/L 16  ALT 0 - 44 U/L 28   CBC Latest Ref Rng & Units 04/13/2020  WBC 4.0 - 10.5 K/uL 7.2  Hemoglobin 13.0 - 17.0 g/dL 14.4  Hematocrit 39 - 52 % 43.0  Platelets 150 - 400 K/uL 168    No images are attached to the encounter.  DG Chest 2 View  Result Date:  04/03/2020 CLINICAL DATA:  Shortness of breath with hemoptysis and cough EXAM: CHEST - 2 VIEW COMPARISON:  None. FINDINGS: There is airspace opacity in the anterior segment of the right upper lobe. The lungs elsewhere are clear although mildly hyperexpanded. The heart size and pulmonary vascularity are normal. No adenopathy. No bone lesions. IMPRESSION: Airspace opacity consistent with pneumonia in the anterior segment right upper lobe. Lungs otherwise are clear although mildly hyperexpanded. Heart size normal. No adenopathy. Electronically Signed   By: Lowella Grip III M.D.   On: 04/03/2020 13:31   CT ANGIO CHEST PE W OR WO CONTRAST  Result Date: 04/05/2020 CLINICAL DATA:  69 year old with COVID-19 pneumonia with increasing shortness of breath and oxygen requirements. Personal history of bladder cancer originally diagnosed in 2012. EXAM: CT ANGIOGRAPHY CHEST WITH CONTRAST TECHNIQUE: Multidetector CT imaging of the chest was performed using the standard protocol during bolus administration of intravenous contrast. Multiplanar CT image reconstructions and MIPs were obtained to evaluate the vascular anatomy. CONTRAST:  41mL OMNIPAQUE IOHEXOL 350 MG/ML IV. COMPARISON:  CT chest 06/29/2016. FINDINGS: Cardiovascular: Contrast opacification of pulmonary arteries is very good. No filling defects within either main pulmonary artery or their segmental branches in either lung to suggest pulmonary embolism. Normal heart size. Moderate LAD and LEFT circumflex coronary atherosclerosis. Moderate atherosclerosis involving the thoracic and upper abdominal aorta with calcified and noncalcified plaque. No evidence of aortic aneurysm. Mediastinum/Nodes: Extensive lymphadenopathy, including: -Conglomerate station 2R and 4R mediastinal nodes measuring approximately 2.9 x 2.7 x 5.0 cm. -Station 4L mediastinal nodes measuring approximately 3.1 x 2.1 x 2.9 cm -Conglomerate station 7 subcarinal mediastinal nodes measuring  approximately 2.9 x 5.9 x 7.6 cm. -Conglomerate RIGHT hilar nodes measuring approximately 4.2 x 2.5 x 3.3 cm. -Borderline enlarged LEFT hilar nodes measuring 3.1 x 1.8 x 3.0 cm. -Scattered nodes are present elsewhere throughout the mediastinum. Normal appearing esophagus. Small hiatal hernia. Visualized thyroid gland normal in appearance. Lungs/Pleura: Masslike opacity with associated architectural distortion involving the RIGHT UPPER LOBE measuring in total approximately 5.7 x 2.9 x 2.3 cm (6/34 and 7/44). Marked  septal thickening involving the RIGHT UPPER LOBE, with associated ground-glass opacities posteriorly. Peripheral ground-glass opacities are present in the RIGHT LOWER LOBE, LEFT UPPER LOBE and LEFT LOWER LOBE. No pleural effusions. Central airways patent. Upper Abdomen: Mass involving the LEFT adrenal gland measuring approximately 2.0 x 3.3 cm. Small RIGHT adrenal nodule measuring approximately 1.1 x 1.7 cm. Multiple vague low-attenuation liver lesions are suspected on this early arterial phase imaging. Approximate 8 mm gallstone in the contracted gallbladder. No pericholecystic inflammation. Musculoskeletal: Mild degenerative changes involving the thoracic spine. No visible osseous metastases. Review of the MIP images confirms the above findings. IMPRESSION: 1. No evidence of pulmonary embolism. 2. Masslike opacity with associated architectural distortion involving the RIGHT UPPER LOBE with maximum measurement of approximately 5.7 cm, likely indicating a primary bronchogenic carcinoma. 3. Extensive mediastinal and bilateral hilar lymphadenopathy. Index conglomerate nodes are measured above. 4. Peripheral ground-glass opacities in the RIGHT UPPER LOBE, RIGHT LOWER LOBE, LEFT UPPER LOBE and LEFT LOWER LOBE. There are a spectrum of findings in the lungs which can be seen with acute atypical infection (as well as other non-infectious etiologies). In particular, viral pneumonia (including COVID-19) should be  considered in the appropriate clinical setting. 5. Possible lymphangitic carcinomatosis involving the RIGHT UPPER LOBE. 6. Bilateral adrenal masses, measured above, likely indicating metastatic disease. 7. Multiple vague liver metastases are suspected on this early arterial phase imaging. A dedicated CT of the abdomen and pelvis is recommended to confirm or deny these findings. I would recommend waiting 24 hours before readministering the intravenous contrast. 8. Cholelithiasis without evidence of acute cholecystitis. 9. Small hiatal hernia. Aortic Atherosclerosis (ICD10-I70.0). Electronically Signed   By: Evangeline Dakin M.D.   On: 04/05/2020 16:54   NM PET Image Initial (PI) Skull Base To Thigh  Result Date: 04/23/2020 CLINICAL DATA:  Initial treatment strategy for Right upper lobe lung lesion and mediastinal and hilar adenopathy. Prior history of bladder cancer. EXAM: NUCLEAR MEDICINE PET SKULL BASE TO THIGH TECHNIQUE: 9.33 mCi F-18 FDG was injected intravenously. Full-ring PET imaging was performed from the skull base to thigh after the radiotracer. CT data was obtained and used for attenuation correction and anatomic localization. Fasting blood glucose: 122 mg/dl COMPARISON:  Chest CT 04/05/2020 FINDINGS: Mediastinal blood pool activity: SUV max 1.89 Liver activity: SUV max NA NECK: No neck mass or upper cervical adenopathy. There is an enlarged somewhat necrotic appearing left supraclavicular lymph node measuring approximately 12 mm on image 57/3. This is hypermetabolic with SUV max of 1.60. Incidental CT findings: none CHEST: Ill-defined consolidative process in the right upper lobe peripherally is hypermetabolic with SUV max of 7.37. This is certainly worrisome for infiltrating tumor. It does appear to involve the pleura and is also significant surrounding interstitial thickening which could represent interstitial spread of tumor. An infectious process is also possible but felt to be unlikely. Bulky  right hilar and mediastinal lymphadenopathy is hypermetabolic and worrisome for neoplastic lymphadenopathy. 2.3 cm right paratracheal node on image 91/3 has an SUV max of 5.38. 18.5 mm right hilar node on image 107/3 has an SUV max of 3.82. 17 mm subcarinal lymph node on image 108/3 has an SUV max of 5.31. Patchy nodular opacities at the level lung base along with extensive interstitial thickening in the left lung demonstrates areas of mild hypermetabolism. SUV max is 3.55. This appears somewhat progressive since the prior CT scan and could reflect infection or tumor. Incidental CT findings: Age advanced atherosclerotic calcifications involving the aorta and coronary arteries. Underlying  emphysematous changes and interstitial lung disease. ABDOMEN/PELVIS: Abdomen left adrenal gland lesion is not hypermetabolic and is low attenuation in and consistent with a benign adenoma. No findings suspicious for metastatic disease involving the liver. No abdominal/pelvic lymphadenopathy. Incidental CT findings: Age advanced atherosclerotic calcifications involving the aorta and branch vessels. Aortoiliac stents are noted. There is also evidence for prior fem-fem bypass graft. Small gallstone noted the gallbladder. Status post cystectomy and prostatectomy with ileal conduit. SKELETON: No significant bony findings. No evidence of metastatic bone disease. Incidental CT findings: none IMPRESSION: 1. Hypermetabolic ill-defined right upper lobe nodular irregular airspace process suspicious for infiltrating neoplasm. There is also hypermetabolic right hilar and mediastinal lymphadenopathy. Metastatic disease is also possible but seems less likely. 2. Hypermetabolic left supraclavicular lymph node may be amenable to ultrasound-guided biopsy. 3. Progressive nodular airspace process in the left lower lobe demonstrating mild hypermetabolism. Infection versus neoplasm. 4. No abdominal/pelvic metastatic disease. 5. Surgical changes from  cysto prostatectomy and ileal conduit. 6. Significant age advanced vascular disease. Electronically Signed   By: Marijo Sanes M.D.   On: 04/23/2020 11:09   DG Chest Port 1 View  Result Date: 04/11/2020 CLINICAL DATA:  COVID-19 patient.  Patient feeling better today. EXAM: PORTABLE CHEST 1 VIEW COMPARISON:  April 03, 2020 FINDINGS: Masslike opacity in the right upper lobe may be a little smaller in the interval. The findings were considered concerning for primary bronchogenic carcinoma on the recent CT scan. Bibasilar opacities are increased since April 03, 2020. The heart, hila, mediastinum, lungs, and pleura are otherwise unremarkable. IMPRESSION: 1. Persistent masslike opacity in the right upper lobe, stable to slightly improved in the interval. The possibility of a primary bronchogenic carcinoma was raised on the CT scan from April 05, 2020. 2. Increasing bibasilar pulmonary infiltrates worrisome for sequela of COVID-19 given history. Electronically Signed   By: Dorise Bullion III M.D   On: 04/11/2020 16:57    Assessment and plan- Patient is a 69 y.o. male with newly diagnosed right upper lobe lung mass as well as bilateral hilar, mediastinal and supraclavicular adenopathy concerning for lung cancer  I have reviewed PET/CT scan images independently and discussed the findings of PET/CT as well as CT scan with the patient in detail.  Original CT chest showed right upper lobe lung mass along with mediastinal and hilar adenopathy but there was also some concern for adrenal and liver metastatic disease.  However on the PET scan the adrenal lesions were more consistent with a benign adenoma and there was no metastatic disease involving the liver.  Presently PET scan mainly shows a right upper lobe lung mass that is hypermetabolic and involving the pleura and locoregional adenopathy but also contralateral left supraclavicular adenopathy.  At this time I would recommend CT-guided biopsy of the left  supraclavicular lymph node.  If that is not available on the day of the biopsy the right upper lobe lung mass should be amenable to biopsy as it is peripherally located in involving the pleura.  I would recommend getting that biopsied by IR first.  If biopsy is inconclusive I will refer him back to Dr. Patsey Berthold for EBUS guided biopsy.  Patient is currently on Plavix and we will need to get clearance to hold the drug prior to biopsy.  I will obtain MRI brain with and without contrast to complete his staging work-up.  I will see him after biopsy results are back to discuss further management.  Based on PET scan patient likely has stage III  disease and would benefit from concurrent chemoradiation based on etiology.  Patient and his son comprehend my plan well.  They had multiple insightful questions which were answered to their satisfaction   Thank you for this kind referral and the opportunity to participate in the care of this  Patient   Visit Diagnosis 1. Lung mass   2. Lymphadenopathy     Dr. Randa Evens, MD, MPH [facility] at [facility] [phone] 04/26/2020  10:11 AM

## 2020-05-05 NOTE — Progress Notes (Signed)
Patient on schedule for Supraclavicular LN biopsy 05/08/2020, called and spoke with patient on phone. Made aware to be here @ 0930, NPO after MN in case sedation decided and driver for discharge after procedure and discharge home. Stated understanding.stated he has held Plavix since 05/02/2020

## 2020-05-07 ENCOUNTER — Other Ambulatory Visit: Payer: Self-pay | Admitting: Student

## 2020-05-08 ENCOUNTER — Ambulatory Visit
Admission: RE | Admit: 2020-05-08 | Discharge: 2020-05-08 | Disposition: A | Discharge status: Home / Self Care | Payer: Medicare Other | Source: Ambulatory Visit | Attending: Oncology | Admitting: Oncology

## 2020-05-08 ENCOUNTER — Other Ambulatory Visit: Payer: Self-pay

## 2020-05-08 ENCOUNTER — Ambulatory Visit: Payer: Medicare Other

## 2020-05-08 DIAGNOSIS — R918 Other nonspecific abnormal finding of lung field: Secondary | ICD-10-CM | POA: Diagnosis present

## 2020-05-08 DIAGNOSIS — C770 Secondary and unspecified malignant neoplasm of lymph nodes of head, face and neck: Secondary | ICD-10-CM | POA: Diagnosis not present

## 2020-05-08 DIAGNOSIS — R591 Generalized enlarged lymph nodes: Secondary | ICD-10-CM

## 2020-05-08 LAB — CBC WITH DIFFERENTIAL/PLATELET
Abs Immature Granulocytes: 0.13 10*3/uL — ABNORMAL HIGH (ref 0.00–0.07)
Basophils Absolute: 0 10*3/uL (ref 0.0–0.1)
Basophils Relative: 0 %
Eosinophils Absolute: 0.1 10*3/uL (ref 0.0–0.5)
Eosinophils Relative: 1 %
HCT: 38.6 % — ABNORMAL LOW (ref 39.0–52.0)
Hemoglobin: 12.6 g/dL — ABNORMAL LOW (ref 13.0–17.0)
Immature Granulocytes: 2 %
Lymphocytes Relative: 17 %
Lymphs Abs: 1.2 10*3/uL (ref 0.7–4.0)
MCH: 31.1 pg (ref 26.0–34.0)
MCHC: 32.6 g/dL (ref 30.0–36.0)
MCV: 95.3 fL (ref 80.0–100.0)
Monocytes Absolute: 0.6 10*3/uL (ref 0.1–1.0)
Monocytes Relative: 8 %
Neutro Abs: 5.3 10*3/uL (ref 1.7–7.7)
Neutrophils Relative %: 72 %
Platelets: 141 10*3/uL — ABNORMAL LOW (ref 150–400)
RBC: 4.05 MIL/uL — ABNORMAL LOW (ref 4.22–5.81)
RDW: 13.4 % (ref 11.5–15.5)
WBC: 7.3 10*3/uL (ref 4.0–10.5)
nRBC: 0 % (ref 0.0–0.2)

## 2020-05-08 MED ORDER — SODIUM CHLORIDE 0.9 % IV SOLN: INTRAVENOUS | Status: DC

## 2020-05-08 NOTE — Procedures (Signed)
Interventional Radiology Procedure Note  Procedure: Ultrasound guided left supraclavicular lymph node biopsy  Findings: Please refer to procedural dictation for full description. 18 ga core biopsy x4.  Samples placed in formalin, sent to Pathology.  Complications: None immediate.  Estimated Blood Loss: <5 mL  Recommendations: Follow up biopsy results.   Ruthann Cancer, MD Pager: [phone]

## 2020-05-08 NOTE — Discharge Instructions (Signed)

## 2020-05-10 ENCOUNTER — Ambulatory Visit
Admission: RE | Admit: 2020-05-10 | Discharge: 2020-05-10 | Disposition: A | Discharge status: Home / Self Care | Payer: Medicare Other | Source: Ambulatory Visit | Attending: Oncology | Admitting: Oncology

## 2020-05-10 ENCOUNTER — Other Ambulatory Visit: Payer: Self-pay

## 2020-05-10 ENCOUNTER — Other Ambulatory Visit: Payer: Self-pay | Admitting: Oncology

## 2020-05-10 DIAGNOSIS — R591 Generalized enlarged lymph nodes: Secondary | ICD-10-CM | POA: Insufficient documentation

## 2020-05-10 DIAGNOSIS — R918 Other nonspecific abnormal finding of lung field: Secondary | ICD-10-CM | POA: Diagnosis present

## 2020-05-10 MED ORDER — GADOBUTROL 1 MMOL/ML IV SOLN: 7.5000 mL | Freq: Once | INTRAVENOUS | Status: DC | PRN

## 2020-05-10 NOTE — Progress Notes (Signed)
Patient came in today for MRI Brain appointment. MRI staff were not able to obtain IV access and hospital IV team was very delayed in being able to come to department. Patient was informed of long wait for IV team and wished to go home and be rescheduled later for the contrasted images as he stated he has other matters to attend to today.

## 2020-05-13 ENCOUNTER — Other Ambulatory Visit: Payer: Self-pay | Admitting: Oncology

## 2020-05-13 ENCOUNTER — Other Ambulatory Visit: Payer: Self-pay

## 2020-05-13 ENCOUNTER — Encounter: Payer: Self-pay | Admitting: Pulmonary Disease

## 2020-05-13 ENCOUNTER — Telehealth: Payer: Self-pay | Admitting: Pulmonary Disease

## 2020-05-13 ENCOUNTER — Ambulatory Visit (INDEPENDENT_AMBULATORY_CARE_PROVIDER_SITE_OTHER): Payer: Medicare Other | Admitting: Pulmonary Disease

## 2020-05-13 VITALS — BP 132/70 | HR 85 | Temp 97.1°F | Ht 71.0 in | Wt 170.4 lb

## 2020-05-13 DIAGNOSIS — C3491 Malignant neoplasm of unspecified part of right bronchus or lung: Secondary | ICD-10-CM

## 2020-05-13 DIAGNOSIS — J9611 Chronic respiratory failure with hypoxia: Secondary | ICD-10-CM | POA: Diagnosis not present

## 2020-05-13 DIAGNOSIS — R042 Hemoptysis: Secondary | ICD-10-CM

## 2020-05-13 DIAGNOSIS — Z87891 Personal history of nicotine dependence: Secondary | ICD-10-CM

## 2020-05-13 DIAGNOSIS — Z8616 Personal history of COVID-19: Secondary | ICD-10-CM

## 2020-05-13 DIAGNOSIS — J449 Chronic obstructive pulmonary disease, unspecified: Secondary | ICD-10-CM

## 2020-05-13 LAB — SURGICAL PATHOLOGY

## 2020-05-13 MED ORDER — TRELEGY ELLIPTA 100-62.5-25 MCG/INH IN AEPB: 1.0000 | INHALATION_SPRAY | Freq: Every day | RESPIRATORY_TRACT | 0 refills | Status: AC

## 2020-05-13 NOTE — Telephone Encounter (Signed)
Spoke to patient, who stated that name of DME company [employer].  Per my research, it appears that [employer] is the name of the machine. Patient is going to research name of DME company further and call us back.

## 2020-05-13 NOTE — Patient Instructions (Addendum)
You will need to continue using your oxygen at 2 L/min  We are giving you a trial of an inhaler called Trelegy Ellipta 1 inhalation daily.  Let us know if this inhaler is helping you and we will call you in a prescription.  Stop your baby aspirin but continue your Plavix (clopidogrel) which is your blood thinner.  Keep your appointments with Dr. Janese Banks and Dr. Baruch Gouty will be very important.  We will see you in follow-up in 4 to 6 weeks time.

## 2020-05-13 NOTE — Progress Notes (Signed)
Subjective:    Patient ID: Martin Jennings, male    DOB: [birth year], 69 y.o.   MRN: [id number]  HPI Martin Jennings  is a 69 year old recent former smoker (quit 02/2020) who presents for follow-up on the issue of hemoptysis and a lung mass.  The patient was seen in consultation on 07 April 2020 while he was admitted at [facility] for COVID-19 related acute respiratory failure.  The see that note for details available through Epic.  He was discharged from [facility] 13 April 2020 on 2 L/min nasal cannula O2.  He states he has been wearing the oxygen mostly at nighttime and occasionally with exertion.  He has been "weaning himself" from oxygen.  He continues to have intermittent hemoptysis, hemoptysis improved during hospitalization with withholding aspirin.  He is back on aspirin, he continues to take Plavix.  Hemoptysis that preceded his COVID-19 diagnosis.  He feels overall markedly improved from when he was ill with COVID-19.  He has recently completed his COVID-19 vaccination series.  PET/CT was obtained on 23 April 2020, this showed FDG avidity on the previously noted right upper lobe mass and hilar and mediastinal adenopathy.  No uptake on liver or previously noted enlarged adrenal glands.  There was a left supraclavicular lymph node that was also hypermetabolic.  This was biopsied on 14 October, was consistent with a non-small cell carcinoma, immunohistochemical stains confirm adenocarcinoma of lung origin.  Patient to see oncology tomorrow.  He has had issues with shortness of breath that are no worse than prior to his COVID-19 diagnosis.  He feels he is getting back to baseline.  He is not on maintenance medication for COPD.  Review of Systems A 10 point review of systems was performed and it is as noted above otherwise negative.  No Known Allergies  Current Meds  Medication Sig  . amLODipine (NORVASC) 10 MG tablet Take 10 mg by mouth daily.   Marland Kitchen aspirin EC 81 MG tablet Take 81 mg by mouth  daily.   Marland Kitchen atorvastatin (LIPITOR) 20 MG tablet Take 20 mg by mouth every evening.   . clopidogrel (PLAVIX) 75 MG tablet Take 75 mg by mouth daily.    Immunization History  Administered Date(s) Administered  . Moderna SARS-COVID-2 Vaccination 03/24/2020, 04/21/2020   Social History   Tobacco Use  . Smoking status: Former Smoker    Packs/day: 1.25    Years: 50.00    Pack years: 62.50    Types: Cigarettes    Quit date: 03/13/2020    Years since quitting: 0.1  . Smokeless tobacco: Never Used  Substance Use Topics  . Alcohol use: No      Objective:   Physical Exam BP 132/70 (BP Location: Left Arm, Patient Position: Sitting, Cuff Size: Normal)   Pulse 85   Temp (!) 97.1 F (36.2 C) (Temporal)   Ht 5\' 11"  (1.803 m)   Wt 170 lb 6.4 oz (77.3 kg)   SpO2 93%   BMI 23.77 kg/m  GENERAL: Well-developed slender gentleman, no acute distress.  Chronically ill-appearing.  Fully ambulatory.  HEAD: Normocephalic, atraumatic.  EYES: Pupils equal, round, reactive to light.  No scleral icterus.  MOUTH:  Nose/mouth/throat not examined due to masking requirements for COVID 19. NECK: Supple. No thyromegaly. Trachea midline. No JVD.  Left supraclavicular node palpable. PULMONARY: Good air entry bilaterally.   Coarse breath sounds with scattered rhonchi.  No wheezes. CARDIOVASCULAR: S1 and S2. Regular rate and rhythm.  No rubs, murmurs or gallops heard.  ABDOMEN: Scaphoid, soft, nondistended. MUSCULOSKELETAL: No joint deformity, no clubbing, no edema.  NEUROLOGIC: No overt focal deficit. Speech is fluent.  Gait is steady. SKIN: Intact,warm,dry. PSYCH:  Somewhat flat affect. Normal behavior.  Ambulatory oximetry performed today: This showed desaturation to 87% on room air, patient maintained saturations at 90 to 92% on 2 L/min nasal cannula O2.     Assessment & Plan:     ICD-10-CM   1. Hemoptysis  R04.2    This should resolve with radiation therapy to the right upper lobe mass Will consider  hemoptysis if radiation fails to resolve this issue Stop aspirin  2. Adenocarcinoma of right lung [facility])  C34.91    Follow-up with medical and radiation oncology Patient will likely need concurrent chemoradiation  3. COPD suggested by initial evaluation ([place])  J44.9    Trial of Trelegy Ellipta 1 inhalation daily Continue tobacco abstinence  4. Chronic respiratory failure with hypoxia (HCC)  J96.11    Following COVID-19 Continues to qualify for oxygen by ambulatory oximetry Continue oxygen at 2 L/min with activity and during sleep  5. Former heavy cigarette smoker (20-39 per day)  Z87.891    Commended on discontinuation of smoking Discussed strategies to avoid relapse  6. Personal history of COVID-19  Z86.16    This issue adds complexity to his management He has mild persistent sequela from this    Meds ordered this encounter  Medications  . Fluticasone-Umeclidin-Vilant (TRELEGY ELLIPTA) 100-62.5-25 MCG/INH AEPB    Sig: Inhale 1 puff into the lungs daily for 1 day.    Dispense:  14 each    Refill:  0    Order Specific Question:   Lot Number?    Answer:   [id number]    Order Specific Question:   Expiration Date?    Answer:   10/24/2021    Order Specific Question:   Quantity    Answer:   1  . Fluticasone-Umeclidin-Vilant (TRELEGY ELLIPTA) 100-62.5-25 MCG/INH AEPB    Sig: Inhale 1 puff into the lungs daily for 1 day.    Dispense:  14 each    Refill:  0    Order Specific Question:   Lot Number?    Answer:   [id number]    Order Specific Question:   Expiration Date?    Answer:   10/24/2021    Order Specific Question:   Manufacturer?    Answer:   [employer] [[id number]]    Order Specific Question:   Quantity    Answer:   1   Discussion:  Patient continues to qualify for oxygen with activity and during rest.  He was advised to continue using his oxygen as prescribed previously.  He needs better compensation of his respiratory status and will give him a trial of Trelegy Ellipta.  He is to notify us  if this medication is effective and we will send prescription to his pharmacy.  With regards to his hemoptysis, this is minor hemoptysis, this should resolve with radiation therapy to his lung mass.  If it persists we will consider bronchoscopy at that time.  He is in agreement with this plan.  I have recommended that he continue Plavix which is needed for his lower extremity stents.  Hold aspirin.  Previously responded to this while he was hospitalized.  He has been encouraged to keep his appointments with Dr. Randa Evens and Dr. Noreene Filbert as scheduled, we went over the times and dates for these.  We will see him in follow-up in 4  to 6 weeks time, he is to contact us prior to that time should any new problems arise.   Renold Don, MD  PCCM   *This note was dictated using voice recognition software/Dragon.  Despite best efforts to proofread, errors can occur which can change the meaning.  Any change was purely unintentional.

## 2020-05-15 ENCOUNTER — Other Ambulatory Visit: Payer: Self-pay

## 2020-05-15 ENCOUNTER — Other Ambulatory Visit: Payer: Medicare Other

## 2020-05-15 ENCOUNTER — Inpatient Hospital Stay: Payer: Medicare Other | Attending: Oncology | Admitting: Oncology

## 2020-05-15 ENCOUNTER — Telehealth (INDEPENDENT_AMBULATORY_CARE_PROVIDER_SITE_OTHER): Payer: Self-pay

## 2020-05-15 ENCOUNTER — Encounter: Payer: Self-pay | Admitting: Oncology

## 2020-05-15 ENCOUNTER — Encounter: Payer: Self-pay | Admitting: *Deleted

## 2020-05-15 VITALS — BP 125/70 | HR 84 | Temp 98.6°F | Resp 16

## 2020-05-15 DIAGNOSIS — Z5111 Encounter for antineoplastic chemotherapy: Secondary | ICD-10-CM | POA: Diagnosis not present

## 2020-05-15 DIAGNOSIS — Z7189 Other specified counseling: Secondary | ICD-10-CM | POA: Diagnosis not present

## 2020-05-15 DIAGNOSIS — Z8673 Personal history of transient ischemic attack (TIA), and cerebral infarction without residual deficits: Secondary | ICD-10-CM | POA: Diagnosis not present

## 2020-05-15 DIAGNOSIS — I10 Essential (primary) hypertension: Secondary | ICD-10-CM | POA: Diagnosis not present

## 2020-05-15 DIAGNOSIS — Z87891 Personal history of nicotine dependence: Secondary | ICD-10-CM | POA: Diagnosis not present

## 2020-05-15 DIAGNOSIS — C3411 Malignant neoplasm of upper lobe, right bronchus or lung: Secondary | ICD-10-CM | POA: Insufficient documentation

## 2020-05-15 DIAGNOSIS — Z7982 Long term (current) use of aspirin: Secondary | ICD-10-CM | POA: Insufficient documentation

## 2020-05-15 DIAGNOSIS — Z7952 Long term (current) use of systemic steroids: Secondary | ICD-10-CM | POA: Diagnosis not present

## 2020-05-15 DIAGNOSIS — C3401 Malignant neoplasm of right main bronchus: Secondary | ICD-10-CM | POA: Diagnosis not present

## 2020-05-15 DIAGNOSIS — Z79899 Other long term (current) drug therapy: Secondary | ICD-10-CM | POA: Diagnosis not present

## 2020-05-15 DIAGNOSIS — C3490 Malignant neoplasm of unspecified part of unspecified bronchus or lung: Secondary | ICD-10-CM | POA: Diagnosis not present

## 2020-05-15 NOTE — Progress Notes (Signed)
  Oncology Nurse Navigator Documentation  Navigator Location: [facility] (05/15/20 1300)   )Navigator Encounter Type: Follow-up Appt (05/15/20 1300)     Confirmed Diagnosis Date: 05/09/20 (05/15/20 1300)               Patient Visit Type: MedOnc (05/15/20 1300) Treatment Phase: Pre-Tx/Tx Discussion (05/15/20 1300) Barriers/Navigation Needs: Coordination of Care;Education (05/15/20 1300) Education: Newly Diagnosed Cancer Education;Understanding Cancer/ Treatment Options (05/15/20 1300) Interventions: Coordination of Care;Education;Referrals (05/15/20 1300) Referrals: Radiation Oncology (05/15/20 1300) Coordination of Care: Appts;Chemo (05/15/20 1300) Education Method: Written;Verbal (05/15/20 1300)     met with patient during follow up visit with Dr. Janese Banks to discuss pathology results and treatment options. All questions answered during visit. Pt given resources regarding diagnosis and supportive services available. Reviewed upcoming appts. Pt encouraged to bring one caregiver with him for chemo education class. Nothing further needed at this time. Instructed pt to call with any further questions or needs. Pt verbalized understanding. Nothing further needed at this time.           Time Spent with Patient: 60 (05/15/20 1300)

## 2020-05-15 NOTE — Telephone Encounter (Signed)
Patient stated name of DME company is [employer].  [facility] note has been updated. Nothing further needed.

## 2020-05-15 NOTE — Telephone Encounter (Signed)
Spoke with the patient and he is scheduled with Dr. Lucky Cowboy for a port placement on 05/22/20 with a 6:45 am arrival time to the [facility]. Covid testing on 05/20/20 between 8-1 pm at the [facility]. Pre-procedure instructions were discussed and will be mailed.

## 2020-05-15 NOTE — Telephone Encounter (Signed)
Called and spoke with someone to have the patient return my call.

## 2020-05-15 NOTE — Progress Notes (Signed)
Pt continues to cough up blood, mostly when he is sitting. He feels that when he is up doing things he does not cough as much when he just sits. It like tablespoon each time he coughs. He coughed in the room and it is bright red and equal to tablespoon. Drinking and eating good. He did get inhaler from Dr. Duwayne Heck but not sure what it was

## 2020-05-16 NOTE — Progress Notes (Addendum)
Tumor Board Documentation  Martin Jennings was presented by Dr Janese Banks at our Tumor Board on 05/15/2020, which included representatives from medical oncology, radiation oncology, surgical oncology, internal medicine, navigation, pathology, radiology, surgical, pharmacy, genetics, research, palliative care, pulmonology.  Martin Jennings currently presents as a current patient, for [place], for new positive pathology with history of the following treatments: active survellience, surgical intervention(s).  Additionally, we reviewed previous medical and familial history, history of present illness, and recent lab results along with all available histopathologic and imaging studies. The tumor board considered available treatment options and made the following recommendations: Concurrent chemo-radiation therapy    The following procedures/referrals were also placed: No orders of the defined types were placed in this encounter.   Clinical Trial Status: not discussed   Staging used: AJCC Stage Group  AJCC Staging: T: 4 N: 3 M: 0 Group: Stage 3C Adenocarcinoma of Lung  National site-specific guidelines NCCN were discussed with respect to the case.  Tumor board is a meeting of clinicians from various specialty areas who evaluate and discuss patients for whom a multidisciplinary approach is being considered. Final determinations in the plan of care are those of the provider(s). The responsibility for follow up of recommendations given during tumor board is that of the provider.   Today's extended care, comprehensive team conference, Martin Jennings was not present for the discussion and was not examined.   Multidisciplinary Tumor Board is a multidisciplinary case peer review process.  Decisions discussed in the Multidisciplinary Tumor Board reflect the opinions of the specialists present at the conference without having examined the patient.  Ultimately, treatment and diagnostic decisions rest with the primary provider(s) and  the patient.

## 2020-05-19 ENCOUNTER — Other Ambulatory Visit: Payer: Self-pay

## 2020-05-19 ENCOUNTER — Encounter: Payer: Self-pay | Admitting: Radiation Oncology

## 2020-05-19 ENCOUNTER — Ambulatory Visit
Admission: RE | Admit: 2020-05-19 | Discharge: 2020-05-19 | Disposition: A | Discharge status: Home / Self Care | Payer: Medicare Other | Source: Ambulatory Visit | Attending: Radiation Oncology | Admitting: Radiation Oncology

## 2020-05-19 VITALS — BP 116/69 | HR 94 | Temp 97.3°F | Resp 20 | Wt 170.5 lb

## 2020-05-19 DIAGNOSIS — Z7189 Other specified counseling: Secondary | ICD-10-CM | POA: Insufficient documentation

## 2020-05-19 DIAGNOSIS — U071 COVID-19: Secondary | ICD-10-CM | POA: Diagnosis not present

## 2020-05-19 DIAGNOSIS — F17210 Nicotine dependence, cigarettes, uncomplicated: Secondary | ICD-10-CM | POA: Insufficient documentation

## 2020-05-19 DIAGNOSIS — I739 Peripheral vascular disease, unspecified: Secondary | ICD-10-CM | POA: Insufficient documentation

## 2020-05-19 DIAGNOSIS — Z8551 Personal history of malignant neoplasm of bladder: Secondary | ICD-10-CM | POA: Diagnosis not present

## 2020-05-19 DIAGNOSIS — Z7982 Long term (current) use of aspirin: Secondary | ICD-10-CM | POA: Diagnosis not present

## 2020-05-19 DIAGNOSIS — I10 Essential (primary) hypertension: Secondary | ICD-10-CM | POA: Insufficient documentation

## 2020-05-19 DIAGNOSIS — C3400 Malignant neoplasm of unspecified main bronchus: Secondary | ICD-10-CM

## 2020-05-19 DIAGNOSIS — C3411 Malignant neoplasm of upper lobe, right bronchus or lung: Secondary | ICD-10-CM | POA: Diagnosis not present

## 2020-05-19 DIAGNOSIS — Z79899 Other long term (current) drug therapy: Secondary | ICD-10-CM | POA: Diagnosis not present

## 2020-05-19 DIAGNOSIS — C3490 Malignant neoplasm of unspecified part of unspecified bronchus or lung: Secondary | ICD-10-CM | POA: Insufficient documentation

## 2020-05-19 DIAGNOSIS — J1282 Pneumonia due to coronavirus disease 2019: Secondary | ICD-10-CM | POA: Insufficient documentation

## 2020-05-19 HISTORY — DX: Other specified counseling: Z71.89

## 2020-05-19 MED ORDER — LIDOCAINE-PRILOCAINE 2.5-2.5 % EX CREA: TOPICAL_CREAM | CUTANEOUS | 3 refills | Status: DC

## 2020-05-19 MED ORDER — ONDANSETRON HCL 8 MG PO TABS: 8.0000 mg | ORAL_TABLET | Freq: Two times a day (BID) | ORAL | 1 refills | Status: DC | PRN

## 2020-05-19 MED ORDER — PROCHLORPERAZINE MALEATE 10 MG PO TABS: 10.0000 mg | ORAL_TABLET | Freq: Four times a day (QID) | ORAL | 1 refills | Status: DC | PRN

## 2020-05-19 MED ORDER — LORAZEPAM 0.5 MG PO TABS: 0.5000 mg | ORAL_TABLET | Freq: Four times a day (QID) | ORAL | 0 refills | Status: DC | PRN

## 2020-05-19 MED ORDER — DEXAMETHASONE 4 MG PO TABS: 8.0000 mg | ORAL_TABLET | Freq: Every day | ORAL | 1 refills | Status: DC

## 2020-05-19 NOTE — H&P (View-Only) (Signed)
Hematology/Oncology Consult note [facility]  [phone] [phone]  Patient Care Team: Perrin Maltese, MD as PCP - General (Internal Medicine) Telford Nab, RN as Oncology Nurse Navigator   Name of the patient: Martin Jennings  [id number]  10/16/1950   Date of visit: 05/19/20  Diagnosis-stage IIIc adenocarcinoma of the lung cT3 cN3 M0  Chief complaint/ Reason for visit-discuss final pathology results and further management  Heme/Onc history: Patient is a 69 year old male with a history of tobacco dependence who was admitted to the hospital for symptoms of acute onset shortness of breath and was found to have Covid pneumonia.  He underwent CT angio chest which showed extensive mediastinal as well as bilateral hilar adenopathy.  Masslike opacity with architectural distortion involving the right upper lobe measuring 5.7 x 2.9 x 2.3 cm and groundglass opacities bilaterally.  Mass involving the left adrenal gland as well as a smaller right adrenal gland possibly indicating adrenal metastases.    This was followed by a PET CT scan 2 weeks later which showed the consolidative process in the right upper lobe measuring 7.88 concerning for infiltrating tumor.  It appears to involve the pleura associated interstitial thickening was also seen.  Bulky right hilar and mediastinal adenopathy.  Enlarged somewhat necrotic appearing left supraclavicular lymph node was also hypermetabolic.  Adrenal gland was not hypermetabolic and was consistent with a benign adenoma.  No metastatic disease involving liver.  No features of distant metastatic disease.  Pathology showed metastatic non-small cell lung cancer which was TTF-1 positive consistent with adenocarcinoma.  Interval history-patient is here by himself today.  He reports some fatigue and occasional episodes of hemoptysis.  ECOG PS- 1 Pain scale- 0 Opioid associated constipation- no  Review of systems-  Review of Systems  Constitutional: Positive for malaise/fatigue. Negative for chills, fever and weight loss.  HENT: Negative for congestion, ear discharge and nosebleeds.   Eyes: Negative for blurred vision.  Respiratory: Positive for hemoptysis. Negative for cough, sputum production, shortness of breath and wheezing.   Cardiovascular: Negative for chest pain, palpitations, orthopnea and claudication.  Gastrointestinal: Negative for abdominal pain, blood in stool, constipation, diarrhea, heartburn, melena, nausea and vomiting.  Genitourinary: Negative for dysuria, flank pain, frequency, hematuria and urgency.  Musculoskeletal: Negative for back pain, joint pain and myalgias.  Skin: Negative for rash.  Neurological: Negative for dizziness, tingling, focal weakness, seizures, weakness and headaches.  Endo/Heme/Allergies: Does not bruise/bleed easily.  Psychiatric/Behavioral: Negative for depression and suicidal ideas. The patient does not have insomnia.        No Known Allergies   Past Medical History:  Diagnosis Date  . Bladder cancer ([place]) 2012  . Hypertension   . Peripheral vascular disease [facility])      Past Surgical History:  Procedure Laterality Date  . BLADDER REMOVAL    . LOWER EXTREMITY ANGIOGRAPHY Left 04/12/2017   Procedure: Lower Extremity Angiography;  Surgeon: Katha Cabal, MD;  Location: [facility];  Service: Cardiovascular;  Laterality: Left;  . uretostomy    . VASCULAR SURGERY      Social History   Socioeconomic History  . Marital status: Married    Spouse name: Not on file  . Number of children: Not on file  . Years of education: Not on file  . Highest education level: Not on file  Occupational History  . Not on file  Tobacco Use  . Smoking status: Former Smoker    Packs/day: 1.25  Years: 50.00    Pack years: 62.50    Types: Cigarettes    Quit date: 03/13/2020    Years since quitting: 0.1  . Smokeless tobacco: Never Used  Vaping Use   . Vaping Use: Never used  Substance and Sexual Activity  . Alcohol use: No  . Drug use: Not Currently    Types: Marijuana  . Sexual activity: Not Currently  Other Topics Concern  . Not on file  Social History Narrative  . Not on file   Social Determinants of Health   Financial Resource Strain:   . Difficulty of Paying Living Expenses: Not on file  Food Insecurity:   . Worried About Charity fundraiser in the Last Year: Not on file  . Ran Out of Food in the Last Year: Not on file  Transportation Needs:   . Lack of Transportation (Medical): Not on file  . Lack of Transportation (Non-Medical): Not on file  Physical Activity:   . Days of Exercise per Week: Not on file  . Minutes of Exercise per Session: Not on file  Stress:   . Feeling of Stress : Not on file  Social Connections:   . Frequency of Communication with Friends and Family: Not on file  . Frequency of Social Gatherings with Friends and Family: Not on file  . Attends Religious Services: Not on file  . Active Member of Clubs or Organizations: Not on file  . Attends Archivist Meetings: Not on file  . Marital Status: Not on file  Intimate Partner Violence:   . Fear of Current or Ex-Partner: Not on file  . Emotionally Abused: Not on file  . Physically Abused: Not on file  . Sexually Abused: Not on file    History reviewed. No pertinent family history.   Current Outpatient Medications:  .  amLODipine (NORVASC) 10 MG tablet, Take 10 mg by mouth daily. , Disp: , Rfl:  .  atorvastatin (LIPITOR) 20 MG tablet, Take 20 mg by mouth every evening. , Disp: , Rfl:  .  clopidogrel (PLAVIX) 75 MG tablet, Take 75 mg by mouth daily. , Disp: , Rfl:  .  aspirin EC 81 MG tablet, Take 81 mg by mouth daily.  (Patient not taking: Reported on 05/15/2020), Disp: , Rfl:   Physical exam:  Vitals:   05/15/20 1049  BP: 125/70  Pulse: 84  Resp: 16  Temp: 98.6 F (37 C)  TempSrc: Oral   Physical Exam Cardiovascular:      Rate and Rhythm: Normal rate and regular rhythm.     Heart sounds: Normal heart sounds.  Pulmonary:     Effort: Pulmonary effort is normal.     Breath sounds: Normal breath sounds.  Abdominal:     General: Bowel sounds are normal.     Palpations: Abdomen is soft.  Musculoskeletal:     Cervical back: Normal range of motion.  Skin:    General: Skin is warm and dry.  Neurological:     Mental Status: He is alert and oriented to person, place, and time.      CMP Latest Ref Rng & Units 04/13/2020  Glucose 70 - 99 mg/dL 220(H)  BUN 8 - 23 mg/dL 68(H)  Creatinine 0.61 - 1.24 mg/dL 1.88(H)  Sodium 135 - 145 mmol/L 140  Potassium 3.5 - 5.1 mmol/L 4.9  Chloride 98 - 111 mmol/L 104  CO2 22 - 32 mmol/L 29  Calcium 8.9 - 10.3 mg/dL 8.4(L)  Total Protein 6.5 -  8.1 g/dL 5.7(L)  Total Bilirubin 0.3 - 1.2 mg/dL 0.5  Alkaline Phos 38 - 126 U/L 49  AST 15 - 41 U/L 16  ALT 0 - 44 U/L 28   CBC Latest Ref Rng & Units 05/08/2020  WBC 4.0 - 10.5 K/uL 7.3  Hemoglobin 13.0 - 17.0 g/dL 12.6(L)  Hematocrit 39 - 52 % 38.6(L)  Platelets 150 - 400 K/uL 141(L)    No images are attached to the encounter.  MR BRAIN WO CONTRAST  Result Date: 05/10/2020 CLINICAL DATA:  Lung mass, lymphadenopathy. EXAM: MRI HEAD WITHOUT CONTRAST TECHNIQUE: Multiplanar, multiecho pulse sequences of the brain and surrounding structures were obtained without intravenous contrast. COMPARISON:  None. FINDINGS: The study was obtained without intravenous contrast due to inability to obtain venous access. Brain: No acute infarction, hemorrhage, hydrocephalus, extra-axial collection or mass lesion. Scattered and confluent foci of T2 hyperintensity are seen within the white matter of the cerebral hemispheres, nonspecific, most likely related to chronic small vessel ischemia. Remote right occipital cortical infarct. Vascular: Normal flow voids. Skull and upper cervical spine: Normal marrow signal. Sinuses/Orbits: Paranasal sinuses are  essentially clear. The orbits are maintained. Other: None. IMPRESSION: 1. The study was obtained without intravenous contrast due to inability to obtain venous access. 2. No evidence of intracranial metastatic disease. However, small metastatic lesions could be missed in the absence of contrast enhanced images. 3. Moderate chronic small vessel ischemia. Electronically Signed   By: Pedro Earls M.D.   On: 05/10/2020 14:17   NM PET Image Initial (PI) Skull Base To Thigh  Result Date: 04/23/2020 CLINICAL DATA:  Initial treatment strategy for Right upper lobe lung lesion and mediastinal and hilar adenopathy. Prior history of bladder cancer. EXAM: NUCLEAR MEDICINE PET SKULL BASE TO THIGH TECHNIQUE: 9.33 mCi F-18 FDG was injected intravenously. Full-ring PET imaging was performed from the skull base to thigh after the radiotracer. CT data was obtained and used for attenuation correction and anatomic localization. Fasting blood glucose: 122 mg/dl COMPARISON:  Chest CT 04/05/2020 FINDINGS: Mediastinal blood pool activity: SUV max 1.89 Liver activity: SUV max NA NECK: No neck mass or upper cervical adenopathy. There is an enlarged somewhat necrotic appearing left supraclavicular lymph node measuring approximately 12 mm on image 57/3. This is hypermetabolic with SUV max of 8.25. Incidental CT findings: none CHEST: Ill-defined consolidative process in the right upper lobe peripherally is hypermetabolic with SUV max of 0.53. This is certainly worrisome for infiltrating tumor. It does appear to involve the pleura and is also significant surrounding interstitial thickening which could represent interstitial spread of tumor. An infectious process is also possible but felt to be unlikely. Bulky right hilar and mediastinal lymphadenopathy is hypermetabolic and worrisome for neoplastic lymphadenopathy. 2.3 cm right paratracheal node on image 91/3 has an SUV max of 5.38. 18.5 mm right hilar node on image 107/3 has  an SUV max of 3.82. 17 mm subcarinal lymph node on image 108/3 has an SUV max of 5.31. Patchy nodular opacities at the level lung base along with extensive interstitial thickening in the left lung demonstrates areas of mild hypermetabolism. SUV max is 3.55. This appears somewhat progressive since the prior CT scan and could reflect infection or tumor. Incidental CT findings: Age advanced atherosclerotic calcifications involving the aorta and coronary arteries. Underlying emphysematous changes and interstitial lung disease. ABDOMEN/PELVIS: Abdomen left adrenal gland lesion is not hypermetabolic and is low attenuation in and consistent with a benign adenoma. No findings suspicious for metastatic disease involving  the liver. No abdominal/pelvic lymphadenopathy. Incidental CT findings: Age advanced atherosclerotic calcifications involving the aorta and branch vessels. Aortoiliac stents are noted. There is also evidence for prior fem-fem bypass graft. Small gallstone noted the gallbladder. Status post cystectomy and prostatectomy with ileal conduit. SKELETON: No significant bony findings. No evidence of metastatic bone disease. Incidental CT findings: none IMPRESSION: 1. Hypermetabolic ill-defined right upper lobe nodular irregular airspace process suspicious for infiltrating neoplasm. There is also hypermetabolic right hilar and mediastinal lymphadenopathy. Metastatic disease is also possible but seems less likely. 2. Hypermetabolic left supraclavicular lymph node may be amenable to ultrasound-guided biopsy. 3. Progressive nodular airspace process in the left lower lobe demonstrating mild hypermetabolism. Infection versus neoplasm. 4. No abdominal/pelvic metastatic disease. 5. Surgical changes from cysto prostatectomy and ileal conduit. 6. Significant age advanced vascular disease. Electronically Signed   By: Marijo Sanes M.D.   On: 04/23/2020 11:09   [place] CORE BIOPSY (LYMPH NODES)  Result Date:  05/08/2020 INDICATION: 69 year old male with recently diagnosed right upper lobe lung mass and accompanying left supraclavicular lymphadenopathy. EXAM: ULTRASOUND GUIDED core BIOPSY OF left supraclavicular lymph node MEDICATIONS: None. ANESTHESIA/SEDATION: The procedure was performed under local anesthesia only. PROCEDURE: The procedure, risks, benefits, and alternatives were explained to the patient. Questions regarding the procedure were encouraged and answered. The patient understands and consents to the procedure. The left neck was prepped with chlorhexidine in a sterile fashion, and a sterile drape was applied covering the operative field. A sterile gown and sterile gloves were used for the procedure. An appropriate window for percutaneous biopsy was obtained with ultrasound. The procedure was planned. Local anesthesia was provided with 1% Lidocaine at the planned skin entry site as well as deeper along the needle entry path. A small skin nick was made. Under ultrasound guidance, 17 gauge, 10 cm introducer needle was directed into the periphery of the lymph node. A total of 418 gauge core biopsies were obtained and placed in formalin. The samples were sent to pathology. The needle was removed and manual compression was held until hemostasis was achieved. Postprocedure ultrasound demonstrates no evidence of hematoma surrounding the lymph node or needle entry site. The patient tolerated the procedure well and was discharged home. COMPLICATIONS: None immediate. FINDINGS: Prominent left supraclavicular lymph node measuring up to 3 cm. IMPRESSION: Technically successful ultrasound-guided core biopsy of left supraclavicular lymph node. Ruthann Cancer, MD [facility] Electronically Signed   By: Ruthann Cancer MD   On: 05/08/2020 11:51     Assessment and plan- Patient is a 69 y.o. male with newly diagnosed stage IIIc adenocarcinoma of the lung cT3 N3  M0  Discussed results of PET scan as well as biopsy with the patient in detail.  Patient found to have bilateral mediastinal as well as supraclavicular adenopathy.  Supraclavicular lymph node was biopsy-proven non-small cell lung cancer adenocarcinoma.  He does not have any evidence of distant metastatic disease.  I would recommend concurrent chemoradiation with weekly carbotaxol for 7 weeks.  His hemoptysis is likely to improve after starting chemoradiation.  I would offer her carboplatin at AUC 2 and Taxol at 45 mg per metered squared.  Discussed risks and benefits of chemotherapy including all but not limited to nausea, vomiting, low blood counts, risk of infections and hospitalizations.  Risk of infusion reaction and peripheral neuropathy associated with Taxol.  Treatment will be given with a curative intent.  Patient understands and agrees to proceed as planned.  If patient has stable  disease/partial response after chemoradiation he will go on to receive maintenance durvalumab.  We will also obtain NGS testing on his specimen  I will tentatively see him in 2 weeks when he starts concurrent radiation and chemotherapy  Cancer Staging Malignant neoplasm of lung [facility]) Staging form: Lung, AJCC 8th Edition - Clinical stage from 05/08/2020: Stage IIIC (cT3, cN3, cM0) - Signed by Sindy Guadeloupe, MD on 05/19/2020    Visit Diagnosis 1. Malignant neoplasm of lung, unspecified laterality, unspecified part of lung ([place])   2. Encounter for antineoplastic chemotherapy   3. Goals of care, counseling/discussion      Dr. Randa Evens, MD, MPH [facility] at [facility] [phone] 05/19/2020 1:05 PM

## 2020-05-19 NOTE — Patient Instructions (Signed)
Paclitaxel injection What is this medicine? PACLITAXEL (PAK li TAX el) is a chemotherapy drug. It targets fast dividing cells, like cancer cells, and causes these cells to die. This medicine is used to treat ovarian cancer, breast cancer, lung cancer, Kaposi's sarcoma, and other cancers. This medicine may be used for other purposes; ask your health care provider or pharmacist if you have questions. COMMON BRAND NAME(S): Onxol, Taxol What should I tell my health care provider before I take this medicine? They need to know if you have any of these conditions:  history of irregular heartbeat  liver disease  low blood counts, like low white cell, platelet, or red cell counts  lung or breathing disease, like asthma  tingling of the fingers or toes, or other nerve disorder  an unusual or allergic reaction to paclitaxel, alcohol, polyoxyethylated castor oil, other chemotherapy, other medicines, foods, dyes, or preservatives  pregnant or trying to get pregnant  breast-feeding How should I use this medicine? This drug is given as an infusion into a vein. It is administered in a hospital or clinic by a specially trained health care professional. Talk to your pediatrician regarding the use of this medicine in children. Special care may be needed. Overdosage: If you think you have taken too much of this medicine contact a poison control center or emergency room at once. NOTE: This medicine is only for you. Do not share this medicine with others. What if I miss a dose? It is important not to miss your dose. Call your doctor or health care professional if you are unable to keep an appointment. What may interact with this medicine? Do not take this medicine with any of the following medications:  disulfiram  metronidazole This medicine may also interact with the following medications:  antiviral medicines for hepatitis, HIV or AIDS  certain antibiotics like erythromycin and  clarithromycin  certain medicines for fungal infections like ketoconazole and itraconazole  certain medicines for seizures like carbamazepine, phenobarbital, phenytoin  gemfibrozil  nefazodone  rifampin  St. John's wort This list may not describe all possible interactions. Give your health care provider a list of all the medicines, herbs, non-prescription drugs, or dietary supplements you use. Also tell them if you smoke, drink alcohol, or use illegal drugs. Some items may interact with your medicine. What should I watch for while using this medicine? Your condition will be monitored carefully while you are receiving this medicine. You will need important blood work done while you are taking this medicine. This medicine can cause serious allergic reactions. To reduce your risk you will need to take other medicine(s) before treatment with this medicine. If you experience allergic reactions like skin rash, itching or hives, swelling of the face, lips, or tongue, tell your doctor or health care professional right away. In some cases, you may be given additional medicines to help with side effects. Follow all directions for their use. This drug may make you feel generally unwell. This is not uncommon, as chemotherapy can affect healthy cells as well as cancer cells. Report any side effects. Continue your course of treatment even though you feel ill unless your doctor tells you to stop. Call your doctor or health care professional for advice if you get a fever, chills or sore throat, or other symptoms of a cold or flu. Do not treat yourself. This drug decreases your body's ability to fight infections. Try to avoid being around people who are sick. This medicine may increase your risk to bruise  or bleed. Call your doctor or health care professional if you notice any unusual bleeding. Be careful brushing and flossing your teeth or using a toothpick because you may get an infection or bleed more easily.  If you have any dental work done, tell your dentist you are receiving this medicine. Avoid taking products that contain aspirin, acetaminophen, ibuprofen, naproxen, or ketoprofen unless instructed by your doctor. These medicines may hide a fever. Do not become pregnant while taking this medicine. Women should inform their doctor if they wish to become pregnant or think they might be pregnant. There is a potential for serious side effects to an unborn child. Talk to your health care professional or pharmacist for more information. Do not breast-feed an infant while taking this medicine. Men are advised not to father a child while receiving this medicine. This product may contain alcohol. Ask your pharmacist or healthcare provider if this medicine contains alcohol. Be sure to tell all healthcare providers you are taking this medicine. Certain medicines, like metronidazole and disulfiram, can cause an unpleasant reaction when taken with alcohol. The reaction includes flushing, headache, nausea, vomiting, sweating, and increased thirst. The reaction can last from 30 minutes to several hours. What side effects may I notice from receiving this medicine? Side effects that you should report to your doctor or health care professional as soon as possible:  allergic reactions like skin rash, itching or hives, swelling of the face, lips, or tongue  breathing problems  changes in vision  fast, irregular heartbeat  high or low blood pressure  mouth sores  pain, tingling, numbness in the hands or feet  signs of decreased platelets or bleeding - bruising, pinpoint red spots on the skin, black, tarry stools, blood in the urine  signs of decreased red blood cells - unusually weak or tired, feeling faint or lightheaded, falls  signs of infection - fever or chills, cough, sore throat, pain or difficulty passing urine  signs and symptoms of liver injury like dark yellow or brown urine; general ill feeling or  flu-like symptoms; light-colored stools; loss of appetite; nausea; right upper belly pain; unusually weak or tired; yellowing of the eyes or skin  swelling of the ankles, feet, hands  unusually slow heartbeat Side effects that usually do not require medical attention (report to your doctor or health care professional if they continue or are bothersome):  diarrhea  hair loss  loss of appetite  muscle or joint pain  nausea, vomiting  pain, redness, or irritation at site where injected  tiredness This list may not describe all possible side effects. Call your doctor for medical advice about side effects. You may report side effects to FDA at 1-800-FDA-1088. Where should I keep my medicine? This drug is given in a hospital or clinic and will not be stored at home. NOTE: This sheet is a summary. It may not cover all possible information. If you have questions about this medicine, talk to your doctor, pharmacist, or health care provider.  2020 Elsevier/Gold Standard (2017-03-15 13:14:55) Carboplatin injection What is this medicine? CARBOPLATIN (KAR boe pla tin) is a chemotherapy drug. It targets fast dividing cells, like cancer cells, and causes these cells to die. This medicine is used to treat ovarian cancer and many other cancers. This medicine may be used for other purposes; ask your health care provider or pharmacist if you have questions. COMMON BRAND NAME(S): Paraplatin What should I tell my health care provider before I take this medicine? They need to  know if you have any of these conditions:  blood disorders  hearing problems  kidney disease  recent or ongoing radiation therapy  an unusual or allergic reaction to carboplatin, cisplatin, other chemotherapy, other medicines, foods, dyes, or preservatives  pregnant or trying to get pregnant  breast-feeding How should I use this medicine? This drug is usually given as an infusion into a vein. It is administered in a  hospital or clinic by a specially trained health care professional. Talk to your pediatrician regarding the use of this medicine in children. Special care may be needed. Overdosage: If you think you have taken too much of this medicine contact a poison control center or emergency room at once. NOTE: This medicine is only for you. Do not share this medicine with others. What if I miss a dose? It is important not to miss a dose. Call your doctor or health care professional if you are unable to keep an appointment. What may interact with this medicine?  medicines for seizures  medicines to increase blood counts like filgrastim, pegfilgrastim, sargramostim  some antibiotics like amikacin, gentamicin, neomycin, streptomycin, tobramycin  vaccines Talk to your doctor or health care professional before taking any of these medicines:  acetaminophen  aspirin  ibuprofen  ketoprofen  naproxen This list may not describe all possible interactions. Give your health care provider a list of all the medicines, herbs, non-prescription drugs, or dietary supplements you use. Also tell them if you smoke, drink alcohol, or use illegal drugs. Some items may interact with your medicine. What should I watch for while using this medicine? Your condition will be monitored carefully while you are receiving this medicine. You will need important blood work done while you are taking this medicine. This drug may make you feel generally unwell. This is not uncommon, as chemotherapy can affect healthy cells as well as cancer cells. Report any side effects. Continue your course of treatment even though you feel ill unless your doctor tells you to stop. In some cases, you may be given additional medicines to help with side effects. Follow all directions for their use. Call your doctor or health care professional for advice if you get a fever, chills or sore throat, or other symptoms of a cold or flu. Do not treat  yourself. This drug decreases your body's ability to fight infections. Try to avoid being around people who are sick. This medicine may increase your risk to bruise or bleed. Call your doctor or health care professional if you notice any unusual bleeding. Be careful brushing and flossing your teeth or using a toothpick because you may get an infection or bleed more easily. If you have any dental work done, tell your dentist you are receiving this medicine. Avoid taking products that contain aspirin, acetaminophen, ibuprofen, naproxen, or ketoprofen unless instructed by your doctor. These medicines may hide a fever. Do not become pregnant while taking this medicine. Women should inform their doctor if they wish to become pregnant or think they might be pregnant. There is a potential for serious side effects to an unborn child. Talk to your health care professional or pharmacist for more information. Do not breast-feed an infant while taking this medicine. What side effects may I notice from receiving this medicine? Side effects that you should report to your doctor or health care professional as soon as possible:  allergic reactions like skin rash, itching or hives, swelling of the face, lips, or tongue  signs of infection - fever or  chills, cough, sore throat, pain or difficulty passing urine  signs of decreased platelets or bleeding - bruising, pinpoint red spots on the skin, black, tarry stools, nosebleeds  signs of decreased red blood cells - unusually weak or tired, fainting spells, lightheadedness  breathing problems  changes in hearing  changes in vision  chest pain  high blood pressure  low blood counts - This drug may decrease the number of white blood cells, red blood cells and platelets. You may be at increased risk for infections and bleeding.  nausea and vomiting  pain, swelling, redness or irritation at the injection site  pain, tingling, numbness in the hands or  feet  problems with balance, talking, walking  trouble passing urine or change in the amount of urine Side effects that usually do not require medical attention (report to your doctor or health care professional if they continue or are bothersome):  hair loss  loss of appetite  metallic taste in the mouth or changes in taste This list may not describe all possible side effects. Call your doctor for medical advice about side effects. You may report side effects to FDA at 1-800-FDA-1088. Where should I keep my medicine? This drug is given in a hospital or clinic and will not be stored at home. NOTE: This sheet is a summary. It may not cover all possible information. If you have questions about this medicine, talk to your doctor, pharmacist, or health care provider.  2020 Elsevier/Gold Standard (2007-10-17 14:38:05)

## 2020-05-19 NOTE — Consult Note (Signed)
NEW PATIENT EVALUATION  Name: Martin Jennings  MRN: [id number]  Date:   05/19/2020     DOB: May 10, 1951   This 68 y.o. male patient presents to the clinic for initial evaluation of stage IIIc (T3 N3 M0) adenocarcinoma of the right upper lobe.  REFERRING PHYSICIAN: Perrin Maltese, MD  CHIEF COMPLAINT:  Chief Complaint  Patient presents with  . Lung Cancer    DIAGNOSIS: The encounter diagnosis was Malignant neoplasm of hilus of lung, unspecified laterality ([place]).   PREVIOUS INVESTIGATIONS:  CT scans PET CT scans and MRI of brain reviewed Pathology report reviewed Clinical notes reviewed  HPI: Patient is a 69 year old male chronic smoker who presented with acute onset of shortness of breath and found to have Covid pneumonia.  CT scan on time of admission showed extensive mediastinal as well as bilateral hilar adenopathy.  He had a masslike opacity in the right upper lobe measuring 5.7 x 2.9 x 2.3 cm.  PET CT scan showed consolidative chronic process in the right upper lobe suspicious for infiltrative process consistent with neoplasm.  There was also hypermetabolic right hilar and mediastinal adenopathy also some hypermetabolic activity in left supraclavicular lymph node.  He underwent left supraclavicular lymph node biopsy which was positive for non-small cell lung cancer with special stains consistent with adenocarcinoma.  Patient has been having some hemoptysis over the past 3 weeks.  Does have a mild slightly productive cough.  He does have dyspnea exertion and some chronic shortness of breath.  He has been seen by medical oncology with recommendations for concurrent chemoradiation.  He is seen today for radiation oncology evaluation.  He does have some infiltrative process in his left lower lobe not significantly hypermetabolic may be residual from his Covid pneumonia.  MRI of his brain showed no evidence to suggest metastatic disease.  PLANNED TREATMENT REGIMEN: Concurrent  chemoradiation  PAST MEDICAL HISTORY:  has a past medical history of Bladder cancer ([place]) (2012), Hypertension, and Peripheral vascular disease ([place]).    PAST SURGICAL HISTORY:  Past Surgical History:  Procedure Laterality Date  . BLADDER REMOVAL    . LOWER EXTREMITY ANGIOGRAPHY Left 04/12/2017   Procedure: Lower Extremity Angiography;  Surgeon: Katha Cabal, MD;  Location: [facility];  Service: Cardiovascular;  Laterality: Left;  . uretostomy    . VASCULAR SURGERY      FAMILY HISTORY: family history is not on file.  SOCIAL HISTORY:  reports that he quit smoking about 2 months ago. His smoking use included cigarettes. He has a 62.50 pack-year smoking history. He has never used smokeless tobacco. He reports previous drug use. Drug: Marijuana. He reports that he does not drink alcohol.  ALLERGIES: Patient has no known allergies.  MEDICATIONS:  Current Outpatient Medications  Medication Sig Dispense Refill  . amLODipine (NORVASC) 10 MG tablet Take 10 mg by mouth daily.     Marland Kitchen atorvastatin (LIPITOR) 20 MG tablet Take 20 mg by mouth every evening.     . clopidogrel (PLAVIX) 75 MG tablet Take 75 mg by mouth daily.     Marland Kitchen aspirin EC 81 MG tablet Take 81 mg by mouth daily.  (Patient not taking: Reported on 05/15/2020)     No current facility-administered medications for this encounter.    ECOG PERFORMANCE STATUS:  1 - Symptomatic but completely ambulatory  REVIEW OF SYSTEMS: Patient is status post cystectomy for bladder cancer back approximately 2007 Patient denies any weight loss, fatigue, weakness, fever, chills or night sweats. Patient  denies any loss of vision, blurred vision. Patient denies any ringing  of the ears or hearing loss. No irregular heartbeat. Patient denies heart murmur or history of fainting. Patient denies any chest pain or pain radiating to her upper extremities. Patient denies any shortness of breath, difficulty breathing at night, cough or hemoptysis.  Patient denies any swelling in the lower legs. Patient denies any nausea vomiting, vomiting of blood, or coffee ground material in the vomitus. Patient denies any stomach pain. Patient states has had normal bowel movements no significant constipation or diarrhea. Patient denies any dysuria, hematuria or significant nocturia. Patient denies any problems walking, swelling in the joints or loss of balance. Patient denies any skin changes, loss of hair or loss of weight. Patient denies any excessive worrying or anxiety or significant depression. Patient denies any problems with insomnia. Patient denies excessive thirst, polyuria, polydipsia. Patient denies any swollen glands, patient denies easy bruising or easy bleeding. Patient denies any recent infections, allergies or URI. Patient "s visual fields have not changed significantly in recent time.   PHYSICAL EXAM: BP 116/69 (BP Location: Left Arm, Patient Position: Sitting, Cuff Size: Normal)   Pulse 94   Temp (!) 97.3 F (36.3 C) (Tympanic)   Resp 20   Wt 170 lb 8 oz (77.3 kg)   BMI 23.78 kg/m  Well-developed well-nourished patient in NAD. HEENT reveals PERLA, EOMI, discs not visualized.  Oral cavity is clear. No oral mucosal lesions are identified. Neck is clear without evidence of cervical or supraclavicular adenopathy. Lungs are clear to A&P. Cardiac examination is essentially unremarkable with regular rate and rhythm without murmur rub or thrill. Abdomen is benign with no organomegaly or masses noted. Motor sensory and DTR levels are equal and symmetric in the upper and lower extremities. Cranial nerves II through XII are grossly intact. Proprioception is intact. No peripheral adenopathy or edema is identified. No motor or sensory levels are noted. Crude visual fields are within normal range.  LABORATORY DATA: Pathology report reviewed    RADIOLOGY RESULTS: CT scan PET CT scan MRI of brain reviewed   IMPRESSION: Stage IIIc adenocarcinoma the  right upper lobe in 69 year old male for concurrent chemoradiation  PLAN: At this time I like to go ahead with radiation therapy with concurrent chemotherapy I will plan on delivering 11 Gray to his areas of hypermetabolic activity.  I would use PET CT fusion study for his treatment.  I would use IMRT treatment planning and delivery since multiple sites are involved including left supraclavicular region mediastinal hilar nodes as well as the right upper lobe lesion.  Risks and benefits of treatment including skin reaction fatigue alteration of blood counts possible radiation esophagitis skin reaction all were described in detail to the patient.  He seems to comprehend my treatment plan well.  There will be extra effort by both professional staff as well as technical staff to coordinate and manage concurrent chemoradiation and ensuing side effects during his treatments. I have personally set up and ordered CT simulation for tomorrow.  We will coordinate with chemotherapy.  He has a port placement on this Thursday.  I would like to take this opportunity to thank you for allowing me to participate in the care of your patient.Noreene Filbert, MD

## 2020-05-19 NOTE — Progress Notes (Signed)
START ON PATHWAY REGIMEN - Non-Small Cell Lung     Administer weekly:     Paclitaxel      Carboplatin   **Always confirm dose/schedule in your pharmacy ordering system**  Patient Characteristics: Preoperative or Nonsurgical Candidate (Clinical Staging), Stage III - Nonsurgical Candidate (Nonsquamous and Squamous), PS = 0, 1 Therapeutic Status: Preoperative or Nonsurgical Candidate (Clinical Staging) AJCC T Category: cT3 AJCC N Category: cN3 AJCC M Category: cM0 AJCC 8 Stage Grouping: IIIC ECOG Performance Status: 1 Intent of Therapy: Curative Intent, Discussed with Patient

## 2020-05-19 NOTE — Progress Notes (Signed)
Hematology/Oncology Consult note [facility]  Telephone:([phone] [phone]  Patient Care Team: Perrin Maltese, MD as PCP - General (Internal Medicine) Telford Nab, RN as Oncology Nurse Navigator   Name of the patient: Martin Jennings  [id number]  [birth year]   Date of visit: 05/19/20  Diagnosis-stage IIIc adenocarcinoma of the lung cT3 cN3 M0  Chief complaint/ Reason for visit-discuss final pathology results and further management  Heme/Onc history: Patient is a 69 year old male with a history of tobacco dependence who was admitted to the hospital for symptoms of acute onset shortness of breath and was found to have Covid pneumonia.  He underwent CT angio chest which showed extensive mediastinal as well as bilateral hilar adenopathy.  Masslike opacity with architectural distortion involving the right upper lobe measuring 5.7 x 2.9 x 2.3 cm and groundglass opacities bilaterally.  Mass involving the left adrenal gland as well as a smaller right adrenal gland possibly indicating adrenal metastases.    This was followed by a PET CT scan 2 weeks later which showed the consolidative process in the right upper lobe measuring 7.88 concerning for infiltrating tumor.  It appears to involve the pleura associated interstitial thickening was also seen.  Bulky right hilar and mediastinal adenopathy.  Enlarged somewhat necrotic appearing left supraclavicular lymph node was also hypermetabolic.  Adrenal gland was not hypermetabolic and was consistent with a benign adenoma.  No metastatic disease involving liver.  No features of distant metastatic disease.  Pathology showed metastatic non-small cell lung cancer which was TTF-1 positive consistent with adenocarcinoma.  Interval history-patient is here by himself today.  He reports some fatigue and occasional episodes of hemoptysis.  ECOG PS- 1 Pain scale- 0 Opioid associated constipation- no  Review of systems-  Review of Systems  Constitutional: Positive for malaise/fatigue. Negative for chills, fever and weight loss.  HENT: Negative for congestion, ear discharge and nosebleeds.   Eyes: Negative for blurred vision.  Respiratory: Positive for hemoptysis. Negative for cough, sputum production, shortness of breath and wheezing.   Cardiovascular: Negative for chest pain, palpitations, orthopnea and claudication.  Gastrointestinal: Negative for abdominal pain, blood in stool, constipation, diarrhea, heartburn, melena, nausea and vomiting.  Genitourinary: Negative for dysuria, flank pain, frequency, hematuria and urgency.  Musculoskeletal: Negative for back pain, joint pain and myalgias.  Skin: Negative for rash.  Neurological: Negative for dizziness, tingling, focal weakness, seizures, weakness and headaches.  Endo/Heme/Allergies: Does not bruise/bleed easily.  Psychiatric/Behavioral: Negative for depression and suicidal ideas. The patient does not have insomnia.        No Known Allergies   Past Medical History:  Diagnosis Date  . Bladder cancer ([place]) 2012  . Hypertension   . Peripheral vascular disease [facility])      Past Surgical History:  Procedure Laterality Date  . BLADDER REMOVAL    . LOWER EXTREMITY ANGIOGRAPHY Left 04/12/2017   Procedure: Lower Extremity Angiography;  Surgeon: Katha Cabal, MD;  Location: [facility];  Service: Cardiovascular;  Laterality: Left;  . uretostomy    . VASCULAR SURGERY      Social History   Socioeconomic History  . Marital status: Married    Spouse name: Not on file  . Number of children: Not on file  . Years of education: Not on file  . Highest education level: Not on file  Occupational History  . Not on file  Tobacco Use  . Smoking status: Former Smoker    Packs/day: 1.25  Years: 50.00    Pack years: 62.50    Types: Cigarettes    Quit date: 03/13/2020    Years since quitting: 0.1  . Smokeless tobacco: Never Used  Vaping Use   . Vaping Use: Never used  Substance and Sexual Activity  . Alcohol use: No  . Drug use: Not Currently    Types: Marijuana  . Sexual activity: Not Currently  Other Topics Concern  . Not on file  Social History Narrative  . Not on file   Social Determinants of Health   Financial Resource Strain:   . Difficulty of Paying Living Expenses: Not on file  Food Insecurity:   . Worried About Charity fundraiser in the Last Year: Not on file  . Ran Out of Food in the Last Year: Not on file  Transportation Needs:   . Lack of Transportation (Medical): Not on file  . Lack of Transportation (Non-Medical): Not on file  Physical Activity:   . Days of Exercise per Week: Not on file  . Minutes of Exercise per Session: Not on file  Stress:   . Feeling of Stress : Not on file  Social Connections:   . Frequency of Communication with Friends and Family: Not on file  . Frequency of Social Gatherings with Friends and Family: Not on file  . Attends Religious Services: Not on file  . Active Member of Clubs or Organizations: Not on file  . Attends Archivist Meetings: Not on file  . Marital Status: Not on file  Intimate Partner Violence:   . Fear of Current or Ex-Partner: Not on file  . Emotionally Abused: Not on file  . Physically Abused: Not on file  . Sexually Abused: Not on file    History reviewed. No pertinent family history.   Current Outpatient Medications:  .  amLODipine (NORVASC) 10 MG tablet, Take 10 mg by mouth daily. , Disp: , Rfl:  .  atorvastatin (LIPITOR) 20 MG tablet, Take 20 mg by mouth every evening. , Disp: , Rfl:  .  clopidogrel (PLAVIX) 75 MG tablet, Take 75 mg by mouth daily. , Disp: , Rfl:  .  aspirin EC 81 MG tablet, Take 81 mg by mouth daily.  (Patient not taking: Reported on 05/15/2020), Disp: , Rfl:   Physical exam:  Vitals:   05/15/20 1049  BP: 125/70  Pulse: 84  Resp: 16  Temp: 98.6 F (37 C)  TempSrc: Oral   Physical Exam Cardiovascular:      Rate and Rhythm: Normal rate and regular rhythm.     Heart sounds: Normal heart sounds.  Pulmonary:     Effort: Pulmonary effort is normal.     Breath sounds: Normal breath sounds.  Abdominal:     General: Bowel sounds are normal.     Palpations: Abdomen is soft.  Musculoskeletal:     Cervical back: Normal range of motion.  Skin:    General: Skin is warm and dry.  Neurological:     Mental Status: He is alert and oriented to person, place, and time.      CMP Latest Ref Rng & Units 04/13/2020  Glucose 70 - 99 mg/dL 220(H)  BUN 8 - 23 mg/dL 68(H)  Creatinine 0.61 - 1.24 mg/dL 1.88(H)  Sodium 135 - 145 mmol/L 140  Potassium 3.5 - 5.1 mmol/L 4.9  Chloride 98 - 111 mmol/L 104  CO2 22 - 32 mmol/L 29  Calcium 8.9 - 10.3 mg/dL 8.4(L)  Total Protein 6.5 -  8.1 g/dL 5.7(L)  Total Bilirubin 0.3 - 1.2 mg/dL 0.5  Alkaline Phos 38 - 126 U/L 49  AST 15 - 41 U/L 16  ALT 0 - 44 U/L 28   CBC Latest Ref Rng & Units 05/08/2020  WBC 4.0 - 10.5 K/uL 7.3  Hemoglobin 13.0 - 17.0 g/dL 12.6(L)  Hematocrit 39 - 52 % 38.6(L)  Platelets 150 - 400 K/uL 141(L)    No images are attached to the encounter.  MR BRAIN WO CONTRAST  Result Date: 05/10/2020 CLINICAL DATA:  Lung mass, lymphadenopathy. EXAM: MRI HEAD WITHOUT CONTRAST TECHNIQUE: Multiplanar, multiecho pulse sequences of the brain and surrounding structures were obtained without intravenous contrast. COMPARISON:  None. FINDINGS: The study was obtained without intravenous contrast due to inability to obtain venous access. Brain: No acute infarction, hemorrhage, hydrocephalus, extra-axial collection or mass lesion. Scattered and confluent foci of T2 hyperintensity are seen within the white matter of the cerebral hemispheres, nonspecific, most likely related to chronic small vessel ischemia. Remote right occipital cortical infarct. Vascular: Normal flow voids. Skull and upper cervical spine: Normal marrow signal. Sinuses/Orbits: Paranasal sinuses are  essentially clear. The orbits are maintained. Other: None. IMPRESSION: 1. The study was obtained without intravenous contrast due to inability to obtain venous access. 2. No evidence of intracranial metastatic disease. However, small metastatic lesions could be missed in the absence of contrast enhanced images. 3. Moderate chronic small vessel ischemia. Electronically Signed   By: Pedro Earls M.D.   On: 05/10/2020 14:17   NM PET Image Initial (PI) Skull Base To Thigh  Result Date: 04/23/2020 CLINICAL DATA:  Initial treatment strategy for Right upper lobe lung lesion and mediastinal and hilar adenopathy. Prior history of bladder cancer. EXAM: NUCLEAR MEDICINE PET SKULL BASE TO THIGH TECHNIQUE: 9.33 mCi F-18 FDG was injected intravenously. Full-ring PET imaging was performed from the skull base to thigh after the radiotracer. CT data was obtained and used for attenuation correction and anatomic localization. Fasting blood glucose: 122 mg/dl COMPARISON:  Chest CT 04/05/2020 FINDINGS: Mediastinal blood pool activity: SUV max 1.89 Liver activity: SUV max NA NECK: No neck mass or upper cervical adenopathy. There is an enlarged somewhat necrotic appearing left supraclavicular lymph node measuring approximately 12 mm on image 57/3. This is hypermetabolic with SUV max of 5.46. Incidental CT findings: none CHEST: Ill-defined consolidative process in the right upper lobe peripherally is hypermetabolic with SUV max of 2.70. This is certainly worrisome for infiltrating tumor. It does appear to involve the pleura and is also significant surrounding interstitial thickening which could represent interstitial spread of tumor. An infectious process is also possible but felt to be unlikely. Bulky right hilar and mediastinal lymphadenopathy is hypermetabolic and worrisome for neoplastic lymphadenopathy. 2.3 cm right paratracheal node on image 91/3 has an SUV max of 5.38. 18.5 mm right hilar node on image 107/3 has  an SUV max of 3.82. 17 mm subcarinal lymph node on image 108/3 has an SUV max of 5.31. Patchy nodular opacities at the level lung base along with extensive interstitial thickening in the left lung demonstrates areas of mild hypermetabolism. SUV max is 3.55. This appears somewhat progressive since the prior CT scan and could reflect infection or tumor. Incidental CT findings: Age advanced atherosclerotic calcifications involving the aorta and coronary arteries. Underlying emphysematous changes and interstitial lung disease. ABDOMEN/PELVIS: Abdomen left adrenal gland lesion is not hypermetabolic and is low attenuation in and consistent with a benign adenoma. No findings suspicious for metastatic disease involving  the liver. No abdominal/pelvic lymphadenopathy. Incidental CT findings: Age advanced atherosclerotic calcifications involving the aorta and branch vessels. Aortoiliac stents are noted. There is also evidence for prior fem-fem bypass graft. Small gallstone noted the gallbladder. Status post cystectomy and prostatectomy with ileal conduit. SKELETON: No significant bony findings. No evidence of metastatic bone disease. Incidental CT findings: none IMPRESSION: 1. Hypermetabolic ill-defined right upper lobe nodular irregular airspace process suspicious for infiltrating neoplasm. There is also hypermetabolic right hilar and mediastinal lymphadenopathy. Metastatic disease is also possible but seems less likely. 2. Hypermetabolic left supraclavicular lymph node may be amenable to ultrasound-guided biopsy. 3. Progressive nodular airspace process in the left lower lobe demonstrating mild hypermetabolism. Infection versus neoplasm. 4. No abdominal/pelvic metastatic disease. 5. Surgical changes from cysto prostatectomy and ileal conduit. 6. Significant age advanced vascular disease. Electronically Signed   By: Marijo Sanes M.D.   On: 04/23/2020 11:09   [place] CORE BIOPSY (LYMPH NODES)  Result Date:  05/08/2020 INDICATION: 69 year old male with recently diagnosed right upper lobe lung mass and accompanying left supraclavicular lymphadenopathy. EXAM: ULTRASOUND GUIDED core BIOPSY OF left supraclavicular lymph node MEDICATIONS: None. ANESTHESIA/SEDATION: The procedure was performed under local anesthesia only. PROCEDURE: The procedure, risks, benefits, and alternatives were explained to the patient. Questions regarding the procedure were encouraged and answered. The patient understands and consents to the procedure. The left neck was prepped with chlorhexidine in a sterile fashion, and a sterile drape was applied covering the operative field. A sterile gown and sterile gloves were used for the procedure. An appropriate window for percutaneous biopsy was obtained with ultrasound. The procedure was planned. Local anesthesia was provided with 1% Lidocaine at the planned skin entry site as well as deeper along the needle entry path. A small skin nick was made. Under ultrasound guidance, 17 gauge, 10 cm introducer needle was directed into the periphery of the lymph node. A total of 418 gauge core biopsies were obtained and placed in formalin. The samples were sent to pathology. The needle was removed and manual compression was held until hemostasis was achieved. Postprocedure ultrasound demonstrates no evidence of hematoma surrounding the lymph node or needle entry site. The patient tolerated the procedure well and was discharged home. COMPLICATIONS: None immediate. FINDINGS: Prominent left supraclavicular lymph node measuring up to 3 cm. IMPRESSION: Technically successful ultrasound-guided core biopsy of left supraclavicular lymph node. Ruthann Cancer, MD [facility] Electronically Signed   By: Ruthann Cancer MD   On: 05/08/2020 11:51     Assessment and plan- Patient is a 69 y.o. male with newly diagnosed stage IIIc adenocarcinoma of the lung cT3 N3  M0  Discussed results of PET scan as well as biopsy with the patient in detail.  Patient found to have bilateral mediastinal as well as supraclavicular adenopathy.  Supraclavicular lymph node was biopsy-proven non-small cell lung cancer adenocarcinoma.  He does not have any evidence of distant metastatic disease.  I would recommend concurrent chemoradiation with weekly carbotaxol for 7 weeks.  His hemoptysis is likely to improve after starting chemoradiation.  I would offer her carboplatin at AUC 2 and Taxol at 45 mg per metered squared.  Discussed risks and benefits of chemotherapy including all but not limited to nausea, vomiting, low blood counts, risk of infections and hospitalizations.  Risk of infusion reaction and peripheral neuropathy associated with Taxol.  Treatment will be given with a curative intent.  Patient understands and agrees to proceed as planned.  If patient has stable  disease/partial response after chemoradiation he will go on to receive maintenance durvalumab.  We will also obtain NGS testing on his specimen  I will tentatively see him in 2 weeks when he starts concurrent radiation and chemotherapy  Cancer Staging Malignant neoplasm of lung [facility]) Staging form: Lung, AJCC 8th Edition - Clinical stage from 05/08/2020: Stage IIIC (cT3, cN3, cM0) - Signed by Sindy Guadeloupe, MD on 05/19/2020    Visit Diagnosis 1. Malignant neoplasm of lung, unspecified laterality, unspecified part of lung ([place])   2. Encounter for antineoplastic chemotherapy   3. Goals of care, counseling/discussion      Dr. Randa Evens, MD, MPH [facility] [phone] 05/19/2020 1:05 PM

## 2020-05-19 NOTE — Progress Notes (Signed)
Pharmacist Chemotherapy Monitoring - Initial Assessment    Anticipated start date: 05/26/20  Regimen:  . Are orders appropriate based on the patient's diagnosis, regimen, and cycle? Yes . Does the plan date match the patient's scheduled date? Yes . Is the sequencing of drugs appropriate? Yes . Are the premedications appropriate for the patient's regimen? Yes . Prior Authorization for treatment is: Pending o If applicable, is the correct biosimilar selected based on the patient's insurance? not applicable  Organ Function and Labs: Marland Kitchen Are dose adjustments needed based on the patient's renal function, hepatic function, or hematologic function? No . Are appropriate labs ordered prior to the start of patient's treatment? Yes . Other organ system assessment, if indicated: N/A . The following baseline labs, if indicated, have been ordered: N/A  Dose Assessment: . Are the drug doses appropriate? Yes . Are the following correct: o Drug concentrations Yes o IV fluid compatible with drug Yes o Administration routes Yes o Timing of therapy Yes . If applicable, does the patient have documented access for treatment and/or plans for port-a-cath placement? no . If applicable, have lifetime cumulative doses been properly documented and assessed? yes Lifetime Dose Tracking  No doses have been documented on this patient for the following tracked chemicals: Doxorubicin, Epirubicin, Idarubicin, Daunorubicin, Mitoxantrone, Bleomycin, Oxaliplatin, Carboplatin, Liposomal Doxorubicin  o   Toxicity Monitoring/Prevention: . The patient has the following take home antiemetics prescribed: Ondansetron, Prochlorperazine, Dexamethasone and Lorazepam . The patient has the following take home medications prescribed: N/A . Medication allergies and previous infusion related reactions, if applicable, have been reviewed and addressed. Yes . The patient's current medication list has been assessed for drug-drug  interactions with their chemotherapy regimen. no significant drug-drug interactions were identified on review.  Order Review: . Are the treatment plan orders signed? No . Is the patient scheduled to see a provider prior to their treatment? Yes  I verify that I have reviewed each item in the above checklist and answered each question accordingly.  Adelina Mings 05/19/2020 2:19 PM

## 2020-05-20 ENCOUNTER — Ambulatory Visit
Admission: RE | Admit: 2020-05-20 | Discharge: 2020-05-20 | Disposition: A | Discharge status: Home / Self Care | Payer: Medicare Other | Source: Ambulatory Visit | Attending: Radiation Oncology | Admitting: Radiation Oncology

## 2020-05-20 ENCOUNTER — Inpatient Hospital Stay (HOSPITAL_BASED_OUTPATIENT_CLINIC_OR_DEPARTMENT_OTHER): Payer: Medicare Other | Admitting: Nurse Practitioner

## 2020-05-20 ENCOUNTER — Encounter: Payer: Self-pay | Admitting: Nurse Practitioner

## 2020-05-20 ENCOUNTER — Inpatient Hospital Stay: Payer: Medicare Other

## 2020-05-20 DIAGNOSIS — C3411 Malignant neoplasm of upper lobe, right bronchus or lung: Secondary | ICD-10-CM | POA: Diagnosis not present

## 2020-05-20 DIAGNOSIS — Z510 Encounter for antineoplastic radiation therapy: Secondary | ICD-10-CM | POA: Diagnosis not present

## 2020-05-20 DIAGNOSIS — C3490 Malignant neoplasm of unspecified part of unspecified bronchus or lung: Secondary | ICD-10-CM | POA: Diagnosis not present

## 2020-05-20 NOTE — Progress Notes (Signed)
[place]  [phone] [phone]  Patient Care Team: Perrin Maltese, MD as PCP - General (Internal Medicine) Telford Nab, RN as Oncology Nurse Navigator   Name of the patient: Martin Jennings  [id number]  February 18, 1951   Date of visit: 05/20/20  Diagnosis-adenocarcinoma of the lung.  Chief complaint/Reason for visit- Initial Meeting for [facility], preparing for starting chemotherapy  Heme/Onc history:   Patient is a 69 year old male with a history of tobacco dependence who was admitted to the hospital for symptoms of acute onset shortness of breath and was found to have Covid pneumonia. He underwent CT angio chest which showed extensive mediastinal as well as bilateral hilar adenopathy. Masslike opacity with architectural distortion involving the right upper lobe measuring 5.7 x 2.9 x 2.3 cm and groundglass opacities bilaterally. Mass involving the left adrenal gland as well as a smaller right adrenal gland possibly indicating adrenal metastases.  This was followed by a PET CT scan 2 weeks later which showed the consolidative process in the right upper lobe measuring 7.88 concerning for infiltrating tumor. It appears to involve the pleura associated interstitial thickening was also seen. Bulky right hilar and mediastinal adenopathy. Enlarged somewhat necrotic appearing left supraclavicular lymph node was also hypermetabolic. Adrenal gland was not hypermetabolic and was consistent with a benign adenoma. No metastatic disease involving liver. No features of distant metastatic disease.  Pathology showed metastatic non-small cell lung cancer which was TTF-1 positive consistent with adenocarcinoma.  He saw medical oncology, Dr. Janese Banks who recommended concurrent chemoradiation with weekly carbotaxol x 7 weeks. Treatment given with curative intent. Consider durvalumab for maintenance following. NGS testing  pending.  Oncology History  Malignant neoplasm of lung ([place])  05/08/2020 Cancer Staging   Staging form: Lung, AJCC 8th Edition - Clinical stage from 05/08/2020: Stage IIIC (cT3, cN3, cM0) - Signed by Sindy Guadeloupe, MD on 05/19/2020   05/19/2020 Initial Diagnosis   Malignant neoplasm of lung ([place])   05/26/2020 -  Chemotherapy   The patient had dexamethasone (DECADRON) 4 MG tablet, 8 mg, Oral, Daily, 1 of 1 cycle, Start date: 05/19/2020, End date: -- palonosetron (ALOXI) injection 0.25 mg, 0.25 mg, Intravenous,  Once, 0 of 4 cycles CARBOplatin (PARAPLATIN) in sodium chloride 0.9 % 100 mL chemo infusion, , Intravenous,  Once, 0 of 4 cycles PACLitaxel (TAXOL) 90 mg in sodium chloride 0.9 % 250 mL chemo infusion (</= 80mg /m2), 45 mg/m2, Intravenous,  Once, 0 of 4 cycles  for chemotherapy treatment.      Interval history-  Martin Jennings, 69 year old male, who presents to chemo care clinic today for initial meeting in preparation for starting chemotherapy. I introduced the chemo care clinic and we discussed that the role of the clinic is to assist those who are at an increased risk of emergency room visits and/or complications during the course of chemotherapy treatment. We discussed that the increased risk takes into account factors such as age, performance status, and co-morbidities. We also discussed that for some, this might include barriers to care such as not having a primary care provider, lack of insurance/transportation, or not being able to afford medications. We discussed that the goal of the program is to help prevent unplanned ER visits and help reduce complications during chemotherapy. We do this by discussing specific risk factors to each individual and identifying ways that we can help improve these risk factors and reduce barriers to care.   Review of systems-  Review of Systems  Constitutional: Positive for malaise/fatigue. Negative for chills, fever and weight loss.  HENT: Negative  for hearing loss, nosebleeds, sore throat and tinnitus.   Eyes: Negative for blurred vision and double vision.  Respiratory: Positive for hemoptysis. Negative for cough, shortness of breath and wheezing.   Cardiovascular: Negative for chest pain, palpitations and leg swelling.  Gastrointestinal: Negative for abdominal pain, blood in stool, constipation, diarrhea, melena, nausea and vomiting.  Genitourinary: Negative for dysuria and urgency.  Musculoskeletal: Negative for back pain, falls, joint pain and myalgias.  Skin: Negative for itching and rash.  Neurological: Negative for dizziness, tingling, sensory change, loss of consciousness, weakness and headaches.  Endo/Heme/Allergies: Negative for environmental allergies. Does not bruise/bleed easily.  Psychiatric/Behavioral: Negative for depression. The patient is not nervous/anxious and does not have insomnia.     No Known Allergies  Past Medical History:  Diagnosis Date  . Bladder cancer ([place]) 2012  . Hypertension   . Peripheral vascular disease ([place])    Immunization History  Administered Date(s) Administered  . Moderna SARS-COVID-2 Vaccination 03/24/2020, 04/21/2020   Past Surgical History:  Procedure Laterality Date  . BLADDER REMOVAL    . LOWER EXTREMITY ANGIOGRAPHY Left 04/12/2017   Procedure: Lower Extremity Angiography;  Surgeon: Katha Cabal, MD;  Location: [facility];  Service: Cardiovascular;  Laterality: Left;  . uretostomy    . VASCULAR SURGERY      Social History   Socioeconomic History  . Marital status: Married    Spouse name: Not on file  . Number of children: Not on file  . Years of education: Not on file  . Highest education level: Not on file  Occupational History  . Not on file  Tobacco Use  . Smoking status: Former Smoker    Packs/day: 1.25    Years: 50.00    Pack years: 62.50    Types: Cigarettes    Quit date: 03/13/2020    Years since quitting: 0.1  . Smokeless tobacco: Never Used   Vaping Use  . Vaping Use: Never used  Substance and Sexual Activity  . Alcohol use: No  . Drug use: Not Currently    Types: Marijuana  . Sexual activity: Not Currently  Other Topics Concern  . Not on file  Social History Narrative  . Not on file   Social Determinants of Health   Financial Resource Strain:   . Difficulty of Paying Living Expenses: Not on file  Food Insecurity:   . Worried About Charity fundraiser in the Last Year: Not on file  . Ran Out of Food in the Last Year: Not on file  Transportation Needs:   . Lack of Transportation (Medical): Not on file  . Lack of Transportation (Non-Medical): Not on file  Physical Activity:   . Days of Exercise per Week: Not on file  . Minutes of Exercise per Session: Not on file  Stress:   . Feeling of Stress : Not on file  Social Connections:   . Frequency of Communication with Friends and Family: Not on file  . Frequency of Social Gatherings with Friends and Family: Not on file  . Attends Religious Services: Not on file  . Active Member of Clubs or Organizations: Not on file  . Attends Archivist Meetings: Not on file  . Marital Status: Not on file  Intimate Partner Violence:   . Fear of Current or Ex-Partner: Not on file  . Emotionally Abused: Not on file  .  Physically Abused: Not on file  . Sexually Abused: Not on file    No family history on file.   Current Outpatient Medications:  .  amLODipine (NORVASC) 10 MG tablet, Take 10 mg by mouth daily. , Disp: , Rfl:  .  aspirin EC 81 MG tablet, Take 81 mg by mouth daily.  (Patient not taking: Reported on 05/15/2020), Disp: , Rfl:  .  atorvastatin (LIPITOR) 20 MG tablet, Take 20 mg by mouth every evening. , Disp: , Rfl:  .  clopidogrel (PLAVIX) 75 MG tablet, Take 75 mg by mouth daily. , Disp: , Rfl:  .  dexamethasone (DECADRON) 4 MG tablet, Take 2 tablets (8 mg total) by mouth daily. Start the day after chemotherapy for 2 days., Disp: 30 tablet, Rfl: 1 .   lidocaine-prilocaine (EMLA) cream, Apply to affected area once, Disp: 30 g, Rfl: 3 .  LORazepam (ATIVAN) 0.5 MG tablet, Take 1 tablet (0.5 mg total) by mouth every 6 (six) hours as needed (Nausea or vomiting)., Disp: 30 tablet, Rfl: 0 .  ondansetron (ZOFRAN) 8 MG tablet, Take 1 tablet (8 mg total) by mouth 2 (two) times daily as needed for refractory nausea / vomiting. Start on day 3 after chemo., Disp: 30 tablet, Rfl: 1 .  prochlorperazine (COMPAZINE) 10 MG tablet, Take 1 tablet (10 mg total) by mouth every 6 (six) hours as needed (Nausea or vomiting)., Disp: 30 tablet, Rfl: 1  Physical exam: There were no vitals filed for this visit. Physical Exam   CMP Latest Ref Rng & Units 04/13/2020  Glucose 70 - 99 mg/dL 220(H)  BUN 8 - 23 mg/dL 68(H)  Creatinine 0.61 - 1.24 mg/dL 1.88(H)  Sodium 135 - 145 mmol/L 140  Potassium 3.5 - 5.1 mmol/L 4.9  Chloride 98 - 111 mmol/L 104  CO2 22 - 32 mmol/L 29  Calcium 8.9 - 10.3 mg/dL 8.4(L)  Total Protein 6.5 - 8.1 g/dL 5.7(L)  Total Bilirubin 0.3 - 1.2 mg/dL 0.5  Alkaline Phos 38 - 126 U/L 49  AST 15 - 41 U/L 16  ALT 0 - 44 U/L 28   CBC Latest Ref Rng & Units 05/08/2020  WBC 4.0 - 10.5 K/uL 7.3  Hemoglobin 13.0 - 17.0 g/dL 12.6(L)  Hematocrit 39 - 52 % 38.6(L)  Platelets 150 - 400 K/uL 141(L)    No images are attached to the encounter.  MR BRAIN WO CONTRAST  Result Date: 05/10/2020 CLINICAL DATA:  Lung mass, lymphadenopathy. EXAM: MRI HEAD WITHOUT CONTRAST TECHNIQUE: Multiplanar, multiecho pulse sequences of the brain and surrounding structures were obtained without intravenous contrast. COMPARISON:  None. FINDINGS: The study was obtained without intravenous contrast due to inability to obtain venous access. Brain: No acute infarction, hemorrhage, hydrocephalus, extra-axial collection or mass lesion. Scattered and confluent foci of T2 hyperintensity are seen within the white matter of the cerebral hemispheres, nonspecific, most likely related to  chronic small vessel ischemia. Remote right occipital cortical infarct. Vascular: Normal flow voids. Skull and upper cervical spine: Normal marrow signal. Sinuses/Orbits: Paranasal sinuses are essentially clear. The orbits are maintained. Other: None. IMPRESSION: 1. The study was obtained without intravenous contrast due to inability to obtain venous access. 2. No evidence of intracranial metastatic disease. However, small metastatic lesions could be missed in the absence of contrast enhanced images. 3. Moderate chronic small vessel ischemia. Electronically Signed   By: Pedro Earls M.D.   On: 05/10/2020 14:17   NM PET Image Initial (PI) Skull Base To Thigh  Result Date: 04/23/2020 CLINICAL DATA:  Initial treatment strategy for Right upper lobe lung lesion and mediastinal and hilar adenopathy. Prior history of bladder cancer. EXAM: NUCLEAR MEDICINE PET SKULL BASE TO THIGH TECHNIQUE: 9.33 mCi F-18 FDG was injected intravenously. Full-ring PET imaging was performed from the skull base to thigh after the radiotracer. CT data was obtained and used for attenuation correction and anatomic localization. Fasting blood glucose: 122 mg/dl COMPARISON:  Chest CT 04/05/2020 FINDINGS: Mediastinal blood pool activity: SUV max 1.89 Liver activity: SUV max NA NECK: No neck mass or upper cervical adenopathy. There is an enlarged somewhat necrotic appearing left supraclavicular lymph node measuring approximately 12 mm on image 57/3. This is hypermetabolic with SUV max of 2.13. Incidental CT findings: none CHEST: Ill-defined consolidative process in the right upper lobe peripherally is hypermetabolic with SUV max of 0.86. This is certainly worrisome for infiltrating tumor. It does appear to involve the pleura and is also significant surrounding interstitial thickening which could represent interstitial spread of tumor. An infectious process is also possible but felt to be unlikely. Bulky right hilar and mediastinal  lymphadenopathy is hypermetabolic and worrisome for neoplastic lymphadenopathy. 2.3 cm right paratracheal node on image 91/3 has an SUV max of 5.38. 18.5 mm right hilar node on image 107/3 has an SUV max of 3.82. 17 mm subcarinal lymph node on image 108/3 has an SUV max of 5.31. Patchy nodular opacities at the level lung base along with extensive interstitial thickening in the left lung demonstrates areas of mild hypermetabolism. SUV max is 3.55. This appears somewhat progressive since the prior CT scan and could reflect infection or tumor. Incidental CT findings: Age advanced atherosclerotic calcifications involving the aorta and coronary arteries. Underlying emphysematous changes and interstitial lung disease. ABDOMEN/PELVIS: Abdomen left adrenal gland lesion is not hypermetabolic and is low attenuation in and consistent with a benign adenoma. No findings suspicious for metastatic disease involving the liver. No abdominal/pelvic lymphadenopathy. Incidental CT findings: Age advanced atherosclerotic calcifications involving the aorta and branch vessels. Aortoiliac stents are noted. There is also evidence for prior fem-fem bypass graft. Small gallstone noted the gallbladder. Status post cystectomy and prostatectomy with ileal conduit. SKELETON: No significant bony findings. No evidence of metastatic bone disease. Incidental CT findings: none IMPRESSION: 1. Hypermetabolic ill-defined right upper lobe nodular irregular airspace process suspicious for infiltrating neoplasm. There is also hypermetabolic right hilar and mediastinal lymphadenopathy. Metastatic disease is also possible but seems less likely. 2. Hypermetabolic left supraclavicular lymph node may be amenable to ultrasound-guided biopsy. 3. Progressive nodular airspace process in the left lower lobe demonstrating mild hypermetabolism. Infection versus neoplasm. 4. No abdominal/pelvic metastatic disease. 5. Surgical changes from cysto prostatectomy and ileal  conduit. 6. Significant age advanced vascular disease. Electronically Signed   By: Marijo Sanes M.D.   On: 04/23/2020 11:09   [place] CORE BIOPSY (LYMPH NODES)  Result Date: 05/08/2020 INDICATION: 69 year old male with recently diagnosed right upper lobe lung mass and accompanying left supraclavicular lymphadenopathy. EXAM: ULTRASOUND GUIDED core BIOPSY OF left supraclavicular lymph node MEDICATIONS: None. ANESTHESIA/SEDATION: The procedure was performed under local anesthesia only. PROCEDURE: The procedure, risks, benefits, and alternatives were explained to the patient. Questions regarding the procedure were encouraged and answered. The patient understands and consents to the procedure. The left neck was prepped with chlorhexidine in a sterile fashion, and a sterile drape was applied covering the operative field. A sterile gown and sterile gloves were used for the procedure. An appropriate window for percutaneous biopsy was obtained with  ultrasound. The procedure was planned. Local anesthesia was provided with 1% Lidocaine at the planned skin entry site as well as deeper along the needle entry path. A small skin nick was made. Under ultrasound guidance, 17 gauge, 10 cm introducer needle was directed into the periphery of the lymph node. A total of 418 gauge core biopsies were obtained and placed in formalin. The samples were sent to pathology. The needle was removed and manual compression was held until hemostasis was achieved. Postprocedure ultrasound demonstrates no evidence of hematoma surrounding the lymph node or needle entry site. The patient tolerated the procedure well and was discharged home. COMPLICATIONS: None immediate. FINDINGS: Prominent left supraclavicular lymph node measuring up to 3 cm. IMPRESSION: Technically successful ultrasound-guided core biopsy of left supraclavicular lymph node. Ruthann Cancer, MD [facility] Electronically Signed    By: Ruthann Cancer MD   On: 05/08/2020 11:51     Assessment and plan- Patient is a 69 y.o. male who presents to [facility] for initial meeting in preparation for starting chemotherapy for the treatment of adenocarcinoma of the right upper lobe of lung.   1. Lung cancer-stage IIIc adenocarcinoma of the right upper lobe.  Plan for concurrent chemotherapy (carbo-taxol) and radiation with Dr. Janese Banks and Dr. Baruch Gouty. Treatment given with curative intent.   2. Chemo Care Clinic/High Risk for ER/Hospitalization during chemotherapy- We discussed the role of the chemo care clinic and identified patient specific risk factors. I discussed that patient was identified as moderate risk primarily based on: age, comorbidities and previous hospitalization and ER utilization  3. Social Determinants of Health- we discussed that social determinants of health may have significant impacts on health and outcomes for cancer patients.  Today we discussed specific social determinants of performance status, alcohol use, depression, financial needs, food insecurity, housing, interpersonal violence, social connections, stress, tobacco use, and transportation.  We extensively discussed programs and services available through the cancer center including but not limited to outpatient occupational therapy, care program, counseling services, palliative medicine, symptom management clinic, social work, Public librarian, Building surveyor, support groups, nurse navigation, dietitian, smoking cessation, and transportation assistance.  4. Palliative Care- based on stage of cancer and/or identified needs today, I will refer patient to palliative care for goals of care and advanced care planning.  We also discussed the role of the Symptom Management Clinic at [facility] for acute issues and methods of contacting clinic/provider. He denies needing specific assistance at this time and He will be followed by Telford Nab, RN (Nurse Navigator).    Visit Diagnosis 1. Malignant neoplasm of lung, unspecified laterality, unspecified part of lung ([place])    Patient expressed understanding and was in agreement with this plan. He also understands that He can call clinic at any time with any questions, concerns, or complaints.   A total of (10) minutes of face-to-face time was spent with this patient with greater than 50% of that time in counseling and care-coordination.  Beckey Rutter, DNP, AGNP-C [facility]

## 2020-05-21 ENCOUNTER — Other Ambulatory Visit (INDEPENDENT_AMBULATORY_CARE_PROVIDER_SITE_OTHER): Payer: Self-pay | Admitting: Nurse Practitioner

## 2020-05-21 ENCOUNTER — Telehealth: Payer: Self-pay | Admitting: *Deleted

## 2020-05-21 ENCOUNTER — Other Ambulatory Visit: Payer: Self-pay | Admitting: *Deleted

## 2020-05-21 DIAGNOSIS — C3401 Malignant neoplasm of right main bronchus: Secondary | ICD-10-CM

## 2020-05-21 MED ORDER — PROCHLORPERAZINE MALEATE 10 MG PO TABS: 10.0000 mg | ORAL_TABLET | Freq: Four times a day (QID) | ORAL | 1 refills | Status: DC | PRN

## 2020-05-21 MED ORDER — LIDOCAINE-PRILOCAINE 2.5-2.5 % EX CREA: TOPICAL_CREAM | CUTANEOUS | 3 refills | Status: DC

## 2020-05-21 MED ORDER — DEXAMETHASONE 4 MG PO TABS: 8.0000 mg | ORAL_TABLET | Freq: Every day | ORAL | 1 refills | Status: DC

## 2020-05-21 MED ORDER — LORAZEPAM 0.5 MG PO TABS: 0.5000 mg | ORAL_TABLET | Freq: Four times a day (QID) | ORAL | 0 refills | Status: DC | PRN

## 2020-05-21 MED ORDER — ONDANSETRON HCL 8 MG PO TABS: 8.0000 mg | ORAL_TABLET | Freq: Two times a day (BID) | ORAL | 1 refills | Status: DC | PRN

## 2020-05-21 NOTE — Telephone Encounter (Signed)
Pt called in asking for help paying for medications since he is unable to afford them at this time. Informed pt that will help cover copay cost under the [facility] and that prescriptions will be sent to [facility]. Elease Etienne made aware. Pt instructed to pick [facility] from [facility]. Pt verbalized understanding. Nothing further needed at this time.

## 2020-05-21 NOTE — Telephone Encounter (Signed)
Called and spoke to pt and told him that we know that his first radiation is 11/4 and dr Janese Banks would like to start the chemo part at least within 2 days of the first day of treatment. If it is ok with him we will change his Monday 11/1 appt to Tuesday 11/2. The times will change to 8 am  For port labs and 8:30 to see md and then get the infusion. He is ok with this and wants a print out.. he is coming tomorrow to get port place and I will take him a schedule while he is there.

## 2020-05-22 ENCOUNTER — Ambulatory Visit
Admission: RE | Admit: 2020-05-22 | Discharge: 2020-05-22 | Disposition: A | Discharge status: Home / Self Care | Payer: Medicare Other | Attending: Vascular Surgery | Admitting: Vascular Surgery

## 2020-05-22 ENCOUNTER — Encounter: Payer: Self-pay | Admitting: Vascular Surgery

## 2020-05-22 ENCOUNTER — Encounter
Admission: RE | Disposition: A | Discharge status: Home / Self Care | Payer: Self-pay | Source: Home / Self Care | Attending: Vascular Surgery

## 2020-05-22 ENCOUNTER — Ambulatory Visit: Admission: RE | Admit: 2020-05-22 | Payer: Medicare Other | Source: Ambulatory Visit

## 2020-05-22 ENCOUNTER — Other Ambulatory Visit: Payer: Self-pay | Admitting: *Deleted

## 2020-05-22 ENCOUNTER — Other Ambulatory Visit: Payer: Self-pay

## 2020-05-22 DIAGNOSIS — C3401 Malignant neoplasm of right main bronchus: Secondary | ICD-10-CM

## 2020-05-22 DIAGNOSIS — C3490 Malignant neoplasm of unspecified part of unspecified bronchus or lung: Secondary | ICD-10-CM

## 2020-05-22 DIAGNOSIS — Z87891 Personal history of nicotine dependence: Secondary | ICD-10-CM | POA: Diagnosis not present

## 2020-05-22 DIAGNOSIS — Z8551 Personal history of malignant neoplasm of bladder: Secondary | ICD-10-CM | POA: Insufficient documentation

## 2020-05-22 HISTORY — PX: PORTA CATH INSERTION: CATH118285

## 2020-05-22 SURGERY — PORTA CATH INSERTION
Anesthesia: Moderate Sedation

## 2020-05-22 MED ORDER — LORAZEPAM 0.5 MG PO TABS: 0.5000 mg | ORAL_TABLET | Freq: Four times a day (QID) | ORAL | 0 refills | Status: DC | PRN

## 2020-05-22 MED ORDER — HYDROMORPHONE HCL 1 MG/ML IJ SOLN: 1.0000 mg | Freq: Once | INTRAMUSCULAR | Status: DC | PRN

## 2020-05-22 MED ORDER — METHYLPREDNISOLONE SODIUM SUCC 125 MG IJ SOLR: 125.0000 mg | Freq: Once | INTRAMUSCULAR | Status: DC | PRN

## 2020-05-22 MED ORDER — MIDAZOLAM HCL 2 MG/2ML IJ SOLN
INTRAMUSCULAR | Status: DC | PRN
  Administered 2020-05-22: 1 mg via INTRAVENOUS

## 2020-05-22 MED ORDER — SODIUM CHLORIDE 0.9 % IV SOLN: INTRAVENOUS | Status: DC

## 2020-05-22 MED ORDER — CEFAZOLIN SODIUM-DEXTROSE 2-4 GM/100ML-% IV SOLN
INTRAVENOUS | Status: AC
  Administered 2020-05-22: 2 g via INTRAVENOUS
  Filled 2020-05-22: qty 100

## 2020-05-22 MED ORDER — MIDAZOLAM HCL 2 MG/ML PO SYRP: 8.0000 mg | ORAL_SOLUTION | Freq: Once | ORAL | Status: DC | PRN

## 2020-05-22 MED ORDER — CEFAZOLIN SODIUM-DEXTROSE 2-4 GM/100ML-% IV SOLN: 2.0000 g | Freq: Once | INTRAVENOUS | Status: AC

## 2020-05-22 MED ORDER — CHLORHEXIDINE GLUCONATE CLOTH 2 % EX PADS
6.0000 | MEDICATED_PAD | Freq: Every day | CUTANEOUS | Status: DC
  Administered 2020-05-22: 6 via TOPICAL

## 2020-05-22 MED ORDER — SODIUM CHLORIDE 0.9 % IV SOLN
Freq: Once | INTRAVENOUS | Status: DC
  Filled 2020-05-22 (×2): qty 2

## 2020-05-22 MED ORDER — MIDAZOLAM HCL 2 MG/2ML IJ SOLN
INTRAMUSCULAR | Status: AC
  Filled 2020-05-22: qty 2

## 2020-05-22 MED ORDER — FENTANYL CITRATE (PF) 100 MCG/2ML IJ SOLN
INTRAMUSCULAR | Status: AC
  Filled 2020-05-22: qty 2

## 2020-05-22 MED ORDER — ONDANSETRON HCL 4 MG/2ML IJ SOLN: 4.0000 mg | Freq: Four times a day (QID) | INTRAMUSCULAR | Status: DC | PRN

## 2020-05-22 MED ORDER — DIPHENHYDRAMINE HCL 50 MG/ML IJ SOLN: 50.0000 mg | Freq: Once | INTRAMUSCULAR | Status: DC | PRN

## 2020-05-22 MED ORDER — FENTANYL CITRATE (PF) 100 MCG/2ML IJ SOLN
INTRAMUSCULAR | Status: DC | PRN
  Administered 2020-05-22: 25 ug via INTRAVENOUS

## 2020-05-22 MED ORDER — FAMOTIDINE 20 MG PO TABS: 40.0000 mg | ORAL_TABLET | Freq: Once | ORAL | Status: DC | PRN

## 2020-05-22 SURGICAL SUPPLY — 8 items
ADH SKN CLS APL DERMABOND .7 (GAUZE/BANDAGES/DRESSINGS) ×1
DERMABOND ADVANCED (GAUZE/BANDAGES/DRESSINGS) ×1
DERMABOND ADVANCED .7 DNX12 (GAUZE/BANDAGES/DRESSINGS) ×1 IMPLANT
KIT PORT POWER 8FR ISP CVUE (Port) ×2 IMPLANT
PACK ANGIOGRAPHY (CUSTOM PROCEDURE TRAY) ×2 IMPLANT
SUT MNCRL AB 4-0 PS2 18 (SUTURE) ×2 IMPLANT
SUT VIC AB 3-0 SH 27 (SUTURE) ×2
SUT VIC AB 3-0 SH 27X BRD (SUTURE) ×1 IMPLANT

## 2020-05-22 NOTE — Interval H&P Note (Signed)
History and Physical Interval Note:  05/22/2020 8:12 AM  Martin Jennings  has presented today for surgery, with the diagnosis of Porta Cath Placement    Lung Ca.  The various methods of treatment have been discussed with the patient and family. After consideration of risks, benefits and other options for treatment, the patient has consented to  Procedure(s): PORTA CATH INSERTION (N/A) as a surgical intervention.  The patient's history has been reviewed, patient examined, no change in status, stable for surgery.  I have reviewed the patient's chart and labs.  Questions were answered to the patient's satisfaction.     Leotis Pain

## 2020-05-22 NOTE — Op Note (Signed)
       VEIN AND VASCULAR SURGERY       Operative Note  Date: 05/22/2020  Preoperative diagnosis:  1. Lung cancer  Postoperative diagnosis:  Same as above  Procedures: #1. Ultrasound guidance for vascular access to the right internal jugular vein. #2. Fluoroscopic guidance for placement of catheter. #3. Placement of CT compatible Port-A-Cath, right internal jugular vein.  Surgeon: Leotis Pain, MD.   Anesthesia: Local with moderate conscious sedation for approximately 25  minutes using 1 mg of Versed and 25 mcg of Fentanyl  Fluoroscopy time: less than 1 minute  Contrast used: 0  Estimated blood loss: 2 cc  Indication for the procedure:  The patient is a 69 y.o.male with lung cancer.  The patient needs a Port-A-Cath for durable venous access, chemotherapy, lab draws, and CT scans. We are asked to place this. Risks and benefits were discussed and informed consent was obtained.  Description of procedure: The patient was brought to the vascular and interventional radiology suite.  Moderate conscious sedation was administered throughout the procedure during a face to face encounter with the patient with my supervision of the RN administering medicines and monitoring the patient's vital signs, pulse oximetry, telemetry and mental status throughout from the start of the procedure until the patient was taken to the recovery room. The right neck chest and shoulder were sterilely prepped and draped, and a sterile surgical field was created. Ultrasound was used to help visualize a patent right internal jugular vein. This was then accessed under direct ultrasound guidance without difficulty with the Seldinger needle and a permanent image was recorded. A J-wire was placed. After skin nick and dilatation, the peel-away sheath was then placed over the wire. I then anesthetized an area under the clavicle approximately 1-2 fingerbreadths. A transverse incision was created and an inferior pocket was  created with electrocautery and blunt dissection. The port was then brought onto the field, placed into the pocket and secured to the chest wall with 2 Prolene sutures. The catheter was connected to the port and tunneled from the subclavicular incision to the access site. Fluoroscopic guidance was then used to cut the catheter to an appropriate length. The catheter was then placed through the peel-away sheath and the peel-away sheath was removed. The catheter tip was parked in excellent location under fluorocoscopic guidance in the cavoatrial junction. The pocket was then irrigated with antibiotic impregnated saline and the wound was closed with a running 3-0 Vicryl and a 4-0 Monocryl. The access incision was closed with a single 4-0 Monocryl. The Huber needle was used to withdraw blood and flush the port with heparinized saline. Dermabond was then placed as a dressing. The patient tolerated the procedure well and was taken to the recovery room in stable condition.   Leotis Pain 05/22/2020 9:22 AM   This note was created with Dragon Medical transcription system. Any errors in dictation are purely unintentional.

## 2020-05-22 NOTE — Addendum Note (Signed)
Addended by: Randa Evens C on: 05/22/2020 01:28 PM   Modules accepted: Orders

## 2020-05-22 NOTE — Addendum Note (Signed)
Addended by: Telford Nab on: 05/22/2020 01:20 PM   Modules accepted: Orders

## 2020-05-22 NOTE — Discharge Instructions (Signed)
Implanted Port Home Guide  An implanted port is a type of central line that is placed under the skin. Central lines are used to provide IV access when treatment or nutrition needs to be given through a person's veins. Implanted ports are used for long-term IV access. An implanted port may be placed because: You need IV medicine that would be irritating to the small veins in your hands or arms. You need long-term IV medicines, such as antibiotics. You need IV nutrition for a long period. You need frequent blood draws for lab tests. You need dialysis.   Implanted ports are usually placed in the chest area, but they can also be placed in the upper arm, the abdomen, or the leg. An implanted port has two main parts: Reservoir. The reservoir is round and will appear as a small, raised area under your skin. The reservoir is the part where a needle is inserted to give medicines or draw blood. Catheter. The catheter is a thin, flexible tube that extends from the reservoir. The catheter is placed into a large vein. Medicine that is inserted into the reservoir goes into the catheter and then into the vein.   How will I care for my incision  You may shower tomorrow  How is my port accessed? Special steps must be taken to access the port: Before the port is accessed, a numbing cream can be placed on the skin. This helps numb the skin over the port site. Your health care provider uses a sterile technique to access the port. Your health care provider must put on a mask and sterile gloves. The skin over your port is cleaned carefully with an antiseptic and allowed to dry. The port is gently pinched between sterile gloves, and a needle is inserted into the port. Only "non-coring" port needles should be used to access the port. Once the port is accessed, a blood return should be checked. This helps ensure that the port is in the vein and is not clogged. If your port needs to remain accessed for a constant  infusion, a clear (transparent) bandage will be placed over the needle site. The bandage and needle will need to be changed every week, or as directed by your health care provider.   What is flushing? Flushing helps keep the port from getting clogged. Follow your health care provider's instructions on how and when to flush the port. Ports are usually flushed with saline solution or a medicine called heparin. The need for flushing will depend on how the port is used. If the port is used for intermittent medicines or blood draws, the port will need to be flushed: After medicines have been given. After blood has been drawn. As part of routine maintenance. If a constant infusion is running, the port may not need to be flushed.   How long will my port stay implanted? The port can stay in for as long as your health care provider thinks it is needed. When it is time for the port to come out, surgery will be done to remove it. The procedure is similar to the one performed when the port was put in. When should I seek immediate medical care? When you have an implanted port, you should seek immediate medical care if: You notice a bad smell coming from the incision site. You have swelling, redness, or drainage at the incision site. You have more swelling or pain at the port site or the surrounding area. You have a fever that   is not controlled with medicine.   This information is not intended to replace advice given to you by your health care provider. Make sure you discuss any questions you have with your health care provider. Document Released: 07/12/2005 Document Revised: 12/18/2015 Document Reviewed: 03/19/2013 Elsevier Interactive Patient Education  2017 Elsevier Inc.    

## 2020-05-22 NOTE — Progress Notes (Signed)
D/C instructions reviewed with pt wife.  All questions answered, pt wife verbalized understanding of all d/c instructions.

## 2020-05-22 NOTE — Progress Notes (Signed)
Pt alert and oriented, vss, low O2 level pt currently on 5L n/c 02 level in high 80's to low 90's.  Pt will be going home with portable home O2 per oncology nurse.  Pt and wife educated on the importance of using O2 and bringing portable o2 while out of the house.  Pt and wife verbalized understanding without any further questions.

## 2020-05-22 NOTE — Progress Notes (Signed)
Pt left facility at 1105 when Home O2 was delivered.  Instructed pt and wife to have pt wear O2 home and to monitor pt O2 level.  Pt and wife was transported via wheelchair to personal vehicle.

## 2020-05-22 NOTE — Telephone Encounter (Addendum)
Received epic secure message from [facility], [place]. Hayley stated that patient is currently receiving daily treatment and spo2 was 81% upon arrival. She requested that our office provide patient with oxygen tank to go home on. Upon research, it appears that patient has oxygen with [employer]. We do not have [employer] tanks in office.  I have spoken to [facility] with [employer] and requested that patient have an oxygen tank delivered today at the cancer center. Pam stated that 4 tanks could be delivered to patient today. Pam was provided with address.  Hayley has been made aware of this information.

## 2020-05-26 ENCOUNTER — Inpatient Hospital Stay: Payer: Medicare Other

## 2020-05-26 ENCOUNTER — Inpatient Hospital Stay: Payer: Medicare Other | Admitting: Oncology

## 2020-05-26 DIAGNOSIS — Z510 Encounter for antineoplastic radiation therapy: Secondary | ICD-10-CM | POA: Diagnosis present

## 2020-05-26 DIAGNOSIS — C3411 Malignant neoplasm of upper lobe, right bronchus or lung: Secondary | ICD-10-CM | POA: Diagnosis present

## 2020-05-27 ENCOUNTER — Encounter: Payer: Self-pay | Admitting: Oncology

## 2020-05-27 ENCOUNTER — Inpatient Hospital Stay: Payer: Medicare Other

## 2020-05-27 ENCOUNTER — Encounter: Payer: Self-pay | Admitting: *Deleted

## 2020-05-27 ENCOUNTER — Inpatient Hospital Stay: Payer: Medicare Other | Admitting: Oncology

## 2020-05-27 ENCOUNTER — Inpatient Hospital Stay (HOSPITAL_BASED_OUTPATIENT_CLINIC_OR_DEPARTMENT_OTHER): Payer: Medicare Other | Admitting: Oncology

## 2020-05-27 ENCOUNTER — Inpatient Hospital Stay: Payer: Medicare Other | Attending: Oncology

## 2020-05-27 VITALS — BP 128/61 | HR 91 | Temp 97.4°F | Resp 18 | Wt 177.2 lb

## 2020-05-27 VITALS — BP 138/66 | HR 77 | Temp 96.0°F | Resp 20

## 2020-05-27 DIAGNOSIS — N183 Chronic kidney disease, stage 3 unspecified: Secondary | ICD-10-CM | POA: Diagnosis not present

## 2020-05-27 DIAGNOSIS — Z5111 Encounter for antineoplastic chemotherapy: Secondary | ICD-10-CM

## 2020-05-27 DIAGNOSIS — Z8551 Personal history of malignant neoplasm of bladder: Secondary | ICD-10-CM | POA: Diagnosis not present

## 2020-05-27 DIAGNOSIS — Z7189 Other specified counseling: Secondary | ICD-10-CM | POA: Diagnosis not present

## 2020-05-27 DIAGNOSIS — C3411 Malignant neoplasm of upper lobe, right bronchus or lung: Secondary | ICD-10-CM | POA: Insufficient documentation

## 2020-05-27 DIAGNOSIS — Z8616 Personal history of COVID-19: Secondary | ICD-10-CM | POA: Diagnosis not present

## 2020-05-27 DIAGNOSIS — I739 Peripheral vascular disease, unspecified: Secondary | ICD-10-CM | POA: Insufficient documentation

## 2020-05-27 DIAGNOSIS — Z7982 Long term (current) use of aspirin: Secondary | ICD-10-CM | POA: Insufficient documentation

## 2020-05-27 DIAGNOSIS — N1831 Chronic kidney disease, stage 3a: Secondary | ICD-10-CM

## 2020-05-27 DIAGNOSIS — C3490 Malignant neoplasm of unspecified part of unspecified bronchus or lung: Secondary | ICD-10-CM

## 2020-05-27 DIAGNOSIS — Z87891 Personal history of nicotine dependence: Secondary | ICD-10-CM | POA: Diagnosis not present

## 2020-05-27 DIAGNOSIS — C3401 Malignant neoplasm of right main bronchus: Secondary | ICD-10-CM

## 2020-05-27 DIAGNOSIS — I10 Essential (primary) hypertension: Secondary | ICD-10-CM | POA: Diagnosis not present

## 2020-05-27 DIAGNOSIS — Z79899 Other long term (current) drug therapy: Secondary | ICD-10-CM | POA: Diagnosis not present

## 2020-05-27 LAB — COMPREHENSIVE METABOLIC PANEL
ALT: 16 U/L (ref 0–44)
AST: 16 U/L (ref 15–41)
Albumin: 2.7 g/dL — ABNORMAL LOW (ref 3.5–5.0)
Alkaline Phosphatase: 48 U/L (ref 38–126)
Anion gap: 9 (ref 5–15)
BUN: 33 mg/dL — ABNORMAL HIGH (ref 8–23)
CO2: 23 mmol/L (ref 22–32)
Calcium: 8.7 mg/dL — ABNORMAL LOW (ref 8.9–10.3)
Chloride: 107 mmol/L (ref 98–111)
Creatinine, Ser: 1.89 mg/dL — ABNORMAL HIGH (ref 0.61–1.24)
GFR, Estimated: 38 mL/min — ABNORMAL LOW (ref >60)
Glucose, Bld: 152 mg/dL — ABNORMAL HIGH (ref 70–99)
Potassium: 4.1 mmol/L (ref 3.5–5.1)
Sodium: 139 mmol/L (ref 135–145)
Total Bilirubin: 0.5 mg/dL (ref 0.3–1.2)
Total Protein: 6.2 g/dL — ABNORMAL LOW (ref 6.5–8.1)

## 2020-05-27 LAB — CBC WITH DIFFERENTIAL/PLATELET
Abs Immature Granulocytes: 0.05 10*3/uL (ref 0.00–0.07)
Basophils Absolute: 0 10*3/uL (ref 0.0–0.1)
Basophils Relative: 0 %
Eosinophils Absolute: 0.2 10*3/uL (ref 0.0–0.5)
Eosinophils Relative: 2 %
HCT: 36.9 % — ABNORMAL LOW (ref 39.0–52.0)
Hemoglobin: 11.9 g/dL — ABNORMAL LOW (ref 13.0–17.0)
Immature Granulocytes: 1 %
Lymphocytes Relative: 19 %
Lymphs Abs: 1.4 10*3/uL (ref 0.7–4.0)
MCH: 31.3 pg (ref 26.0–34.0)
MCHC: 32.2 g/dL (ref 30.0–36.0)
MCV: 97.1 fL (ref 80.0–100.0)
Monocytes Absolute: 0.7 10*3/uL (ref 0.1–1.0)
Monocytes Relative: 10 %
Neutro Abs: 5.1 10*3/uL (ref 1.7–7.7)
Neutrophils Relative %: 68 %
Platelets: 303 10*3/uL (ref 150–400)
RBC: 3.8 MIL/uL — ABNORMAL LOW (ref 4.22–5.81)
RDW: 14.5 % (ref 11.5–15.5)
WBC: 7.5 10*3/uL (ref 4.0–10.5)
nRBC: 0 % (ref 0.0–0.2)

## 2020-05-27 MED ORDER — HEPARIN SOD (PORK) LOCK FLUSH 100 UNIT/ML IV SOLN
INTRAVENOUS | Status: AC
  Filled 2020-05-27: qty 5

## 2020-05-27 MED ORDER — SODIUM CHLORIDE 0.9 % IV SOLN
20.0000 mg | Freq: Once | INTRAVENOUS | Status: AC
  Administered 2020-05-27: 20 mg via INTRAVENOUS
  Filled 2020-05-27: qty 2

## 2020-05-27 MED ORDER — SODIUM CHLORIDE 0.9 % IV SOLN
Freq: Once | INTRAVENOUS | Status: AC
  Filled 2020-05-27: qty 250

## 2020-05-27 MED ORDER — FAMOTIDINE IN NACL 20-0.9 MG/50ML-% IV SOLN
20.0000 mg | Freq: Once | INTRAVENOUS | Status: AC
  Administered 2020-05-27: 20 mg via INTRAVENOUS

## 2020-05-27 MED ORDER — SODIUM CHLORIDE 0.9 % IV SOLN
130.6000 mg | Freq: Once | INTRAVENOUS | Status: AC
  Administered 2020-05-27: 130 mg via INTRAVENOUS
  Filled 2020-05-27: qty 13

## 2020-05-27 MED ORDER — SODIUM CHLORIDE 0.9% FLUSH
10.0000 mL | Freq: Once | INTRAVENOUS | Status: AC
  Administered 2020-05-27: 10 mL via INTRAVENOUS
  Filled 2020-05-27: qty 10

## 2020-05-27 MED ORDER — HEPARIN SOD (PORK) LOCK FLUSH 100 UNIT/ML IV SOLN
500.0000 [IU] | Freq: Once | INTRAVENOUS | Status: AC
  Administered 2020-05-27: 500 [IU] via INTRAVENOUS
  Filled 2020-05-27: qty 5

## 2020-05-27 MED ORDER — PALONOSETRON HCL INJECTION 0.25 MG/5ML
0.2500 mg | Freq: Once | INTRAVENOUS | Status: AC
  Administered 2020-05-27: 0.25 mg via INTRAVENOUS

## 2020-05-27 MED ORDER — DIPHENHYDRAMINE HCL 50 MG/ML IJ SOLN
50.0000 mg | Freq: Once | INTRAMUSCULAR | Status: AC
  Administered 2020-05-27: 50 mg via INTRAVENOUS

## 2020-05-27 MED ORDER — SODIUM CHLORIDE 0.9 % IV SOLN
45.0000 mg/m2 | Freq: Once | INTRAVENOUS | Status: AC
  Administered 2020-05-27: 90 mg via INTRAVENOUS
  Filled 2020-05-27: qty 15

## 2020-05-27 NOTE — Progress Notes (Signed)
Hematology/Oncology Consult note [facility]  Telephone:([phone] [phone]  Patient Care Team: Perrin Maltese, MD as PCP - General (Internal Medicine) Telford Nab, RN as Oncology Nurse Navigator Sindy Guadeloupe, MD as Consulting Physician (Hematology and Oncology)   Name of the patient: Martin Jennings  [id number]  09/20/1950   Date of visit: 05/27/20  Diagnosis- stage IIIc adenocarcinoma of the lung cT3 cN3 M0  Chief complaint/ Reason for visit-on treatment assessment prior to cycle 1 of weekly carbotaxol chemotherapy  Heme/Onc history: Patient is a 69 year old male with a history of tobacco dependence who was admitted to the hospital for symptoms of acute onset shortness of breath and was found to have Covid pneumonia. He underwent CT angio chest which showed extensive mediastinal as well as bilateral hilar adenopathy. Masslike opacity with architectural distortion involving the right upper lobe measuring 5.7 x 2.9 x 2.3 cm and groundglass opacities bilaterally. Mass involving the left adrenal gland as well as a smaller right adrenal gland possibly indicating adrenal metastases.  This was followed by a PET CT scan 2 weeks later which showed the consolidative process in the right upper lobe suv 7.88 concerning for infiltrating tumor. It appears to involve the pleura associated interstitial thickening was also seen. Bulky right hilar and mediastinal adenopathy. Enlarged somewhat necrotic appearing left supraclavicular lymph node was also hypermetabolic. Adrenal gland was not hypermetabolic and was consistent with a benign adenoma. No metastatic disease involving liver. No features of distant metastatic disease.  Pathology showed metastatic non-small cell lung cancer which was TTF-1 positive consistent with adenocarcinoma.   Interval history-reports chronic fatigue.  He has home oxygen which he typically needs for 24 hours since he  saturates about 85 to 87% on room air but he does not use it all the time.  He has come to the clinic today without any home oxygen.  Has occasional hemoptysis which she reports is better than what he had previously.  ECOG PS- 2 Pain scale- 0 Opioid associated constipation- no  Review of systems- Review of Systems  Constitutional: Positive for malaise/fatigue. Negative for chills, fever and weight loss.  HENT: Negative for congestion, ear discharge and nosebleeds.   Eyes: Negative for blurred vision.  Respiratory: Positive for shortness of breath. Negative for cough, hemoptysis, sputum production and wheezing.   Cardiovascular: Negative for chest pain, palpitations, orthopnea and claudication.  Gastrointestinal: Negative for abdominal pain, blood in stool, constipation, diarrhea, heartburn, melena, nausea and vomiting.  Genitourinary: Negative for dysuria, flank pain, frequency, hematuria and urgency.  Musculoskeletal: Negative for back pain, joint pain and myalgias.  Skin: Negative for rash.  Neurological: Negative for dizziness, tingling, focal weakness, seizures, weakness and headaches.  Endo/Heme/Allergies: Does not bruise/bleed easily.  Psychiatric/Behavioral: Negative for depression and suicidal ideas. The patient does not have insomnia.       No Known Allergies   Past Medical History:  Diagnosis Date  . Bladder cancer ([place]) 2012  . COVID-19 virus infection 04/03/2020  . Hypertension   . Peripheral vascular disease [facility])      Past Surgical History:  Procedure Laterality Date  . BLADDER REMOVAL    . LOWER EXTREMITY ANGIOGRAPHY Left 04/12/2017   Procedure: Lower Extremity Angiography;  Surgeon: Katha Cabal, MD;  Location: [place] [facility];  Service: Cardiovascular;  Laterality: Left;  . PORTA CATH INSERTION N/A 05/22/2020   Procedure: PORTA CATH INSERTION;  Surgeon: Algernon Huxley, MD;  Location: [place] [facility];  Service:  Cardiovascular;  Laterality: N/A;  .  uretostomy    . VASCULAR SURGERY      Social History   Socioeconomic History  . Marital status: Married    Spouse name: Not on file  . Number of children: Not on file  . Years of education: Not on file  . Highest education level: Not on file  Occupational History  . Not on file  Tobacco Use  . Smoking status: Former Smoker    Packs/day: 1.25    Years: 50.00    Pack years: 62.50    Types: Cigarettes    Quit date: 03/13/2020    Years since quitting: 0.2  . Smokeless tobacco: Never Used  Vaping Use  . Vaping Use: Never used  Substance and Sexual Activity  . Alcohol use: No  . Drug use: Not Currently    Types: Marijuana  . Sexual activity: Not Currently  Other Topics Concern  . Not on file  Social History Narrative  . Not on file   Social Determinants of Health   Financial Resource Strain:   . Difficulty of Paying Living Expenses: Not on file  Food Insecurity:   . Worried About Charity fundraiser in the Last Year: Not on file  . Ran Out of Food in the Last Year: Not on file  Transportation Needs:   . Lack of Transportation (Medical): Not on file  . Lack of Transportation (Non-Medical): Not on file  Physical Activity:   . Days of Exercise per Week: Not on file  . Minutes of Exercise per Session: Not on file  Stress:   . Feeling of Stress : Not on file  Social Connections:   . Frequency of Communication with Friends and Family: Not on file  . Frequency of Social Gatherings with Friends and Family: Not on file  . Attends Religious Services: Not on file  . Active Member of Clubs or Organizations: Not on file  . Attends Archivist Meetings: Not on file  . Marital Status: Not on file  Intimate Partner Violence:   . Fear of Current or Ex-Partner: Not on file  . Emotionally Abused: Not on file  . Physically Abused: Not on file  . Sexually Abused: Not on file    No family history on file.   Current Outpatient Medications:  .  amLODipine (NORVASC) 10 MG  tablet, Take 10 mg by mouth daily. , Disp: , Rfl:  .  aspirin EC 81 MG tablet, Take 81 mg by mouth daily.  (Patient not taking: Reported on 05/15/2020), Disp: , Rfl:  .  atorvastatin (LIPITOR) 20 MG tablet, Take 20 mg by mouth every evening. , Disp: , Rfl:  .  clopidogrel (PLAVIX) 75 MG tablet, Take 75 mg by mouth daily. , Disp: , Rfl:  .  dexamethasone (DECADRON) 4 MG tablet, Take 2 tablets (8 mg total) by mouth daily. Start the day after chemotherapy for 2 days., Disp: 30 tablet, Rfl: 1 .  lidocaine-prilocaine (EMLA) cream, Apply to affected area once, Disp: 30 g, Rfl: 3 .  LORazepam (ATIVAN) 0.5 MG tablet, Take 1 tablet (0.5 mg total) by mouth every 6 (six) hours as needed (Nausea or vomiting)., Disp: 30 tablet, Rfl: 0 .  ondansetron (ZOFRAN) 8 MG tablet, Take 1 tablet (8 mg total) by mouth 2 (two) times daily as needed for refractory nausea / vomiting. Start on day 3 after chemo., Disp: 30 tablet, Rfl: 1 .  prochlorperazine (COMPAZINE) 10 MG tablet, Take 1 tablet (  10 mg total) by mouth every 6 (six) hours as needed (Nausea or vomiting)., Disp: 30 tablet, Rfl: 1 No current facility-administered medications for this visit.  Facility-Administered Medications Ordered in Other Visits:  .  heparin lock flush 100 unit/mL, 500 Units, Intravenous, Once, Randa Evens C, MD .  sodium chloride flush (NS) 0.9 % injection 10 mL, 10 mL, Intravenous, Once, Sindy Guadeloupe, MD  Physical exam:  Vitals:   05/27/20 0839  BP: 128/61  Pulse: 91  Resp: 18  Temp: (!) 97.4 F (36.3 C)  TempSrc: Tympanic  SpO2: (!) 85%  Weight: 177 lb 4 oz (80.4 kg)   Physical Exam Constitutional:      General: He is not in acute distress. Cardiovascular:     Rate and Rhythm: Normal rate and regular rhythm.     Heart sounds: Normal heart sounds.  Pulmonary:     Comments: Effort of breathing increased.  Scattered bilateral wheezing Abdominal:     General: Bowel sounds are normal.     Palpations: Abdomen is soft.   Skin:    General: Skin is warm and dry.  Neurological:     Mental Status: He is alert and oriented to person, place, and time.      CMP Latest Ref Rng & Units 04/13/2020  Glucose 70 - 99 mg/dL 220(H)  BUN 8 - 23 mg/dL 68(H)  Creatinine 0.61 - 1.24 mg/dL 1.88(H)  Sodium 135 - 145 mmol/L 140  Potassium 3.5 - 5.1 mmol/L 4.9  Chloride 98 - 111 mmol/L 104  CO2 22 - 32 mmol/L 29  Calcium 8.9 - 10.3 mg/dL 8.4(L)  Total Protein 6.5 - 8.1 g/dL 5.7(L)  Total Bilirubin 0.3 - 1.2 mg/dL 0.5  Alkaline Phos 38 - 126 U/L 49  AST 15 - 41 U/L 16  ALT 0 - 44 U/L 28   CBC Latest Ref Rng & Units 05/08/2020  WBC 4.0 - 10.5 K/uL 7.3  Hemoglobin 13.0 - 17.0 g/dL 12.6(L)  Hematocrit 39 - 52 % 38.6(L)  Platelets 150 - 400 K/uL 141(L)    No images are attached to the encounter.  MR BRAIN WO CONTRAST  Result Date: 05/10/2020 CLINICAL DATA:  Lung mass, lymphadenopathy. EXAM: MRI HEAD WITHOUT CONTRAST TECHNIQUE: Multiplanar, multiecho pulse sequences of the brain and surrounding structures were obtained without intravenous contrast. COMPARISON:  None. FINDINGS: The study was obtained without intravenous contrast due to inability to obtain venous access. Brain: No acute infarction, hemorrhage, hydrocephalus, extra-axial collection or mass lesion. Scattered and confluent foci of T2 hyperintensity are seen within the white matter of the cerebral hemispheres, nonspecific, most likely related to chronic small vessel ischemia. Remote right occipital cortical infarct. Vascular: Normal flow voids. Skull and upper cervical spine: Normal marrow signal. Sinuses/Orbits: Paranasal sinuses are essentially clear. The orbits are maintained. Other: None. IMPRESSION: 1. The study was obtained without intravenous contrast due to inability to obtain venous access. 2. No evidence of intracranial metastatic disease. However, small metastatic lesions could be missed in the absence of contrast enhanced images. 3. Moderate chronic  small vessel ischemia. Electronically Signed   By: Pedro Earls M.D.   On: 05/10/2020 14:17   PERIPHERAL VASCULAR CATHETERIZATION  Result Date: 05/22/2020 See op note  [place] CORE BIOPSY (LYMPH NODES)  Result Date: 05/08/2020 INDICATION: 69 year old male with recently diagnosed right upper lobe lung mass and accompanying left supraclavicular lymphadenopathy. EXAM: ULTRASOUND GUIDED core BIOPSY OF left supraclavicular lymph node MEDICATIONS: None. ANESTHESIA/SEDATION: The procedure was performed under local  anesthesia only. PROCEDURE: The procedure, risks, benefits, and alternatives were explained to the patient. Questions regarding the procedure were encouraged and answered. The patient understands and consents to the procedure. The left neck was prepped with chlorhexidine in a sterile fashion, and a sterile drape was applied covering the operative field. A sterile gown and sterile gloves were used for the procedure. An appropriate window for percutaneous biopsy was obtained with ultrasound. The procedure was planned. Local anesthesia was provided with 1% Lidocaine at the planned skin entry site as well as deeper along the needle entry path. A small skin nick was made. Under ultrasound guidance, 17 gauge, 10 cm introducer needle was directed into the periphery of the lymph node. A total of 418 gauge core biopsies were obtained and placed in formalin. The samples were sent to pathology. The needle was removed and manual compression was held until hemostasis was achieved. Postprocedure ultrasound demonstrates no evidence of hematoma surrounding the lymph node or needle entry site. The patient tolerated the procedure well and was discharged home. COMPLICATIONS: None immediate. FINDINGS: Prominent left supraclavicular lymph node measuring up to 3 cm. IMPRESSION: Technically successful ultrasound-guided core biopsy of left supraclavicular lymph node. Ruthann Cancer, MD [facility] Electronically Signed   By: Ruthann Cancer MD   On: 05/08/2020 11:51     Assessment and plan- Patient is a 69 y.o. male with newly diagnosed stage IIIc adenocarcinoma of the right upper lobe of the lung cT4 N3 M0 here for on treatment assessment prior to cycle 1 of weekly carbotaxol chemotherapy  Counts okay to proceed with cycle 1 of weekly carbotaxol chemotherapy today.  He will directly proceed for cycle 2 next week and I will see him back in 2 weeks for cycle 3.  Discussed the significance of chemotherapy including all but not limited to nausea, vomiting, low blood counts, risk of infections and hospitalization.  Risk of peripheral neuropathy and infusion reaction associated with Taxol.  Patient understands and agrees to proceed as planned.  Treatment will be given with a curative intent  Patient has baseline CKD which is essentially stable  Patient has baseline chronic hypoxic respiratory failure since his Covid diagnosis which is likely multifactorial secondary to underlying lung cancer COPD as well as Covid.  I have advised him to use oxygen at all times.  Patient states that he uses an inhaler once a day which I do not see on his medication list.  I will get in touch with pulmonary to optimize his medications for underlying COPD   Visit Diagnosis 1. Encounter for antineoplastic chemotherapy   2. Malignant neoplasm of upper lobe of right lung ([place])   3. Goals of care, counseling/discussion   4. Stage 3a chronic kidney disease ([place])      Dr. Randa Evens, MD, MPH [facility] [phone] 05/27/2020 8:38 AM

## 2020-05-27 NOTE — Progress Notes (Signed)
Creatinine: 1.89. MD, Dr. Janese Banks, notified and aware. Per MD order: proceed with scheduled new Taxol and Carboplatin treatment today.  1400- Patient tolerated new Taxol and Carboplatin treatment well. Vital signs stable. Patient discharged to home at this time.

## 2020-05-27 NOTE — Progress Notes (Signed)
Oncology Nurse Navigator Documentation  Navigator Location: [facility] Onc (05/27/20 1300)   )Navigator Encounter Type: Follow-up Appt;Treatment (05/27/20 1300)                   Treatment Initiated Date: 05/27/20 (05/27/20 1300) Patient Visit Type: MedOnc (05/27/20 1300) Treatment Phase: First Chemo Tx (05/27/20 1300) Barriers/Navigation Needs: No Barriers At This Time;No Needs;No Questions (05/27/20 1300)   Interventions: None Required (05/27/20 1300)               met with patient prior to receiving first chemo treatment today. All questions answered during visit. Informed pt that will be given print out of appts before finishing treatment today. Instructed to call with any further questions or needs. Pt verbalized understanding.       Time Spent with Patient: 30 (05/27/20 1300)

## 2020-05-27 NOTE — Progress Notes (Signed)
Pt reports he is still coughing up blood but it is now bright red vs dark red and seems to be better. 02 sats today in the office 85- per Dr. Janese Banks place pt on 2L 02. Denies difficulty breathing/SOB.

## 2020-05-28 ENCOUNTER — Ambulatory Visit: Admission: RE | Admit: 2020-05-28 | Payer: Medicare Other | Source: Ambulatory Visit

## 2020-05-28 ENCOUNTER — Telehealth: Payer: Self-pay

## 2020-05-28 NOTE — Telephone Encounter (Signed)
T/C to pt for follow up after receiving first chemo yesterday but no answer and did not have a voice mail.  Unable to reach pt.

## 2020-05-29 ENCOUNTER — Ambulatory Visit
Admission: RE | Admit: 2020-05-29 | Discharge: 2020-05-29 | Disposition: A | Discharge status: Home / Self Care | Payer: Medicare Other | Source: Ambulatory Visit | Attending: Radiation Oncology | Admitting: Radiation Oncology

## 2020-05-29 DIAGNOSIS — Z510 Encounter for antineoplastic radiation therapy: Secondary | ICD-10-CM | POA: Diagnosis not present

## 2020-05-30 ENCOUNTER — Ambulatory Visit
Admission: RE | Admit: 2020-05-30 | Discharge: 2020-05-30 | Disposition: A | Discharge status: Home / Self Care | Payer: Medicare Other | Source: Ambulatory Visit | Attending: Radiation Oncology | Admitting: Radiation Oncology

## 2020-05-30 DIAGNOSIS — Z510 Encounter for antineoplastic radiation therapy: Secondary | ICD-10-CM | POA: Diagnosis not present

## 2020-06-02 ENCOUNTER — Ambulatory Visit
Admission: RE | Admit: 2020-06-02 | Discharge: 2020-06-02 | Disposition: A | Discharge status: Home / Self Care | Payer: Medicare Other | Source: Ambulatory Visit | Attending: Radiation Oncology | Admitting: Radiation Oncology

## 2020-06-02 DIAGNOSIS — Z510 Encounter for antineoplastic radiation therapy: Secondary | ICD-10-CM | POA: Diagnosis not present

## 2020-06-03 ENCOUNTER — Other Ambulatory Visit: Payer: Self-pay

## 2020-06-03 ENCOUNTER — Other Ambulatory Visit: Payer: Self-pay | Admitting: Oncology

## 2020-06-03 ENCOUNTER — Ambulatory Visit
Admission: RE | Admit: 2020-06-03 | Discharge: 2020-06-03 | Disposition: A | Discharge status: Home / Self Care | Payer: Medicare Other | Source: Ambulatory Visit | Attending: Radiation Oncology | Admitting: Radiation Oncology

## 2020-06-03 ENCOUNTER — Inpatient Hospital Stay: Payer: Medicare Other

## 2020-06-03 VITALS — BP 128/78 | HR 93 | Temp 97.6°F | Resp 20 | Wt 174.0 lb

## 2020-06-03 DIAGNOSIS — Z5111 Encounter for antineoplastic chemotherapy: Secondary | ICD-10-CM | POA: Diagnosis not present

## 2020-06-03 DIAGNOSIS — Z510 Encounter for antineoplastic radiation therapy: Secondary | ICD-10-CM | POA: Diagnosis not present

## 2020-06-03 DIAGNOSIS — C3401 Malignant neoplasm of right main bronchus: Secondary | ICD-10-CM

## 2020-06-03 LAB — CBC WITH DIFFERENTIAL/PLATELET
Abs Immature Granulocytes: 0.12 10*3/uL — ABNORMAL HIGH (ref 0.00–0.07)
Basophils Absolute: 0 10*3/uL (ref 0.0–0.1)
Basophils Relative: 1 %
Eosinophils Absolute: 0.2 10*3/uL (ref 0.0–0.5)
Eosinophils Relative: 2 %
HCT: 35.5 % — ABNORMAL LOW (ref 39.0–52.0)
Hemoglobin: 11.9 g/dL — ABNORMAL LOW (ref 13.0–17.0)
Immature Granulocytes: 1 %
Lymphocytes Relative: 14 %
Lymphs Abs: 1.2 10*3/uL (ref 0.7–4.0)
MCH: 32.2 pg (ref 26.0–34.0)
MCHC: 33.5 g/dL (ref 30.0–36.0)
MCV: 95.9 fL (ref 80.0–100.0)
Monocytes Absolute: 0.6 10*3/uL (ref 0.1–1.0)
Monocytes Relative: 7 %
Neutro Abs: 6.3 10*3/uL (ref 1.7–7.7)
Neutrophils Relative %: 75 %
Platelets: 283 10*3/uL (ref 150–400)
RBC: 3.7 MIL/uL — ABNORMAL LOW (ref 4.22–5.81)
RDW: 14.2 % (ref 11.5–15.5)
WBC: 8.4 10*3/uL (ref 4.0–10.5)
nRBC: 0 % (ref 0.0–0.2)

## 2020-06-03 LAB — COMPREHENSIVE METABOLIC PANEL
ALT: 16 U/L (ref 0–44)
AST: 15 U/L (ref 15–41)
Albumin: 2.8 g/dL — ABNORMAL LOW (ref 3.5–5.0)
Alkaline Phosphatase: 48 U/L (ref 38–126)
Anion gap: 9 (ref 5–15)
BUN: 32 mg/dL — ABNORMAL HIGH (ref 8–23)
CO2: 25 mmol/L (ref 22–32)
Calcium: 8.7 mg/dL — ABNORMAL LOW (ref 8.9–10.3)
Chloride: 104 mmol/L (ref 98–111)
Creatinine, Ser: 1.85 mg/dL — ABNORMAL HIGH (ref 0.61–1.24)
GFR, Estimated: 39 mL/min — ABNORMAL LOW (ref >60)
Glucose, Bld: 157 mg/dL — ABNORMAL HIGH (ref 70–99)
Potassium: 4 mmol/L (ref 3.5–5.1)
Sodium: 138 mmol/L (ref 135–145)
Total Bilirubin: 0.6 mg/dL (ref 0.3–1.2)
Total Protein: 5.9 g/dL — ABNORMAL LOW (ref 6.5–8.1)

## 2020-06-03 MED ORDER — SODIUM CHLORIDE 0.9 % IV SOLN
Freq: Once | INTRAVENOUS | Status: AC
  Filled 2020-06-03: qty 250

## 2020-06-03 MED ORDER — FAMOTIDINE IN NACL 20-0.9 MG/50ML-% IV SOLN
20.0000 mg | Freq: Once | INTRAVENOUS | Status: AC
  Administered 2020-06-03: 20 mg via INTRAVENOUS
  Filled 2020-06-03: qty 50

## 2020-06-03 MED ORDER — SODIUM CHLORIDE 0.9% FLUSH
10.0000 mL | INTRAVENOUS | Status: DC | PRN
  Administered 2020-06-03: 10 mL via INTRAVENOUS
  Filled 2020-06-03: qty 10

## 2020-06-03 MED ORDER — HEPARIN SOD (PORK) LOCK FLUSH 100 UNIT/ML IV SOLN
INTRAVENOUS | Status: AC
  Filled 2020-06-03: qty 5

## 2020-06-03 MED ORDER — SODIUM CHLORIDE 0.9 % IV SOLN
45.0000 mg/m2 | Freq: Once | INTRAVENOUS | Status: AC
  Administered 2020-06-03: 90 mg via INTRAVENOUS
  Filled 2020-06-03: qty 15

## 2020-06-03 MED ORDER — DIPHENHYDRAMINE HCL 50 MG/ML IJ SOLN
50.0000 mg | Freq: Once | INTRAMUSCULAR | Status: AC
  Administered 2020-06-03: 50 mg via INTRAVENOUS
  Filled 2020-06-03: qty 1

## 2020-06-03 MED ORDER — HEPARIN SOD (PORK) LOCK FLUSH 100 UNIT/ML IV SOLN
500.0000 [IU] | Freq: Once | INTRAVENOUS | Status: AC
  Administered 2020-06-03: 500 [IU] via INTRAVENOUS
  Filled 2020-06-03: qty 5

## 2020-06-03 MED ORDER — SODIUM CHLORIDE 0.9 % IV SOLN
20.0000 mg | Freq: Once | INTRAVENOUS | Status: AC
  Administered 2020-06-03: 20 mg via INTRAVENOUS
  Filled 2020-06-03: qty 20

## 2020-06-03 MED ORDER — PALONOSETRON HCL INJECTION 0.25 MG/5ML
0.2500 mg | Freq: Once | INTRAVENOUS | Status: AC
  Administered 2020-06-03: 0.25 mg via INTRAVENOUS
  Filled 2020-06-03: qty 5

## 2020-06-03 MED ORDER — SODIUM CHLORIDE 0.9 % IV SOLN
132.4000 mg | Freq: Once | INTRAVENOUS | Status: AC
  Administered 2020-06-03: 130 mg via INTRAVENOUS
  Filled 2020-06-03: qty 13

## 2020-06-03 NOTE — Progress Notes (Signed)
Creatinine 1.85. per Dr Janese Banks okay to proceed with treatment today

## 2020-06-04 ENCOUNTER — Ambulatory Visit
Admission: RE | Admit: 2020-06-04 | Discharge: 2020-06-04 | Disposition: A | Discharge status: Home / Self Care | Payer: Medicare Other | Source: Ambulatory Visit | Attending: Radiation Oncology | Admitting: Radiation Oncology

## 2020-06-04 DIAGNOSIS — Z510 Encounter for antineoplastic radiation therapy: Secondary | ICD-10-CM | POA: Diagnosis not present

## 2020-06-05 ENCOUNTER — Ambulatory Visit
Admission: RE | Admit: 2020-06-05 | Discharge: 2020-06-05 | Disposition: A | Discharge status: Home / Self Care | Payer: Medicare Other | Source: Ambulatory Visit | Attending: Radiation Oncology | Admitting: Radiation Oncology

## 2020-06-05 DIAGNOSIS — Z510 Encounter for antineoplastic radiation therapy: Secondary | ICD-10-CM | POA: Diagnosis not present

## 2020-06-06 ENCOUNTER — Ambulatory Visit
Admission: RE | Admit: 2020-06-06 | Discharge: 2020-06-06 | Disposition: A | Discharge status: Home / Self Care | Payer: Medicare Other | Source: Ambulatory Visit | Attending: Radiation Oncology | Admitting: Radiation Oncology

## 2020-06-06 DIAGNOSIS — Z510 Encounter for antineoplastic radiation therapy: Secondary | ICD-10-CM | POA: Diagnosis not present

## 2020-06-09 ENCOUNTER — Ambulatory Visit
Admission: RE | Admit: 2020-06-09 | Discharge: 2020-06-09 | Disposition: A | Discharge status: Home / Self Care | Payer: Medicare Other | Source: Ambulatory Visit | Attending: Radiation Oncology | Admitting: Radiation Oncology

## 2020-06-09 DIAGNOSIS — Z510 Encounter for antineoplastic radiation therapy: Secondary | ICD-10-CM | POA: Diagnosis not present

## 2020-06-10 ENCOUNTER — Inpatient Hospital Stay (HOSPITAL_BASED_OUTPATIENT_CLINIC_OR_DEPARTMENT_OTHER): Payer: Medicare Other | Admitting: Oncology

## 2020-06-10 ENCOUNTER — Inpatient Hospital Stay: Payer: Medicare Other

## 2020-06-10 ENCOUNTER — Encounter: Payer: Self-pay | Admitting: *Deleted

## 2020-06-10 ENCOUNTER — Ambulatory Visit
Admission: RE | Admit: 2020-06-10 | Discharge: 2020-06-10 | Disposition: A | Discharge status: Home / Self Care | Payer: Medicare Other | Source: Ambulatory Visit | Attending: Radiation Oncology | Admitting: Radiation Oncology

## 2020-06-10 ENCOUNTER — Other Ambulatory Visit: Payer: Self-pay

## 2020-06-10 VITALS — BP 124/63 | HR 99 | Temp 97.6°F | Resp 16 | Wt 175.6 lb

## 2020-06-10 DIAGNOSIS — Z510 Encounter for antineoplastic radiation therapy: Secondary | ICD-10-CM | POA: Diagnosis not present

## 2020-06-10 DIAGNOSIS — C3411 Malignant neoplasm of upper lobe, right bronchus or lung: Secondary | ICD-10-CM

## 2020-06-10 DIAGNOSIS — Z5111 Encounter for antineoplastic chemotherapy: Secondary | ICD-10-CM

## 2020-06-10 DIAGNOSIS — R042 Hemoptysis: Secondary | ICD-10-CM | POA: Diagnosis not present

## 2020-06-10 DIAGNOSIS — C3401 Malignant neoplasm of right main bronchus: Secondary | ICD-10-CM

## 2020-06-10 LAB — CBC WITH DIFFERENTIAL/PLATELET
Abs Immature Granulocytes: 0.06 10*3/uL (ref 0.00–0.07)
Basophils Absolute: 0 10*3/uL (ref 0.0–0.1)
Basophils Relative: 1 %
Eosinophils Absolute: 0.1 10*3/uL (ref 0.0–0.5)
Eosinophils Relative: 2 %
HCT: 33.8 % — ABNORMAL LOW (ref 39.0–52.0)
Hemoglobin: 11.1 g/dL — ABNORMAL LOW (ref 13.0–17.0)
Immature Granulocytes: 1 %
Lymphocytes Relative: 14 %
Lymphs Abs: 0.7 10*3/uL (ref 0.7–4.0)
MCH: 31.9 pg (ref 26.0–34.0)
MCHC: 32.8 g/dL (ref 30.0–36.0)
MCV: 97.1 fL (ref 80.0–100.0)
Monocytes Absolute: 0.4 10*3/uL (ref 0.1–1.0)
Monocytes Relative: 7 %
Neutro Abs: 3.8 10*3/uL (ref 1.7–7.7)
Neutrophils Relative %: 75 %
Platelets: 201 10*3/uL (ref 150–400)
RBC: 3.48 MIL/uL — ABNORMAL LOW (ref 4.22–5.81)
RDW: 14.6 % (ref 11.5–15.5)
WBC: 5 10*3/uL (ref 4.0–10.5)
nRBC: 0 % (ref 0.0–0.2)

## 2020-06-10 LAB — COMPREHENSIVE METABOLIC PANEL
ALT: 18 U/L (ref 0–44)
AST: 15 U/L (ref 15–41)
Albumin: 2.7 g/dL — ABNORMAL LOW (ref 3.5–5.0)
Alkaline Phosphatase: 45 U/L (ref 38–126)
Anion gap: 8 (ref 5–15)
BUN: 26 mg/dL — ABNORMAL HIGH (ref 8–23)
CO2: 23 mmol/L (ref 22–32)
Calcium: 8.2 mg/dL — ABNORMAL LOW (ref 8.9–10.3)
Chloride: 107 mmol/L (ref 98–111)
Creatinine, Ser: 1.61 mg/dL — ABNORMAL HIGH (ref 0.61–1.24)
GFR, Estimated: 46 mL/min — ABNORMAL LOW (ref >60)
Glucose, Bld: 153 mg/dL — ABNORMAL HIGH (ref 70–99)
Potassium: 3.9 mmol/L (ref 3.5–5.1)
Sodium: 138 mmol/L (ref 135–145)
Total Bilirubin: 0.7 mg/dL (ref 0.3–1.2)
Total Protein: 5.7 g/dL — ABNORMAL LOW (ref 6.5–8.1)

## 2020-06-10 MED ORDER — SODIUM CHLORIDE 0.9 % IV SOLN
144.6000 mg | Freq: Once | INTRAVENOUS | Status: AC
  Administered 2020-06-10: 140 mg via INTRAVENOUS
  Filled 2020-06-10: qty 14

## 2020-06-10 MED ORDER — HEPARIN SOD (PORK) LOCK FLUSH 100 UNIT/ML IV SOLN
INTRAVENOUS | Status: AC
  Filled 2020-06-10: qty 5

## 2020-06-10 MED ORDER — HEPARIN SOD (PORK) LOCK FLUSH 100 UNIT/ML IV SOLN
500.0000 [IU] | Freq: Once | INTRAVENOUS | Status: AC
  Administered 2020-06-10: 500 [IU] via INTRAVENOUS
  Filled 2020-06-10: qty 5

## 2020-06-10 MED ORDER — SODIUM CHLORIDE 0.9% FLUSH
10.0000 mL | INTRAVENOUS | Status: DC | PRN
  Administered 2020-06-10: 10 mL via INTRAVENOUS
  Filled 2020-06-10: qty 10

## 2020-06-10 MED ORDER — SODIUM CHLORIDE 0.9 % IV SOLN
Freq: Once | INTRAVENOUS | Status: AC
  Filled 2020-06-10: qty 250

## 2020-06-10 MED ORDER — SODIUM CHLORIDE 0.9 % IV SOLN
45.0000 mg/m2 | Freq: Once | INTRAVENOUS | Status: AC
  Administered 2020-06-10: 90 mg via INTRAVENOUS
  Filled 2020-06-10: qty 15

## 2020-06-10 MED ORDER — PALONOSETRON HCL INJECTION 0.25 MG/5ML
0.2500 mg | Freq: Once | INTRAVENOUS | Status: AC
  Administered 2020-06-10: 0.25 mg via INTRAVENOUS
  Filled 2020-06-10: qty 5

## 2020-06-10 MED ORDER — DIPHENHYDRAMINE HCL 50 MG/ML IJ SOLN
50.0000 mg | Freq: Once | INTRAMUSCULAR | Status: AC
  Administered 2020-06-10: 50 mg via INTRAVENOUS
  Filled 2020-06-10: qty 1

## 2020-06-10 MED ORDER — SODIUM CHLORIDE 0.9 % IV SOLN
20.0000 mg | Freq: Once | INTRAVENOUS | Status: AC
  Administered 2020-06-10: 20 mg via INTRAVENOUS
  Filled 2020-06-10: qty 20

## 2020-06-10 MED ORDER — FAMOTIDINE IN NACL 20-0.9 MG/50ML-% IV SOLN
20.0000 mg | Freq: Once | INTRAVENOUS | Status: AC
  Administered 2020-06-10: 20 mg via INTRAVENOUS
  Filled 2020-06-10: qty 50

## 2020-06-10 NOTE — Progress Notes (Signed)
  Oncology Nurse Navigator Documentation  Navigator Location: [facility] Onc (06/10/20 1300)   )Navigator Encounter Type: Appt/Treatment Plan Review (06/10/20 1300)                         Barriers/Navigation Needs: No Barriers At This Time;No Needs (06/10/20 1300)   Interventions: None Required (06/10/20 1300)                      Time Spent with Patient: 15 (06/10/20 1300)

## 2020-06-10 NOTE — Progress Notes (Signed)
Cr 1.61. Per Dr. Janese Banks, okay to proceed with treatment.

## 2020-06-10 NOTE — Progress Notes (Signed)
Hematology/Oncology Consult note [facility]  Telephone:([phone] [phone]  Patient Care Team: Perrin Maltese, MD as PCP - General (Internal Medicine) Telford Nab, RN as Oncology Nurse Navigator Sindy Guadeloupe, MD as Consulting Physician (Hematology and Oncology)   Name of the patient: Martin Jennings  [id number]  07-03-1951   Date of visit: 06/10/20  Diagnosis- stage IIIc adenocarcinoma of the lung cT3 cN3 M0   Chief complaint/ Reason for visit-on treatment assessment prior to cycle 3 of weekly carbotaxol chemotherapy  Heme/Onc history: Patient is a 69 year old male with a history of tobacco dependence who was admitted to the hospital for symptoms of acute onset shortness of breath and was found to have Covid pneumonia. He underwent CT angio chest which showed extensive mediastinal as well as bilateral hilar adenopathy. Masslike opacity with architectural distortion involving the right upper lobe measuring 5.7 x 2.9 x 2.3 cm and groundglass opacities bilaterally. Mass involving the left adrenal gland as well as a smaller right adrenal gland possibly indicating adrenal metastases.  This was followed by a PET CT scan 2 weeks later which showed the consolidative process in the right upper lobe suv 7.88 concerning for infiltrating tumor. It appears to involve the pleura associated interstitial thickening was also seen. Bulky right hilar and mediastinal adenopathy. Enlarged somewhat necrotic appearing left supraclavicular lymph node was also hypermetabolic. Adrenal gland was not hypermetabolic and was consistent with a benign adenoma. No metastatic disease involving liver. No features of distant metastatic disease.  Pathology showed metastatic non-small cell lung cancer which was TTF-1 positive consistent with adenocarcinoma.   Interval history-he remains on oxygen since his last hospitalization.  Reports occasional episodes of hemoptysis  as well as intermittent nosebleeds.  Reports that he has a hard time sleeping on the day of chemotherapy but otherwise he has been sleeping well.  Denies any pain  ECOG PS- 2 Pain scale- 0   Review of systems- Review of Systems  Constitutional: Positive for malaise/fatigue. Negative for chills, fever and weight loss.  HENT: Negative for congestion, ear discharge and nosebleeds.   Eyes: Negative for blurred vision.  Respiratory: Negative for cough, hemoptysis, sputum production, shortness of breath and wheezing.   Cardiovascular: Negative for chest pain, palpitations, orthopnea and claudication.  Gastrointestinal: Negative for abdominal pain, blood in stool, constipation, diarrhea, heartburn, melena, nausea and vomiting.  Genitourinary: Negative for dysuria, flank pain, frequency, hematuria and urgency.  Musculoskeletal: Negative for back pain, joint pain and myalgias.  Skin: Negative for rash.  Neurological: Negative for dizziness, tingling, focal weakness, seizures, weakness and headaches.  Endo/Heme/Allergies: Does not bruise/bleed easily.  Psychiatric/Behavioral: Negative for depression and suicidal ideas. The patient does not have insomnia.     No Known Allergies   Past Medical History:  Diagnosis Date  . Bladder cancer ([place]) 2012  . COVID-19 virus infection 04/03/2020  . Hypertension   . Lung cancer ([place])   . Peripheral vascular disease [facility])      Past Surgical History:  Procedure Laterality Date  . BLADDER REMOVAL    . LOWER EXTREMITY ANGIOGRAPHY Left 04/12/2017   Procedure: Lower Extremity Angiography;  Surgeon: Katha Cabal, MD;  Location: [place] [facility];  Service: Cardiovascular;  Laterality: Left;  . PORTA CATH INSERTION N/A 05/22/2020   Procedure: PORTA CATH INSERTION;  Surgeon: Algernon Huxley, MD;  Location: [place] [facility];  Service: Cardiovascular;  Laterality: N/A;  . uretostomy    . VASCULAR SURGERY  Social History   Socioeconomic History    . Marital status: Married    Spouse name: Not on file  . Number of children: Not on file  . Years of education: Not on file  . Highest education level: Not on file  Occupational History  . Not on file  Tobacco Use  . Smoking status: Former Smoker    Packs/day: 1.25    Years: 50.00    Pack years: 62.50    Types: Cigarettes    Quit date: 03/13/2020    Years since quitting: 0.2  . Smokeless tobacco: Never Used  Vaping Use  . Vaping Use: Never used  Substance and Sexual Activity  . Alcohol use: No  . Drug use: Not Currently    Types: Marijuana  . Sexual activity: Not Currently  Other Topics Concern  . Not on file  Social History Narrative  . Not on file   Social Determinants of Health   Financial Resource Strain:   . Difficulty of Paying Living Expenses: Not on file  Food Insecurity:   . Worried About Charity fundraiser in the Last Year: Not on file  . Ran Out of Food in the Last Year: Not on file  Transportation Needs:   . Lack of Transportation (Medical): Not on file  . Lack of Transportation (Non-Medical): Not on file  Physical Activity:   . Days of Exercise per Week: Not on file  . Minutes of Exercise per Session: Not on file  Stress:   . Feeling of Stress : Not on file  Social Connections:   . Frequency of Communication with Friends and Family: Not on file  . Frequency of Social Gatherings with Friends and Family: Not on file  . Attends Religious Services: Not on file  . Active Member of Clubs or Organizations: Not on file  . Attends Archivist Meetings: Not on file  . Marital Status: Not on file  Intimate Partner Violence:   . Fear of Current or Ex-Partner: Not on file  . Emotionally Abused: Not on file  . Physically Abused: Not on file  . Sexually Abused: Not on file    No family history on file.   Current Outpatient Medications:  .  amLODipine (NORVASC) 10 MG tablet, Take 10 mg by mouth daily. , Disp: , Rfl:  .  aspirin EC 81 MG tablet,  Take 81 mg by mouth daily.  (Patient not taking: Reported on 05/15/2020), Disp: , Rfl:  .  atorvastatin (LIPITOR) 20 MG tablet, Take 20 mg by mouth every evening. , Disp: , Rfl:  .  clopidogrel (PLAVIX) 75 MG tablet, Take 75 mg by mouth daily. , Disp: , Rfl:  .  dexamethasone (DECADRON) 4 MG tablet, Take 2 tablets (8 mg total) by mouth daily. Start the day after chemotherapy for 2 days., Disp: 30 tablet, Rfl: 1 .  lidocaine-prilocaine (EMLA) cream, Apply to affected area once, Disp: 30 g, Rfl: 3 .  LORazepam (ATIVAN) 0.5 MG tablet, Take 1 tablet (0.5 mg total) by mouth every 6 (six) hours as needed (Nausea or vomiting). (Patient not taking: Reported on 05/27/2020), Disp: 30 tablet, Rfl: 0 .  ondansetron (ZOFRAN) 8 MG tablet, Take 1 tablet (8 mg total) by mouth 2 (two) times daily as needed for refractory nausea / vomiting. Start on day 3 after chemo. (Patient not taking: Reported on 05/27/2020), Disp: 30 tablet, Rfl: 1 .  prochlorperazine (COMPAZINE) 10 MG tablet, Take 1 tablet (10 mg total) by mouth  every 6 (six) hours as needed (Nausea or vomiting). (Patient not taking: Reported on 05/27/2020), Disp: 30 tablet, Rfl: 1 No current facility-administered medications for this visit.  Facility-Administered Medications Ordered in Other Visits:  .  heparin lock flush 100 unit/mL, 500 Units, Intravenous, Once, Randa Evens C, MD .  sodium chloride flush (NS) 0.9 % injection 10 mL, 10 mL, Intravenous, PRN, Sindy Guadeloupe, MD, 10 mL at 06/10/20 0822  Physical exam:  Vitals:   06/10/20 0925  BP: 124/63  Pulse: 99  Resp: 16  Temp: 97.6 F (36.4 C)  TempSrc: Tympanic  SpO2: (!) 89%  Weight: 175 lb 9.6 oz (79.7 kg)   Physical Exam Constitutional:      Comments: On home oxygen  HENT:     Head: Atraumatic.  Cardiovascular:     Rate and Rhythm: Normal rate and regular rhythm.     Heart sounds: Normal heart sounds.  Pulmonary:     Effort: Pulmonary effort is normal.     Breath sounds: Normal breath  sounds.  Abdominal:     General: Bowel sounds are normal.     Palpations: Abdomen is soft.  Skin:    General: Skin is warm and dry.  Neurological:     Mental Status: He is alert and oriented to person, place, and time.      CMP Latest Ref Rng & Units 06/03/2020  Glucose 70 - 99 mg/dL 157(H)  BUN 8 - 23 mg/dL 32(H)  Creatinine 0.61 - 1.24 mg/dL 1.85(H)  Sodium 135 - 145 mmol/L 138  Potassium 3.5 - 5.1 mmol/L 4.0  Chloride 98 - 111 mmol/L 104  CO2 22 - 32 mmol/L 25  Calcium 8.9 - 10.3 mg/dL 8.7(L)  Total Protein 6.5 - 8.1 g/dL 5.9(L)  Total Bilirubin 0.3 - 1.2 mg/dL 0.6  Alkaline Phos 38 - 126 U/L 48  AST 15 - 41 U/L 15  ALT 0 - 44 U/L 16   CBC Latest Ref Rng & Units 06/03/2020  WBC 4.0 - 10.5 K/uL 8.4  Hemoglobin 13.0 - 17.0 g/dL 11.9(L)  Hematocrit 39 - 52 % 35.5(L)  Platelets 150 - 400 K/uL 283    No images are attached to the encounter.  PERIPHERAL VASCULAR CATHETERIZATION  Result Date: 05/22/2020 See op note    Assessment and plan- Patient is a 70 y.o. male  with diagnosed stage IIIc adenocarcinoma of the right upper lobe of the lung cT4 N3 M0. He is here for on treatment assessment prior to cycle 3 of weekly carbotaxol chemotherapy  Counts okay to proceed with cycle 3 of weekly carbotaxol chemotherapy today. He will proceed with cycle 4 next week and I will see him back in 2 weeks for cycle 5.  He is tolerating chemotherapy well without any significant nausea or vomiting.  Hemoptysis: Likely secondary to underlying lung cancer.  Hopefully it should get better after chemoradiation.  It is not hemodynamically significant at this time.  Dr. Patsey Berthold is also aware about this and if this were to become severe she would consider laser therapy for him.  Insomnia: This is only on the day of chemotherapy and I will therefore hold off on starting any additional medications at this time.  Nosebleeds: I have recommended that patient should use a humidifier when he sleeps and  also over-the-counter nasal saline spray    Visit Diagnosis 1. Encounter for antineoplastic chemotherapy   2. Malignant neoplasm of upper lobe of right lung ([place])   3. Hemoptysis  Dr. Randa Evens, MD, MPH [facility] [phone] 06/10/2020 8:34 AM

## 2020-06-10 NOTE — Progress Notes (Signed)
Cr 1.61 ok with MD to proceed with treatment today

## 2020-06-11 ENCOUNTER — Ambulatory Visit
Admission: RE | Admit: 2020-06-11 | Discharge: 2020-06-11 | Disposition: A | Discharge status: Home / Self Care | Payer: Medicare Other | Source: Ambulatory Visit | Attending: Radiation Oncology | Admitting: Radiation Oncology

## 2020-06-11 DIAGNOSIS — Z510 Encounter for antineoplastic radiation therapy: Secondary | ICD-10-CM | POA: Diagnosis not present

## 2020-06-12 ENCOUNTER — Ambulatory Visit
Admission: RE | Admit: 2020-06-12 | Discharge: 2020-06-12 | Disposition: A | Discharge status: Home / Self Care | Payer: Medicare Other | Source: Ambulatory Visit | Attending: Radiation Oncology | Admitting: Radiation Oncology

## 2020-06-12 DIAGNOSIS — Z510 Encounter for antineoplastic radiation therapy: Secondary | ICD-10-CM | POA: Diagnosis not present

## 2020-06-13 ENCOUNTER — Ambulatory Visit
Admission: RE | Admit: 2020-06-13 | Discharge: 2020-06-13 | Disposition: A | Discharge status: Home / Self Care | Payer: Medicare Other | Source: Ambulatory Visit | Attending: Radiation Oncology | Admitting: Radiation Oncology

## 2020-06-13 DIAGNOSIS — Z510 Encounter for antineoplastic radiation therapy: Secondary | ICD-10-CM | POA: Diagnosis not present

## 2020-06-16 ENCOUNTER — Other Ambulatory Visit: Payer: Self-pay | Admitting: *Deleted

## 2020-06-16 ENCOUNTER — Ambulatory Visit
Admission: RE | Admit: 2020-06-16 | Discharge: 2020-06-16 | Disposition: A | Discharge status: Home / Self Care | Payer: Medicare Other | Source: Ambulatory Visit | Attending: Radiation Oncology | Admitting: Radiation Oncology

## 2020-06-16 DIAGNOSIS — Z510 Encounter for antineoplastic radiation therapy: Secondary | ICD-10-CM | POA: Diagnosis not present

## 2020-06-16 MED ORDER — SUCRALFATE 1 G PO TABS: 1.0000 g | ORAL_TABLET | Freq: Three times a day (TID) | ORAL | 1 refills | Status: DC

## 2020-06-17 ENCOUNTER — Other Ambulatory Visit: Payer: Self-pay

## 2020-06-17 ENCOUNTER — Inpatient Hospital Stay: Payer: Medicare Other

## 2020-06-17 ENCOUNTER — Ambulatory Visit
Admission: RE | Admit: 2020-06-17 | Discharge: 2020-06-17 | Disposition: A | Discharge status: Home / Self Care | Payer: Medicare Other | Source: Ambulatory Visit | Attending: Radiation Oncology | Admitting: Radiation Oncology

## 2020-06-17 VITALS — BP 112/67 | HR 75 | Temp 97.4°F | Resp 18

## 2020-06-17 DIAGNOSIS — Z5111 Encounter for antineoplastic chemotherapy: Secondary | ICD-10-CM | POA: Diagnosis not present

## 2020-06-17 DIAGNOSIS — Z510 Encounter for antineoplastic radiation therapy: Secondary | ICD-10-CM | POA: Diagnosis not present

## 2020-06-17 DIAGNOSIS — C3401 Malignant neoplasm of right main bronchus: Secondary | ICD-10-CM

## 2020-06-17 LAB — CBC WITH DIFFERENTIAL/PLATELET
Abs Immature Granulocytes: 0.04 10*3/uL (ref 0.00–0.07)
Basophils Absolute: 0 10*3/uL (ref 0.0–0.1)
Basophils Relative: 1 %
Eosinophils Absolute: 0.1 10*3/uL (ref 0.0–0.5)
Eosinophils Relative: 3 %
HCT: 32.4 % — ABNORMAL LOW (ref 39.0–52.0)
Hemoglobin: 10.8 g/dL — ABNORMAL LOW (ref 13.0–17.0)
Immature Granulocytes: 1 %
Lymphocytes Relative: 14 %
Lymphs Abs: 0.5 10*3/uL — ABNORMAL LOW (ref 0.7–4.0)
MCH: 32.3 pg (ref 26.0–34.0)
MCHC: 33.3 g/dL (ref 30.0–36.0)
MCV: 97 fL (ref 80.0–100.0)
Monocytes Absolute: 0.3 10*3/uL (ref 0.1–1.0)
Monocytes Relative: 10 %
Neutro Abs: 2.5 10*3/uL (ref 1.7–7.7)
Neutrophils Relative %: 71 %
Platelets: 188 10*3/uL (ref 150–400)
RBC: 3.34 MIL/uL — ABNORMAL LOW (ref 4.22–5.81)
RDW: 14.8 % (ref 11.5–15.5)
WBC: 3.4 10*3/uL — ABNORMAL LOW (ref 4.0–10.5)
nRBC: 0 % (ref 0.0–0.2)

## 2020-06-17 LAB — COMPREHENSIVE METABOLIC PANEL
ALT: 13 U/L (ref 0–44)
AST: 13 U/L — ABNORMAL LOW (ref 15–41)
Albumin: 2.9 g/dL — ABNORMAL LOW (ref 3.5–5.0)
Alkaline Phosphatase: 46 U/L (ref 38–126)
Anion gap: 10 (ref 5–15)
BUN: 22 mg/dL (ref 8–23)
CO2: 24 mmol/L (ref 22–32)
Calcium: 8.1 mg/dL — ABNORMAL LOW (ref 8.9–10.3)
Chloride: 104 mmol/L (ref 98–111)
Creatinine, Ser: 1.69 mg/dL — ABNORMAL HIGH (ref 0.61–1.24)
GFR, Estimated: 43 mL/min — ABNORMAL LOW (ref >60)
Glucose, Bld: 123 mg/dL — ABNORMAL HIGH (ref 70–99)
Potassium: 3.8 mmol/L (ref 3.5–5.1)
Sodium: 138 mmol/L (ref 135–145)
Total Bilirubin: 0.6 mg/dL (ref 0.3–1.2)
Total Protein: 5.8 g/dL — ABNORMAL LOW (ref 6.5–8.1)

## 2020-06-17 MED ORDER — FAMOTIDINE IN NACL 20-0.9 MG/50ML-% IV SOLN
20.0000 mg | Freq: Once | INTRAVENOUS | Status: AC
  Administered 2020-06-17: 20 mg via INTRAVENOUS
  Filled 2020-06-17: qty 50

## 2020-06-17 MED ORDER — SODIUM CHLORIDE 0.9 % IV SOLN
20.0000 mg | Freq: Once | INTRAVENOUS | Status: AC
  Administered 2020-06-17: 20 mg via INTRAVENOUS
  Filled 2020-06-17: qty 20

## 2020-06-17 MED ORDER — SODIUM CHLORIDE 0.9 % IV SOLN
140.2000 mg | Freq: Once | INTRAVENOUS | Status: AC
  Administered 2020-06-17: 140 mg via INTRAVENOUS
  Filled 2020-06-17: qty 14

## 2020-06-17 MED ORDER — HEPARIN SOD (PORK) LOCK FLUSH 100 UNIT/ML IV SOLN
500.0000 [IU] | Freq: Once | INTRAVENOUS | Status: AC | PRN
  Administered 2020-06-17: 500 [IU]
  Filled 2020-06-17: qty 5

## 2020-06-17 MED ORDER — SODIUM CHLORIDE 0.9 % IV SOLN
Freq: Once | INTRAVENOUS | Status: AC
  Filled 2020-06-17: qty 250

## 2020-06-17 MED ORDER — PALONOSETRON HCL INJECTION 0.25 MG/5ML
0.2500 mg | Freq: Once | INTRAVENOUS | Status: AC
  Administered 2020-06-17: 0.25 mg via INTRAVENOUS
  Filled 2020-06-17: qty 5

## 2020-06-17 MED ORDER — SODIUM CHLORIDE 0.9 % IV SOLN
45.0000 mg/m2 | Freq: Once | INTRAVENOUS | Status: AC
  Administered 2020-06-17: 90 mg via INTRAVENOUS
  Filled 2020-06-17: qty 15

## 2020-06-17 MED ORDER — SODIUM CHLORIDE 0.9% FLUSH
10.0000 mL | Freq: Once | INTRAVENOUS | Status: AC
  Administered 2020-06-17: 10 mL via INTRAVENOUS
  Filled 2020-06-17: qty 10

## 2020-06-17 MED ORDER — DIPHENHYDRAMINE HCL 50 MG/ML IJ SOLN
50.0000 mg | Freq: Once | INTRAMUSCULAR | Status: AC
  Administered 2020-06-17: 50 mg via INTRAVENOUS
  Filled 2020-06-17: qty 1

## 2020-06-17 NOTE — Progress Notes (Signed)
Per Dr Janese Banks, ok to proceed with tx today, crt 1.69

## 2020-06-17 NOTE — Progress Notes (Signed)
Pt received taxol carbo tx today. Tolerated well.

## 2020-06-18 ENCOUNTER — Ambulatory Visit
Admission: RE | Admit: 2020-06-18 | Discharge: 2020-06-18 | Disposition: A | Discharge status: Home / Self Care | Payer: Medicare Other | Source: Ambulatory Visit | Attending: Radiation Oncology | Admitting: Radiation Oncology

## 2020-06-18 DIAGNOSIS — Z510 Encounter for antineoplastic radiation therapy: Secondary | ICD-10-CM | POA: Diagnosis not present

## 2020-06-23 ENCOUNTER — Ambulatory Visit
Admission: RE | Admit: 2020-06-23 | Discharge: 2020-06-23 | Disposition: A | Discharge status: Home / Self Care | Payer: Medicare Other | Source: Ambulatory Visit | Attending: Radiation Oncology | Admitting: Radiation Oncology

## 2020-06-23 DIAGNOSIS — Z510 Encounter for antineoplastic radiation therapy: Secondary | ICD-10-CM | POA: Diagnosis not present

## 2020-06-24 ENCOUNTER — Inpatient Hospital Stay: Payer: Medicare Other

## 2020-06-24 ENCOUNTER — Other Ambulatory Visit: Payer: Self-pay | Admitting: *Deleted

## 2020-06-24 ENCOUNTER — Ambulatory Visit
Admission: RE | Admit: 2020-06-24 | Discharge: 2020-06-24 | Disposition: A | Discharge status: Home / Self Care | Payer: Medicare Other | Source: Ambulatory Visit | Attending: Radiation Oncology | Admitting: Radiation Oncology

## 2020-06-24 ENCOUNTER — Other Ambulatory Visit: Payer: Self-pay

## 2020-06-24 ENCOUNTER — Encounter: Payer: Self-pay | Admitting: Oncology

## 2020-06-24 ENCOUNTER — Inpatient Hospital Stay (HOSPITAL_BASED_OUTPATIENT_CLINIC_OR_DEPARTMENT_OTHER): Payer: Medicare Other | Admitting: Oncology

## 2020-06-24 ENCOUNTER — Telehealth: Payer: Self-pay | Admitting: *Deleted

## 2020-06-24 VITALS — BP 123/73 | HR 89 | Temp 96.8°F | Resp 16 | Wt 171.4 lb

## 2020-06-24 DIAGNOSIS — Z5111 Encounter for antineoplastic chemotherapy: Secondary | ICD-10-CM

## 2020-06-24 DIAGNOSIS — K208 Other esophagitis without bleeding: Secondary | ICD-10-CM

## 2020-06-24 DIAGNOSIS — T451X5A Adverse effect of antineoplastic and immunosuppressive drugs, initial encounter: Secondary | ICD-10-CM | POA: Diagnosis not present

## 2020-06-24 DIAGNOSIS — T66XXXA Radiation sickness, unspecified, initial encounter: Secondary | ICD-10-CM

## 2020-06-24 DIAGNOSIS — D701 Agranulocytosis secondary to cancer chemotherapy: Secondary | ICD-10-CM

## 2020-06-24 DIAGNOSIS — C3401 Malignant neoplasm of right main bronchus: Secondary | ICD-10-CM

## 2020-06-24 DIAGNOSIS — Z510 Encounter for antineoplastic radiation therapy: Secondary | ICD-10-CM | POA: Diagnosis not present

## 2020-06-24 LAB — CBC WITH DIFFERENTIAL/PLATELET
Abs Immature Granulocytes: 0.02 10*3/uL (ref 0.00–0.07)
Basophils Absolute: 0 10*3/uL (ref 0.0–0.1)
Basophils Relative: 0 %
Eosinophils Absolute: 0.1 10*3/uL (ref 0.0–0.5)
Eosinophils Relative: 2 %
HCT: 29.7 % — ABNORMAL LOW (ref 39.0–52.0)
Hemoglobin: 9.9 g/dL — ABNORMAL LOW (ref 13.0–17.0)
Immature Granulocytes: 1 %
Lymphocytes Relative: 18 %
Lymphs Abs: 0.5 10*3/uL — ABNORMAL LOW (ref 0.7–4.0)
MCH: 32.5 pg (ref 26.0–34.0)
MCHC: 33.3 g/dL (ref 30.0–36.0)
MCV: 97.4 fL (ref 80.0–100.0)
Monocytes Absolute: 0.3 10*3/uL (ref 0.1–1.0)
Monocytes Relative: 10 %
Neutro Abs: 1.8 10*3/uL (ref 1.7–7.7)
Neutrophils Relative %: 69 %
Platelets: 144 10*3/uL — ABNORMAL LOW (ref 150–400)
RBC: 3.05 MIL/uL — ABNORMAL LOW (ref 4.22–5.81)
RDW: 15.2 % (ref 11.5–15.5)
WBC: 2.6 10*3/uL — ABNORMAL LOW (ref 4.0–10.5)
nRBC: 0 % (ref 0.0–0.2)

## 2020-06-24 LAB — COMPREHENSIVE METABOLIC PANEL
ALT: 11 U/L (ref 0–44)
AST: 15 U/L (ref 15–41)
Albumin: 2.9 g/dL — ABNORMAL LOW (ref 3.5–5.0)
Alkaline Phosphatase: 44 U/L (ref 38–126)
Anion gap: 6 (ref 5–15)
BUN: 27 mg/dL — ABNORMAL HIGH (ref 8–23)
CO2: 27 mmol/L (ref 22–32)
Calcium: 7.8 mg/dL — ABNORMAL LOW (ref 8.9–10.3)
Chloride: 106 mmol/L (ref 98–111)
Creatinine, Ser: 1.34 mg/dL — ABNORMAL HIGH (ref 0.61–1.24)
GFR, Estimated: 57 mL/min — ABNORMAL LOW (ref >60)
Glucose, Bld: 127 mg/dL — ABNORMAL HIGH (ref 70–99)
Potassium: 3.8 mmol/L (ref 3.5–5.1)
Sodium: 139 mmol/L (ref 135–145)
Total Bilirubin: 0.8 mg/dL (ref 0.3–1.2)
Total Protein: 5.9 g/dL — ABNORMAL LOW (ref 6.5–8.1)

## 2020-06-24 MED ORDER — OXYCODONE HCL 5 MG PO TABS
5.0000 mg | ORAL_TABLET | Freq: Three times a day (TID) | ORAL | 0 refills | Status: DC | PRN
Start: 2020-06-24 — End: 2020-06-24

## 2020-06-24 MED ORDER — SODIUM CHLORIDE 0.9 % IV SOLN
Freq: Once | INTRAVENOUS | Status: AC
  Filled 2020-06-24: qty 250

## 2020-06-24 MED ORDER — HEPARIN SOD (PORK) LOCK FLUSH 100 UNIT/ML IV SOLN
INTRAVENOUS | Status: AC
  Filled 2020-06-24: qty 5

## 2020-06-24 MED ORDER — OXYCODONE HCL 5 MG PO TABS: 5.0000 mg | ORAL_TABLET | Freq: Three times a day (TID) | ORAL | 0 refills | Status: DC | PRN

## 2020-06-24 MED ORDER — PALONOSETRON HCL INJECTION 0.25 MG/5ML
0.2500 mg | Freq: Once | INTRAVENOUS | Status: AC
  Administered 2020-06-24: 0.25 mg via INTRAVENOUS
  Filled 2020-06-24: qty 5

## 2020-06-24 MED ORDER — HEPARIN SOD (PORK) LOCK FLUSH 100 UNIT/ML IV SOLN
500.0000 [IU] | Freq: Once | INTRAVENOUS | Status: AC | PRN
  Administered 2020-06-24: 500 [IU]
  Filled 2020-06-24: qty 5

## 2020-06-24 MED ORDER — SODIUM CHLORIDE 0.9 % IV SOLN
122.8500 mg | Freq: Once | INTRAVENOUS | Status: AC
  Administered 2020-06-24: 120 mg via INTRAVENOUS
  Filled 2020-06-24: qty 12

## 2020-06-24 MED ORDER — SODIUM CHLORIDE 0.9 % IV SOLN
45.0000 mg/m2 | Freq: Once | INTRAVENOUS | Status: AC
  Administered 2020-06-24: 90 mg via INTRAVENOUS
  Filled 2020-06-24: qty 15

## 2020-06-24 MED ORDER — DIPHENHYDRAMINE HCL 50 MG/ML IJ SOLN
50.0000 mg | Freq: Once | INTRAMUSCULAR | Status: AC
  Administered 2020-06-24: 50 mg via INTRAVENOUS
  Filled 2020-06-24: qty 1

## 2020-06-24 MED ORDER — LIDOCAINE-PRILOCAINE 2.5-2.5 % EX CREA: TOPICAL_CREAM | CUTANEOUS | 3 refills | Status: DC

## 2020-06-24 MED ORDER — SODIUM CHLORIDE 0.9 % IV SOLN
20.0000 mg | Freq: Once | INTRAVENOUS | Status: AC
  Administered 2020-06-24: 20 mg via INTRAVENOUS
  Filled 2020-06-24: qty 20

## 2020-06-24 MED ORDER — FAMOTIDINE IN NACL 20-0.9 MG/50ML-% IV SOLN
20.0000 mg | Freq: Once | INTRAVENOUS | Status: AC
  Administered 2020-06-24: 20 mg via INTRAVENOUS
  Filled 2020-06-24: qty 50

## 2020-06-24 MED ORDER — SODIUM CHLORIDE 0.9% FLUSH
10.0000 mL | Freq: Once | INTRAVENOUS | Status: AC
  Administered 2020-06-24: 10 mL via INTRAVENOUS
  Filled 2020-06-24: qty 10

## 2020-06-24 NOTE — Telephone Encounter (Signed)
Pharmacy called stating that they cannot fill Oxycodone prescription sent to them as they are NOT a mail order pharmacy and only serve [employer]. She is cancelling the prescription and said she thinks it probably was meant to be sent to a local pharmacy. Please resubmit to appropriate pharmacy

## 2020-06-24 NOTE — Progress Notes (Signed)
Patient tolerated chemo infusion well, no concerns voiced. Patient discharged home. Stable.

## 2020-06-24 NOTE — Telephone Encounter (Signed)
  I believe this is Dr Elroy Channel patient.  M

## 2020-06-25 ENCOUNTER — Ambulatory Visit
Admission: RE | Admit: 2020-06-25 | Discharge: 2020-06-25 | Disposition: A | Discharge status: Home / Self Care | Payer: Medicare Other | Source: Ambulatory Visit | Attending: Radiation Oncology | Admitting: Radiation Oncology

## 2020-06-25 ENCOUNTER — Ambulatory Visit: Payer: Medicare Other | Admitting: Pulmonary Disease

## 2020-06-25 DIAGNOSIS — C3411 Malignant neoplasm of upper lobe, right bronchus or lung: Secondary | ICD-10-CM | POA: Insufficient documentation

## 2020-06-25 DIAGNOSIS — T451X5A Adverse effect of antineoplastic and immunosuppressive drugs, initial encounter: Secondary | ICD-10-CM | POA: Insufficient documentation

## 2020-06-25 DIAGNOSIS — Z510 Encounter for antineoplastic radiation therapy: Secondary | ICD-10-CM | POA: Insufficient documentation

## 2020-06-26 ENCOUNTER — Ambulatory Visit
Admission: RE | Admit: 2020-06-26 | Discharge: 2020-06-26 | Disposition: A | Discharge status: Home / Self Care | Payer: Medicare Other | Source: Ambulatory Visit | Attending: Radiation Oncology | Admitting: Radiation Oncology

## 2020-06-26 DIAGNOSIS — Z510 Encounter for antineoplastic radiation therapy: Secondary | ICD-10-CM | POA: Diagnosis not present

## 2020-06-26 NOTE — Progress Notes (Signed)
Hematology/Oncology Consult note [facility]  [phone] [phone]  Patient Care Team: Perrin Maltese, MD as PCP - General (Internal Medicine) Telford Nab, RN as Oncology Nurse Navigator Sindy Guadeloupe, MD as Consulting Physician (Hematology and Oncology)   Name of the patient: Martin Jennings  [id number]  [birth year]   Date of visit: 06/26/20  Diagnosis- stage IIIc adenocarcinoma of the lung cT3 cN3 M0  Chief complaint/ Reason for visit-on treatment assessment prior to cycle 5 of weekly carbotaxol chemotherapy  Heme/Onc history: Patient is a 69 year old male with a history of tobacco dependence who was admitted to the hospital for symptoms of acute onset shortness of breath and was found to have Covid pneumonia. Martin Jennings underwent CT angio chest which showed extensive mediastinal as well as bilateral hilar adenopathy. Masslike opacity with architectural distortion involving the right upper lobe measuring 5.7 x 2.9 x 2.3 cm and groundglass opacities bilaterally. Mass involving the left adrenal gland as well as a smaller right adrenal gland possibly indicating adrenal metastases.  This was followed by a PET CT scan 2 weeks later which showed the consolidative process in the right upper lobesuv7.88 concerning for infiltrating tumor. It appears to involve the pleura associated interstitial thickening was also seen. Bulky right hilar and mediastinal adenopathy. Enlarged somewhat necrotic appearing left supraclavicular lymph node was also hypermetabolic. Adrenal gland was not hypermetabolic and was consistent with a benign adenoma. No metastatic disease involving liver. No features of distant metastatic disease.  Pathology showed metastatic non-small cell lung cancer which was TTF-1 positive consistent with adenocarcinoma.   Interval history-patient has ongoing fatigue.  Reports scant hemoptysis which has been ongoing since the start of  treatment which has essentially remained stable.  Does report difficulty swallowing and pain associated with it.  ECOG PS- 2 Pain scale- 3 Opioid associated constipation- no  Review of systems- Review of Systems  Constitutional: Positive for malaise/fatigue. Negative for chills, fever and weight loss.  HENT: Negative for congestion, ear discharge and nosebleeds.   Eyes: Negative for blurred vision.  Respiratory: Negative for cough, hemoptysis, sputum production, shortness of breath and wheezing.   Cardiovascular: Negative for chest pain, palpitations, orthopnea and claudication.  Gastrointestinal: Negative for abdominal pain, blood in stool, constipation, diarrhea, heartburn, melena, nausea and vomiting.       Pain during swallowing  Genitourinary: Negative for dysuria, flank pain, frequency, hematuria and urgency.  Musculoskeletal: Negative for back pain, joint pain and myalgias.  Skin: Negative for rash.  Neurological: Negative for dizziness, tingling, focal weakness, seizures, weakness and headaches.  Endo/Heme/Allergies: Does not bruise/bleed easily.  Psychiatric/Behavioral: Negative for depression and suicidal ideas. The patient does not have insomnia.       No Known Allergies   Past Medical History:  Diagnosis Date  . Bladder cancer ([place]) 2012  . COVID-19 virus infection 04/03/2020  . Hypertension   . Lung cancer ([place])   . Peripheral vascular disease [facility])      Past Surgical History:  Procedure Laterality Date  . BLADDER REMOVAL    . LOWER EXTREMITY ANGIOGRAPHY Left 04/12/2017   Procedure: Lower Extremity Angiography;  Surgeon: Katha Cabal, MD;  Location: [place] [facility];  Service: Cardiovascular;  Laterality: Left;  . PORTA CATH INSERTION N/A 05/22/2020   Procedure: PORTA CATH INSERTION;  Surgeon: Algernon Huxley, MD;  Location: [place] [facility];  Service: Cardiovascular;  Laterality: N/A;  . uretostomy    . VASCULAR SURGERY  Social History    Socioeconomic History  . Marital status: Married    Spouse name: Not on file  . Number of children: Not on file  . Years of education: Not on file  . Highest education level: Not on file  Occupational History  . Not on file  Tobacco Use  . Smoking status: Former Smoker    Packs/day: 1.25    Years: 50.00    Pack years: 62.50    Types: Cigarettes    Quit date: 03/13/2020    Years since quitting: 0.2  . Smokeless tobacco: Never Used  Vaping Use  . Vaping Use: Never used  Substance and Sexual Activity  . Alcohol use: No  . Drug use: Not Currently    Types: Marijuana  . Sexual activity: Not Currently  Other Topics Concern  . Not on file  Social History Narrative  . Not on file   Social Determinants of Health   Financial Resource Strain:   . Difficulty of Paying Living Expenses: Not on file  Food Insecurity:   . Worried About Charity fundraiser in the Last Year: Not on file  . Ran Out of Food in the Last Year: Not on file  Transportation Needs:   . Lack of Transportation (Medical): Not on file  . Lack of Transportation (Non-Medical): Not on file  Physical Activity:   . Days of Exercise per Week: Not on file  . Minutes of Exercise per Session: Not on file  Stress:   . Feeling of Stress : Not on file  Social Connections:   . Frequency of Communication with Friends and Family: Not on file  . Frequency of Social Gatherings with Friends and Family: Not on file  . Attends Religious Services: Not on file  . Active Member of Clubs or Organizations: Not on file  . Attends Archivist Meetings: Not on file  . Marital Status: Not on file  Intimate Partner Violence:   . Fear of Current or Ex-Partner: Not on file  . Emotionally Abused: Not on file  . Physically Abused: Not on file  . Sexually Abused: Not on file    No family history on file.   Current Outpatient Medications:  .  amLODipine (NORVASC) 10 MG tablet, Take 10 mg by mouth daily. , Disp: , Rfl:  .   atorvastatin (LIPITOR) 20 MG tablet, Take 20 mg by mouth every evening. , Disp: , Rfl:  .  clopidogrel (PLAVIX) 75 MG tablet, Take 75 mg by mouth daily. , Disp: , Rfl:  .  sucralfate (CARAFATE) 1 g tablet, Take 1 tablet (1 g total) by mouth 3 (three) times daily. Dissolve in 3-4 tbsp warm water, swish and swallow., Disp: 90 tablet, Rfl: 1 .  aspirin EC 81 MG tablet, Take 81 mg by mouth daily.  (Patient not taking: Reported on 05/15/2020), Disp: , Rfl:  .  dexamethasone (DECADRON) 4 MG tablet, Take 2 tablets (8 mg total) by mouth daily. Start the day after chemotherapy for 2 days. (Patient not taking: Reported on 06/10/2020), Disp: 30 tablet, Rfl: 1 .  lidocaine-prilocaine (EMLA) cream, Apply to affected area once, Disp: 30 g, Rfl: 3 .  LORazepam (ATIVAN) 0.5 MG tablet, Take 1 tablet (0.5 mg total) by mouth every 6 (six) hours as needed (Nausea or vomiting). (Patient not taking: Reported on 05/27/2020), Disp: 30 tablet, Rfl: 0 .  ondansetron (ZOFRAN) 8 MG tablet, Take 1 tablet (8 mg total) by mouth 2 (two) times daily as  needed for refractory nausea / vomiting. Start on day 3 after chemo. (Patient not taking: Reported on 05/27/2020), Disp: 30 tablet, Rfl: 1 .  oxyCODONE (ROXICODONE) 5 MG immediate release tablet, Take 1 tablet (5 mg total) by mouth every 8 (eight) hours as needed for severe pain., Disp: 30 tablet, Rfl: 0 .  prochlorperazine (COMPAZINE) 10 MG tablet, Take 1 tablet (10 mg total) by mouth every 6 (six) hours as needed (Nausea or vomiting). (Patient not taking: Reported on 05/27/2020), Disp: 30 tablet, Rfl: 1  Physical exam:  Vitals:   06/24/20 1056  BP: 123/73  Pulse: 89  Resp: 16  Temp: (!) 96.8 F (36 C)  TempSrc: Tympanic  SpO2: 98%  Weight: 171 lb 6.4 oz (77.7 kg)   Physical Exam Constitutional:      General: Martin Jennings is not in acute distress.    Comments: On home oxygen  HENT:     Mouth/Throat:     Mouth: Mucous membranes are moist.     Pharynx: Oropharynx is clear.    Cardiovascular:     Rate and Rhythm: Normal rate and regular rhythm.     Heart sounds: Normal heart sounds.  Pulmonary:     Effort: Pulmonary effort is normal.     Breath sounds: Normal breath sounds.  Abdominal:     General: Bowel sounds are normal.     Palpations: Abdomen is soft.  Skin:    General: Skin is warm and dry.  Neurological:     Mental Status: Martin Jennings is alert and oriented to person, place, and time.      CMP Latest Ref Rng & Units 06/24/2020  Glucose 70 - 99 mg/dL 127(H)  BUN 8 - 23 mg/dL 27(H)  Creatinine 0.61 - 1.24 mg/dL 1.34(H)  Sodium 135 - 145 mmol/L 139  Potassium 3.5 - 5.1 mmol/L 3.8  Chloride 98 - 111 mmol/L 106  CO2 22 - 32 mmol/L 27  Calcium 8.9 - 10.3 mg/dL 7.8(L)  Total Protein 6.5 - 8.1 g/dL 5.9(L)  Total Bilirubin 0.3 - 1.2 mg/dL 0.8  Alkaline Phos 38 - 126 U/L 44  AST 15 - 41 U/L 15  ALT 0 - 44 U/L 11   CBC Latest Ref Rng & Units 06/24/2020  WBC 4.0 - 10.5 K/uL 2.6(L)  Hemoglobin 13.0 - 17.0 g/dL 9.9(L)  Hematocrit 39 - 52 % 29.7(L)  Platelets 150 - 400 K/uL 144(L)      Assessment and plan- Patient is a 69 y.o. male with stage IIIc adenocarcinoma of the right upper lobe of the lung cT4 N3 M0.  Martin Jennings is here for on treatment assessment prior to cycle 5 of weekly carbotaxol chemotherapy  Patient's white cell count is 2.6 with an ANC of 1.8.  Martin Jennings will Received carbotaxol chemotherapy today.  Martin Jennings will proceed for cycle 6 of weekly carbotaxol chemotherapy next week but if his [place] is lower Martin Jennings will likely require growth factor support for 2 to 3 days following chemotherapy.  Mild thrombocytopenia and anemia: Likely secondary to chemotherapy continue to monitor  Radiation esophagitis: Patient has been given Carafate by radiation oncology and I will add a low-dose oxycodone 5 mg every 8 hours as needed  CKD: Essentially stable continue to monitor  I will see him back in 2 weeks time for cycle 7 of carbotaxol chemotherapy   Visit Diagnosis 1. Encounter  for antineoplastic chemotherapy   2. Malignant neoplasm of hilus of right lung ([place])   3. Chemotherapy induced neutropenia ([facility])   4.  Radiation esophagitis      Dr. Randa Evens, MD, MPH [facility] [phone] 06/26/2020 8:12 AM

## 2020-06-27 ENCOUNTER — Ambulatory Visit
Admission: RE | Admit: 2020-06-27 | Discharge: 2020-06-27 | Disposition: A | Discharge status: Home / Self Care | Payer: Medicare Other | Source: Ambulatory Visit | Attending: Radiation Oncology | Admitting: Radiation Oncology

## 2020-06-27 DIAGNOSIS — Z510 Encounter for antineoplastic radiation therapy: Secondary | ICD-10-CM | POA: Diagnosis not present

## 2020-06-30 ENCOUNTER — Ambulatory Visit
Admission: RE | Admit: 2020-06-30 | Discharge: 2020-06-30 | Disposition: A | Discharge status: Home / Self Care | Payer: Medicare Other | Source: Ambulatory Visit | Attending: Radiation Oncology | Admitting: Radiation Oncology

## 2020-06-30 ENCOUNTER — Telehealth: Payer: Self-pay | Admitting: Pulmonary Disease

## 2020-06-30 DIAGNOSIS — Z510 Encounter for antineoplastic radiation therapy: Secondary | ICD-10-CM | POA: Diagnosis not present

## 2020-06-30 NOTE — Telephone Encounter (Signed)
ATC patient--line rang for >46min with no option to leave vm.  Will call back.

## 2020-07-01 ENCOUNTER — Ambulatory Visit
Admission: RE | Admit: 2020-07-01 | Discharge: 2020-07-01 | Disposition: A | Discharge status: Home / Self Care | Payer: Medicare Other | Source: Ambulatory Visit | Attending: Radiation Oncology | Admitting: Radiation Oncology

## 2020-07-01 DIAGNOSIS — Z510 Encounter for antineoplastic radiation therapy: Secondary | ICD-10-CM | POA: Diagnosis not present

## 2020-07-01 NOTE — Telephone Encounter (Signed)
Spoke to patient, who stated that he needs replacement oxygen tanks, as his current tanks are empty.   Patient has been provided with [employer]'s contact number. Patient will contact [employer] for tanks.  Nothing further needed.

## 2020-07-02 ENCOUNTER — Ambulatory Visit
Admission: RE | Admit: 2020-07-02 | Discharge: 2020-07-02 | Disposition: A | Discharge status: Home / Self Care | Payer: Medicare Other | Source: Ambulatory Visit | Attending: Radiation Oncology | Admitting: Radiation Oncology

## 2020-07-02 DIAGNOSIS — Z510 Encounter for antineoplastic radiation therapy: Secondary | ICD-10-CM | POA: Diagnosis not present

## 2020-07-03 ENCOUNTER — Inpatient Hospital Stay: Payer: Medicare Other | Attending: Oncology

## 2020-07-03 ENCOUNTER — Other Ambulatory Visit: Payer: Self-pay

## 2020-07-03 ENCOUNTER — Inpatient Hospital Stay: Payer: Medicare Other

## 2020-07-03 ENCOUNTER — Ambulatory Visit
Admission: RE | Admit: 2020-07-03 | Discharge: 2020-07-03 | Disposition: A | Discharge status: Home / Self Care | Payer: Medicare Other | Source: Ambulatory Visit | Attending: Radiation Oncology | Admitting: Radiation Oncology

## 2020-07-03 VITALS — BP 120/71 | HR 95 | Temp 97.9°F | Resp 16 | Wt 168.2 lb

## 2020-07-03 DIAGNOSIS — D701 Agranulocytosis secondary to cancer chemotherapy: Secondary | ICD-10-CM | POA: Insufficient documentation

## 2020-07-03 DIAGNOSIS — I10 Essential (primary) hypertension: Secondary | ICD-10-CM | POA: Diagnosis not present

## 2020-07-03 DIAGNOSIS — Z79899 Other long term (current) drug therapy: Secondary | ICD-10-CM | POA: Insufficient documentation

## 2020-07-03 DIAGNOSIS — I739 Peripheral vascular disease, unspecified: Secondary | ICD-10-CM | POA: Diagnosis not present

## 2020-07-03 DIAGNOSIS — Z8616 Personal history of COVID-19: Secondary | ICD-10-CM | POA: Insufficient documentation

## 2020-07-03 DIAGNOSIS — Z87891 Personal history of nicotine dependence: Secondary | ICD-10-CM | POA: Insufficient documentation

## 2020-07-03 DIAGNOSIS — N189 Chronic kidney disease, unspecified: Secondary | ICD-10-CM | POA: Diagnosis not present

## 2020-07-03 DIAGNOSIS — Z5111 Encounter for antineoplastic chemotherapy: Secondary | ICD-10-CM | POA: Insufficient documentation

## 2020-07-03 DIAGNOSIS — T451X5A Adverse effect of antineoplastic and immunosuppressive drugs, initial encounter: Secondary | ICD-10-CM | POA: Insufficient documentation

## 2020-07-03 DIAGNOSIS — C3411 Malignant neoplasm of upper lobe, right bronchus or lung: Secondary | ICD-10-CM | POA: Diagnosis present

## 2020-07-03 DIAGNOSIS — Z5189 Encounter for other specified aftercare: Secondary | ICD-10-CM | POA: Diagnosis not present

## 2020-07-03 DIAGNOSIS — C3401 Malignant neoplasm of right main bronchus: Secondary | ICD-10-CM

## 2020-07-03 DIAGNOSIS — Z8551 Personal history of malignant neoplasm of bladder: Secondary | ICD-10-CM | POA: Diagnosis not present

## 2020-07-03 DIAGNOSIS — Z510 Encounter for antineoplastic radiation therapy: Secondary | ICD-10-CM | POA: Diagnosis not present

## 2020-07-03 LAB — CBC WITH DIFFERENTIAL/PLATELET
Abs Immature Granulocytes: 0.02 10*3/uL (ref 0.00–0.07)
Basophils Absolute: 0 10*3/uL (ref 0.0–0.1)
Basophils Relative: 0 %
Eosinophils Absolute: 0.1 10*3/uL (ref 0.0–0.5)
Eosinophils Relative: 2 %
HCT: 29.5 % — ABNORMAL LOW (ref 39.0–52.0)
Hemoglobin: 9.7 g/dL — ABNORMAL LOW (ref 13.0–17.0)
Immature Granulocytes: 1 %
Lymphocytes Relative: 17 %
Lymphs Abs: 0.5 10*3/uL — ABNORMAL LOW (ref 0.7–4.0)
MCH: 32 pg (ref 26.0–34.0)
MCHC: 32.9 g/dL (ref 30.0–36.0)
MCV: 97.4 fL (ref 80.0–100.0)
Monocytes Absolute: 0.3 10*3/uL (ref 0.1–1.0)
Monocytes Relative: 9 %
Neutro Abs: 2.2 10*3/uL (ref 1.7–7.7)
Neutrophils Relative %: 71 %
Platelets: 163 10*3/uL (ref 150–400)
RBC: 3.03 MIL/uL — ABNORMAL LOW (ref 4.22–5.81)
RDW: 16 % — ABNORMAL HIGH (ref 11.5–15.5)
WBC: 3.1 10*3/uL — ABNORMAL LOW (ref 4.0–10.5)
nRBC: 0 % (ref 0.0–0.2)

## 2020-07-03 LAB — COMPREHENSIVE METABOLIC PANEL
ALT: 10 U/L (ref 0–44)
AST: 12 U/L — ABNORMAL LOW (ref 15–41)
Albumin: 2.9 g/dL — ABNORMAL LOW (ref 3.5–5.0)
Alkaline Phosphatase: 38 U/L (ref 38–126)
Anion gap: 11 (ref 5–15)
BUN: 21 mg/dL (ref 8–23)
CO2: 23 mmol/L (ref 22–32)
Calcium: 8.2 mg/dL — ABNORMAL LOW (ref 8.9–10.3)
Chloride: 105 mmol/L (ref 98–111)
Creatinine, Ser: 1.65 mg/dL — ABNORMAL HIGH (ref 0.61–1.24)
GFR, Estimated: 45 mL/min — ABNORMAL LOW (ref >60)
Glucose, Bld: 157 mg/dL — ABNORMAL HIGH (ref 70–99)
Potassium: 3.6 mmol/L (ref 3.5–5.1)
Sodium: 139 mmol/L (ref 135–145)
Total Bilirubin: 0.8 mg/dL (ref 0.3–1.2)
Total Protein: 5.9 g/dL — ABNORMAL LOW (ref 6.5–8.1)

## 2020-07-03 MED ORDER — DIPHENHYDRAMINE HCL 50 MG/ML IJ SOLN
50.0000 mg | Freq: Once | INTRAMUSCULAR | Status: AC
  Administered 2020-07-03: 50 mg via INTRAVENOUS
  Filled 2020-07-03: qty 1

## 2020-07-03 MED ORDER — SODIUM CHLORIDE 0.9 % IV SOLN
45.0000 mg/m2 | Freq: Once | INTRAVENOUS | Status: AC
  Administered 2020-07-03: 90 mg via INTRAVENOUS
  Filled 2020-07-03: qty 15

## 2020-07-03 MED ORDER — SODIUM CHLORIDE 0.9 % IV SOLN
106.8000 mg | Freq: Once | INTRAVENOUS | Status: AC
  Administered 2020-07-03: 110 mg via INTRAVENOUS
  Filled 2020-07-03: qty 11

## 2020-07-03 MED ORDER — SODIUM CHLORIDE 0.9% FLUSH
10.0000 mL | INTRAVENOUS | Status: DC | PRN
  Administered 2020-07-03 (×2): 10 mL
  Filled 2020-07-03: qty 10

## 2020-07-03 MED ORDER — SODIUM CHLORIDE 0.9% FLUSH
10.0000 mL | Freq: Once | INTRAVENOUS | Status: AC
  Administered 2020-07-03: 10 mL via INTRAVENOUS
  Filled 2020-07-03: qty 10

## 2020-07-03 MED ORDER — HEPARIN SOD (PORK) LOCK FLUSH 100 UNIT/ML IV SOLN
INTRAVENOUS | Status: AC
  Filled 2020-07-03: qty 5

## 2020-07-03 MED ORDER — HEPARIN SOD (PORK) LOCK FLUSH 100 UNIT/ML IV SOLN
500.0000 [IU] | Freq: Once | INTRAVENOUS | Status: DC | PRN
  Filled 2020-07-03: qty 5

## 2020-07-03 MED ORDER — SODIUM CHLORIDE 0.9 % IV SOLN
Freq: Once | INTRAVENOUS | Status: AC
  Filled 2020-07-03: qty 250

## 2020-07-03 MED ORDER — SODIUM CHLORIDE 0.9 % IV SOLN
20.0000 mg | Freq: Once | INTRAVENOUS | Status: AC
  Administered 2020-07-03: 20 mg via INTRAVENOUS
  Filled 2020-07-03: qty 20

## 2020-07-03 MED ORDER — PALONOSETRON HCL INJECTION 0.25 MG/5ML
0.2500 mg | Freq: Once | INTRAVENOUS | Status: AC
  Administered 2020-07-03: 0.25 mg via INTRAVENOUS
  Filled 2020-07-03: qty 5

## 2020-07-03 MED ORDER — FAMOTIDINE IN NACL 20-0.9 MG/50ML-% IV SOLN
20.0000 mg | Freq: Once | INTRAVENOUS | Status: AC
  Administered 2020-07-03: 20 mg via INTRAVENOUS
  Filled 2020-07-03: qty 50

## 2020-07-03 MED ORDER — HEPARIN SOD (PORK) LOCK FLUSH 100 UNIT/ML IV SOLN
500.0000 [IU] | Freq: Once | INTRAVENOUS | Status: AC
  Administered 2020-07-03: 500 [IU] via INTRAVENOUS
  Filled 2020-07-03: qty 5

## 2020-07-03 NOTE — Progress Notes (Signed)
Per Dr. Janese Banks, [place] to treat with Creatinine 1.65 today. Infusion well tolerated. Discharged home in stable condition.

## 2020-07-04 ENCOUNTER — Ambulatory Visit
Admission: RE | Admit: 2020-07-04 | Discharge: 2020-07-04 | Disposition: A | Discharge status: Home / Self Care | Payer: Medicare Other | Source: Ambulatory Visit | Attending: Radiation Oncology | Admitting: Radiation Oncology

## 2020-07-04 DIAGNOSIS — Z510 Encounter for antineoplastic radiation therapy: Secondary | ICD-10-CM | POA: Diagnosis not present

## 2020-07-07 ENCOUNTER — Other Ambulatory Visit: Payer: Self-pay | Admitting: *Deleted

## 2020-07-07 ENCOUNTER — Ambulatory Visit
Admission: RE | Admit: 2020-07-07 | Discharge: 2020-07-07 | Disposition: A | Discharge status: Home / Self Care | Payer: Medicare Other | Source: Ambulatory Visit | Attending: Radiation Oncology | Admitting: Radiation Oncology

## 2020-07-07 DIAGNOSIS — Z510 Encounter for antineoplastic radiation therapy: Secondary | ICD-10-CM | POA: Diagnosis not present

## 2020-07-07 MED ORDER — OXYCODONE HCL 5 MG PO TABS: 5.0000 mg | ORAL_TABLET | Freq: Three times a day (TID) | ORAL | 0 refills | Status: DC | PRN

## 2020-07-08 ENCOUNTER — Ambulatory Visit
Admission: RE | Admit: 2020-07-08 | Discharge: 2020-07-08 | Disposition: A | Discharge status: Home / Self Care | Payer: Medicare Other | Source: Ambulatory Visit | Attending: Radiation Oncology | Admitting: Radiation Oncology

## 2020-07-08 DIAGNOSIS — Z510 Encounter for antineoplastic radiation therapy: Secondary | ICD-10-CM | POA: Diagnosis not present

## 2020-07-09 ENCOUNTER — Ambulatory Visit
Admission: RE | Admit: 2020-07-09 | Discharge: 2020-07-09 | Disposition: A | Discharge status: Home / Self Care | Payer: Medicare Other | Source: Ambulatory Visit | Attending: Radiation Oncology | Admitting: Radiation Oncology

## 2020-07-09 DIAGNOSIS — Z510 Encounter for antineoplastic radiation therapy: Secondary | ICD-10-CM | POA: Diagnosis not present

## 2020-07-10 ENCOUNTER — Inpatient Hospital Stay (HOSPITAL_BASED_OUTPATIENT_CLINIC_OR_DEPARTMENT_OTHER): Payer: Medicare Other | Admitting: Oncology

## 2020-07-10 ENCOUNTER — Inpatient Hospital Stay: Payer: Medicare Other

## 2020-07-10 ENCOUNTER — Other Ambulatory Visit: Payer: Self-pay | Admitting: *Deleted

## 2020-07-10 ENCOUNTER — Other Ambulatory Visit: Payer: Self-pay

## 2020-07-10 ENCOUNTER — Encounter: Payer: Self-pay | Admitting: Oncology

## 2020-07-10 ENCOUNTER — Ambulatory Visit
Admission: RE | Admit: 2020-07-10 | Discharge: 2020-07-10 | Disposition: A | Discharge status: Home / Self Care | Payer: Medicare Other | Source: Ambulatory Visit | Attending: Radiation Oncology | Admitting: Radiation Oncology

## 2020-07-10 VITALS — BP 119/67 | HR 95 | Temp 98.3°F | Resp 18 | Wt 169.9 lb

## 2020-07-10 VITALS — BP 123/67 | HR 81 | Resp 20

## 2020-07-10 DIAGNOSIS — C3411 Malignant neoplasm of upper lobe, right bronchus or lung: Secondary | ICD-10-CM

## 2020-07-10 DIAGNOSIS — Z5111 Encounter for antineoplastic chemotherapy: Secondary | ICD-10-CM

## 2020-07-10 DIAGNOSIS — T451X5A Adverse effect of antineoplastic and immunosuppressive drugs, initial encounter: Secondary | ICD-10-CM

## 2020-07-10 DIAGNOSIS — D701 Agranulocytosis secondary to cancer chemotherapy: Secondary | ICD-10-CM

## 2020-07-10 DIAGNOSIS — C3401 Malignant neoplasm of right main bronchus: Secondary | ICD-10-CM

## 2020-07-10 DIAGNOSIS — Z510 Encounter for antineoplastic radiation therapy: Secondary | ICD-10-CM | POA: Diagnosis not present

## 2020-07-10 LAB — CBC WITH DIFFERENTIAL/PLATELET
Abs Immature Granulocytes: 0.01 10*3/uL (ref 0.00–0.07)
Basophils Absolute: 0 10*3/uL (ref 0.0–0.1)
Basophils Relative: 1 %
Eosinophils Absolute: 0.1 10*3/uL (ref 0.0–0.5)
Eosinophils Relative: 2 %
HCT: 27.7 % — ABNORMAL LOW (ref 39.0–52.0)
Hemoglobin: 9.3 g/dL — ABNORMAL LOW (ref 13.0–17.0)
Immature Granulocytes: 1 %
Lymphocytes Relative: 22 %
Lymphs Abs: 0.5 10*3/uL — ABNORMAL LOW (ref 0.7–4.0)
MCH: 32.5 pg (ref 26.0–34.0)
MCHC: 33.6 g/dL (ref 30.0–36.0)
MCV: 96.9 fL (ref 80.0–100.0)
Monocytes Absolute: 0.3 10*3/uL (ref 0.1–1.0)
Monocytes Relative: 14 %
Neutro Abs: 1.4 10*3/uL — ABNORMAL LOW (ref 1.7–7.7)
Neutrophils Relative %: 60 %
Platelets: 167 10*3/uL (ref 150–400)
RBC: 2.86 MIL/uL — ABNORMAL LOW (ref 4.22–5.81)
RDW: 15.7 % — ABNORMAL HIGH (ref 11.5–15.5)
WBC: 2.2 10*3/uL — ABNORMAL LOW (ref 4.0–10.5)
nRBC: 0 % (ref 0.0–0.2)

## 2020-07-10 LAB — COMPREHENSIVE METABOLIC PANEL
ALT: 10 U/L (ref 0–44)
AST: 14 U/L — ABNORMAL LOW (ref 15–41)
Albumin: 3 g/dL — ABNORMAL LOW (ref 3.5–5.0)
Alkaline Phosphatase: 42 U/L (ref 38–126)
Anion gap: 8 (ref 5–15)
BUN: 20 mg/dL (ref 8–23)
CO2: 23 mmol/L (ref 22–32)
Calcium: 8.2 mg/dL — ABNORMAL LOW (ref 8.9–10.3)
Chloride: 106 mmol/L (ref 98–111)
Creatinine, Ser: 1.53 mg/dL — ABNORMAL HIGH (ref 0.61–1.24)
GFR, Estimated: 49 mL/min — ABNORMAL LOW (ref >60)
Glucose, Bld: 98 mg/dL (ref 70–99)
Potassium: 3.4 mmol/L — ABNORMAL LOW (ref 3.5–5.1)
Sodium: 137 mmol/L (ref 135–145)
Total Bilirubin: 0.6 mg/dL (ref 0.3–1.2)
Total Protein: 5.9 g/dL — ABNORMAL LOW (ref 6.5–8.1)

## 2020-07-10 MED ORDER — PALONOSETRON HCL INJECTION 0.25 MG/5ML
0.2500 mg | Freq: Once | INTRAVENOUS | Status: AC
  Administered 2020-07-10: 0.25 mg via INTRAVENOUS
  Filled 2020-07-10: qty 5

## 2020-07-10 MED ORDER — SODIUM CHLORIDE 0.9% FLUSH
10.0000 mL | Freq: Once | INTRAVENOUS | Status: AC
  Administered 2020-07-10: 10 mL via INTRAVENOUS
  Filled 2020-07-10: qty 10

## 2020-07-10 MED ORDER — SODIUM CHLORIDE 0.9 % IV SOLN
20.0000 mg | Freq: Once | INTRAVENOUS | Status: AC
  Administered 2020-07-10: 20 mg via INTRAVENOUS
  Filled 2020-07-10: qty 20

## 2020-07-10 MED ORDER — DIPHENHYDRAMINE HCL 50 MG/ML IJ SOLN
50.0000 mg | Freq: Once | INTRAMUSCULAR | Status: AC
  Administered 2020-07-10: 50 mg via INTRAVENOUS
  Filled 2020-07-10: qty 1

## 2020-07-10 MED ORDER — SODIUM CHLORIDE 0.9 % IV SOLN
45.0000 mg/m2 | Freq: Once | INTRAVENOUS | Status: AC
  Administered 2020-07-10: 90 mg via INTRAVENOUS
  Filled 2020-07-10: qty 15

## 2020-07-10 MED ORDER — HEPARIN SOD (PORK) LOCK FLUSH 100 UNIT/ML IV SOLN
500.0000 [IU] | Freq: Once | INTRAVENOUS | Status: AC | PRN
  Administered 2020-07-10: 500 [IU]
  Filled 2020-07-10: qty 5

## 2020-07-10 MED ORDER — SODIUM CHLORIDE 0.9 % IV SOLN
Freq: Once | INTRAVENOUS | Status: AC
  Filled 2020-07-10: qty 250

## 2020-07-10 MED ORDER — FAMOTIDINE IN NACL 20-0.9 MG/50ML-% IV SOLN
20.0000 mg | Freq: Once | INTRAVENOUS | Status: AC
  Administered 2020-07-10: 20 mg via INTRAVENOUS
  Filled 2020-07-10: qty 50

## 2020-07-10 MED ORDER — SODIUM CHLORIDE 0.9 % IV SOLN
112.2000 mg | Freq: Once | INTRAVENOUS | Status: AC
  Administered 2020-07-10: 110 mg via INTRAVENOUS
  Filled 2020-07-10: qty 11

## 2020-07-10 MED ORDER — OXYCODONE HCL 5 MG/5ML PO SOLN: 5.0000 mg | Freq: Four times a day (QID) | ORAL | 0 refills | Status: DC | PRN

## 2020-07-10 MED ORDER — HEPARIN SOD (PORK) LOCK FLUSH 100 UNIT/ML IV SOLN
INTRAVENOUS | Status: AC
  Filled 2020-07-10: qty 5

## 2020-07-10 NOTE — Progress Notes (Signed)
1232- ANC: 1400, Creatinine: 1.53, Potassium: 3.4. MD, Dr. Janese Banks, notified and aware. Per MD order: proceed with scheduled Taxol and Carboplatin treatment today; no new orders.  1534- Patient tolerated treatment well. Patient stable and discharged to home at this time.

## 2020-07-11 ENCOUNTER — Ambulatory Visit
Admission: RE | Admit: 2020-07-11 | Discharge: 2020-07-11 | Disposition: A | Discharge status: Home / Self Care | Payer: Medicare Other | Source: Ambulatory Visit | Attending: Radiation Oncology | Admitting: Radiation Oncology

## 2020-07-11 ENCOUNTER — Inpatient Hospital Stay: Payer: Medicare Other

## 2020-07-11 ENCOUNTER — Other Ambulatory Visit: Payer: Self-pay

## 2020-07-11 DIAGNOSIS — Z5111 Encounter for antineoplastic chemotherapy: Secondary | ICD-10-CM | POA: Diagnosis not present

## 2020-07-11 DIAGNOSIS — D701 Agranulocytosis secondary to cancer chemotherapy: Secondary | ICD-10-CM

## 2020-07-11 DIAGNOSIS — T451X5A Adverse effect of antineoplastic and immunosuppressive drugs, initial encounter: Secondary | ICD-10-CM

## 2020-07-11 DIAGNOSIS — Z510 Encounter for antineoplastic radiation therapy: Secondary | ICD-10-CM | POA: Diagnosis not present

## 2020-07-11 MED ORDER — FILGRASTIM-SNDZ 480 MCG/0.8ML IJ SOSY
480.0000 ug | PREFILLED_SYRINGE | Freq: Every day | INTRAMUSCULAR | Status: DC
  Administered 2020-07-11: 480 ug via SUBCUTANEOUS
  Filled 2020-07-11: qty 0.8

## 2020-07-11 NOTE — Progress Notes (Signed)
Hematology/Oncology Consult note [facility]  Telephone:([phone] [phone]  Patient Care Team: Perrin Maltese, MD as PCP - General (Internal Medicine) Telford Nab, RN as Oncology Nurse Navigator Sindy Guadeloupe, MD as Consulting Physician (Hematology and Oncology)   Name of the patient: Martin Jennings  [id number]  [birth year]   Date of visit: 07/11/20  Diagnosis- stage IIIc adenocarcinoma of the lung cT3 cN3 M0  Chief complaint/ Reason for visit-on treatment assessment prior to cycle 7 of weekly carbotaxol chemotherapy  Heme/Onc history: Patient is a 69 year old male with a history of tobacco dependence who was admitted to the hospital for symptoms of acute onset shortness of breath and was found to have Covid pneumonia. He underwent CT angio chest which showed extensive mediastinal as well as bilateral hilar adenopathy. Masslike opacity with architectural distortion involving the right upper lobe measuring 5.7 x 2.9 x 2.3 cm and groundglass opacities bilaterally. Mass involving the left adrenal gland as well as a smaller right adrenal gland possibly indicating adrenal metastases.  This was followed by a PET CT scan 2 weeks later which showed the consolidative process in the right upper lobesuv7.88 concerning for infiltrating tumor. It appears to involve the pleura associated interstitial thickening was also seen. Bulky right hilar and mediastinal adenopathy. Enlarged somewhat necrotic appearing left supraclavicular lymph node was also hypermetabolic. Adrenal gland was not hypermetabolic and was consistent with a benign adenoma. No metastatic disease involving liver. No features of distant metastatic disease.  Pathology showed metastatic non-small cell lung cancer which was TTF-1 positive consistent with adenocarcinoma.   Interval history-reports that his hemoptysis has resolved. He reports some ongoing cough which is mainly dry  without Any sputum production. Denies any fever.  ECOG PS- 2 Pain scale- 0 Opioid associated constipation- no  Review of systems- Review of Systems  Constitutional: Negative for chills, fever, malaise/fatigue and weight loss.  HENT: Negative for congestion, ear discharge and nosebleeds.   Eyes: Negative for blurred vision.  Respiratory: Positive for cough. Negative for hemoptysis, sputum production, shortness of breath and wheezing.   Cardiovascular: Negative for chest pain, palpitations, orthopnea and claudication.  Gastrointestinal: Negative for abdominal pain, blood in stool, constipation, diarrhea, heartburn, melena, nausea and vomiting.  Genitourinary: Negative for dysuria, flank pain, frequency, hematuria and urgency.  Musculoskeletal: Negative for back pain, joint pain and myalgias.  Skin: Negative for rash.  Neurological: Negative for dizziness, tingling, focal weakness, seizures, weakness and headaches.  Endo/Heme/Allergies: Does not bruise/bleed easily.  Psychiatric/Behavioral: Negative for depression and suicidal ideas. The patient does not have insomnia.        No Known Allergies   Past Medical History:  Diagnosis Date  . Bladder cancer ([place]) 2012  . COVID-19 virus infection 04/03/2020  . Hypertension   . Lung cancer ([place])   . Peripheral vascular disease [facility])      Past Surgical History:  Procedure Laterality Date  . BLADDER REMOVAL    . LOWER EXTREMITY ANGIOGRAPHY Left 04/12/2017   Procedure: Lower Extremity Angiography;  Surgeon: Katha Cabal, MD;  Location: [place] [facility];  Service: Cardiovascular;  Laterality: Left;  . PORTA CATH INSERTION N/A 05/22/2020   Procedure: PORTA CATH INSERTION;  Surgeon: Algernon Huxley, MD;  Location: [place] [facility];  Service: Cardiovascular;  Laterality: N/A;  . uretostomy    . VASCULAR SURGERY      Social History   Socioeconomic History  . Marital status: Married    Spouse name: Not  on file  . Number of  children: Not on file  . Years of education: Not on file  . Highest education level: Not on file  Occupational History  . Not on file  Tobacco Use  . Smoking status: Former Smoker    Packs/day: 1.25    Years: 50.00    Pack years: 62.50    Types: Cigarettes    Quit date: 03/13/2020    Years since quitting: 0.3  . Smokeless tobacco: Never Used  Vaping Use  . Vaping Use: Never used  Substance and Sexual Activity  . Alcohol use: No  . Drug use: Not Currently    Types: Marijuana  . Sexual activity: Not Currently  Other Topics Concern  . Not on file  Social History Narrative  . Not on file   Social Determinants of Health   Financial Resource Strain: Not on file  Food Insecurity: Not on file  Transportation Needs: Not on file  Physical Activity: Not on file  Stress: Not on file  Social Connections: Not on file  Intimate Partner Violence: Not on file    No family history on file.   Current Outpatient Medications:  .  amLODipine (NORVASC) 10 MG tablet, Take 10 mg by mouth daily. , Disp: , Rfl:  .  atorvastatin (LIPITOR) 20 MG tablet, Take 20 mg by mouth every evening. , Disp: , Rfl:  .  clopidogrel (PLAVIX) 75 MG tablet, Take 75 mg by mouth daily. , Disp: , Rfl:  .  lidocaine-prilocaine (EMLA) cream, Apply to affected area once, Disp: 30 g, Rfl: 3 .  oxyCODONE (ROXICODONE) 5 MG immediate release tablet, Take 1 tablet (5 mg total) by mouth every 8 (eight) hours as needed for severe pain., Disp: 30 tablet, Rfl: 0 .  sucralfate (CARAFATE) 1 g tablet, Take 1 tablet (1 g total) by mouth 3 (three) times daily. Dissolve in 3-4 tbsp warm water, swish and swallow., Disp: 90 tablet, Rfl: 1 .  aspirin EC 81 MG tablet, Take 81 mg by mouth daily.  (Patient not taking: Reported on 07/10/2020), Disp: , Rfl:  .  dexamethasone (DECADRON) 4 MG tablet, Take 2 tablets (8 mg total) by mouth daily. Start the day after chemotherapy for 2 days. (Patient not taking: No sig reported), Disp: 30 tablet,  Rfl: 1 .  LORazepam (ATIVAN) 0.5 MG tablet, Take 1 tablet (0.5 mg total) by mouth every 6 (six) hours as needed (Nausea or vomiting). (Patient not taking: No sig reported), Disp: 30 tablet, Rfl: 0 .  ondansetron (ZOFRAN) 8 MG tablet, Take 1 tablet (8 mg total) by mouth 2 (two) times daily as needed for refractory nausea / vomiting. Start on day 3 after chemo. (Patient not taking: No sig reported), Disp: 30 tablet, Rfl: 1 .  oxyCODONE (ROXICODONE) 5 MG/5ML solution, Take 5 mLs (5 mg total) by mouth every 6 (six) hours as needed for severe pain., Disp: 120 mL, Rfl: 0 .  prochlorperazine (COMPAZINE) 10 MG tablet, Take 1 tablet (10 mg total) by mouth every 6 (six) hours as needed (Nausea or vomiting). (Patient not taking: No sig reported), Disp: 30 tablet, Rfl: 1  Physical exam:  Vitals:   07/10/20 1132  BP: 119/67  Pulse: 95  Resp: 18  Temp: 98.3 F (36.8 C)  TempSrc: Tympanic  SpO2: 100%  Weight: 169 lb 14.4 oz (77.1 kg)   Physical Exam Constitutional:      General: He is not in acute distress.    Comments: On home oxygen  Eyes:     Extraocular Movements: EOM normal.  Cardiovascular:     Rate and Rhythm: Normal rate and regular rhythm.     Heart sounds: Normal heart sounds.  Pulmonary:     Effort: Pulmonary effort is normal.     Breath sounds: Normal breath sounds.  Abdominal:     General: Bowel sounds are normal.     Palpations: Abdomen is soft.  Skin:    General: Skin is warm and dry.  Neurological:     Mental Status: He is alert and oriented to person, place, and time.      CMP Latest Ref Rng & Units 07/10/2020  Glucose 70 - 99 mg/dL 98  BUN 8 - 23 mg/dL 20  Creatinine 0.61 - 1.24 mg/dL 1.53(H)  Sodium 135 - 145 mmol/L 137  Potassium 3.5 - 5.1 mmol/L 3.4(L)  Chloride 98 - 111 mmol/L 106  CO2 22 - 32 mmol/L 23  Calcium 8.9 - 10.3 mg/dL 8.2(L)  Total Protein 6.5 - 8.1 g/dL 5.9(L)  Total Bilirubin 0.3 - 1.2 mg/dL 0.6  Alkaline Phos 38 - 126 U/L 42  AST 15 - 41 U/L  14(L)  ALT 0 - 44 U/L 10   CBC Latest Ref Rng & Units 07/10/2020  WBC 4.0 - 10.5 K/uL 2.2(L)  Hemoglobin 13.0 - 17.0 g/dL 9.3(L)  Hematocrit 39.0 - 52.0 % 27.7(L)  Platelets 150 - 400 K/uL 167     Assessment and plan- Patient is a 69 y.o. male with stage IIIc adenocarcinoma of the right upper lobe of the lung cT4 N3 M0.  He is here for on treatment assessment prior to cycle 7 of weekly carbotaxol chemotherapy  White cell count today is 2.2 with an ANC of 1.4. He will proceed with cycle 7 of weekly carbotaxol chemotherapy which will be his last dose. He will come back for Zarxio on 1217, 12/20 and 07/15/2020. Patient completes radiation treatment on 07/21/2020.  Repeat CT chest abdomen pelvis with contrast after 07/21/2020. I will tentatively see him in 3 weeks time with CBC with differential and CMP to start first cycle of durvalumab if he has partial response/stable disease on CT scans  Chemo-induced anemia: Stable continue to monitor he also has an element of anemia of CKD   Visit Diagnosis 1. Malignant neoplasm of upper lobe of right lung ([place])   2. Encounter for antineoplastic chemotherapy      Dr. Randa Evens, MD, MPH [facility] at [facility] [phone] 07/11/2020 11:00 AM

## 2020-07-14 ENCOUNTER — Inpatient Hospital Stay: Payer: Medicare Other

## 2020-07-14 ENCOUNTER — Ambulatory Visit
Admission: RE | Admit: 2020-07-14 | Discharge: 2020-07-14 | Disposition: A | Discharge status: Home / Self Care | Payer: Medicare Other | Source: Ambulatory Visit | Attending: Radiation Oncology | Admitting: Radiation Oncology

## 2020-07-14 DIAGNOSIS — Z510 Encounter for antineoplastic radiation therapy: Secondary | ICD-10-CM | POA: Diagnosis not present

## 2020-07-14 DIAGNOSIS — D701 Agranulocytosis secondary to cancer chemotherapy: Secondary | ICD-10-CM

## 2020-07-14 DIAGNOSIS — Z5111 Encounter for antineoplastic chemotherapy: Secondary | ICD-10-CM | POA: Diagnosis not present

## 2020-07-14 DIAGNOSIS — T451X5A Adverse effect of antineoplastic and immunosuppressive drugs, initial encounter: Secondary | ICD-10-CM

## 2020-07-14 MED ORDER — FILGRASTIM-SNDZ 480 MCG/0.8ML IJ SOSY
480.0000 ug | PREFILLED_SYRINGE | Freq: Every day | INTRAMUSCULAR | Status: DC
  Administered 2020-07-14: 480 ug via SUBCUTANEOUS
  Filled 2020-07-14: qty 0.8

## 2020-07-15 ENCOUNTER — Ambulatory Visit
Admission: RE | Admit: 2020-07-15 | Discharge: 2020-07-15 | Disposition: A | Discharge status: Home / Self Care | Payer: Medicare Other | Source: Ambulatory Visit | Attending: Radiation Oncology | Admitting: Radiation Oncology

## 2020-07-15 ENCOUNTER — Other Ambulatory Visit: Payer: Self-pay

## 2020-07-15 ENCOUNTER — Inpatient Hospital Stay: Payer: Medicare Other

## 2020-07-15 DIAGNOSIS — T451X5A Adverse effect of antineoplastic and immunosuppressive drugs, initial encounter: Secondary | ICD-10-CM

## 2020-07-15 DIAGNOSIS — Z5111 Encounter for antineoplastic chemotherapy: Secondary | ICD-10-CM | POA: Diagnosis not present

## 2020-07-15 DIAGNOSIS — Z510 Encounter for antineoplastic radiation therapy: Secondary | ICD-10-CM | POA: Diagnosis not present

## 2020-07-15 MED ORDER — FILGRASTIM-SNDZ 480 MCG/0.8ML IJ SOSY
480.0000 ug | PREFILLED_SYRINGE | Freq: Every day | INTRAMUSCULAR | Status: DC
  Administered 2020-07-15: 480 ug via SUBCUTANEOUS
  Filled 2020-07-15: qty 0.8

## 2020-07-16 ENCOUNTER — Ambulatory Visit
Admission: RE | Admit: 2020-07-16 | Discharge: 2020-07-16 | Disposition: A | Discharge status: Home / Self Care | Payer: Medicare Other | Source: Ambulatory Visit | Attending: Radiation Oncology | Admitting: Radiation Oncology

## 2020-07-16 DIAGNOSIS — Z510 Encounter for antineoplastic radiation therapy: Secondary | ICD-10-CM | POA: Diagnosis not present

## 2020-07-17 ENCOUNTER — Ambulatory Visit
Admission: RE | Admit: 2020-07-17 | Discharge: 2020-07-17 | Disposition: A | Discharge status: Home / Self Care | Payer: Medicare Other | Source: Ambulatory Visit | Attending: Radiation Oncology | Admitting: Radiation Oncology

## 2020-07-17 DIAGNOSIS — Z510 Encounter for antineoplastic radiation therapy: Secondary | ICD-10-CM | POA: Diagnosis not present

## 2020-07-21 ENCOUNTER — Ambulatory Visit
Admission: RE | Admit: 2020-07-21 | Discharge: 2020-07-21 | Disposition: A | Discharge status: Home / Self Care | Payer: Medicare Other | Source: Ambulatory Visit | Attending: Radiation Oncology | Admitting: Radiation Oncology

## 2020-07-21 DIAGNOSIS — Z510 Encounter for antineoplastic radiation therapy: Secondary | ICD-10-CM | POA: Diagnosis not present

## 2020-07-22 ENCOUNTER — Other Ambulatory Visit: Payer: Self-pay | Admitting: Oncology

## 2020-07-24 ENCOUNTER — Ambulatory Visit
Admission: RE | Admit: 2020-07-24 | Discharge: 2020-07-24 | Disposition: A | Discharge status: Home / Self Care | Payer: Medicare Other | Source: Ambulatory Visit | Attending: Oncology | Admitting: Oncology

## 2020-07-24 ENCOUNTER — Other Ambulatory Visit: Payer: Self-pay

## 2020-07-24 DIAGNOSIS — C3411 Malignant neoplasm of upper lobe, right bronchus or lung: Secondary | ICD-10-CM | POA: Diagnosis not present

## 2020-07-27 ENCOUNTER — Other Ambulatory Visit: Payer: Self-pay | Admitting: Oncology

## 2020-07-27 DIAGNOSIS — C3401 Malignant neoplasm of right main bronchus: Secondary | ICD-10-CM

## 2020-07-27 NOTE — Progress Notes (Signed)
DISCONTINUE ON PATHWAY REGIMEN - Non-Small Cell Lung     Administer weekly:     Paclitaxel      Carboplatin   **Always confirm dose/schedule in your pharmacy ordering system**  REASON: Continuation Of Treatment PRIOR TREATMENT: [id number]: Carboplatin AUC=2 + Paclitaxel 45 mg/m2 Weekly During Radiation TREATMENT RESPONSE: Partial Response (PR)  START ON PATHWAY REGIMEN - Non-Small Cell Lung     A cycle is every 14 days:     Durvalumab   **Always confirm dose/schedule in your pharmacy ordering system**  Patient Characteristics: Preoperative or Nonsurgical Candidate (Clinical Staging), Stage III - Nonsurgical Candidate (Nonsquamous and Squamous), PS = 0, 1 Therapeutic Status: Preoperative or Nonsurgical Candidate (Clinical Staging) AJCC T Category: cT3 AJCC N Category: cN3 AJCC M Category: cM0 AJCC 8 Stage Grouping: IIIC ECOG Performance Status: 1 Intent of Therapy: Curative Intent, Discussed with Patient

## 2020-07-27 NOTE — Progress Notes (Signed)
ON PATHWAY REGIMEN - Non-Small Cell Lung  No Change  Continue With Treatment as Ordered.  Original Decision Date/Time: 05/19/2020 13:13     Administer weekly:     Paclitaxel      Carboplatin   **Always confirm dose/schedule in your pharmacy ordering system**  Patient Characteristics: Preoperative or Nonsurgical Candidate (Clinical Staging), Stage III - Nonsurgical Candidate (Nonsquamous and Squamous), PS = 0, 1 Therapeutic Status: Preoperative or Nonsurgical Candidate (Clinical Staging) AJCC T Category: cT3 AJCC N Category: cN3 AJCC M Category: cM0 AJCC 8 Stage Grouping: IIIC ECOG Performance Status: 1 Intent of Therapy: Curative Intent, Discussed with Patient

## 2020-07-29 ENCOUNTER — Inpatient Hospital Stay: Payer: Medicare Other

## 2020-07-29 ENCOUNTER — Inpatient Hospital Stay: Payer: Medicare Other | Attending: Oncology

## 2020-07-29 ENCOUNTER — Encounter: Payer: Self-pay | Admitting: Oncology

## 2020-07-29 ENCOUNTER — Inpatient Hospital Stay (HOSPITAL_BASED_OUTPATIENT_CLINIC_OR_DEPARTMENT_OTHER): Payer: Medicare Other | Admitting: Oncology

## 2020-07-29 ENCOUNTER — Other Ambulatory Visit: Payer: Self-pay

## 2020-07-29 VITALS — BP 129/71 | HR 93 | Temp 98.0°F | Resp 16 | Ht 71.0 in | Wt 167.2 lb

## 2020-07-29 DIAGNOSIS — Z8616 Personal history of COVID-19: Secondary | ICD-10-CM | POA: Diagnosis not present

## 2020-07-29 DIAGNOSIS — I739 Peripheral vascular disease, unspecified: Secondary | ICD-10-CM | POA: Diagnosis not present

## 2020-07-29 DIAGNOSIS — Z5112 Encounter for antineoplastic immunotherapy: Secondary | ICD-10-CM | POA: Diagnosis present

## 2020-07-29 DIAGNOSIS — Z8551 Personal history of malignant neoplasm of bladder: Secondary | ICD-10-CM | POA: Diagnosis not present

## 2020-07-29 DIAGNOSIS — C3401 Malignant neoplasm of right main bronchus: Secondary | ICD-10-CM

## 2020-07-29 DIAGNOSIS — Z923 Personal history of irradiation: Secondary | ICD-10-CM | POA: Insufficient documentation

## 2020-07-29 DIAGNOSIS — I10 Essential (primary) hypertension: Secondary | ICD-10-CM | POA: Insufficient documentation

## 2020-07-29 DIAGNOSIS — E876 Hypokalemia: Secondary | ICD-10-CM

## 2020-07-29 DIAGNOSIS — C3411 Malignant neoplasm of upper lobe, right bronchus or lung: Secondary | ICD-10-CM | POA: Insufficient documentation

## 2020-07-29 DIAGNOSIS — Z7189 Other specified counseling: Secondary | ICD-10-CM | POA: Diagnosis not present

## 2020-07-29 DIAGNOSIS — Z79899 Other long term (current) drug therapy: Secondary | ICD-10-CM | POA: Insufficient documentation

## 2020-07-29 DIAGNOSIS — T66XXXA Radiation sickness, unspecified, initial encounter: Secondary | ICD-10-CM

## 2020-07-29 DIAGNOSIS — K208 Other esophagitis without bleeding: Secondary | ICD-10-CM | POA: Diagnosis not present

## 2020-07-29 DIAGNOSIS — Z87891 Personal history of nicotine dependence: Secondary | ICD-10-CM | POA: Insufficient documentation

## 2020-07-29 LAB — CBC WITH DIFFERENTIAL/PLATELET
Abs Immature Granulocytes: 0.02 10*3/uL (ref 0.00–0.07)
Basophils Absolute: 0 10*3/uL (ref 0.0–0.1)
Basophils Relative: 0 %
Eosinophils Absolute: 0.1 10*3/uL (ref 0.0–0.5)
Eosinophils Relative: 1 %
HCT: 29.4 % — ABNORMAL LOW (ref 39.0–52.0)
Hemoglobin: 9.6 g/dL — ABNORMAL LOW (ref 13.0–17.0)
Immature Granulocytes: 0 %
Lymphocytes Relative: 21 %
Lymphs Abs: 1.1 10*3/uL (ref 0.7–4.0)
MCH: 32.1 pg (ref 26.0–34.0)
MCHC: 32.7 g/dL (ref 30.0–36.0)
MCV: 98.3 fL (ref 80.0–100.0)
Monocytes Absolute: 0.6 10*3/uL (ref 0.1–1.0)
Monocytes Relative: 11 %
Neutro Abs: 3.5 10*3/uL (ref 1.7–7.7)
Neutrophils Relative %: 67 %
Platelets: 248 10*3/uL (ref 150–400)
RBC: 2.99 MIL/uL — ABNORMAL LOW (ref 4.22–5.81)
RDW: 15.5 % (ref 11.5–15.5)
WBC: 5.2 10*3/uL (ref 4.0–10.5)
nRBC: 0 % (ref 0.0–0.2)

## 2020-07-29 LAB — COMPREHENSIVE METABOLIC PANEL
ALT: 11 U/L (ref 0–44)
AST: 19 U/L (ref 15–41)
Albumin: 2.9 g/dL — ABNORMAL LOW (ref 3.5–5.0)
Alkaline Phosphatase: 49 U/L (ref 38–126)
Anion gap: 11 (ref 5–15)
BUN: 22 mg/dL (ref 8–23)
CO2: 24 mmol/L (ref 22–32)
Calcium: 8.4 mg/dL — ABNORMAL LOW (ref 8.9–10.3)
Chloride: 104 mmol/L (ref 98–111)
Creatinine, Ser: 1.59 mg/dL — ABNORMAL HIGH (ref 0.61–1.24)
GFR, Estimated: 47 mL/min — ABNORMAL LOW (ref >60)
Glucose, Bld: 149 mg/dL — ABNORMAL HIGH (ref 70–99)
Potassium: 3.3 mmol/L — ABNORMAL LOW (ref 3.5–5.1)
Sodium: 139 mmol/L (ref 135–145)
Total Bilirubin: 0.4 mg/dL (ref 0.3–1.2)
Total Protein: 6 g/dL — ABNORMAL LOW (ref 6.5–8.1)

## 2020-07-29 LAB — TSH: TSH: 2.145 u[IU]/mL (ref 0.350–4.500)

## 2020-07-29 MED ORDER — HEPARIN SOD (PORK) LOCK FLUSH 100 UNIT/ML IV SOLN
INTRAVENOUS | Status: AC
  Filled 2020-07-29: qty 5

## 2020-07-29 MED ORDER — SODIUM CHLORIDE 0.9% FLUSH
10.0000 mL | Freq: Once | INTRAVENOUS | Status: AC
  Administered 2020-07-29: 10 mL via INTRAVENOUS
  Filled 2020-07-29: qty 10

## 2020-07-29 MED ORDER — HEPARIN SOD (PORK) LOCK FLUSH 100 UNIT/ML IV SOLN
500.0000 [IU] | Freq: Once | INTRAVENOUS | Status: AC | PRN
  Administered 2020-07-29: 500 [IU]
  Filled 2020-07-29: qty 5

## 2020-07-29 MED ORDER — POTASSIUM CHLORIDE CRYS ER 20 MEQ PO TBCR: 20.0000 meq | EXTENDED_RELEASE_TABLET | Freq: Every day | ORAL | 0 refills | Status: DC

## 2020-07-29 MED ORDER — SODIUM CHLORIDE 0.9 % IV SOLN
10.0000 mg/kg | Freq: Once | INTRAVENOUS | Status: AC
  Administered 2020-07-29: 740 mg via INTRAVENOUS
  Filled 2020-07-29: qty 4.8

## 2020-07-29 MED ORDER — SODIUM CHLORIDE 0.9 % IV SOLN
Freq: Once | INTRAVENOUS | Status: AC
  Filled 2020-07-29: qty 250

## 2020-07-29 NOTE — Progress Notes (Signed)
Pt tolerated infusion well with no signs of complications. VSS. RN educated pt on the importance of notifying the clinic if any complications occur at home, pt verbalized understanding and all questions answered at this time. Pt stable for discharge.   Rameen Gohlke [employer]

## 2020-07-29 NOTE — Progress Notes (Signed)
Hematology/Oncology Consult note [facility]  Telephone:([phone] Fax:([phone]  Patient Care Team: Perrin Maltese, MD as PCP - General (Internal Medicine) Telford Nab, RN as Oncology Nurse Navigator Sindy Guadeloupe, MD as Consulting Physician (Hematology and Oncology)   Name of the patient: Martin Jennings  [id number]  [birth year]   Date of visit: 07/29/20  Diagnosis- stage IIIc adenocarcinoma of the lung cT3 cN3 M0  Chief complaint/ Reason for visit-discuss CT scan results and on treatment assessment prior to cycle 1 of maintenance durvalumab  Heme/Onc history: Patient is a 70 year old male with a history of tobacco dependence who was admitted to the hospital for symptoms of acute onset shortness of breath and was found to have Covid pneumonia. He underwent CT angio chest which showed extensive mediastinal as well as bilateral hilar adenopathy. Masslike opacity with architectural distortion involving the right upper lobe measuring 5.7 x 2.9 x 2.3 cm and groundglass opacities bilaterally. Mass involving the left adrenal gland as well as a smaller right adrenal gland possibly indicating adrenal metastases.  This was followed by a PET CT scan 2 weeks later which showed the consolidative process in the right upper lobesuv7.88 concerning for infiltrating tumor. It appears to involve the pleura associated interstitial thickening was also seen. Bulky right hilar and mediastinal adenopathy. Enlarged somewhat necrotic appearing left supraclavicular lymph node was also hypermetabolic. Adrenal gland was not hypermetabolic and was consistent with a benign adenoma. No metastatic disease involving liver. No features of distant metastatic disease.  Pathology showed metastatic non-small cell lung cancer which was TTF-1 positive consistent with adenocarcinoma.  Interval history-hemoptysis has resolved.  He still has ongoing fatigue and burning pain when he  swallows his food which is slowly getting better.  Appetite is fair.  ECOG PS- 2 Pain scale- 0 Opioid associated constipation- no  Review of systems- Review of Systems  Constitutional: Positive for malaise/fatigue. Negative for chills, fever and weight loss.  HENT: Negative for congestion, ear discharge and nosebleeds.   Eyes: Negative for blurred vision.  Respiratory: Negative for cough, hemoptysis, sputum production, shortness of breath and wheezing.   Cardiovascular: Negative for chest pain, palpitations, orthopnea and claudication.  Gastrointestinal: Negative for abdominal pain, blood in stool, constipation, diarrhea, heartburn, melena, nausea and vomiting.       Burning pain during swallowing  Genitourinary: Negative for dysuria, flank pain, frequency, hematuria and urgency.  Musculoskeletal: Negative for back pain, joint pain and myalgias.  Skin: Negative for rash.  Neurological: Negative for dizziness, tingling, focal weakness, seizures, weakness and headaches.  Endo/Heme/Allergies: Does not bruise/bleed easily.  Psychiatric/Behavioral: Negative for depression and suicidal ideas. The patient does not have insomnia.       No Known Allergies   Past Medical History:  Diagnosis Date  . Bladder cancer ([place]) 2012  . COVID-19 virus infection 04/03/2020  . Hypertension   . Lung cancer ([place])   . Peripheral vascular disease [facility])      Past Surgical History:  Procedure Laterality Date  . BLADDER REMOVAL    . LOWER EXTREMITY ANGIOGRAPHY Left 04/12/2017   Procedure: Lower Extremity Angiography;  Surgeon: Katha Cabal, MD;  Location: [place] [facility];  Service: Cardiovascular;  Laterality: Left;  . PORTA CATH INSERTION N/A 05/22/2020   Procedure: PORTA CATH INSERTION;  Surgeon: Algernon Huxley, MD;  Location: [place] [facility];  Service: Cardiovascular;  Laterality: N/A;  . uretostomy    . VASCULAR SURGERY      Social  History   Socioeconomic History  . Marital  status: Married    Spouse name: Not on file  . Number of children: Not on file  . Years of education: Not on file  . Highest education level: Not on file  Occupational History  . Not on file  Tobacco Use  . Smoking status: Former Smoker    Packs/day: 1.25    Years: 50.00    Pack years: 62.50    Types: Cigarettes    Quit date: 03/13/2020    Years since quitting: 0.3  . Smokeless tobacco: Never Used  Vaping Use  . Vaping Use: Never used  Substance and Sexual Activity  . Alcohol use: No  . Drug use: Not Currently    Types: Marijuana  . Sexual activity: Not Currently  Other Topics Concern  . Not on file  Social History Narrative  . Not on file   Social Determinants of Health   Financial Resource Strain: Not on file  Food Insecurity: Not on file  Transportation Needs: Not on file  Physical Activity: Not on file  Stress: Not on file  Social Connections: Not on file  Intimate Partner Violence: Not on file    History reviewed. No pertinent family history.   Current Outpatient Medications:  .  amLODipine (NORVASC) 10 MG tablet, Take 10 mg by mouth daily. , Disp: , Rfl:  .  atorvastatin (LIPITOR) 20 MG tablet, Take 20 mg by mouth every evening. , Disp: , Rfl:  .  clopidogrel (PLAVIX) 75 MG tablet, Take 75 mg by mouth daily. , Disp: , Rfl:  .  oxyCODONE (OXY IR/ROXICODONE) 5 MG immediate release tablet, TAKE 1 TABLET BY MOUTH EVERY 8 HOURS AS NEEDED FOR SEVERE PAIN, Disp: 30 tablet, Rfl: 0 .  potassium chloride SA (KLOR-CON) 20 MEQ tablet, Take 1 tablet (20 mEq total) by mouth daily., Disp: 14 tablet, Rfl: 0 No current facility-administered medications for this visit.  Facility-Administered Medications Ordered in Other Visits:  .  durvalumab (IMFINZI) 740 mg in sodium chloride 0.9 % 100 mL chemo infusion, 10 mg/kg (Treatment Plan Recorded), Intravenous, Once, Sindy Guadeloupe, MD, Last Rate: 115 mL/hr at 07/29/20 1213, 740 mg at 07/29/20 1213 .  heparin lock flush 100 unit/mL,  500 Units, Intracatheter, Once PRN, Sindy Guadeloupe, MD  Physical exam:  Vitals:   07/29/20 1143  BP: 129/71  Pulse: 93  Resp: 16  Temp: 98 F (36.7 C)  TempSrc: Oral  SpO2: 95%  Weight: 167 lb 3.2 oz (75.8 kg)  Height: 5\' 11"  (1.803 m)   Physical Exam Constitutional:      General: He is not in acute distress. Eyes:     Extraocular Movements: EOM normal.  Cardiovascular:     Rate and Rhythm: Normal rate and regular rhythm.     Heart sounds: Normal heart sounds.  Pulmonary:     Effort: Pulmonary effort is normal.     Breath sounds: Normal breath sounds.  Abdominal:     General: Bowel sounds are normal.     Palpations: Abdomen is soft.  Skin:    General: Skin is warm and dry.  Neurological:     Mental Status: He is alert and oriented to person, place, and time.      CMP Latest Ref Rng & Units 07/29/2020  Glucose 70 - 99 mg/dL 149(H)  BUN 8 - 23 mg/dL 22  Creatinine 0.61 - 1.24 mg/dL 1.59(H)  Sodium 135 - 145 mmol/L 139  Potassium 3.5 -  5.1 mmol/L 3.3(L)  Chloride 98 - 111 mmol/L 104  CO2 22 - 32 mmol/L 24  Calcium 8.9 - 10.3 mg/dL 8.4(L)  Total Protein 6.5 - 8.1 g/dL 6.0(L)  Total Bilirubin 0.3 - 1.2 mg/dL 0.4  Alkaline Phos 38 - 126 U/L 49  AST 15 - 41 U/L 19  ALT 0 - 44 U/L 11   CBC Latest Ref Rng & Units 07/29/2020  WBC 4.0 - 10.5 K/uL 5.2  Hemoglobin 13.0 - 17.0 g/dL 9.6(L)  Hematocrit 39.0 - 52.0 % 29.4(L)  Platelets 150 - 400 K/uL 248    No images are attached to the encounter.  CT CHEST ABDOMEN PELVIS WO CONTRAST  Result Date: 07/25/2020 CLINICAL DATA:  Stage IIIc non-small cell right upper lobe lung adenocarcinoma status post concurrent chemoradiation therapy. Restaging. Remote history of bladder cancer. EXAM: CT CHEST, ABDOMEN AND PELVIS WITHOUT CONTRAST TECHNIQUE: Multidetector CT imaging of the chest, abdomen and pelvis was performed following the standard protocol without IV contrast. COMPARISON:  04/22/2020 PET-CT. 04/05/2020 chest CT angiogram.  06/29/2016 CT chest, abdomen and pelvis. FINDINGS: CT CHEST FINDINGS Cardiovascular: Normal heart size. No significant pericardial effusion/thickening. Three-vessel coronary atherosclerosis. Atherosclerotic nonaneurysmal thoracic aorta. Dilated main pulmonary artery. Right internal jugular Port-A-Cath terminates in the upper third of the SVC. Mediastinum/Nodes: No discrete thyroid nodules. Unremarkable esophagus. No axillary adenopathy. Previously visualized enlarged 1.2 cm left supraclavicular node has decreased to 0.7 cm (series 2/image 6). Enlarged 2.6 cm right paratracheal node (series 2/image 24), stable. Enlarged 1.1 cm subcarinal node (series 2/image 33), decreased from 1.5 cm. Enlarged 1.2 cm AP window node (series 2/image 30), stable. Enlarged 1.9 cm right hilar node (series 2/image 35), decreased from 2.4 cm. No discrete left hilar adenopathy on these noncontrast images. Lungs/Pleura: No pneumothorax. Small dependent right pleural effusion, new. No left pleural effusion. Moderate centrilobular emphysema. Irregular masslike focus of consolidation in the peripheral right upper lobe measures 10.7 x 4.3 cm (series 3/image 58), previously 7.9 x 3.3 cm on 04/22/2020 PET-CT study using similar measurement technique, increased. No new significant pulmonary nodules. Widespread patchy peripheral reticulation and ground-glass opacity throughout both lungs with associated mild traction bronchiectasis, suggestive of evolving postinflammatory fibrosis. Previously visualized patchy consolidation at the left lung base has resolved. Musculoskeletal: No aggressive appearing focal osseous lesions. Mild thoracic spondylosis. CT ABDOMEN PELVIS FINDINGS Hepatobiliary: Normal liver size. Subcentimeter hypodense left liver dome lesion is too small to characterize and is unchanged, more likely benign. No new liver lesions. Cholelithiasis. No biliary ductal dilatation. Pancreas: Normal, with no mass or duct dilation. Spleen: Normal  size. No mass. Adrenals/Urinary Tract: Left adrenal 2.6 cm nodule with density 6 HU, stable, compatible with a benign adenoma. No discrete right adrenal nodule. No hydronephrosis. No renal stones. Simple 1.0 cm upper left renal cyst. Status post cystectomy with ileal conduit urinary diversion in the ventral right abdominal wall. Is of conduit is decompressed and appears normal. Stomach/Bowel: Small hiatal hernia. Otherwise normal nondistended stomach. Normal caliber small bowel with no small bowel wall thickening. Normal appendix. Oral contrast transits to the colon. Normal large bowel with no diverticulosis, large bowel wall thickening or pericolonic fat stranding. Vascular/Lymphatic: Atherosclerotic nonaneurysmal abdominal aorta. Multiple arterial stents are present extending from the aortic bifurcation into the external iliac arteries bilaterally. Femoral-femoral arterial bypass graft in place. No pathologically enlarged lymph nodes in the abdomen or pelvis. Reproductive: Prostatectomy. Other: No pneumoperitoneum, ascites or focal fluid collection. Musculoskeletal: No aggressive appearing focal osseous lesions. Moderate lumbar spondylosis. IMPRESSION: 1. Interval mixed  response to therapy. 2. Irregular masslike consolidation in the peripheral right upper lobe is increased. Although a portion of this could represent early radiation change, progressive primary tumor cannot be excluded. New small dependent right pleural effusion. 3. Decreased left supraclavicular, right hilar and subcarinal lymphadenopathy. Stable paratracheal and AP window adenopathy. 4. No evidence of metastatic disease in the abdomen or pelvis. 5. Suggestion of background evolving postinflammatory fibrosis throughout both lungs, probably due to patient's history of COVID-19 infection in September. 6. Chronic findings include: Three-vessel coronary atherosclerosis. Dilated main pulmonary artery, suggesting pulmonary arterial hypertension.  Cholelithiasis. Small hiatal hernia. Left adrenal adenoma. Aortic Atherosclerosis (ICD10-I70.0) and Emphysema (ICD10-J43.9). Electronically Signed   By: Ilona Sorrel M.D.   On: 07/25/2020 09:33     Assessment and plan- Patient is a 70 y.o. male withstage IIIc adenocarcinoma of the right upper lobe of the lung cT4 N3 M0.   He is here for CT scan results discussion and on treatment assessment prior to cycle 1 of maintenance durvalumab  I have reviewed CT chest abdomen and pelvis images independently and discussed findings with the patient.  Overall there has been decrease in the size of mediastinal and hilar adenopathy.  There appears to be an increase in the size in the right upper lobe mass But it is unclear if this could also represent radiation changes.  Symptomatically patient is improving with resolution of his hemoptysis.  He will therefore proceed with maintenance durvalumab every 2 weeks for up to 1 year based on partial response in the scans.  I will monitor the right upper lobe area closely with a repeat scan in 2 to 3 months time.  Discussed risks and benefits of durvalumab including all but not limited to autoimmune side effect such as thyroiditis, pneumonitis, colitis and need to monitor liver and kidney functions.  Patient understands and agrees to proceed as planned.  Treatment will be given with a curative intent.  He will proceed with cycle 1 today and I will see him back in 2 weeks for cycle 2.  Hypokalemia: We will send him a prescription for oral potassium 20 mEq daily for 2 weeks.  Hypoalbuminemia: I have encouraged patient to start drinking more protein shakes such as Ensure 2-3 times a day.  Radiation esophagitis: Symptomatically slowly getting better.  He is not receiving radiation anymore.  Continue to monitor  Patient was saturating 95% on room air today and we will have him assessed at home if he continues to require home oxygen  Visit Diagnosis 1. Goals of care,  counseling/discussion   2. Encounter for antineoplastic immunotherapy   3. Radiation esophagitis   4. Malignant neoplasm of upper lobe of right lung ([facility])   5. Hypokalemia      Dr. Randa Evens, MD, MPH [facility] [phone] 07/29/2020 12:32 PM

## 2020-07-30 LAB — T4: T4, Total: 8 ug/dL (ref 4.5–12.0)

## 2020-08-07 ENCOUNTER — Other Ambulatory Visit: Payer: Self-pay | Admitting: Oncology

## 2020-08-12 ENCOUNTER — Inpatient Hospital Stay: Payer: Medicare Other

## 2020-08-12 ENCOUNTER — Inpatient Hospital Stay: Payer: Medicare Other | Admitting: Oncology

## 2020-08-21 ENCOUNTER — Inpatient Hospital Stay (HOSPITAL_BASED_OUTPATIENT_CLINIC_OR_DEPARTMENT_OTHER): Payer: Medicare Other | Admitting: Oncology

## 2020-08-21 ENCOUNTER — Encounter: Payer: Self-pay | Admitting: Radiation Oncology

## 2020-08-21 ENCOUNTER — Inpatient Hospital Stay: Payer: Medicare Other

## 2020-08-21 ENCOUNTER — Encounter: Payer: Self-pay | Admitting: Oncology

## 2020-08-21 ENCOUNTER — Ambulatory Visit
Admission: RE | Admit: 2020-08-21 | Discharge: 2020-08-21 | Disposition: A | Discharge status: Home / Self Care | Payer: Medicare Other | Source: Ambulatory Visit | Attending: Radiation Oncology | Admitting: Radiation Oncology

## 2020-08-21 ENCOUNTER — Other Ambulatory Visit: Payer: Self-pay

## 2020-08-21 VITALS — BP 124/70 | HR 91 | Temp 96.7°F | Wt 171.8 lb

## 2020-08-21 VITALS — BP 132/79 | HR 68 | Resp 18

## 2020-08-21 VITALS — BP 141/85 | HR 84 | Temp 97.0°F | Resp 20 | Wt 171.5 lb

## 2020-08-21 DIAGNOSIS — R590 Localized enlarged lymph nodes: Secondary | ICD-10-CM | POA: Diagnosis not present

## 2020-08-21 DIAGNOSIS — C3411 Malignant neoplasm of upper lobe, right bronchus or lung: Secondary | ICD-10-CM | POA: Insufficient documentation

## 2020-08-21 DIAGNOSIS — D631 Anemia in chronic kidney disease: Secondary | ICD-10-CM | POA: Diagnosis not present

## 2020-08-21 DIAGNOSIS — C3400 Malignant neoplasm of unspecified main bronchus: Secondary | ICD-10-CM

## 2020-08-21 DIAGNOSIS — Z9221 Personal history of antineoplastic chemotherapy: Secondary | ICD-10-CM | POA: Diagnosis not present

## 2020-08-21 DIAGNOSIS — N1832 Chronic kidney disease, stage 3b: Secondary | ICD-10-CM

## 2020-08-21 DIAGNOSIS — Z5112 Encounter for antineoplastic immunotherapy: Secondary | ICD-10-CM | POA: Diagnosis not present

## 2020-08-21 DIAGNOSIS — Z923 Personal history of irradiation: Secondary | ICD-10-CM | POA: Diagnosis not present

## 2020-08-21 DIAGNOSIS — C3401 Malignant neoplasm of right main bronchus: Secondary | ICD-10-CM

## 2020-08-21 LAB — COMPREHENSIVE METABOLIC PANEL
ALT: 16 U/L (ref 0–44)
AST: 20 U/L (ref 15–41)
Albumin: 3.2 g/dL — ABNORMAL LOW (ref 3.5–5.0)
Alkaline Phosphatase: 45 U/L (ref 38–126)
Anion gap: 11 (ref 5–15)
BUN: 31 mg/dL — ABNORMAL HIGH (ref 8–23)
CO2: 20 mmol/L — ABNORMAL LOW (ref 22–32)
Calcium: 8.9 mg/dL (ref 8.9–10.3)
Chloride: 108 mmol/L (ref 98–111)
Creatinine, Ser: 1.54 mg/dL — ABNORMAL HIGH (ref 0.61–1.24)
GFR, Estimated: 49 mL/min — ABNORMAL LOW (ref >60)
Glucose, Bld: 108 mg/dL — ABNORMAL HIGH (ref 70–99)
Potassium: 3.9 mmol/L (ref 3.5–5.1)
Sodium: 139 mmol/L (ref 135–145)
Total Bilirubin: 0.4 mg/dL (ref 0.3–1.2)
Total Protein: 6.7 g/dL (ref 6.5–8.1)

## 2020-08-21 LAB — CBC WITH DIFFERENTIAL/PLATELET
Abs Immature Granulocytes: 0.02 10*3/uL (ref 0.00–0.07)
Basophils Absolute: 0 10*3/uL (ref 0.0–0.1)
Basophils Relative: 0 %
Eosinophils Absolute: 0.2 10*3/uL (ref 0.0–0.5)
Eosinophils Relative: 3 %
HCT: 32 % — ABNORMAL LOW (ref 39.0–52.0)
Hemoglobin: 10.5 g/dL — ABNORMAL LOW (ref 13.0–17.0)
Immature Granulocytes: 0 %
Lymphocytes Relative: 23 %
Lymphs Abs: 1.4 10*3/uL (ref 0.7–4.0)
MCH: 31.9 pg (ref 26.0–34.0)
MCHC: 32.8 g/dL (ref 30.0–36.0)
MCV: 97.3 fL (ref 80.0–100.0)
Monocytes Absolute: 0.6 10*3/uL (ref 0.1–1.0)
Monocytes Relative: 9 %
Neutro Abs: 4 10*3/uL (ref 1.7–7.7)
Neutrophils Relative %: 65 %
Platelets: 188 10*3/uL (ref 150–400)
RBC: 3.29 MIL/uL — ABNORMAL LOW (ref 4.22–5.81)
RDW: 13.2 % (ref 11.5–15.5)
WBC: 6.2 10*3/uL (ref 4.0–10.5)
nRBC: 0 % (ref 0.0–0.2)

## 2020-08-21 LAB — TSH: TSH: 1.637 u[IU]/mL (ref 0.350–4.500)

## 2020-08-21 MED ORDER — SODIUM CHLORIDE 0.9 % IV SOLN
Freq: Once | INTRAVENOUS | Status: AC
  Filled 2020-08-21: qty 250

## 2020-08-21 MED ORDER — HEPARIN SOD (PORK) LOCK FLUSH 100 UNIT/ML IV SOLN
500.0000 [IU] | Freq: Once | INTRAVENOUS | Status: AC
  Administered 2020-08-21: 500 [IU] via INTRAVENOUS
  Filled 2020-08-21: qty 5

## 2020-08-21 MED ORDER — HEPARIN SOD (PORK) LOCK FLUSH 100 UNIT/ML IV SOLN
INTRAVENOUS | Status: AC
  Filled 2020-08-21: qty 5

## 2020-08-21 MED ORDER — SODIUM CHLORIDE 0.9 % IV SOLN
10.0000 mg/kg | Freq: Once | INTRAVENOUS | Status: AC
  Administered 2020-08-21: 740 mg via INTRAVENOUS
  Filled 2020-08-21: qty 10

## 2020-08-21 MED ORDER — SODIUM CHLORIDE 0.9% FLUSH
10.0000 mL | Freq: Once | INTRAVENOUS | Status: AC
  Administered 2020-08-21: 10 mL via INTRAVENOUS
  Filled 2020-08-21: qty 10

## 2020-08-21 MED ORDER — HEPARIN SOD (PORK) LOCK FLUSH 100 UNIT/ML IV SOLN
500.0000 [IU] | Freq: Once | INTRAVENOUS | Status: DC | PRN
  Filled 2020-08-21: qty 5

## 2020-08-21 NOTE — Progress Notes (Signed)
Radiation Oncology Follow up Note  Name: Martin Jennings   Date:   08/21/2020 MRN:  [id number] DOB: 1950/10/20    [name] presents to the clinic today for 1 month follow-up status post concurrent chemoradiation therapy for stage IIIc (T3 N3 M0) adenocarcinoma the right upper lobe.  REFERRING PROVIDER: Perrin Maltese, MD  HPI: Patient is a 70 year old male now at 1 month have completed concurrent chemoradiation therapy for stage IIIc adenocarcinoma the right upper lobe.  Seen today in routine follow-up he is doing well.  He has a mild nonproductive cough no hemoptysis.Marland Kitchen  He had a recent CT scan showing mixed response to therapy.  The mass consolidation peripheral right upper lobe is increased although this certainly is my interpretation radiation change.  He has decreased left supraclavicular right hilar and subcarinal lymphadenopathy.  No evidence of metastatic disease.  He is currently on immunotherapy with maintenance durvalumab which she is tolerating well.  COMPLICATIONS OF TREATMENT: none  FOLLOW UP COMPLIANCE: keeps appointments   PHYSICAL EXAM:  BP 124/70   Pulse 91   Temp (!) 96.7 F (35.9 C) (Tympanic)   Wt 171 lb 12.8 oz (77.9 kg)   SpO2 95% Comment: room air  BMI 23.96 kg/m  Well-developed well-nourished patient in NAD. HEENT reveals PERLA, EOMI, discs not visualized.  Oral cavity is clear. No oral mucosal lesions are identified. Neck is clear without evidence of cervical or supraclavicular adenopathy. Lungs are clear to A&P. Cardiac examination is essentially unremarkable with regular rate and rhythm without murmur rub or thrill. Abdomen is benign with no organomegaly or masses noted. Motor sensory and DTR levels are equal and symmetric in the upper and lower extremities. Cranial nerves II through XII are grossly intact. Proprioception is intact. No peripheral adenopathy or edema is identified. No motor or sensory levels are noted. Crude visual fields are within normal  range.  RADIOLOGY RESULTS: CT scan reviewed compatible with above-stated findings  PLAN: Present time patient is doing well currently on maintenance immunotherapy tolerating well.  Pleased with his overall progress.  I do believe the changes in his chest are consistent with radiation scarring.  I have asked to see the patient back in 3 to 4 months we will follow up with any CT scans in the near future.  Patient knows to call with any concerns.  I would like to take this opportunity to thank you for allowing me to participate in the care of your patient.Noreene Filbert, MD

## 2020-08-21 NOTE — Progress Notes (Signed)
Martin Jennings 1.54. Per Dr Janese Banks, okay to proceed with treatment.   Patient tolerated treatment well. Patient and VSS. Discharged home.

## 2020-08-22 LAB — T4: T4, Total: 7.6 ug/dL (ref 4.5–12.0)

## 2020-08-22 NOTE — Progress Notes (Signed)
Hematology/Oncology Consult note [facility]  Telephone:([phone] [phone]  Patient Care Team: Perrin Maltese, MD as PCP - General (Internal Medicine) Telford Nab, RN as Oncology Nurse Navigator Sindy Guadeloupe, MD as Consulting Physician (Hematology and Oncology)   Name of the patient: Martin [place]  [id number]  10/31/1950   Date of visit: 08/22/20  Diagnosis- stage IIIc adenocarcinoma of the lung cT3 cN3 M0   Chief complaint/ Reason for visit-on treatment assessment prior to cycle 2 of maintenance durvalumab  Heme/Onc history: Patient is a 70 year old male with a history of tobacco dependence who was admitted to the hospital for symptoms of acute onset shortness of breath and was found to have Covid pneumonia. He underwent CT angio chest which showed extensive mediastinal as well as bilateral hilar adenopathy. Masslike opacity with architectural distortion involving the right upper lobe measuring 5.7 x 2.9 x 2.3 cm and groundglass opacities bilaterally. Mass involving the left adrenal gland as well as a smaller right adrenal gland possibly indicating adrenal metastases.  This was followed by a PET CT scan 2 weeks later which showed the consolidative process in the right upper lobesuv7.88 concerning for infiltrating tumor. It appears to involve the pleura associated interstitial thickening was also seen. Bulky right hilar and mediastinal adenopathy. Enlarged somewhat necrotic appearing left supraclavicular lymph node was also hypermetabolic. Adrenal gland was not hypermetabolic and was consistent with a benign adenoma. No metastatic disease involving liver. No features of distant metastatic disease.  Pathology showed metastatic non-small cell lung cancer which was TTF-1 positive consistent with adenocarcinoma  Interval history-patient reports doing well. He has not had any episodes of hemoptysis. He is not using his oxygen  anymore.  ECOG PS- 1 Pain scale- 0 Opioid associated constipation- no  Review of systems- Review of Systems  Constitutional: Positive for malaise/fatigue. Negative for chills, fever and weight loss.  HENT: Negative for congestion, ear discharge and nosebleeds.   Eyes: Negative for blurred vision.  Respiratory: Negative for cough, hemoptysis, sputum production, shortness of breath and wheezing.   Cardiovascular: Negative for chest pain, palpitations, orthopnea and claudication.  Gastrointestinal: Negative for abdominal pain, blood in stool, constipation, diarrhea, heartburn, melena, nausea and vomiting.  Genitourinary: Negative for dysuria, flank pain, frequency, hematuria and urgency.  Musculoskeletal: Negative for back pain, joint pain and myalgias.  Skin: Negative for rash.  Neurological: Negative for dizziness, tingling, focal weakness, seizures, weakness and headaches.  Endo/Heme/Allergies: Does not bruise/bleed easily.  Psychiatric/Behavioral: Negative for depression and suicidal ideas. The patient does not have insomnia.      No Known Allergies   Past Medical History:  Diagnosis Date  . Bladder cancer ([place]) 2012  . COVID-19 virus infection 04/03/2020  . Hypertension   . Lung cancer ([place])   . Peripheral vascular disease [facility])      Past Surgical History:  Procedure Laterality Date  . BLADDER REMOVAL    . LOWER EXTREMITY ANGIOGRAPHY Left 04/12/2017   Procedure: Lower Extremity Angiography;  Surgeon: Katha Cabal, MD;  Location: [place] [facility];  Service: Cardiovascular;  Laterality: Left;  . PORTA CATH INSERTION N/A 05/22/2020   Procedure: PORTA CATH INSERTION;  Surgeon: Algernon Huxley, MD;  Location: [place] [facility];  Service: Cardiovascular;  Laterality: N/A;  . uretostomy    . VASCULAR SURGERY      Social History   Socioeconomic History  . Marital status: Married    Spouse name: Not on file  . Number of  children: Not on file  . Years of education:  Not on file  . Highest education level: Not on file  Occupational History  . Not on file  Tobacco Use  . Smoking status: Former Smoker    Packs/day: 1.25    Years: 50.00    Pack years: 62.50    Types: Cigarettes    Quit date: 03/13/2020    Years since quitting: 0.4  . Smokeless tobacco: Never Used  Vaping Use  . Vaping Use: Never used  Substance and Sexual Activity  . Alcohol use: No  . Drug use: Not Currently    Types: Marijuana  . Sexual activity: Not Currently  Other Topics Concern  . Not on file  Social History Narrative  . Not on file   Social Determinants of Health   Financial Resource Strain: Not on file  Food Insecurity: Not on file  Transportation Needs: Not on file  Physical Activity: Not on file  Stress: Not on file  Social Connections: Not on file  Intimate Partner Violence: Not on file    No family history on file.   Current Outpatient Medications:  .  amLODipine (NORVASC) 10 MG tablet, Take 10 mg by mouth daily. , Disp: , Rfl:  .  atorvastatin (LIPITOR) 20 MG tablet, Take 20 mg by mouth every evening. , Disp: , Rfl:  .  carvedilol (COREG) 6.25 MG tablet, Take 6.25 mg by mouth 2 (two) times daily., Disp: , Rfl:  .  clopidogrel (PLAVIX) 75 MG tablet, Take 75 mg by mouth daily. , Disp: , Rfl:  .  potassium chloride SA (KLOR-CON) 20 MEQ tablet, Take 1 tablet (20 mEq total) by mouth daily., Disp: 14 tablet, Rfl: 0 .  oxyCODONE (OXY IR/ROXICODONE) 5 MG immediate release tablet, TAKE 1 TABLET BY MOUTH EVERY 8 HOURS AS NEEDED FOR SEVERE PAIN (Patient not taking: Reported on 08/21/2020), Disp: 30 tablet, Rfl: 0  Physical exam:  Vitals:   08/21/20 1109  BP: (!) 141/85  Pulse: 84  Resp: 20  Temp: (!) 97 F (36.1 C)  TempSrc: Tympanic  SpO2: 95%  Weight: 171 lb 8 oz (77.8 kg)   Physical Exam Constitutional:      General: He is not in acute distress. Eyes:     Extraocular Movements: EOM normal.  Cardiovascular:     Rate and Rhythm: Normal rate and  regular rhythm.     Heart sounds: Normal heart sounds.  Pulmonary:     Effort: Pulmonary effort is normal.     Breath sounds: Normal breath sounds.  Abdominal:     General: Bowel sounds are normal.     Palpations: Abdomen is soft.  Skin:    General: Skin is warm and dry.  Neurological:     Mental Status: He is alert and oriented to person, place, and time.      CMP Latest Ref Rng & Units 08/21/2020  Glucose 70 - 99 mg/dL 108(H)  BUN 8 - 23 mg/dL 31(H)  Creatinine 0.61 - 1.24 mg/dL 1.54(H)  Sodium 135 - 145 mmol/L 139  Potassium 3.5 - 5.1 mmol/L 3.9  Chloride 98 - 111 mmol/L 108  CO2 22 - 32 mmol/L 20(L)  Calcium 8.9 - 10.3 mg/dL 8.9  Total Protein 6.5 - 8.1 g/dL 6.7  Total Bilirubin 0.3 - 1.2 mg/dL 0.4  Alkaline Phos 38 - 126 U/L 45  AST 15 - 41 U/L 20  ALT 0 - 44 U/L 16   CBC Latest Ref Rng & Units  08/21/2020  WBC 4.0 - 10.5 K/uL 6.2  Hemoglobin 13.0 - 17.0 g/dL 10.5(L)  Hematocrit 39.0 - 52.0 % 32.0(L)  Platelets 150 - 400 K/uL 188    No images are attached to the encounter.  CT CHEST ABDOMEN PELVIS WO CONTRAST  Result Date: 07/25/2020 CLINICAL DATA:  Stage IIIc non-small cell right upper lobe lung adenocarcinoma status post concurrent chemoradiation therapy. Restaging. Remote history of bladder cancer. EXAM: CT CHEST, ABDOMEN AND PELVIS WITHOUT CONTRAST TECHNIQUE: Multidetector CT imaging of the chest, abdomen and pelvis was performed following the standard protocol without IV contrast. COMPARISON:  04/22/2020 PET-CT. 04/05/2020 chest CT angiogram. 06/29/2016 CT chest, abdomen and pelvis. FINDINGS: CT CHEST FINDINGS Cardiovascular: Normal heart size. No significant pericardial effusion/thickening. Three-vessel coronary atherosclerosis. Atherosclerotic nonaneurysmal thoracic aorta. Dilated main pulmonary artery. Right internal jugular Port-A-Cath terminates in the upper third of the SVC. Mediastinum/Nodes: No discrete thyroid nodules. Unremarkable esophagus. No axillary  adenopathy. Previously visualized enlarged 1.2 cm left supraclavicular node has decreased to 0.7 cm (series 2/image 6). Enlarged 2.6 cm right paratracheal node (series 2/image 24), stable. Enlarged 1.1 cm subcarinal node (series 2/image 33), decreased from 1.5 cm. Enlarged 1.2 cm AP window node (series 2/image 30), stable. Enlarged 1.9 cm right hilar node (series 2/image 35), decreased from 2.4 cm. No discrete left hilar adenopathy on these noncontrast images. Lungs/Pleura: No pneumothorax. Small dependent right pleural effusion, new. No left pleural effusion. Moderate centrilobular emphysema. Irregular masslike focus of consolidation in the peripheral right upper lobe measures 10.7 x 4.3 cm (series 3/image 58), previously 7.9 x 3.3 cm on 04/22/2020 PET-CT study using similar measurement technique, increased. No new significant pulmonary nodules. Widespread patchy peripheral reticulation and ground-glass opacity throughout both lungs with associated mild traction bronchiectasis, suggestive of evolving postinflammatory fibrosis. Previously visualized patchy consolidation at the left lung base has resolved. Musculoskeletal: No aggressive appearing focal osseous lesions. Mild thoracic spondylosis. CT ABDOMEN PELVIS FINDINGS Hepatobiliary: Normal liver size. Subcentimeter hypodense left liver dome lesion is too small to characterize and is unchanged, more likely benign. No new liver lesions. Cholelithiasis. No biliary ductal dilatation. Pancreas: Normal, with no mass or duct dilation. Spleen: Normal size. No mass. Adrenals/Urinary Tract: Left adrenal 2.6 cm nodule with density 6 HU, stable, compatible with a benign adenoma. No discrete right adrenal nodule. No hydronephrosis. No renal stones. Simple 1.0 cm upper left renal cyst. Status post cystectomy with ileal conduit urinary diversion in the ventral right abdominal wall. Is of conduit is decompressed and appears normal. Stomach/Bowel: Small hiatal hernia. Otherwise  normal nondistended stomach. Normal caliber small bowel with no small bowel wall thickening. Normal appendix. Oral contrast transits to the colon. Normal large bowel with no diverticulosis, large bowel wall thickening or pericolonic fat stranding. Vascular/Lymphatic: Atherosclerotic nonaneurysmal abdominal aorta. Multiple arterial stents are present extending from the aortic bifurcation into the external iliac arteries bilaterally. Femoral-femoral arterial bypass graft in place. No pathologically enlarged lymph nodes in the abdomen or pelvis. Reproductive: Prostatectomy. Other: No pneumoperitoneum, ascites or focal fluid collection. Musculoskeletal: No aggressive appearing focal osseous lesions. Moderate lumbar spondylosis. IMPRESSION: 1. Interval mixed response to therapy. 2. Irregular masslike consolidation in the peripheral right upper lobe is increased. Although a portion of this could represent early radiation change, progressive primary tumor cannot be excluded. New small dependent right pleural effusion. 3. Decreased left supraclavicular, right hilar and subcarinal lymphadenopathy. Stable paratracheal and AP window adenopathy. 4. No evidence of metastatic disease in the abdomen or pelvis. 5. Suggestion of background evolving postinflammatory fibrosis  throughout both lungs, probably due to patient's history of COVID-19 infection in September. 6. Chronic findings include: Three-vessel coronary atherosclerosis. Dilated main pulmonary artery, suggesting pulmonary arterial hypertension. Cholelithiasis. Small hiatal hernia. Left adrenal adenoma. Aortic Atherosclerosis (ICD10-I70.0) and Emphysema (ICD10-J43.9). Electronically Signed   By: Ilona Sorrel M.D.   On: 07/25/2020 09:33     Assessment and plan- Patient is a 70 y.o. male withstage IIIc adenocarcinoma of the right upper lobe of the lung cT4 N3 M0.He is here for on treatment assessment prior to cycle 2 of maintenance durvalumab  Counseling to proceed  with cycle 2 of maintenance durvalumab today. He has baseline CKD and his creatinine has remained stable between 1.4-1.5. He is tolerating immunotherapy well without any significant side effects. After concurrent chemoradiation his hemoptysis has resolved he is not requiring any oxygen anymore. I will see him back in 2 weeks for cycle 3 of maintenance durvalumab   Visit Diagnosis 1. Encounter for antineoplastic immunotherapy   2. Malignant neoplasm of upper lobe of right lung [facility])      Dr. Randa Evens, MD, MPH [facility] at [facility] [phone] 08/22/2020 12:58 PM

## 2020-08-25 ENCOUNTER — Ambulatory Visit: Payer: Medicare Other | Admitting: Radiation Oncology

## 2020-09-04 ENCOUNTER — Inpatient Hospital Stay: Payer: Medicare Other | Attending: Oncology

## 2020-09-04 ENCOUNTER — Encounter: Payer: Self-pay | Admitting: Oncology

## 2020-09-04 ENCOUNTER — Inpatient Hospital Stay (HOSPITAL_BASED_OUTPATIENT_CLINIC_OR_DEPARTMENT_OTHER): Payer: Medicare Other | Admitting: Oncology

## 2020-09-04 ENCOUNTER — Inpatient Hospital Stay: Payer: Medicare Other

## 2020-09-04 VITALS — BP 117/74 | HR 85 | Temp 99.6°F | Resp 18

## 2020-09-04 VITALS — BP 125/73 | HR 70 | Temp 97.0°F | Resp 18 | Wt 177.4 lb

## 2020-09-04 DIAGNOSIS — Z5112 Encounter for antineoplastic immunotherapy: Secondary | ICD-10-CM | POA: Diagnosis present

## 2020-09-04 DIAGNOSIS — C3411 Malignant neoplasm of upper lobe, right bronchus or lung: Secondary | ICD-10-CM | POA: Insufficient documentation

## 2020-09-04 DIAGNOSIS — E860 Dehydration: Secondary | ICD-10-CM | POA: Diagnosis not present

## 2020-09-04 DIAGNOSIS — Z8551 Personal history of malignant neoplasm of bladder: Secondary | ICD-10-CM | POA: Insufficient documentation

## 2020-09-04 DIAGNOSIS — I10 Essential (primary) hypertension: Secondary | ICD-10-CM | POA: Diagnosis not present

## 2020-09-04 DIAGNOSIS — Z87891 Personal history of nicotine dependence: Secondary | ICD-10-CM | POA: Insufficient documentation

## 2020-09-04 DIAGNOSIS — Z79899 Other long term (current) drug therapy: Secondary | ICD-10-CM | POA: Diagnosis not present

## 2020-09-04 DIAGNOSIS — R197 Diarrhea, unspecified: Secondary | ICD-10-CM | POA: Insufficient documentation

## 2020-09-04 DIAGNOSIS — E876 Hypokalemia: Secondary | ICD-10-CM | POA: Diagnosis not present

## 2020-09-04 DIAGNOSIS — C3401 Malignant neoplasm of right main bronchus: Secondary | ICD-10-CM

## 2020-09-04 DIAGNOSIS — Z8616 Personal history of COVID-19: Secondary | ICD-10-CM | POA: Diagnosis not present

## 2020-09-04 LAB — CBC WITH DIFFERENTIAL/PLATELET
Abs Immature Granulocytes: 0.01 10*3/uL (ref 0.00–0.07)
Basophils Absolute: 0 10*3/uL (ref 0.0–0.1)
Basophils Relative: 0 %
Eosinophils Absolute: 0.1 10*3/uL (ref 0.0–0.5)
Eosinophils Relative: 2 %
HCT: 32.5 % — ABNORMAL LOW (ref 39.0–52.0)
Hemoglobin: 10.7 g/dL — ABNORMAL LOW (ref 13.0–17.0)
Immature Granulocytes: 0 %
Lymphocytes Relative: 20 %
Lymphs Abs: 1 10*3/uL (ref 0.7–4.0)
MCH: 31.9 pg (ref 26.0–34.0)
MCHC: 32.9 g/dL (ref 30.0–36.0)
MCV: 97 fL (ref 80.0–100.0)
Monocytes Absolute: 0.7 10*3/uL (ref 0.1–1.0)
Monocytes Relative: 14 %
Neutro Abs: 3.2 10*3/uL (ref 1.7–7.7)
Neutrophils Relative %: 64 %
Platelets: 172 10*3/uL (ref 150–400)
RBC: 3.35 MIL/uL — ABNORMAL LOW (ref 4.22–5.81)
RDW: 12.9 % (ref 11.5–15.5)
WBC: 5 10*3/uL (ref 4.0–10.5)
nRBC: 0 % (ref 0.0–0.2)

## 2020-09-04 LAB — COMPREHENSIVE METABOLIC PANEL
ALT: 20 U/L (ref 0–44)
AST: 28 U/L (ref 15–41)
Albumin: 3 g/dL — ABNORMAL LOW (ref 3.5–5.0)
Alkaline Phosphatase: 50 U/L (ref 38–126)
Anion gap: 11 (ref 5–15)
BUN: 24 mg/dL — ABNORMAL HIGH (ref 8–23)
CO2: 21 mmol/L — ABNORMAL LOW (ref 22–32)
Calcium: 8.4 mg/dL — ABNORMAL LOW (ref 8.9–10.3)
Chloride: 107 mmol/L (ref 98–111)
Creatinine, Ser: 1.85 mg/dL — ABNORMAL HIGH (ref 0.61–1.24)
GFR, Estimated: 39 mL/min — ABNORMAL LOW (ref >60)
Glucose, Bld: 173 mg/dL — ABNORMAL HIGH (ref 70–99)
Potassium: 3.3 mmol/L — ABNORMAL LOW (ref 3.5–5.1)
Sodium: 139 mmol/L (ref 135–145)
Total Bilirubin: 0.3 mg/dL (ref 0.3–1.2)
Total Protein: 6.4 g/dL — ABNORMAL LOW (ref 6.5–8.1)

## 2020-09-04 LAB — TSH: TSH: 0.832 u[IU]/mL (ref 0.350–4.500)

## 2020-09-04 MED ORDER — SODIUM CHLORIDE 0.9% FLUSH
10.0000 mL | INTRAVENOUS | Status: DC | PRN
  Administered 2020-09-04: 10 mL via INTRAVENOUS
  Filled 2020-09-04: qty 10

## 2020-09-04 MED ORDER — SODIUM CHLORIDE 0.9 % IV SOLN
Freq: Once | INTRAVENOUS | Status: AC
  Filled 2020-09-04: qty 250

## 2020-09-04 MED ORDER — HEPARIN SOD (PORK) LOCK FLUSH 100 UNIT/ML IV SOLN
500.0000 [IU] | Freq: Once | INTRAVENOUS | Status: DC
  Filled 2020-09-04: qty 5

## 2020-09-04 MED ORDER — HEPARIN SOD (PORK) LOCK FLUSH 100 UNIT/ML IV SOLN
500.0000 [IU] | Freq: Once | INTRAVENOUS | Status: AC | PRN
  Administered 2020-09-04: 500 [IU]
  Filled 2020-09-04: qty 5

## 2020-09-04 MED ORDER — SODIUM CHLORIDE 0.9 % IV SOLN
10.0000 mg/kg | Freq: Once | INTRAVENOUS | Status: AC
  Administered 2020-09-04: 740 mg via INTRAVENOUS
  Filled 2020-09-04: qty 10

## 2020-09-04 NOTE — Progress Notes (Signed)
MD Janese Banks notified for Cr of 1.54 slightly out of parameter. Per MD ok to proceed with treatment .

## 2020-09-05 LAB — T4: T4, Total: 6.6 ug/dL (ref 4.5–12.0)

## 2020-09-05 NOTE — Progress Notes (Signed)
Hematology/Oncology Consult note [facility]  Telephone:([phone] Fax:([phone]  Patient Care Team: Perrin Maltese, MD as PCP - General (Internal Medicine) Telford Nab, RN as Oncology Nurse Navigator Sindy Guadeloupe, MD as Consulting Physician (Hematology and Oncology)   Name of the patient: Martin Jennings  [id number]  1950-12-11   Date of visit: 09/05/20  Diagnosis- stage IIIc adenocarcinoma of the lung cT3 cN3 M0   Chief complaint/ Reason for visit-on treatment assessment prior to cycle 3 of maintenance durvalumab  Heme/Onc history: Patient is a 70 year old male with a history of tobacco dependence who was admitted to the hospital for symptoms of acute onset shortness of breath and was found to have Covid pneumonia. He underwent CT angio chest which showed extensive mediastinal as well as bilateral hilar adenopathy. Masslike opacity with architectural distortion involving the right upper lobe measuring 5.7 x 2.9 x 2.3 cm and groundglass opacities bilaterally. Mass involving the left adrenal gland as well as a smaller right adrenal gland possibly indicating adrenal metastases.  This was followed by a PET CT scan 2 weeks later which showed the consolidative process in the right upper lobesuv7.88 concerning for infiltrating tumor. It appears to involve the pleura associated interstitial thickening was also seen. Bulky right hilar and mediastinal adenopathy. Enlarged somewhat necrotic appearing left supraclavicular lymph node was also hypermetabolic. Adrenal gland was not hypermetabolic and was consistent with a benign adenoma. No metastatic disease involving liver. No features of distant metastatic disease.  Pathology showed metastatic non-small cell lung cancer which was TTF-1 positive consistent with adenocarcinoma  Interval history-patient reports doing well. He has baseline fatigue but does not report significant shortness of breath. He  is not using any home oxygen. No further hemoptysis.  ECOG PS- 1 Pain scale- 0   Review of systems- Review of Systems  Constitutional: Positive for malaise/fatigue. Negative for chills, fever and weight loss.  HENT: Negative for congestion, ear discharge and nosebleeds.   Eyes: Negative for blurred vision.  Respiratory: Negative for cough, hemoptysis, sputum production, shortness of breath and wheezing.   Cardiovascular: Negative for chest pain, palpitations, orthopnea and claudication.  Gastrointestinal: Negative for abdominal pain, blood in stool, constipation, diarrhea, heartburn, melena, nausea and vomiting.  Genitourinary: Negative for dysuria, flank pain, frequency, hematuria and urgency.  Musculoskeletal: Negative for back pain, joint pain and myalgias.  Skin: Negative for rash.  Neurological: Negative for dizziness, tingling, focal weakness, seizures, weakness and headaches.  Endo/Heme/Allergies: Does not bruise/bleed easily.  Psychiatric/Behavioral: Negative for depression and suicidal ideas. The patient does not have insomnia.       No Known Allergies   Past Medical History:  Diagnosis Date  . Bladder cancer ([place]) 2012  . COVID-19 virus infection 04/03/2020  . Hypertension   . Lung cancer ([place])   . Peripheral vascular disease [facility])      Past Surgical History:  Procedure Laterality Date  . BLADDER REMOVAL    . LOWER EXTREMITY ANGIOGRAPHY Left 04/12/2017   Procedure: Lower Extremity Angiography;  Surgeon: Katha Cabal, MD;  Location: [place] [facility];  Service: Cardiovascular;  Laterality: Left;  . PORTA CATH INSERTION N/A 05/22/2020   Procedure: PORTA CATH INSERTION;  Surgeon: Algernon Huxley, MD;  Location: [place] [facility];  Service: Cardiovascular;  Laterality: N/A;  . uretostomy    . VASCULAR SURGERY      Social History   Socioeconomic History  . Marital status: Married    Spouse name: Not on  file  . Number of children: Not on file  . Years of  education: Not on file  . Highest education level: Not on file  Occupational History  . Not on file  Tobacco Use  . Smoking status: Former Smoker    Packs/day: 1.25    Years: 50.00    Pack years: 62.50    Types: Cigarettes    Quit date: 03/13/2020    Years since quitting: 0.4  . Smokeless tobacco: Never Used  Vaping Use  . Vaping Use: Never used  Substance and Sexual Activity  . Alcohol use: No  . Drug use: Not Currently    Types: Marijuana  . Sexual activity: Not Currently  Other Topics Concern  . Not on file  Social History Narrative  . Not on file   Social Determinants of Health   Financial Resource Strain: Not on file  Food Insecurity: Not on file  Transportation Needs: Not on file  Physical Activity: Not on file  Stress: Not on file  Social Connections: Not on file  Intimate Partner Violence: Not on file    No family history on file.   Current Outpatient Medications:  .  amLODipine (NORVASC) 10 MG tablet, Take 10 mg by mouth daily. , Disp: , Rfl:  .  atorvastatin (LIPITOR) 20 MG tablet, Take 20 mg by mouth every evening. , Disp: , Rfl:  .  carvedilol (COREG) 6.25 MG tablet, Take 6.25 mg by mouth 2 (two) times daily., Disp: , Rfl:  .  clopidogrel (PLAVIX) 75 MG tablet, Take 75 mg by mouth daily. , Disp: , Rfl:  .  potassium chloride SA (KLOR-CON) 20 MEQ tablet, Take 1 tablet (20 mEq total) by mouth daily., Disp: 14 tablet, Rfl: 0 .  oxyCODONE (OXY IR/ROXICODONE) 5 MG immediate release tablet, TAKE 1 TABLET BY MOUTH EVERY 8 HOURS AS NEEDED FOR SEVERE PAIN (Patient not taking: Reported on 09/04/2020), Disp: 30 tablet, Rfl: 0  Physical exam:  Vitals:   09/04/20 1036  BP: 125/73  Pulse: 70  Resp: 18  Temp: (!) 97 F (36.1 C)  TempSrc: Tympanic  SpO2: 95%  Weight: 177 lb 6.4 oz (80.5 kg)   Physical Exam Constitutional:      General: He is not in acute distress. Eyes:     Extraocular Movements: EOM normal.  Cardiovascular:     Rate and Rhythm: Normal rate  and regular rhythm.     Heart sounds: Normal heart sounds.  Pulmonary:     Effort: Pulmonary effort is normal.     Breath sounds: Normal breath sounds.  Abdominal:     General: Bowel sounds are normal.     Palpations: Abdomen is soft.  Skin:    General: Skin is warm and dry.  Neurological:     Mental Status: He is alert and oriented to person, place, and time.      CMP Latest Ref Rng & Units 09/04/2020  Glucose 70 - 99 mg/dL 173(H)  BUN 8 - 23 mg/dL 24(H)  Creatinine 0.61 - 1.24 mg/dL 1.85(H)  Sodium 135 - 145 mmol/L 139  Potassium 3.5 - 5.1 mmol/L 3.3(L)  Chloride 98 - 111 mmol/L 107  CO2 22 - 32 mmol/L 21(L)  Calcium 8.9 - 10.3 mg/dL 8.4(L)  Total Protein 6.5 - 8.1 g/dL 6.4(L)  Total Bilirubin 0.3 - 1.2 mg/dL 0.3  Alkaline Phos 38 - 126 U/L 50  AST 15 - 41 U/L 28  ALT 0 - 44 U/L 20   CBC Latest  Ref Rng & Units 09/04/2020  WBC 4.0 - 10.5 K/uL 5.0  Hemoglobin 13.0 - 17.0 g/dL 10.7(L)  Hematocrit 39.0 - 52.0 % 32.5(L)  Platelets 150 - 400 K/uL 172      Assessment and plan- Patient is a 70 y.o. male withstage IIIc adenocarcinoma of the right upper lobe of the lung cT4 N3 M0.  He is here for on treatment assessment prior to cycle 3 of maintenance durvalumab  Counts are okay to proceed with cycle 3 of maintenance durvalumab today.  I will see him back in 2 weeks for cycle 4.  Plan is to continue treatment for 1 year.  Patient has baseline CKD and his creatinine has remained stable around 1.5.He will need a repeat CT chest abdomen and pelvis without contrast in March 2021.   Visit Diagnosis 1. Encounter for antineoplastic immunotherapy   2. Malignant neoplasm of upper lobe of right lung [facility])      Dr. Randa Evens, MD, MPH [facility] at [facility] [phone] 09/05/2020 10:07 AM

## 2020-09-08 ENCOUNTER — Telehealth: Payer: Self-pay | Admitting: *Deleted

## 2020-09-08 ENCOUNTER — Telehealth: Payer: Self-pay | Admitting: Oncology

## 2020-09-08 DIAGNOSIS — Z5189 Encounter for other specified aftercare: Secondary | ICD-10-CM

## 2020-09-08 DIAGNOSIS — R112 Nausea with vomiting, unspecified: Secondary | ICD-10-CM

## 2020-09-08 DIAGNOSIS — R197 Diarrhea, unspecified: Secondary | ICD-10-CM

## 2020-09-08 NOTE — Telephone Encounter (Signed)
Patient advised to take his antiemetics medicine and that scheduler will be in contact with him regarding an appointment for tomorrow morning He was in agreement with this

## 2020-09-08 NOTE — Telephone Encounter (Signed)
Per MD--scheduled patient to come in tomorrow on 2/15 for labs/MD/poss fluids. Patient confirmed date and time.

## 2020-09-08 NOTE — Telephone Encounter (Signed)
Schedule message sent for appointment tomorrow to scheduling team, orders entered in computer for labs

## 2020-09-08 NOTE — Telephone Encounter (Signed)
Patient called reporting that he has been having abdominal pain and has had diarrhea and vomiting for past 3 days. He is having light brown watery diarrhea 5 - 6 times a day and he vomits about an hour  After he eats. He states that he is able to keep water down. He is not running a fever and has not been exposed to anyone with covid. He reports that he has a tinny taste in his mouth as well. He has not taken any medicine for nausea. Please advise

## 2020-09-08 NOTE — Telephone Encounter (Signed)
Spoke with pt to notify him of appt tomorrow 2/15. Patient confirmed.

## 2020-09-08 NOTE — Telephone Encounter (Signed)
Please ask him to take his prn nausea medications.jenny- I can see him tomorrow morning at 9 am with labs cbc and cmp for possible fluids

## 2020-09-09 ENCOUNTER — Inpatient Hospital Stay (HOSPITAL_BASED_OUTPATIENT_CLINIC_OR_DEPARTMENT_OTHER): Payer: Medicare Other | Admitting: Oncology

## 2020-09-09 ENCOUNTER — Inpatient Hospital Stay: Payer: Medicare Other

## 2020-09-09 ENCOUNTER — Encounter: Payer: Self-pay | Admitting: Oncology

## 2020-09-09 VITALS — BP 148/79 | HR 71 | Temp 98.1°F | Resp 18

## 2020-09-09 VITALS — BP 132/72 | HR 92 | Temp 99.3°F | Resp 18 | Wt 170.2 lb

## 2020-09-09 DIAGNOSIS — R112 Nausea with vomiting, unspecified: Secondary | ICD-10-CM

## 2020-09-09 DIAGNOSIS — R197 Diarrhea, unspecified: Secondary | ICD-10-CM

## 2020-09-09 DIAGNOSIS — E876 Hypokalemia: Secondary | ICD-10-CM

## 2020-09-09 DIAGNOSIS — Z5112 Encounter for antineoplastic immunotherapy: Secondary | ICD-10-CM | POA: Diagnosis not present

## 2020-09-09 DIAGNOSIS — Z5189 Encounter for other specified aftercare: Secondary | ICD-10-CM

## 2020-09-09 DIAGNOSIS — E860 Dehydration: Secondary | ICD-10-CM

## 2020-09-09 LAB — C DIFFICILE QUICK SCREEN W PCR REFLEX
C Diff antigen: NEGATIVE
C Diff interpretation: NOT DETECTED
C Diff toxin: NEGATIVE

## 2020-09-09 LAB — CBC WITH DIFFERENTIAL/PLATELET
Abs Immature Granulocytes: 0.02 10*3/uL (ref 0.00–0.07)
Basophils Absolute: 0 10*3/uL (ref 0.0–0.1)
Basophils Relative: 0 %
Eosinophils Absolute: 0.1 10*3/uL (ref 0.0–0.5)
Eosinophils Relative: 2 %
HCT: 34.4 % — ABNORMAL LOW (ref 39.0–52.0)
Hemoglobin: 11.7 g/dL — ABNORMAL LOW (ref 13.0–17.0)
Immature Granulocytes: 0 %
Lymphocytes Relative: 21 %
Lymphs Abs: 1 10*3/uL (ref 0.7–4.0)
MCH: 32.1 pg (ref 26.0–34.0)
MCHC: 34 g/dL (ref 30.0–36.0)
MCV: 94.2 fL (ref 80.0–100.0)
Monocytes Absolute: 0.8 10*3/uL (ref 0.1–1.0)
Monocytes Relative: 17 %
Neutro Abs: 2.9 10*3/uL (ref 1.7–7.7)
Neutrophils Relative %: 60 %
Platelets: 199 10*3/uL (ref 150–400)
RBC: 3.65 MIL/uL — ABNORMAL LOW (ref 4.22–5.81)
RDW: 12.3 % (ref 11.5–15.5)
WBC: 4.8 10*3/uL (ref 4.0–10.5)
nRBC: 0 % (ref 0.0–0.2)

## 2020-09-09 LAB — COMPREHENSIVE METABOLIC PANEL
ALT: 14 U/L (ref 0–44)
AST: 16 U/L (ref 15–41)
Albumin: 3.2 g/dL — ABNORMAL LOW (ref 3.5–5.0)
Alkaline Phosphatase: 47 U/L (ref 38–126)
Anion gap: 10 (ref 5–15)
BUN: 18 mg/dL (ref 8–23)
CO2: 23 mmol/L (ref 22–32)
Calcium: 8.5 mg/dL — ABNORMAL LOW (ref 8.9–10.3)
Chloride: 107 mmol/L (ref 98–111)
Creatinine, Ser: 1.72 mg/dL — ABNORMAL HIGH (ref 0.61–1.24)
GFR, Estimated: 42 mL/min — ABNORMAL LOW (ref >60)
Glucose, Bld: 122 mg/dL — ABNORMAL HIGH (ref 70–99)
Potassium: 3.3 mmol/L — ABNORMAL LOW (ref 3.5–5.1)
Sodium: 140 mmol/L (ref 135–145)
Total Bilirubin: 0.6 mg/dL (ref 0.3–1.2)
Total Protein: 6.8 g/dL (ref 6.5–8.1)

## 2020-09-09 MED ORDER — HEPARIN SOD (PORK) LOCK FLUSH 100 UNIT/ML IV SOLN
500.0000 [IU] | Freq: Once | INTRAVENOUS | Status: AC
  Administered 2020-09-09: 500 [IU] via INTRAVENOUS
  Filled 2020-09-09: qty 5

## 2020-09-09 MED ORDER — SODIUM CHLORIDE 0.9% FLUSH
10.0000 mL | INTRAVENOUS | Status: DC | PRN
  Administered 2020-09-09: 10 mL via INTRAVENOUS
  Filled 2020-09-09: qty 10

## 2020-09-09 MED ORDER — DICYCLOMINE HCL 10 MG PO CAPS: 10.0000 mg | ORAL_CAPSULE | Freq: Three times a day (TID) | ORAL | 0 refills | Status: DC

## 2020-09-09 MED ORDER — POTASSIUM CHLORIDE IN NACL 20-0.9 MEQ/L-% IV SOLN
INTRAVENOUS | Status: DC
  Filled 2020-09-09 (×2): qty 1000

## 2020-09-09 MED ORDER — MAGIC MOUTHWASH: 5.0000 mL | Freq: Four times a day (QID) | ORAL | 0 refills | Status: DC | PRN

## 2020-09-09 NOTE — Progress Notes (Signed)
Pt received IVF hydration with addition of potassium. Tolerated well. Pt was able to provide a stool sample prior to discharge. Left clinic VSS.

## 2020-09-09 NOTE — Addendum Note (Signed)
Addended by: Randa Evens C on: 09/09/2020 11:52 AM   Modules accepted: Orders

## 2020-09-09 NOTE — Progress Notes (Addendum)
Hematology/Oncology Consult note [facility]  Telephone:([phone] [phone]  Patient Care Team: Perrin Maltese, MD as PCP - General (Internal Medicine) Telford Nab, RN as Oncology Nurse Navigator Sindy Guadeloupe, MD as Consulting Physician (Hematology and Oncology)   Name of the patient: Martin Jennings  [id number]  1951/04/20   Date of visit: 09/09/20  Diagnosis- stage IIIc adenocarcinoma of the lung cT3 cN3 M0  Chief complaint/ Reason for visit-acute visit for ongoing abdominal pain and diarrhea  Heme/Onc history: Patient is a 70 year old male with a history of tobacco dependence who was admitted to the hospital for symptoms of acute onset shortness of breath and was found to have Covid pneumonia. He underwent CT angio chest which showed extensive mediastinal as well as bilateral hilar adenopathy. Masslike opacity with architectural distortion involving the right upper lobe measuring 5.7 x 2.9 x 2.3 cm and groundglass opacities bilaterally. Mass involving the left adrenal gland as well as a smaller right adrenal gland possibly indicating adrenal metastases.  This was followed by a PET CT scan 2 weeks later which showed the consolidative process in the right upper lobesuv7.88 concerning for infiltrating tumor. It appears to involve the pleura associated interstitial thickening was also seen. Bulky right hilar and mediastinal adenopathy. Enlarged somewhat necrotic appearing left supraclavicular lymph node was also hypermetabolic. Adrenal gland was not hypermetabolic and was consistent with a benign adenoma. No metastatic disease involving liver. No features of distant metastatic disease.  Pathology showed metastatic non-small cell lung cancer which was TTF-1 positive consistent with adenocarcinoma  Interval history-patient reports that he has had increasing fatigue over the last 3 days.  Has intermittent episodes of small-volume diarrhea  and crampy abdominal pain.  Feels like his throat is dry.  Denies any blood in his stool.  Denies any fever.  Denies any sick contacts.  Denies any outside food that could have triggered the event.  ECOG PS- 2 Pain scale- 2 Opioid associated constipation- no  Review of systems- Review of Systems  Constitutional: Positive for malaise/fatigue. Negative for chills, fever and weight loss.  HENT: Negative for congestion, ear discharge and nosebleeds.   Eyes: Negative for blurred vision.  Respiratory: Negative for cough, hemoptysis, sputum production, shortness of breath and wheezing.   Cardiovascular: Negative for chest pain, palpitations, orthopnea and claudication.  Gastrointestinal: Positive for abdominal pain and diarrhea. Negative for blood in stool, constipation, heartburn, melena, nausea and vomiting.  Genitourinary: Negative for dysuria, flank pain, frequency, hematuria and urgency.  Musculoskeletal: Negative for back pain, joint pain and myalgias.  Skin: Negative for rash.  Neurological: Negative for dizziness, tingling, focal weakness, seizures, weakness and headaches.  Endo/Heme/Allergies: Does not bruise/bleed easily.  Psychiatric/Behavioral: Negative for depression and suicidal ideas. The patient does not have insomnia.     No Known Allergies   Past Medical History:  Diagnosis Date  . Bladder cancer ([place]) 2012  . COVID-19 virus infection 04/03/2020  . Hypertension   . Lung cancer ([place])   . Peripheral vascular disease [facility])      Past Surgical History:  Procedure Laterality Date  . BLADDER REMOVAL    . LOWER EXTREMITY ANGIOGRAPHY Left 04/12/2017   Procedure: Lower Extremity Angiography;  Surgeon: Katha Cabal, MD;  Location: [place] [facility];  Service: Cardiovascular;  Laterality: Left;  . PORTA CATH INSERTION N/A 05/22/2020   Procedure: PORTA CATH INSERTION;  Surgeon: Algernon Huxley, MD;  Location: [place] [facility];  Service: Cardiovascular;  Laterality:  N/A;   . uretostomy    . VASCULAR SURGERY      Social History   Socioeconomic History  . Marital status: Married    Spouse name: Not on file  . Number of children: Not on file  . Years of education: Not on file  . Highest education level: Not on file  Occupational History  . Not on file  Tobacco Use  . Smoking status: Former Smoker    Packs/day: 1.25    Years: 50.00    Pack years: 62.50    Types: Cigarettes    Quit date: 03/13/2020    Years since quitting: 0.4  . Smokeless tobacco: Never Used  Vaping Use  . Vaping Use: Never used  Substance and Sexual Activity  . Alcohol use: No  . Drug use: Not Currently    Types: Marijuana  . Sexual activity: Not Currently  Other Topics Concern  . Not on file  Social History Narrative  . Not on file   Social Determinants of Health   Financial Resource Strain: Not on file  Food Insecurity: Not on file  Transportation Needs: Not on file  Physical Activity: Not on file  Stress: Not on file  Social Connections: Not on file  Intimate Partner Violence: Not on file    No family history on file.   Current Outpatient Medications:  .  amLODipine (NORVASC) 10 MG tablet, Take 10 mg by mouth daily. , Disp: , Rfl:  .  atorvastatin (LIPITOR) 20 MG tablet, Take 20 mg by mouth every evening. , Disp: , Rfl:  .  carvedilol (COREG) 6.25 MG tablet, Take 6.25 mg by mouth 2 (two) times daily., Disp: , Rfl:  .  clopidogrel (PLAVIX) 75 MG tablet, Take 75 mg by mouth daily. , Disp: , Rfl:  .  dicyclomine (BENTYL) 10 MG capsule, Take 1 capsule (10 mg total) by mouth 4 (four) times daily -  before meals and at bedtime., Disp: 90 capsule, Rfl: 0 .  magic mouthwash SOLN, Take 5 mLs by mouth 4 (four) times daily as needed for mouth pain., Disp: 240 mL, Rfl: 0 .  potassium chloride SA (KLOR-CON) 20 MEQ tablet, Take 1 tablet (20 mEq total) by mouth daily., Disp: 14 tablet, Rfl: 0 .  oxyCODONE (OXY IR/ROXICODONE) 5 MG immediate release tablet, TAKE 1 TABLET BY  MOUTH EVERY 8 HOURS AS NEEDED FOR SEVERE PAIN (Patient not taking: No sig reported), Disp: 30 tablet, Rfl: 0 No current facility-administered medications for this visit.  Facility-Administered Medications Ordered in Other Visits:  .  0.9 % NaCl with KCl 20 mEq/ L  infusion, , Intravenous, Continuous, Sindy Guadeloupe, MD, Last Rate: 999 mL/hr at 09/09/20 0941, New Bag at 09/09/20 0941 .  heparin lock flush 100 unit/mL, 500 Units, Intravenous, Once, Sindy Guadeloupe, MD .  sodium chloride flush (NS) 0.9 % injection 10 mL, 10 mL, Intravenous, PRN, Sindy Guadeloupe, MD, 10 mL at 09/09/20 0852  Physical exam:  Vitals:   09/09/20 0913  BP: 132/72  Pulse: 92  Resp: 18  Temp: 99.3 F (37.4 C)  TempSrc: Tympanic  SpO2: 95%  Weight: 170 lb 3.2 oz (77.2 kg)   Physical Exam Constitutional:      General: He is not in acute distress.    Comments: Appears mildly fatigued  HENT:     Mouth/Throat:     Mouth: Mucous membranes are moist.     Pharynx: Oropharynx is clear.  Eyes:     Extraocular  Movements: EOM normal.  Cardiovascular:     Rate and Rhythm: Normal rate and regular rhythm.     Heart sounds: Normal heart sounds.  Pulmonary:     Effort: Pulmonary effort is normal.     Breath sounds: Normal breath sounds.  Abdominal:     General: Bowel sounds are normal.     Palpations: Abdomen is soft.     Comments: Neobladder in place. Mild diffuse TTP  Musculoskeletal:     Cervical back: Normal range of motion.  Skin:    General: Skin is warm and dry.  Neurological:     Mental Status: He is alert and oriented to person, place, and time.      CMP Latest Ref Rng & Units 09/09/2020  Glucose 70 - 99 mg/dL 122(H)  BUN 8 - 23 mg/dL 18  Creatinine 0.61 - 1.24 mg/dL 1.72(H)  Sodium 135 - 145 mmol/L 140  Potassium 3.5 - 5.1 mmol/L 3.3(L)  Chloride 98 - 111 mmol/L 107  CO2 22 - 32 mmol/L 23  Calcium 8.9 - 10.3 mg/dL 8.5(L)  Total Protein 6.5 - 8.1 g/dL 6.8  Total Bilirubin 0.3 - 1.2 mg/dL 0.6   Alkaline Phos 38 - 126 U/L 47  AST 15 - 41 U/L 16  ALT 0 - 44 U/L 14   CBC Latest Ref Rng & Units 09/09/2020  WBC 4.0 - 10.5 K/uL 4.8  Hemoglobin 13.0 - 17.0 g/dL 11.7(L)  Hematocrit 39.0 - 52.0 % 34.4(L)  Platelets 150 - 400 K/uL 199      Assessment and plan- Patient is a 70 y.o. male  withstage IIIc adenocarcinoma of the right upper lobe of the lung cT4 N3 M0.    He is here for an acute visit of ongoing abdominal pain and diarrhea  Patient reports fatigue, low volume diarrhea and cramping abdominal pain for the last 3 days.  No clear provoking factors.  No recent antibiotic use.  He also has mildly low potassium of 3.3 today.  I will give him 1 L of IV fluids with 20 mEq of potassium.  We will send a prescription for Bentyl as needed for abdominal cramping.  I would like to collect a stool specimen to rule out C. difficile if patient can give a sample today.  I have asked him to take as needed Imodium for his diarrhea in the meanwhile.  We will check on how he is doing tomorrow and day after.  He has an appointment with me on 09/18/2020 which he will keep   Visit Diagnosis 1. Hypokalemia   2. Dehydration   3. Diarrhea, unspecified type      Dr. Randa Evens, MD, MPH [facility] at [facility] [phone] 09/09/2020 10:01 AM

## 2020-09-11 ENCOUNTER — Telehealth: Payer: Self-pay | Admitting: *Deleted

## 2020-09-11 NOTE — Telephone Encounter (Signed)
Called pt to see how he was doing since earlier in the week when he was dehydrated and having diarrhea. He states that he has been eating applesauce, bananas and rice and taking the imodium. His stools have got thicker and no more diarrhea stools. He is feeling better and drinking liquids. He does not think he needs anything from the clinic right now.

## 2020-09-18 ENCOUNTER — Inpatient Hospital Stay: Payer: Medicare Other

## 2020-09-18 ENCOUNTER — Encounter: Payer: Self-pay | Admitting: Oncology

## 2020-09-18 ENCOUNTER — Inpatient Hospital Stay (HOSPITAL_BASED_OUTPATIENT_CLINIC_OR_DEPARTMENT_OTHER): Payer: Medicare Other | Admitting: Oncology

## 2020-09-18 VITALS — BP 137/82 | HR 80 | Temp 96.9°F | Resp 18 | Wt 177.3 lb

## 2020-09-18 DIAGNOSIS — C3401 Malignant neoplasm of right main bronchus: Secondary | ICD-10-CM

## 2020-09-18 DIAGNOSIS — Z5112 Encounter for antineoplastic immunotherapy: Secondary | ICD-10-CM | POA: Diagnosis not present

## 2020-09-18 DIAGNOSIS — C3411 Malignant neoplasm of upper lobe, right bronchus or lung: Secondary | ICD-10-CM

## 2020-09-18 LAB — CBC WITH DIFFERENTIAL/PLATELET
Abs Immature Granulocytes: 0.03 10*3/uL (ref 0.00–0.07)
Basophils Absolute: 0 10*3/uL (ref 0.0–0.1)
Basophils Relative: 0 %
Eosinophils Absolute: 0.2 10*3/uL (ref 0.0–0.5)
Eosinophils Relative: 3 %
HCT: 35.1 % — ABNORMAL LOW (ref 39.0–52.0)
Hemoglobin: 11.3 g/dL — ABNORMAL LOW (ref 13.0–17.0)
Immature Granulocytes: 1 %
Lymphocytes Relative: 20 %
Lymphs Abs: 1.2 10*3/uL (ref 0.7–4.0)
MCH: 31.3 pg (ref 26.0–34.0)
MCHC: 32.2 g/dL (ref 30.0–36.0)
MCV: 97.2 fL (ref 80.0–100.0)
Monocytes Absolute: 0.5 10*3/uL (ref 0.1–1.0)
Monocytes Relative: 9 %
Neutro Abs: 3.8 10*3/uL (ref 1.7–7.7)
Neutrophils Relative %: 67 %
Platelets: 266 10*3/uL (ref 150–400)
RBC: 3.61 MIL/uL — ABNORMAL LOW (ref 4.22–5.81)
RDW: 12.4 % (ref 11.5–15.5)
WBC: 5.7 10*3/uL (ref 4.0–10.5)
nRBC: 0 % (ref 0.0–0.2)

## 2020-09-18 LAB — COMPREHENSIVE METABOLIC PANEL
ALT: 19 U/L (ref 0–44)
AST: 22 U/L (ref 15–41)
Albumin: 3 g/dL — ABNORMAL LOW (ref 3.5–5.0)
Alkaline Phosphatase: 45 U/L (ref 38–126)
Anion gap: 9 (ref 5–15)
BUN: 23 mg/dL (ref 8–23)
CO2: 24 mmol/L (ref 22–32)
Calcium: 8.5 mg/dL — ABNORMAL LOW (ref 8.9–10.3)
Chloride: 109 mmol/L (ref 98–111)
Creatinine, Ser: 1.61 mg/dL — ABNORMAL HIGH (ref 0.61–1.24)
GFR, Estimated: 46 mL/min — ABNORMAL LOW (ref >60)
Glucose, Bld: 115 mg/dL — ABNORMAL HIGH (ref 70–99)
Potassium: 3.4 mmol/L — ABNORMAL LOW (ref 3.5–5.1)
Sodium: 142 mmol/L (ref 135–145)
Total Bilirubin: 0.6 mg/dL (ref 0.3–1.2)
Total Protein: 6.5 g/dL (ref 6.5–8.1)

## 2020-09-18 MED ORDER — SODIUM CHLORIDE 0.9% FLUSH
10.0000 mL | INTRAVENOUS | Status: AC | PRN
  Administered 2020-09-18: 10 mL via INTRAVENOUS
  Filled 2020-09-18: qty 10

## 2020-09-18 MED ORDER — HEPARIN SOD (PORK) LOCK FLUSH 100 UNIT/ML IV SOLN
500.0000 [IU] | Freq: Once | INTRAVENOUS | Status: AC
  Administered 2020-09-18: 500 [IU] via INTRAVENOUS
  Filled 2020-09-18: qty 5

## 2020-09-18 MED ORDER — SODIUM CHLORIDE 0.9 % IV SOLN
Freq: Once | INTRAVENOUS | Status: AC
  Filled 2020-09-18: qty 250

## 2020-09-18 MED ORDER — HEPARIN SOD (PORK) LOCK FLUSH 100 UNIT/ML IV SOLN
INTRAVENOUS | Status: AC
  Filled 2020-09-18: qty 5

## 2020-09-18 MED ORDER — SODIUM CHLORIDE 0.9 % IV SOLN
10.0000 mg/kg | Freq: Once | INTRAVENOUS | Status: AC
  Administered 2020-09-18: 740 mg via INTRAVENOUS
  Filled 2020-09-18: qty 10

## 2020-09-18 NOTE — Progress Notes (Unsigned)
Creatinine: 1.61. MD, Dr. Janese Banks, notified and aware. Per MD order: proceed with scheduled Imfinzi treatment today.

## 2020-09-18 NOTE — Progress Notes (Signed)
Hematology/Oncology Consult note [facility]  [phone] [phone]  Patient Care Team: Perrin Maltese, MD as PCP - General (Internal Medicine) Telford Nab, RN as Oncology Nurse Navigator Sindy Guadeloupe, MD as Consulting Physician (Hematology and Oncology)   Name of the patient: Martin Jennings  [id number]  [birth year]   Date of visit: 09/18/20  Diagnosis- stage IIIc adenocarcinoma of the lung cT3 cN3 M0  Chief complaint/ Reason for visit- on treatment assessment prior to cycle 4 of maintenance durvalumab  Heme/Onc history: Patient is a 70 year old male with a history of tobacco dependence who was admitted to the hospital for symptoms of acute onset shortness of breath and was found to have Covid pneumonia. He underwent CT angio chest which showed extensive mediastinal as well as bilateral hilar adenopathy. Masslike opacity with architectural distortion involving the right upper lobe measuring 5.7 x 2.9 x 2.3 cm and groundglass opacities bilaterally. Mass involving the left adrenal gland as well as a smaller right adrenal gland possibly indicating adrenal metastases.  This was followed by a PET CT scan 2 weeks later which showed the consolidative process in the right upper lobesuv7.88 concerning for infiltrating tumor. It appears to involve the pleura associated interstitial thickening was also seen. Bulky right hilar and mediastinal adenopathy. Enlarged somewhat necrotic appearing left supraclavicular lymph node was also hypermetabolic. Adrenal gland was not hypermetabolic and was consistent with a benign adenoma. No metastatic disease involving liver. No features of distant metastatic disease.  Pathology showed metastatic non-small cell lung cancer which was TTF-1 positive consistent with adenocarcinoma  Interval history- Patient reports difficulty sleeping. Denies other complaints  ECOG PS- 1 Pain scale- 0 Opioid associated  constipation- no  Review of systems- Review of Systems  Constitutional: Positive for malaise/fatigue. Negative for chills, fever and weight loss.  HENT: Negative for congestion, ear discharge and nosebleeds.   Eyes: Negative for blurred vision.  Respiratory: Negative for cough, hemoptysis, sputum production, shortness of breath and wheezing.   Cardiovascular: Negative for chest pain, palpitations, orthopnea and claudication.  Gastrointestinal: Negative for abdominal pain, blood in stool, constipation, diarrhea, heartburn, melena, nausea and vomiting.  Genitourinary: Negative for dysuria, flank pain, frequency, hematuria and urgency.  Musculoskeletal: Negative for back pain, joint pain and myalgias.  Skin: Negative for rash.  Neurological: Negative for dizziness, tingling, focal weakness, seizures, weakness and headaches.  Endo/Heme/Allergies: Does not bruise/bleed easily.  Psychiatric/Behavioral: Negative for depression and suicidal ideas. The patient has insomnia.       No Known Allergies   Past Medical History:  Diagnosis Date  . Bladder cancer ([place]) 2012  . COVID-19 virus infection 04/03/2020  . Hypertension   . Lung cancer ([place])   . Peripheral vascular disease [facility])      Past Surgical History:  Procedure Laterality Date  . BLADDER REMOVAL    . LOWER EXTREMITY ANGIOGRAPHY Left 04/12/2017   Procedure: Lower Extremity Angiography;  Surgeon: Katha Cabal, MD;  Location: [place] [facility];  Service: Cardiovascular;  Laterality: Left;  . PORTA CATH INSERTION N/A 05/22/2020   Procedure: PORTA CATH INSERTION;  Surgeon: Algernon Huxley, MD;  Location: [place] [facility];  Service: Cardiovascular;  Laterality: N/A;  . uretostomy    . VASCULAR SURGERY      Social History   Socioeconomic History  . Marital status: Married    Spouse name: Not on file  . Number of children: Not on file  . Years of education: Not on file  .  Highest education level: Not on file   Occupational History  . Not on file  Tobacco Use  . Smoking status: Former Smoker    Packs/day: 1.25    Years: 50.00    Pack years: 62.50    Types: Cigarettes    Quit date: 03/13/2020    Years since quitting: 0.5  . Smokeless tobacco: Never Used  Vaping Use  . Vaping Use: Never used  Substance and Sexual Activity  . Alcohol use: No  . Drug use: Not Currently    Types: Marijuana  . Sexual activity: Not Currently  Other Topics Concern  . Not on file  Social History Narrative  . Not on file   Social Determinants of Health   Financial Resource Strain: Not on file  Food Insecurity: Not on file  Transportation Needs: Not on file  Physical Activity: Not on file  Stress: Not on file  Social Connections: Not on file  Intimate Partner Violence: Not on file    No family history on file.   Current Outpatient Medications:  .  amLODipine (NORVASC) 10 MG tablet, Take 10 mg by mouth daily. , Disp: , Rfl:  .  atorvastatin (LIPITOR) 20 MG tablet, Take 20 mg by mouth every evening. , Disp: , Rfl:  .  carvedilol (COREG) 6.25 MG tablet, Take 6.25 mg by mouth 2 (two) times daily., Disp: , Rfl:  .  clopidogrel (PLAVIX) 75 MG tablet, Take 75 mg by mouth daily. , Disp: , Rfl:  .  dicyclomine (BENTYL) 10 MG capsule, Take 1 capsule (10 mg total) by mouth 4 (four) times daily -  before meals and at bedtime., Disp: 90 capsule, Rfl: 0 .  magic mouthwash SOLN, Take 5 mLs by mouth 4 (four) times daily as needed for mouth pain., Disp: 240 mL, Rfl: 0 .  potassium chloride SA (KLOR-CON) 20 MEQ tablet, Take 1 tablet (20 mEq total) by mouth daily., Disp: 14 tablet, Rfl: 0 .  oxyCODONE (OXY IR/ROXICODONE) 5 MG immediate release tablet, TAKE 1 TABLET BY MOUTH EVERY 8 HOURS AS NEEDED FOR SEVERE PAIN (Patient not taking: No sig reported), Disp: 30 tablet, Rfl: 0 No current facility-administered medications for this visit.  Facility-Administered Medications Ordered in Other Visits:  .  sodium chloride flush  (NS) 0.9 % injection 10 mL, 10 mL, Intravenous, PRN, Sindy Guadeloupe, MD, 10 mL at 09/18/20 0850  Physical exam:  Vitals:   09/18/20 0955  BP: 137/82  Pulse: 80  Resp: 18  Temp: (!) 96.9 F (36.1 C)  TempSrc: Tympanic  SpO2: 93%  Weight: 177 lb 4.8 oz (80.4 kg)   Physical Exam HENT:     Head: Normocephalic and atraumatic.  Eyes:     Extraocular Movements: EOM normal.     Pupils: Pupils are equal, round, and reactive to light.  Cardiovascular:     Rate and Rhythm: Normal rate and regular rhythm.     Heart sounds: Normal heart sounds.  Pulmonary:     Effort: Pulmonary effort is normal.     Breath sounds: Normal breath sounds.  Abdominal:     General: Bowel sounds are normal.     Palpations: Abdomen is soft.  Musculoskeletal:     Cervical back: Normal range of motion.  Skin:    General: Skin is warm and dry.  Neurological:     Mental Status: He is alert and oriented to person, place, and time.      CMP Latest Ref Rng & Units 09/18/2020  Glucose 70 - 99 mg/dL 115(H)  BUN 8 - 23 mg/dL 23  Creatinine 0.61 - 1.24 mg/dL 1.61(H)  Sodium 135 - 145 mmol/L 142  Potassium 3.5 - 5.1 mmol/L 3.4(L)  Chloride 98 - 111 mmol/L 109  CO2 22 - 32 mmol/L 24  Calcium 8.9 - 10.3 mg/dL 8.5(L)  Total Protein 6.5 - 8.1 g/dL 6.5  Total Bilirubin 0.3 - 1.2 mg/dL 0.6  Alkaline Phos 38 - 126 U/L 45  AST 15 - 41 U/L 22  ALT 0 - 44 U/L 19   CBC Latest Ref Rng & Units 09/18/2020  WBC 4.0 - 10.5 K/uL 5.7  Hemoglobin 13.0 - 17.0 g/dL 11.3(L)  Hematocrit 39.0 - 52.0 % 35.1(L)  Platelets 150 - 400 K/uL 266      Assessment and plan- Patient is a 70 y.o. male withstage IIIc adenocarcinoma of the right upper lobe of the lung cT4 N3 M0.  He is here for on treatment assessment prior to cycle 4 of maintenance durvalumab   Counts okay to proceed with cycle 4 of maintenance durvalumab today.  I will see him back in 2 weeks for cycle 5.  Plan is to continue durvalumab for 1 year for 26 treatments.   Patient will be due for repeat scans in end of March 2022.  Patient has baseline CKD with a creatinine that fluctuates between 1.5-1.6.  Overall stable.  Continue to monitor  Insomnia: I have asked the patient to try over-the-counter melatonin   Visit Diagnosis 1. Encounter for antineoplastic immunotherapy   2. Malignant neoplasm of upper lobe of right lung [facility])      Dr. Randa Evens, MD, MPH [facility] at [facility] [phone] 09/18/2020 1:20 PM

## 2020-10-02 ENCOUNTER — Inpatient Hospital Stay: Payer: Medicare Other | Attending: Oncology

## 2020-10-02 ENCOUNTER — Inpatient Hospital Stay: Payer: Medicare Other

## 2020-10-02 ENCOUNTER — Inpatient Hospital Stay (HOSPITAL_BASED_OUTPATIENT_CLINIC_OR_DEPARTMENT_OTHER): Payer: Medicare Other | Admitting: Oncology

## 2020-10-02 ENCOUNTER — Encounter: Payer: Self-pay | Admitting: Oncology

## 2020-10-02 VITALS — BP 145/78 | HR 80 | Temp 97.9°F | Resp 16 | Ht 71.0 in | Wt 178.3 lb

## 2020-10-02 DIAGNOSIS — Z8616 Personal history of COVID-19: Secondary | ICD-10-CM | POA: Diagnosis not present

## 2020-10-02 DIAGNOSIS — C3411 Malignant neoplasm of upper lobe, right bronchus or lung: Secondary | ICD-10-CM | POA: Diagnosis present

## 2020-10-02 DIAGNOSIS — Z87891 Personal history of nicotine dependence: Secondary | ICD-10-CM | POA: Insufficient documentation

## 2020-10-02 DIAGNOSIS — G4700 Insomnia, unspecified: Secondary | ICD-10-CM | POA: Insufficient documentation

## 2020-10-02 DIAGNOSIS — Z5112 Encounter for antineoplastic immunotherapy: Secondary | ICD-10-CM | POA: Diagnosis present

## 2020-10-02 DIAGNOSIS — Z8551 Personal history of malignant neoplasm of bladder: Secondary | ICD-10-CM | POA: Diagnosis not present

## 2020-10-02 DIAGNOSIS — N189 Chronic kidney disease, unspecified: Secondary | ICD-10-CM | POA: Diagnosis not present

## 2020-10-02 DIAGNOSIS — C3401 Malignant neoplasm of right main bronchus: Secondary | ICD-10-CM

## 2020-10-02 DIAGNOSIS — Z79899 Other long term (current) drug therapy: Secondary | ICD-10-CM | POA: Insufficient documentation

## 2020-10-02 DIAGNOSIS — I10 Essential (primary) hypertension: Secondary | ICD-10-CM | POA: Insufficient documentation

## 2020-10-02 DIAGNOSIS — Z95828 Presence of other vascular implants and grafts: Secondary | ICD-10-CM

## 2020-10-02 LAB — COMPREHENSIVE METABOLIC PANEL
ALT: 17 U/L (ref 0–44)
AST: 18 U/L (ref 15–41)
Albumin: 3.3 g/dL — ABNORMAL LOW (ref 3.5–5.0)
Alkaline Phosphatase: 48 U/L (ref 38–126)
Anion gap: 8 (ref 5–15)
BUN: 28 mg/dL — ABNORMAL HIGH (ref 8–23)
CO2: 22 mmol/L (ref 22–32)
Calcium: 8.7 mg/dL — ABNORMAL LOW (ref 8.9–10.3)
Chloride: 111 mmol/L (ref 98–111)
Creatinine, Ser: 1.65 mg/dL — ABNORMAL HIGH (ref 0.61–1.24)
GFR, Estimated: 45 mL/min — ABNORMAL LOW (ref >60)
Glucose, Bld: 116 mg/dL — ABNORMAL HIGH (ref 70–99)
Potassium: 3.6 mmol/L (ref 3.5–5.1)
Sodium: 141 mmol/L (ref 135–145)
Total Bilirubin: 0.7 mg/dL (ref 0.3–1.2)
Total Protein: 6.8 g/dL (ref 6.5–8.1)

## 2020-10-02 LAB — CBC WITH DIFFERENTIAL/PLATELET
Abs Immature Granulocytes: 0.01 10*3/uL (ref 0.00–0.07)
Basophils Absolute: 0 10*3/uL (ref 0.0–0.1)
Basophils Relative: 0 %
Eosinophils Absolute: 0.2 10*3/uL (ref 0.0–0.5)
Eosinophils Relative: 4 %
HCT: 36.8 % — ABNORMAL LOW (ref 39.0–52.0)
Hemoglobin: 11.7 g/dL — ABNORMAL LOW (ref 13.0–17.0)
Immature Granulocytes: 0 %
Lymphocytes Relative: 22 %
Lymphs Abs: 1.3 10*3/uL (ref 0.7–4.0)
MCH: 31.4 pg (ref 26.0–34.0)
MCHC: 31.8 g/dL (ref 30.0–36.0)
MCV: 98.7 fL (ref 80.0–100.0)
Monocytes Absolute: 0.7 10*3/uL (ref 0.1–1.0)
Monocytes Relative: 12 %
Neutro Abs: 3.9 10*3/uL (ref 1.7–7.7)
Neutrophils Relative %: 62 %
Platelets: 221 10*3/uL (ref 150–400)
RBC: 3.73 MIL/uL — ABNORMAL LOW (ref 4.22–5.81)
RDW: 12.9 % (ref 11.5–15.5)
WBC: 6.1 10*3/uL (ref 4.0–10.5)
nRBC: 0 % (ref 0.0–0.2)

## 2020-10-02 MED ORDER — SODIUM CHLORIDE 0.9 % IV SOLN
Freq: Once | INTRAVENOUS | Status: AC
  Filled 2020-10-02: qty 250

## 2020-10-02 MED ORDER — SODIUM CHLORIDE 0.9 % IV SOLN
10.0000 mg/kg | Freq: Once | INTRAVENOUS | Status: AC
  Administered 2020-10-02: 740 mg via INTRAVENOUS
  Filled 2020-10-02: qty 10

## 2020-10-02 MED ORDER — HEPARIN SOD (PORK) LOCK FLUSH 100 UNIT/ML IV SOLN
500.0000 [IU] | Freq: Once | INTRAVENOUS | Status: AC
  Administered 2020-10-02: 500 [IU] via INTRAVENOUS
  Filled 2020-10-02: qty 5

## 2020-10-02 MED ORDER — SODIUM CHLORIDE 0.9% FLUSH
10.0000 mL | Freq: Once | INTRAVENOUS | Status: AC
  Administered 2020-10-02: 10 mL via INTRAVENOUS
  Filled 2020-10-02: qty 10

## 2020-10-02 NOTE — Progress Notes (Signed)
Creatinine: 1.65. MD, Dr. Janese Banks, notified and aware. Per MD order: proceed with scheduled Imfinzi treatment today.

## 2020-10-02 NOTE — Progress Notes (Signed)
Pt doing ok, he feels like he cold starting in his throat. He also is getting covid vaccine soon and wanted to know if it is ok to get while on chemo

## 2020-10-02 NOTE — Progress Notes (Signed)
Hematology/Oncology Consult note [facility]  Telephone:([phone] [phone]  Patient Care Team: Perrin Maltese, MD as PCP - General (Internal Medicine) Telford Nab, RN as Oncology Nurse Navigator Sindy Guadeloupe, MD as Consulting Physician (Hematology and Oncology)   Name of the patient: Martin Jennings  [id number]  03-Nov-1950   Date of visit: 10/02/20  Diagnosis- stage IIIc adenocarcinoma of the lung cT3 cN3 M0   Chief complaint/ Reason for visit-on treatment assessment prior to cycle 5 of maintenance durvalumab  Heme/Onc history: Patient is a 70 year old male with a history of tobacco dependence who was admitted to the hospital for symptoms of acute onset shortness of breath and was found to have Covid pneumonia. He underwent CT angio chest which showed extensive mediastinal as well as bilateral hilar adenopathy. Masslike opacity with architectural distortion involving the right upper lobe measuring 5.7 x 2.9 x 2.3 cm and groundglass opacities bilaterally. Mass involving the left adrenal gland as well as a smaller right adrenal gland possibly indicating adrenal metastases.  This was followed by a PET CT scan 2 weeks later which showed the consolidative process in the right upper lobesuv7.88 concerning for infiltrating tumor. It appears to involve the pleura associated interstitial thickening was also seen. Bulky right hilar and mediastinal adenopathy. Enlarged somewhat necrotic appearing left supraclavicular lymph node was also hypermetabolic. Adrenal gland was not hypermetabolic and was consistent with a benign adenoma. No metastatic disease involving liver. No features of distant metastatic disease.  Pathology showed metastatic non-small cell lung cancer which was TTF-1 positive consistent with adenocarcinoma   Interval history-reports doing well overall.  Denies any shortness of breath and has not been using his oxygen at rest or  on ambulation.  He has been trying to sleep earlier which has improved his quality of sleep.  He has not required any sleep aids  ECOG PS- 1 Pain scale- 0   Review of systems- Review of Systems  Constitutional: Positive for malaise/fatigue. Negative for chills, fever and weight loss.  HENT: Negative for congestion, ear discharge and nosebleeds.   Eyes: Negative for blurred vision.  Respiratory: Negative for cough, hemoptysis, sputum production, shortness of breath and wheezing.   Cardiovascular: Negative for chest pain, palpitations, orthopnea and claudication.  Gastrointestinal: Negative for abdominal pain, blood in stool, constipation, diarrhea, heartburn, melena, nausea and vomiting.  Genitourinary: Negative for dysuria, flank pain, frequency, hematuria and urgency.  Musculoskeletal: Negative for back pain, joint pain and myalgias.  Skin: Negative for rash.  Neurological: Negative for dizziness, tingling, focal weakness, seizures, weakness and headaches.  Endo/Heme/Allergies: Does not bruise/bleed easily.  Psychiatric/Behavioral: Negative for depression and suicidal ideas. The patient does not have insomnia.       No Known Allergies   Past Medical History:  Diagnosis Date  . Bladder cancer ([place]) 2012  . COVID-19 virus infection 04/03/2020  . Hypertension   . Lung cancer ([place])   . Peripheral vascular disease [facility])      Past Surgical History:  Procedure Laterality Date  . BLADDER REMOVAL    . LOWER EXTREMITY ANGIOGRAPHY Left 04/12/2017   Procedure: Lower Extremity Angiography;  Surgeon: Katha Cabal, MD;  Location: [place] [facility];  Service: Cardiovascular;  Laterality: Left;  . PORTA CATH INSERTION N/A 05/22/2020   Procedure: PORTA CATH INSERTION;  Surgeon: Algernon Huxley, MD;  Location: [place] [facility];  Service: Cardiovascular;  Laterality: N/A;  . uretostomy    . VASCULAR SURGERY  Social History   Socioeconomic History  . Marital status: Married     Spouse name: Not on file  . Number of children: Not on file  . Years of education: Not on file  . Highest education level: Not on file  Occupational History  . Not on file  Tobacco Use  . Smoking status: Former Smoker    Packs/day: 1.25    Years: 50.00    Pack years: 62.50    Types: Cigarettes    Quit date: 03/13/2020    Years since quitting: 0.5  . Smokeless tobacco: Never Used  Vaping Use  . Vaping Use: Never used  Substance and Sexual Activity  . Alcohol use: No  . Drug use: Not Currently    Types: Marijuana  . Sexual activity: Not Currently  Other Topics Concern  . Not on file  Social History Narrative  . Not on file   Social Determinants of Health   Financial Resource Strain: Not on file  Food Insecurity: Not on file  Transportation Needs: Not on file  Physical Activity: Not on file  Stress: Not on file  Social Connections: Not on file  Intimate Partner Violence: Not on file    No family history on file.   Current Outpatient Medications:  .  amLODipine (NORVASC) 10 MG tablet, Take 10 mg by mouth daily. , Disp: , Rfl:  .  atorvastatin (LIPITOR) 20 MG tablet, Take 20 mg by mouth every evening. , Disp: , Rfl:  .  carvedilol (COREG) 6.25 MG tablet, Take 6.25 mg by mouth 2 (two) times daily., Disp: , Rfl:  .  clopidogrel (PLAVIX) 75 MG tablet, Take 75 mg by mouth daily. , Disp: , Rfl:  .  dicyclomine (BENTYL) 10 MG capsule, Take 1 capsule (10 mg total) by mouth 4 (four) times daily -  before meals and at bedtime., Disp: 90 capsule, Rfl: 0 .  magic mouthwash SOLN, Take 5 mLs by mouth 4 (four) times daily as needed for mouth pain., Disp: 240 mL, Rfl: 0 .  oxyCODONE (OXY IR/ROXICODONE) 5 MG immediate release tablet, TAKE 1 TABLET BY MOUTH EVERY 8 HOURS AS NEEDED FOR SEVERE PAIN (Patient not taking: No sig reported), Disp: 30 tablet, Rfl: 0 .  potassium chloride SA (KLOR-CON) 20 MEQ tablet, Take 1 tablet (20 mEq total) by mouth daily., Disp: 14 tablet, Rfl: 0 No current  facility-administered medications for this visit.  Facility-Administered Medications Ordered in Other Visits:  .  heparin lock flush 100 unit/mL, 500 Units, Intravenous, Once, Randa Evens C, MD .  sodium chloride flush (NS) 0.9 % injection 10 mL, 10 mL, Intravenous, PRN, Sindy Guadeloupe, MD, 10 mL at 09/18/20 0850  Physical exam:  Vitals:   10/02/20 0900  BP: (!) 145/78  Pulse: 80  Resp: 16  Temp: 97.9 F (36.6 C)  TempSrc: Oral  Weight: 178 lb 4.8 oz (80.9 kg)  Height: 5\' 11"  (1.803 m)   Physical Exam HENT:     Head: Normocephalic and atraumatic.  Eyes:     Pupils: Pupils are equal, round, and reactive to light.  Cardiovascular:     Rate and Rhythm: Normal rate and regular rhythm.     Heart sounds: Normal heart sounds.  Pulmonary:     Effort: Pulmonary effort is normal.     Breath sounds: Normal breath sounds.  Abdominal:     General: Bowel sounds are normal.     Palpations: Abdomen is soft.  Musculoskeletal:     Cervical  back: Normal range of motion.  Skin:    General: Skin is warm and dry.  Neurological:     Mental Status: He is alert and oriented to person, place, and time.      CMP Latest Ref Rng & Units 10/02/2020  Glucose 70 - 99 mg/dL 116(H)  BUN 8 - 23 mg/dL 28(H)  Creatinine 0.61 - 1.24 mg/dL 1.65(H)  Sodium 135 - 145 mmol/L 141  Potassium 3.5 - 5.1 mmol/L 3.6  Chloride 98 - 111 mmol/L 111  CO2 22 - 32 mmol/L 22  Calcium 8.9 - 10.3 mg/dL 8.7(L)  Total Protein 6.5 - 8.1 g/dL 6.8  Total Bilirubin 0.3 - 1.2 mg/dL 0.7  Alkaline Phos 38 - 126 U/L 48  AST 15 - 41 U/L 18  ALT 0 - 44 U/L 17   CBC Latest Ref Rng & Units 10/02/2020  WBC 4.0 - 10.5 K/uL 6.1  Hemoglobin 13.0 - 17.0 g/dL 11.7(L)  Hematocrit 39.0 - 52.0 % 36.8(L)  Platelets 150 - 400 K/uL 221      Assessment and plan- Patient is a 70 y.o. male withstage IIIc adenocarcinoma of the right upper lobe of the lung cT4 N3 M0. He is here for on treatment assessment prior to cycle 5 of maintenance  durvalumab  Counts okay to proceed with cycle 5 of maintenance durvalumab today.  I will see him back in 2 weeks for cycle 6.  Plan to repeat scans after 6 cycles.  CKD: Overall stable.  Continue to monitor  Insomnia: Currently improved after changing sleep habits   Visit Diagnosis 1. Encounter for antineoplastic immunotherapy   2. Malignant neoplasm of upper lobe of right lung [facility])      Dr. Randa Evens, MD, MPH [facility] [phone] 10/02/2020 8:47 AM

## 2020-10-16 ENCOUNTER — Inpatient Hospital Stay: Payer: Medicare Other

## 2020-10-16 ENCOUNTER — Encounter: Payer: Self-pay | Admitting: Oncology

## 2020-10-16 ENCOUNTER — Inpatient Hospital Stay (HOSPITAL_BASED_OUTPATIENT_CLINIC_OR_DEPARTMENT_OTHER): Payer: Medicare Other | Admitting: Oncology

## 2020-10-16 VITALS — BP 141/76 | HR 76 | Temp 96.2°F | Resp 18 | Wt 176.5 lb

## 2020-10-16 DIAGNOSIS — C3401 Malignant neoplasm of right main bronchus: Secondary | ICD-10-CM

## 2020-10-16 DIAGNOSIS — Z5112 Encounter for antineoplastic immunotherapy: Secondary | ICD-10-CM | POA: Diagnosis not present

## 2020-10-16 LAB — CBC WITH DIFFERENTIAL/PLATELET
Abs Immature Granulocytes: 0.01 10*3/uL (ref 0.00–0.07)
Basophils Absolute: 0 10*3/uL (ref 0.0–0.1)
Basophils Relative: 0 %
Eosinophils Absolute: 0.2 10*3/uL (ref 0.0–0.5)
Eosinophils Relative: 4 %
HCT: 36.3 % — ABNORMAL LOW (ref 39.0–52.0)
Hemoglobin: 11.9 g/dL — ABNORMAL LOW (ref 13.0–17.0)
Immature Granulocytes: 0 %
Lymphocytes Relative: 18 %
Lymphs Abs: 1 10*3/uL (ref 0.7–4.0)
MCH: 31.3 pg (ref 26.0–34.0)
MCHC: 32.8 g/dL (ref 30.0–36.0)
MCV: 95.5 fL (ref 80.0–100.0)
Monocytes Absolute: 0.6 10*3/uL (ref 0.1–1.0)
Monocytes Relative: 11 %
Neutro Abs: 3.9 10*3/uL (ref 1.7–7.7)
Neutrophils Relative %: 67 %
Platelets: 232 10*3/uL (ref 150–400)
RBC: 3.8 MIL/uL — ABNORMAL LOW (ref 4.22–5.81)
RDW: 12.7 % (ref 11.5–15.5)
WBC: 5.7 10*3/uL (ref 4.0–10.5)
nRBC: 0 % (ref 0.0–0.2)

## 2020-10-16 LAB — COMPREHENSIVE METABOLIC PANEL
ALT: 15 U/L (ref 0–44)
AST: 17 U/L (ref 15–41)
Albumin: 3.3 g/dL — ABNORMAL LOW (ref 3.5–5.0)
Alkaline Phosphatase: 57 U/L (ref 38–126)
Anion gap: 7 (ref 5–15)
BUN: 21 mg/dL (ref 8–23)
CO2: 23 mmol/L (ref 22–32)
Calcium: 8.7 mg/dL — ABNORMAL LOW (ref 8.9–10.3)
Chloride: 110 mmol/L (ref 98–111)
Creatinine, Ser: 1.63 mg/dL — ABNORMAL HIGH (ref 0.61–1.24)
GFR, Estimated: 45 mL/min — ABNORMAL LOW (ref >60)
Glucose, Bld: 111 mg/dL — ABNORMAL HIGH (ref 70–99)
Potassium: 3.4 mmol/L — ABNORMAL LOW (ref 3.5–5.1)
Sodium: 140 mmol/L (ref 135–145)
Total Bilirubin: 0.5 mg/dL (ref 0.3–1.2)
Total Protein: 6.7 g/dL (ref 6.5–8.1)

## 2020-10-16 MED ORDER — SODIUM CHLORIDE 0.9% FLUSH
10.0000 mL | INTRAVENOUS | Status: DC | PRN
  Filled 2020-10-16: qty 10

## 2020-10-16 MED ORDER — HEPARIN SOD (PORK) LOCK FLUSH 100 UNIT/ML IV SOLN
500.0000 [IU] | Freq: Once | INTRAVENOUS | Status: AC | PRN
  Administered 2020-10-16: 500 [IU]
  Filled 2020-10-16: qty 5

## 2020-10-16 MED ORDER — SODIUM CHLORIDE 0.9 % IV SOLN
Freq: Once | INTRAVENOUS | Status: AC
  Filled 2020-10-16: qty 250

## 2020-10-16 MED ORDER — HEPARIN SOD (PORK) LOCK FLUSH 100 UNIT/ML IV SOLN
INTRAVENOUS | Status: AC
  Filled 2020-10-16: qty 5

## 2020-10-16 MED ORDER — SODIUM CHLORIDE 0.9 % IV SOLN
10.0000 mg/kg | Freq: Once | INTRAVENOUS | Status: AC
  Administered 2020-10-16: 740 mg via INTRAVENOUS
  Filled 2020-10-16: qty 10

## 2020-10-16 NOTE — Progress Notes (Signed)
Pt in for follow up, reports had diarrhea for 1 week following treatment, states last loose stool was on Saturday.  Pt reports increased shortness of breath but is not wearing oxygen as much.

## 2020-10-16 NOTE — Progress Notes (Signed)
Hematology/Oncology Consult note [facility]  [phone] Fax:([phone]  Patient Care Team: Perrin Maltese, MD as PCP - General (Internal Medicine) Telford Nab, RN as Oncology Nurse Navigator Sindy Guadeloupe, MD as Consulting Physician (Hematology and Oncology)   Name of the patient: Martin Jennings  [id number]  [birth year]   Date of visit: 10/16/20  Diagnosis- stage IIIc adenocarcinoma of the lung cT3 cN3 M0  Chief complaint/ Reason for visit-on treatment assessment prior to cycle 6 of maintenance durvalumab  Heme/Onc history: Patient is a 70 year old male with a history of tobacco dependence who was admitted to the hospital for symptoms of acute onset shortness of breath and was found to have Covid pneumonia. He underwent CT angio chest which showed extensive mediastinal as well as bilateral hilar adenopathy. Masslike opacity with architectural distortion involving the right upper lobe measuring 5.7 x 2.9 x 2.3 cm and groundglass opacities bilaterally. Mass involving the left adrenal gland as well as a smaller right adrenal gland possibly indicating adrenal metastases.  This was followed by a PET CT scan 2 weeks later which showed the consolidative process in the right upper lobesuv7.88 concerning for infiltrating tumor. It appears to involve the pleura associated interstitial thickening was also seen. Bulky right hilar and mediastinal adenopathy. Enlarged somewhat necrotic appearing left supraclavicular lymph node was also hypermetabolic. Adrenal gland was not hypermetabolic and was consistent with a benign adenoma. No metastatic disease involving liver. No features of distant metastatic disease.  Pathology showed metastatic non-small cell lung cancer which was TTF-1 positive consistent with adenocarcinoma  Interval history-patient reports baseline fatigue and mild exertional shortness of breath.  He had intermittent diarrhea over  the last 1 week which she says has now resolved.  ECOG PS- 1 Pain scale- 0   Review of systems- Review of Systems  Constitutional: Positive for malaise/fatigue. Negative for chills, fever and weight loss.  HENT: Negative for congestion, ear discharge and nosebleeds.   Eyes: Negative for blurred vision.  Respiratory: Negative for cough, hemoptysis, sputum production, shortness of breath and wheezing.   Cardiovascular: Negative for chest pain, palpitations, orthopnea and claudication.  Gastrointestinal: Negative for abdominal pain, blood in stool, constipation, diarrhea, heartburn, melena, nausea and vomiting.  Genitourinary: Negative for dysuria, flank pain, frequency, hematuria and urgency.  Musculoskeletal: Negative for back pain, joint pain and myalgias.  Skin: Negative for rash.  Neurological: Negative for dizziness, tingling, focal weakness, seizures, weakness and headaches.  Endo/Heme/Allergies: Does not bruise/bleed easily.  Psychiatric/Behavioral: Negative for depression and suicidal ideas. The patient does not have insomnia.        No Known Allergies   Past Medical History:  Diagnosis Date  . Bladder cancer ([place]) 2012  . COVID-19 virus infection 04/03/2020  . Hypertension   . Lung cancer ([place])   . Peripheral vascular disease [facility])      Past Surgical History:  Procedure Laterality Date  . BLADDER REMOVAL    . LOWER EXTREMITY ANGIOGRAPHY Left 04/12/2017   Procedure: Lower Extremity Angiography;  Surgeon: Katha Cabal, MD;  Location: [place] [facility];  Service: Cardiovascular;  Laterality: Left;  . PORTA CATH INSERTION N/A 05/22/2020   Procedure: PORTA CATH INSERTION;  Surgeon: Algernon Huxley, MD;  Location: [place] [facility];  Service: Cardiovascular;  Laterality: N/A;  . uretostomy    . VASCULAR SURGERY      Social History   Socioeconomic History  . Marital status: Married    Spouse name: Not on  file  . Number of children: Not on file  . Years of  education: Not on file  . Highest education level: Not on file  Occupational History  . Not on file  Tobacco Use  . Smoking status: Former Smoker    Packs/day: 1.25    Years: 50.00    Pack years: 62.50    Types: Cigarettes    Quit date: 03/13/2020    Years since quitting: 0.5  . Smokeless tobacco: Never Used  Vaping Use  . Vaping Use: Never used  Substance and Sexual Activity  . Alcohol use: No  . Drug use: Not Currently    Types: Marijuana  . Sexual activity: Not Currently  Other Topics Concern  . Not on file  Social History Narrative  . Not on file   Social Determinants of Health   Financial Resource Strain: Not on file  Food Insecurity: Not on file  Transportation Needs: Not on file  Physical Activity: Not on file  Stress: Not on file  Social Connections: Not on file  Intimate Partner Violence: Not on file    History reviewed. No pertinent family history.   Current Outpatient Medications:  .  amLODipine (NORVASC) 10 MG tablet, Take 10 mg by mouth daily. , Disp: , Rfl:  .  atorvastatin (LIPITOR) 20 MG tablet, Take 20 mg by mouth every evening. , Disp: , Rfl:  .  clopidogrel (PLAVIX) 75 MG tablet, Take 75 mg by mouth daily. , Disp: , Rfl:  .  magic mouthwash SOLN, Take 5 mLs by mouth 4 (four) times daily as needed for mouth pain. (Patient not taking: Reported on 10/16/2020), Disp: 240 mL, Rfl: 0 .  nystatin (MYCOSTATIN) 100000 UNIT/ML suspension, Take 5 mLs by mouth 4 (four) times daily. (Patient not taking: Reported on 10/16/2020), Disp: , Rfl:  .  oxyCODONE (OXY IR/ROXICODONE) 5 MG immediate release tablet, TAKE 1 TABLET BY MOUTH EVERY 8 HOURS AS NEEDED FOR SEVERE PAIN (Patient not taking: No sig reported), Disp: 30 tablet, Rfl: 0 No current facility-administered medications for this visit.  Facility-Administered Medications Ordered in Other Visits:  .  sodium chloride flush (NS) 0.9 % injection 10 mL, 10 mL, Intravenous, PRN, Sindy Guadeloupe, MD, 10 mL at 09/18/20  0850 .  sodium chloride flush (NS) 0.9 % injection 10 mL, 10 mL, Intracatheter, PRN, Sindy Guadeloupe, MD  Physical exam:  Vitals:   10/16/20 0849  BP: (!) 141/76  Pulse: 76  Resp: 18  Temp: (!) 96.2 F (35.7 C)  TempSrc: Tympanic  SpO2: 95%  Weight: 176 lb 8 oz (80.1 kg)   Physical Exam Constitutional:      General: He is not in acute distress. Cardiovascular:     Rate and Rhythm: Normal rate and regular rhythm.     Heart sounds: Normal heart sounds.  Pulmonary:     Effort: Pulmonary effort is normal.     Breath sounds: Normal breath sounds.  Abdominal:     General: Bowel sounds are normal.     Palpations: Abdomen is soft.  Skin:    General: Skin is warm and dry.  Neurological:     Mental Status: He is alert and oriented to person, place, and time.      CMP Latest Ref Rng & Units 10/16/2020  Glucose 70 - 99 mg/dL 111(H)  BUN 8 - 23 mg/dL 21  Creatinine 0.61 - 1.24 mg/dL 1.63(H)  Sodium 135 - 145 mmol/L 140  Potassium 3.5 - 5.1 mmol/L 3.4(L)  Chloride 98 - 111 mmol/L 110  CO2 22 - 32 mmol/L 23  Calcium 8.9 - 10.3 mg/dL 8.7(L)  Total Protein 6.5 - 8.1 g/dL 6.7  Total Bilirubin 0.3 - 1.2 mg/dL 0.5  Alkaline Phos 38 - 126 U/L 57  AST 15 - 41 U/L 17  ALT 0 - 44 U/L 15   CBC Latest Ref Rng & Units 10/16/2020  WBC 4.0 - 10.5 K/uL 5.7  Hemoglobin 13.0 - 17.0 g/dL 11.9(L)  Hematocrit 39.0 - 52.0 % 36.3(L)  Platelets 150 - 400 K/uL 232     Assessment and plan- Patient is a 70 y.o. male withstage IIIc adenocarcinoma of the right upper lobe of the lung cT4 N3 M0.  He is here for on treatment assessment prior to cycle 6 of maintenance durvalumab  Counts okay to proceed with cycle 6 of maintenance durvalumab today.  I will see him in 2 weeks for cycle 7.  Overall tolerating treatment well without any significant side effects.  CKD: Overall stable.  He has repeat CT chest abdomen and pelvis with contrast scheduled on 11/03/2020.   Visit Diagnosis 1. Malignant neoplasm  of hilus of right lung ([place])   2. Encounter for antineoplastic immunotherapy      Dr. Randa Evens, MD, MPH [facility] at [facility] [phone] 10/16/2020 11:53 AM

## 2020-10-30 ENCOUNTER — Inpatient Hospital Stay (HOSPITAL_BASED_OUTPATIENT_CLINIC_OR_DEPARTMENT_OTHER): Payer: Medicare Other | Admitting: Oncology

## 2020-10-30 ENCOUNTER — Inpatient Hospital Stay: Payer: Medicare Other | Attending: Oncology

## 2020-10-30 ENCOUNTER — Inpatient Hospital Stay: Payer: Medicare Other

## 2020-10-30 ENCOUNTER — Encounter: Payer: Self-pay | Admitting: Oncology

## 2020-10-30 ENCOUNTER — Other Ambulatory Visit: Payer: Self-pay | Admitting: *Deleted

## 2020-10-30 VITALS — BP 136/69 | HR 73 | Temp 97.7°F | Resp 16 | Ht 71.0 in | Wt 179.8 lb

## 2020-10-30 DIAGNOSIS — Z5112 Encounter for antineoplastic immunotherapy: Secondary | ICD-10-CM | POA: Diagnosis not present

## 2020-10-30 DIAGNOSIS — C3401 Malignant neoplasm of right main bronchus: Secondary | ICD-10-CM | POA: Diagnosis not present

## 2020-10-30 DIAGNOSIS — N189 Chronic kidney disease, unspecified: Secondary | ICD-10-CM | POA: Insufficient documentation

## 2020-10-30 DIAGNOSIS — C3411 Malignant neoplasm of upper lobe, right bronchus or lung: Secondary | ICD-10-CM | POA: Insufficient documentation

## 2020-10-30 DIAGNOSIS — Z87891 Personal history of nicotine dependence: Secondary | ICD-10-CM | POA: Insufficient documentation

## 2020-10-30 DIAGNOSIS — I10 Essential (primary) hypertension: Secondary | ICD-10-CM | POA: Insufficient documentation

## 2020-10-30 DIAGNOSIS — M25552 Pain in left hip: Secondary | ICD-10-CM | POA: Insufficient documentation

## 2020-10-30 DIAGNOSIS — Z79899 Other long term (current) drug therapy: Secondary | ICD-10-CM | POA: Diagnosis not present

## 2020-10-30 DIAGNOSIS — Z8551 Personal history of malignant neoplasm of bladder: Secondary | ICD-10-CM | POA: Diagnosis not present

## 2020-10-30 DIAGNOSIS — N1832 Chronic kidney disease, stage 3b: Secondary | ICD-10-CM | POA: Insufficient documentation

## 2020-10-30 DIAGNOSIS — Z8616 Personal history of COVID-19: Secondary | ICD-10-CM | POA: Insufficient documentation

## 2020-10-30 DIAGNOSIS — Z923 Personal history of irradiation: Secondary | ICD-10-CM | POA: Insufficient documentation

## 2020-10-30 DIAGNOSIS — Z5111 Encounter for antineoplastic chemotherapy: Secondary | ICD-10-CM | POA: Insufficient documentation

## 2020-10-30 DIAGNOSIS — G8929 Other chronic pain: Secondary | ICD-10-CM | POA: Diagnosis not present

## 2020-10-30 LAB — CBC WITH DIFFERENTIAL/PLATELET
Abs Immature Granulocytes: 0.01 10*3/uL (ref 0.00–0.07)
Basophils Absolute: 0 10*3/uL (ref 0.0–0.1)
Basophils Relative: 1 %
Eosinophils Absolute: 0.3 10*3/uL (ref 0.0–0.5)
Eosinophils Relative: 6 %
HCT: 37.2 % — ABNORMAL LOW (ref 39.0–52.0)
Hemoglobin: 12.3 g/dL — ABNORMAL LOW (ref 13.0–17.0)
Immature Granulocytes: 0 %
Lymphocytes Relative: 24 %
Lymphs Abs: 1.2 10*3/uL (ref 0.7–4.0)
MCH: 31 pg (ref 26.0–34.0)
MCHC: 33.1 g/dL (ref 30.0–36.0)
MCV: 93.7 fL (ref 80.0–100.0)
Monocytes Absolute: 0.5 10*3/uL (ref 0.1–1.0)
Monocytes Relative: 11 %
Neutro Abs: 3 10*3/uL (ref 1.7–7.7)
Neutrophils Relative %: 58 %
Platelets: 191 10*3/uL (ref 150–400)
RBC: 3.97 MIL/uL — ABNORMAL LOW (ref 4.22–5.81)
RDW: 12.3 % (ref 11.5–15.5)
WBC: 5.1 10*3/uL (ref 4.0–10.5)
nRBC: 0 % (ref 0.0–0.2)

## 2020-10-30 LAB — COMPREHENSIVE METABOLIC PANEL
ALT: 13 U/L (ref 0–44)
AST: 15 U/L (ref 15–41)
Albumin: 3.3 g/dL — ABNORMAL LOW (ref 3.5–5.0)
Alkaline Phosphatase: 60 U/L (ref 38–126)
Anion gap: 8 (ref 5–15)
BUN: 25 mg/dL — ABNORMAL HIGH (ref 8–23)
CO2: 24 mmol/L (ref 22–32)
Calcium: 8.9 mg/dL (ref 8.9–10.3)
Chloride: 109 mmol/L (ref 98–111)
Creatinine, Ser: 1.69 mg/dL — ABNORMAL HIGH (ref 0.61–1.24)
GFR, Estimated: 43 mL/min — ABNORMAL LOW (ref >60)
Glucose, Bld: 113 mg/dL — ABNORMAL HIGH (ref 70–99)
Potassium: 3.7 mmol/L (ref 3.5–5.1)
Sodium: 141 mmol/L (ref 135–145)
Total Bilirubin: 0.4 mg/dL (ref 0.3–1.2)
Total Protein: 6.9 g/dL (ref 6.5–8.1)

## 2020-10-30 MED ORDER — HEPARIN SOD (PORK) LOCK FLUSH 100 UNIT/ML IV SOLN
500.0000 [IU] | Freq: Once | INTRAVENOUS | Status: AC
  Administered 2020-10-30: 500 [IU] via INTRAVENOUS
  Filled 2020-10-30: qty 5

## 2020-10-30 MED ORDER — OXYCODONE HCL 5 MG PO TABS: ORAL_TABLET | ORAL | 0 refills | Status: DC

## 2020-10-30 MED ORDER — SODIUM CHLORIDE 0.9 % IV SOLN
Freq: Once | INTRAVENOUS | Status: AC
  Filled 2020-10-30: qty 250

## 2020-10-30 MED ORDER — DURVALUMAB 500 MG/10ML IV SOLN
10.0000 mg/kg | Freq: Once | INTRAVENOUS | Status: AC
  Administered 2020-10-30: 740 mg via INTRAVENOUS
  Filled 2020-10-30: qty 4.8

## 2020-10-30 MED ORDER — HEPARIN SOD (PORK) LOCK FLUSH 100 UNIT/ML IV SOLN
INTRAVENOUS | Status: AC
  Filled 2020-10-30: qty 5

## 2020-10-30 MED ORDER — SODIUM CHLORIDE 0.9% FLUSH
10.0000 mL | INTRAVENOUS | Status: DC | PRN
  Administered 2020-10-30: 10 mL via INTRAVENOUS
  Filled 2020-10-30: qty 10

## 2020-10-30 NOTE — Progress Notes (Signed)
Creatinine: 1.69. MD, Dr. Janese Banks, notified and aware. Per MD order: proceed with scheduled Imfinzi treatment today.

## 2020-10-30 NOTE — Progress Notes (Signed)
Pt states that he eating and drinking good, good BM now. He has not used is oxygen in 63months. He states that he has fatigue when in the yard doing work after 10 min. He also has some pain at times on left hip.

## 2020-10-30 NOTE — Progress Notes (Signed)
Hematology/Oncology Consult note [facility]  [phone] [phone]  Patient Care Team: Perrin Maltese, MD as PCP - General (Internal Medicine) Telford Nab, RN as Oncology Nurse Navigator Sindy Guadeloupe, MD as Consulting Physician (Hematology and Oncology)   Name of the patient: Martin Jennings  [id number]  21-Aug-1950   Date of visit: 10/30/20  Diagnosis- stage IIIc adenocarcinoma of the lung cT3 cN3 M0  Chief complaint/ Reason for visit-on treatment assessment prior to cycle 7 of maintenance durvalumab  Heme/Onc history: Patient is a 70 year old male with a history of tobacco dependence who was admitted to the hospital for symptoms of acute onset shortness of breath and was found to have Covid pneumonia. He underwent CT angio chest which showed extensive mediastinal as well as bilateral hilar adenopathy. Masslike opacity with architectural distortion involving the right upper lobe measuring 5.7 x 2.9 x 2.3 cm and groundglass opacities bilaterally. Mass involving the left adrenal gland as well as a smaller right adrenal gland possibly indicating adrenal metastases.  This was followed by a PET CT scan 2 weeks later which showed the consolidative process in the right upper lobesuv7.88 concerning for infiltrating tumor. It appears to involve the pleura associated interstitial thickening was also seen. Bulky right hilar and mediastinal adenopathy. Enlarged somewhat necrotic appearing left supraclavicular lymph node was also hypermetabolic. Adrenal gland was not hypermetabolic and was consistent with a benign adenoma. No metastatic disease involving liver. No features of distant metastatic disease.  Pathology showed metastatic non-small cell lung cancer which was TTF-1 positive consistent with adenocarcinoma   Interval history-patient reports doing well overall.  Denies any significant shortness of breath.  Reports occasional left  hip pain which she says has been chronic even prior to diagnosis of lung cancer.  ECOG PS- 1 Pain scale- 3 Opioid associated constipation- no  Review of systems- Review of Systems  Constitutional: Negative for chills, fever, malaise/fatigue and weight loss.  HENT: Negative for congestion, ear discharge and nosebleeds.   Eyes: Negative for blurred vision.  Respiratory: Negative for cough, hemoptysis, sputum production, shortness of breath and wheezing.   Cardiovascular: Negative for chest pain, palpitations, orthopnea and claudication.  Gastrointestinal: Negative for abdominal pain, blood in stool, constipation, diarrhea, heartburn, melena, nausea and vomiting.  Genitourinary: Negative for dysuria, flank pain, frequency, hematuria and urgency.  Musculoskeletal: Negative for back pain, joint pain and myalgias.       Left hip pain  Skin: Negative for rash.  Neurological: Negative for dizziness, tingling, focal weakness, seizures, weakness and headaches.  Endo/Heme/Allergies: Does not bruise/bleed easily.  Psychiatric/Behavioral: Negative for depression and suicidal ideas. The patient does not have insomnia.       No Known Allergies   Past Medical History:  Diagnosis Date  . Bladder cancer ([place]) 2012  . COVID-19 virus infection 04/03/2020  . Hypertension   . Lung cancer ([place])   . Peripheral vascular disease [facility])      Past Surgical History:  Procedure Laterality Date  . BLADDER REMOVAL    . LOWER EXTREMITY ANGIOGRAPHY Left 04/12/2017   Procedure: Lower Extremity Angiography;  Surgeon: Katha Cabal, MD;  Location: [place] [facility];  Service: Cardiovascular;  Laterality: Left;  . PORTA CATH INSERTION N/A 05/22/2020   Procedure: PORTA CATH INSERTION;  Surgeon: Algernon Huxley, MD;  Location: [place] [facility];  Service: Cardiovascular;  Laterality: N/A;  . uretostomy    . VASCULAR SURGERY      Social History  Socioeconomic History  . Marital status: Married     Spouse name: Not on file  . Number of children: Not on file  . Years of education: Not on file  . Highest education level: Not on file  Occupational History  . Not on file  Tobacco Use  . Smoking status: Former Smoker    Packs/day: 1.25    Years: 50.00    Pack years: 62.50    Types: Cigarettes    Quit date: 03/13/2020    Years since quitting: 0.6  . Smokeless tobacco: Never Used  Vaping Use  . Vaping Use: Never used  Substance and Sexual Activity  . Alcohol use: No  . Drug use: Not Currently    Types: Marijuana  . Sexual activity: Not Currently  Other Topics Concern  . Not on file  Social History Narrative  . Not on file   Social Determinants of Health   Financial Resource Strain: Not on file  Food Insecurity: Not on file  Transportation Needs: Not on file  Physical Activity: Not on file  Stress: Not on file  Social Connections: Not on file  Intimate Partner Violence: Not on file    History reviewed. No pertinent family history.   Current Outpatient Medications:  .  amLODipine (NORVASC) 10 MG tablet, Take 10 mg by mouth daily. , Disp: , Rfl:  .  atorvastatin (LIPITOR) 20 MG tablet, Take 20 mg by mouth every evening. , Disp: , Rfl:  .  clopidogrel (PLAVIX) 75 MG tablet, Take 75 mg by mouth daily. , Disp: , Rfl:  .  magic mouthwash SOLN, Take 5 mLs by mouth 4 (four) times daily as needed for mouth pain. (Patient not taking: No sig reported), Disp: 240 mL, Rfl: 0 .  oxyCODONE (OXY IR/ROXICODONE) 5 MG immediate release tablet, TAKE 1 TABLET BY MOUTH EVERY 8 HOURS AS NEEDED FOR SEVERE PAIN, Disp: 30 tablet, Rfl: 0 No current facility-administered medications for this visit.  Facility-Administered Medications Ordered in Other Visits:  .  sodium chloride flush (NS) 0.9 % injection 10 mL, 10 mL, Intravenous, PRN, Sindy Guadeloupe, MD, 10 mL at 09/18/20 0850 .  sodium chloride flush (NS) 0.9 % injection 10 mL, 10 mL, Intravenous, PRN, Sindy Guadeloupe, MD, 10 mL at 10/30/20  0840  Physical exam:  Vitals:   10/30/20 0904  BP: 136/69  Pulse: 73  Resp: 16  Temp: 97.7 F (36.5 C)  TempSrc: Oral  SpO2: 95%  Weight: 179 lb 12.8 oz (81.6 kg)  Height: 5\' 11"  (1.803 m)   Physical Exam Constitutional:      General: He is not in acute distress. Cardiovascular:     Rate and Rhythm: Normal rate and regular rhythm.     Heart sounds: Normal heart sounds.  Pulmonary:     Effort: Pulmonary effort is normal.     Breath sounds: Normal breath sounds.  Abdominal:     General: There is no distension.  Musculoskeletal:        General: No tenderness or deformity.     Right lower leg: No edema.     Left lower leg: No edema.  Skin:    General: Skin is warm and dry.  Neurological:     Mental Status: He is alert and oriented to person, place, and time.      CMP Latest Ref Rng & Units 10/30/2020  Glucose 70 - 99 mg/dL 113(H)  BUN 8 - 23 mg/dL 25(H)  Creatinine 0.61 - 1.24 mg/dL  1.69(H)  Sodium 135 - 145 mmol/L 141  Potassium 3.5 - 5.1 mmol/L 3.7  Chloride 98 - 111 mmol/L 109  CO2 22 - 32 mmol/L 24  Calcium 8.9 - 10.3 mg/dL 8.9  Total Protein 6.5 - 8.1 g/dL 6.9  Total Bilirubin 0.3 - 1.2 mg/dL 0.4  Alkaline Phos 38 - 126 U/L 60  AST 15 - 41 U/L 15  ALT 0 - 44 U/L 13   CBC Latest Ref Rng & Units 10/30/2020  WBC 4.0 - 10.5 K/uL 5.1  Hemoglobin 13.0 - 17.0 g/dL 12.3(L)  Hematocrit 39.0 - 52.0 % 37.2(L)  Platelets 150 - 400 K/uL 191      Assessment and plan- Patient is a 70 y.o. male withstage IIIc adenocarcinoma of the right upper lobe of the lung cT4 N3 M0.  He is here for on treatment assessment prior to cycle 7 of maintenance durvalumab  Counts okay to proceed with cycle 7 of maintenance durvalumab today.  I will see him back in 2 weeks for cycle 8.  He is scheduled for repeat CT chest abdomen pelvis with contrast as well.  Left hip pain: Likely musculoskeletal.  However I will add a bone scan at this time as well.  Continue as needed oxycodone.  CKD:  Overall stable continue to monitor   Visit Diagnosis 1. Malignant neoplasm of hilus of right lung ([place])   2. Encounter for antineoplastic immunotherapy   3. Left hip pain      Dr. Randa Evens, MD, MPH [facility] [phone] 10/30/2020 1:37 PM

## 2020-11-03 ENCOUNTER — Ambulatory Visit
Admission: RE | Admit: 2020-11-03 | Discharge: 2020-11-03 | Disposition: A | Discharge status: Home / Self Care | Payer: Medicare Other | Source: Ambulatory Visit | Attending: Oncology | Admitting: Oncology

## 2020-11-03 ENCOUNTER — Other Ambulatory Visit: Payer: Self-pay

## 2020-11-03 DIAGNOSIS — C3401 Malignant neoplasm of right main bronchus: Secondary | ICD-10-CM

## 2020-11-03 DIAGNOSIS — Z8551 Personal history of malignant neoplasm of bladder: Secondary | ICD-10-CM | POA: Diagnosis not present

## 2020-11-03 DIAGNOSIS — J90 Pleural effusion, not elsewhere classified: Secondary | ICD-10-CM | POA: Diagnosis not present

## 2020-11-03 DIAGNOSIS — I2510 Atherosclerotic heart disease of native coronary artery without angina pectoris: Secondary | ICD-10-CM | POA: Diagnosis not present

## 2020-11-03 DIAGNOSIS — I700 Atherosclerosis of aorta: Secondary | ICD-10-CM | POA: Diagnosis not present

## 2020-11-03 DIAGNOSIS — C3491 Malignant neoplasm of unspecified part of right bronchus or lung: Secondary | ICD-10-CM | POA: Diagnosis not present

## 2020-11-03 DIAGNOSIS — K8020 Calculus of gallbladder without cholecystitis without obstruction: Secondary | ICD-10-CM | POA: Diagnosis not present

## 2020-11-03 MED ORDER — IOHEXOL 300 MG/ML  SOLN
75.0000 mL | Freq: Once | INTRAMUSCULAR | Status: AC | PRN
  Administered 2020-11-03: 75 mL via INTRAVENOUS

## 2020-11-10 ENCOUNTER — Other Ambulatory Visit: Payer: Self-pay

## 2020-11-10 ENCOUNTER — Encounter
Admission: RE | Admit: 2020-11-10 | Discharge: 2020-11-10 | Disposition: A | Discharge status: Home / Self Care | Payer: Medicare Other | Source: Ambulatory Visit | Attending: Oncology | Admitting: Oncology

## 2020-11-10 DIAGNOSIS — C3401 Malignant neoplasm of right main bronchus: Secondary | ICD-10-CM

## 2020-11-10 DIAGNOSIS — M25562 Pain in left knee: Secondary | ICD-10-CM | POA: Diagnosis not present

## 2020-11-10 DIAGNOSIS — Z8551 Personal history of malignant neoplasm of bladder: Secondary | ICD-10-CM | POA: Diagnosis not present

## 2020-11-10 DIAGNOSIS — Z85118 Personal history of other malignant neoplasm of bronchus and lung: Secondary | ICD-10-CM | POA: Diagnosis not present

## 2020-11-10 DIAGNOSIS — M1612 Unilateral primary osteoarthritis, left hip: Secondary | ICD-10-CM | POA: Diagnosis not present

## 2020-11-10 MED ORDER — TECHNETIUM TC 99M MEDRONATE IV KIT
20.0000 | PACK | Freq: Once | INTRAVENOUS | Status: AC | PRN
  Administered 2020-11-10: 21.096 via INTRAVENOUS

## 2020-11-11 DIAGNOSIS — R0902 Hypoxemia: Secondary | ICD-10-CM | POA: Diagnosis not present

## 2020-11-13 ENCOUNTER — Inpatient Hospital Stay: Payer: Medicare Other

## 2020-11-13 ENCOUNTER — Other Ambulatory Visit: Payer: Self-pay

## 2020-11-13 ENCOUNTER — Inpatient Hospital Stay (HOSPITAL_BASED_OUTPATIENT_CLINIC_OR_DEPARTMENT_OTHER): Payer: Medicare Other | Admitting: Oncology

## 2020-11-13 ENCOUNTER — Encounter: Payer: Self-pay | Admitting: Oncology

## 2020-11-13 VITALS — Temp 96.4°F

## 2020-11-13 VITALS — BP 130/81 | HR 77 | Resp 20 | Wt 181.6 lb

## 2020-11-13 DIAGNOSIS — R7989 Other specified abnormal findings of blood chemistry: Secondary | ICD-10-CM

## 2020-11-13 DIAGNOSIS — N189 Chronic kidney disease, unspecified: Secondary | ICD-10-CM | POA: Diagnosis not present

## 2020-11-13 DIAGNOSIS — Z5111 Encounter for antineoplastic chemotherapy: Secondary | ICD-10-CM | POA: Diagnosis not present

## 2020-11-13 DIAGNOSIS — C3411 Malignant neoplasm of upper lobe, right bronchus or lung: Secondary | ICD-10-CM

## 2020-11-13 DIAGNOSIS — Z87891 Personal history of nicotine dependence: Secondary | ICD-10-CM | POA: Diagnosis not present

## 2020-11-13 DIAGNOSIS — M25552 Pain in left hip: Secondary | ICD-10-CM | POA: Diagnosis not present

## 2020-11-13 DIAGNOSIS — Z5112 Encounter for antineoplastic immunotherapy: Secondary | ICD-10-CM

## 2020-11-13 DIAGNOSIS — C3401 Malignant neoplasm of right main bronchus: Secondary | ICD-10-CM

## 2020-11-13 DIAGNOSIS — I10 Essential (primary) hypertension: Secondary | ICD-10-CM | POA: Diagnosis not present

## 2020-11-13 DIAGNOSIS — Z8551 Personal history of malignant neoplasm of bladder: Secondary | ICD-10-CM | POA: Diagnosis not present

## 2020-11-13 DIAGNOSIS — N1832 Chronic kidney disease, stage 3b: Secondary | ICD-10-CM | POA: Diagnosis not present

## 2020-11-13 DIAGNOSIS — G8929 Other chronic pain: Secondary | ICD-10-CM | POA: Diagnosis not present

## 2020-11-13 DIAGNOSIS — Z923 Personal history of irradiation: Secondary | ICD-10-CM | POA: Diagnosis not present

## 2020-11-13 DIAGNOSIS — Z79899 Other long term (current) drug therapy: Secondary | ICD-10-CM | POA: Diagnosis not present

## 2020-11-13 DIAGNOSIS — Z8616 Personal history of COVID-19: Secondary | ICD-10-CM | POA: Diagnosis not present

## 2020-11-13 LAB — CBC WITH DIFFERENTIAL/PLATELET
Abs Immature Granulocytes: 0.02 10*3/uL (ref 0.00–0.07)
Basophils Absolute: 0 10*3/uL (ref 0.0–0.1)
Basophils Relative: 1 %
Eosinophils Absolute: 0.3 10*3/uL (ref 0.0–0.5)
Eosinophils Relative: 4 %
HCT: 40.4 % (ref 39.0–52.0)
Hemoglobin: 13.2 g/dL (ref 13.0–17.0)
Immature Granulocytes: 0 %
Lymphocytes Relative: 22 %
Lymphs Abs: 1.4 10*3/uL (ref 0.7–4.0)
MCH: 30.3 pg (ref 26.0–34.0)
MCHC: 32.7 g/dL (ref 30.0–36.0)
MCV: 92.9 fL (ref 80.0–100.0)
Monocytes Absolute: 0.6 10*3/uL (ref 0.1–1.0)
Monocytes Relative: 9 %
Neutro Abs: 4.2 10*3/uL (ref 1.7–7.7)
Neutrophils Relative %: 64 %
Platelets: 199 10*3/uL (ref 150–400)
RBC: 4.35 MIL/uL (ref 4.22–5.81)
RDW: 12.1 % (ref 11.5–15.5)
WBC: 6.5 10*3/uL (ref 4.0–10.5)
nRBC: 0 % (ref 0.0–0.2)

## 2020-11-13 LAB — COMPREHENSIVE METABOLIC PANEL
ALT: 13 U/L (ref 0–44)
AST: 15 U/L (ref 15–41)
Albumin: 3.6 g/dL (ref 3.5–5.0)
Alkaline Phosphatase: 55 U/L (ref 38–126)
Anion gap: 10 (ref 5–15)
BUN: 36 mg/dL — ABNORMAL HIGH (ref 8–23)
CO2: 23 mmol/L (ref 22–32)
Calcium: 9.2 mg/dL (ref 8.9–10.3)
Chloride: 107 mmol/L (ref 98–111)
Creatinine, Ser: 1.93 mg/dL — ABNORMAL HIGH (ref 0.61–1.24)
GFR, Estimated: 37 mL/min — ABNORMAL LOW (ref >60)
Glucose, Bld: 105 mg/dL — ABNORMAL HIGH (ref 70–99)
Potassium: 3.7 mmol/L (ref 3.5–5.1)
Sodium: 140 mmol/L (ref 135–145)
Total Bilirubin: 0.4 mg/dL (ref 0.3–1.2)
Total Protein: 7.2 g/dL (ref 6.5–8.1)

## 2020-11-13 LAB — TSH: TSH: 2.153 u[IU]/mL (ref 0.350–4.500)

## 2020-11-13 MED ORDER — SODIUM CHLORIDE 0.9 % IV SOLN
10.0000 mg/kg | Freq: Once | INTRAVENOUS | Status: AC
  Administered 2020-11-13: 860 mg via INTRAVENOUS
  Filled 2020-11-13: qty 10

## 2020-11-13 MED ORDER — SODIUM CHLORIDE 0.9 % IV SOLN: 10.0000 mg/kg | Freq: Once | INTRAVENOUS | Status: DC

## 2020-11-13 MED ORDER — SODIUM CHLORIDE 0.9 % IV SOLN
Freq: Once | INTRAVENOUS | Status: AC
  Administered 2020-11-13: 1000 mL via INTRAVENOUS
  Filled 2020-11-13: qty 250

## 2020-11-13 MED ORDER — HEPARIN SOD (PORK) LOCK FLUSH 100 UNIT/ML IV SOLN
INTRAVENOUS | Status: AC
  Filled 2020-11-13: qty 5

## 2020-11-13 MED ORDER — HEPARIN SOD (PORK) LOCK FLUSH 100 UNIT/ML IV SOLN
500.0000 [IU] | Freq: Once | INTRAVENOUS | Status: AC | PRN
  Administered 2020-11-13: 500 [IU]
  Filled 2020-11-13: qty 5

## 2020-11-13 MED ORDER — SODIUM CHLORIDE 0.9 % IV SOLN
Freq: Once | INTRAVENOUS | Status: AC
  Filled 2020-11-13: qty 250

## 2020-11-13 NOTE — Progress Notes (Signed)
Update dose for weight per md

## 2020-11-13 NOTE — Progress Notes (Signed)
Hematology/Oncology Consult note [facility]  [phone] [phone]  Patient Care Team: Perrin Maltese, MD as PCP - General (Internal Medicine) Telford Nab, RN as Oncology Nurse Navigator Sindy Guadeloupe, MD as Consulting Physician (Hematology and Oncology)   Name of the patient: Martin Jennings  [id number]  1950/09/21   Date of visit: 11/13/20  Diagnosis- stage IIIc adenocarcinoma of the lung cT3 cN3 M0  Chief complaint/ Reason for visit-on treatment assessment prior to cycle 8 of maintenance durvalumab  Heme/Onc history: Patient is a 70 year old male with a history of tobacco dependence who was admitted to the hospital for symptoms of acute onset shortness of breath and was found to have Covid pneumonia. He underwent CT angio chest which showed extensive mediastinal as well as bilateral hilar adenopathy. Masslike opacity with architectural distortion involving the right upper lobe measuring 5.7 x 2.9 x 2.3 cm and groundglass opacities bilaterally. Mass involving the left adrenal gland as well as a smaller right adrenal gland possibly indicating adrenal metastases.  This was followed by a PET CT scan 2 weeks later which showed the consolidative process in the right upper lobesuv7.88 concerning for infiltrating tumor. It appears to involve the pleura associated interstitial thickening was also seen. Bulky right hilar and mediastinal adenopathy. Enlarged somewhat necrotic appearing left supraclavicular lymph node was also hypermetabolic. Adrenal gland was not hypermetabolic and was consistent with a benign adenoma. No metastatic disease involving liver. No features of distant metastatic disease.  Pathology showed metastatic non-small cell lung cancer which was TTF-1 positive consistent with adenocarcinoma  Interval history-patient reports doing well.  He has chronic left hip pain which is unchanged.  Denies any new complaints at  this time  ECOG PS- 1 Pain scale- 3 Opioid associated constipation- no  Review of systems- Review of Systems  Constitutional: Negative for chills, fever, malaise/fatigue and weight loss.  HENT: Negative for congestion, ear discharge and nosebleeds.   Eyes: Negative for blurred vision.  Respiratory: Negative for cough, hemoptysis, sputum production, shortness of breath and wheezing.   Cardiovascular: Negative for chest pain, palpitations, orthopnea and claudication.  Gastrointestinal: Negative for abdominal pain, blood in stool, constipation, diarrhea, heartburn, melena, nausea and vomiting.  Genitourinary: Negative for dysuria, flank pain, frequency, hematuria and urgency.  Musculoskeletal: Negative for back pain, joint pain and myalgias.       Left hip pain  Skin: Negative for rash.  Neurological: Negative for dizziness, tingling, focal weakness, seizures, weakness and headaches.  Endo/Heme/Allergies: Does not bruise/bleed easily.  Psychiatric/Behavioral: Negative for depression and suicidal ideas. The patient does not have insomnia.       No Known Allergies   Past Medical History:  Diagnosis Date  . Bladder cancer ([place]) 2012  . COVID-19 virus infection 04/03/2020  . Hypertension   . Lung cancer ([place])   . Peripheral vascular disease [facility])      Past Surgical History:  Procedure Laterality Date  . BLADDER REMOVAL    . LOWER EXTREMITY ANGIOGRAPHY Left 04/12/2017   Procedure: Lower Extremity Angiography;  Surgeon: Katha Cabal, MD;  Location: [place] [facility];  Service: Cardiovascular;  Laterality: Left;  . PORTA CATH INSERTION N/A 05/22/2020   Procedure: PORTA CATH INSERTION;  Surgeon: Algernon Huxley, MD;  Location: [place] [facility];  Service: Cardiovascular;  Laterality: N/A;  . uretostomy    . VASCULAR SURGERY      Social History   Socioeconomic History  . Marital status: Married  Spouse name: Not on file  . Number of children: Not on file  . Years of  education: Not on file  . Highest education level: Not on file  Occupational History  . Not on file  Tobacco Use  . Smoking status: Former Smoker    Packs/day: 1.25    Years: 50.00    Pack years: 62.50    Types: Cigarettes    Quit date: 03/13/2020    Years since quitting: 0.6  . Smokeless tobacco: Never Used  Vaping Use  . Vaping Use: Never used  Substance and Sexual Activity  . Alcohol use: No  . Drug use: Not Currently    Types: Marijuana  . Sexual activity: Not Currently  Other Topics Concern  . Not on file  Social History Narrative  . Not on file   Social Determinants of Health   Financial Resource Strain: Not on file  Food Insecurity: Not on file  Transportation Needs: Not on file  Physical Activity: Not on file  Stress: Not on file  Social Connections: Not on file  Intimate Partner Violence: Not on file    History reviewed. No pertinent family history.   Current Outpatient Medications:  .  amLODipine (NORVASC) 10 MG tablet, Take 10 mg by mouth daily. , Disp: , Rfl:  .  atorvastatin (LIPITOR) 20 MG tablet, Take 20 mg by mouth every evening. , Disp: , Rfl:  .  carvedilol (COREG) 6.25 MG tablet, Take 6.25 mg by mouth 2 (two) times daily., Disp: , Rfl:  .  clopidogrel (PLAVIX) 75 MG tablet, Take 75 mg by mouth daily. , Disp: , Rfl:  .  oxyCODONE (OXY IR/ROXICODONE) 5 MG immediate release tablet, TAKE 1 TABLET BY MOUTH EVERY 8 HOURS AS NEEDED FOR SEVERE PAIN, Disp: 30 tablet, Rfl: 0 .  magic mouthwash SOLN, Take 5 mLs by mouth 4 (four) times daily as needed for mouth pain. (Patient not taking: No sig reported), Disp: 240 mL, Rfl: 0 No current facility-administered medications for this visit.  Facility-Administered Medications Ordered in Other Visits:  .  sodium chloride flush (NS) 0.9 % injection 10 mL, 10 mL, Intravenous, PRN, Sindy Guadeloupe, MD, 10 mL at 09/18/20 0850  Physical exam:  Vitals:   11/13/20 0953  BP: 130/81  Pulse: 77  Resp: 20  SpO2: 97%   Weight: 181 lb 9.6 oz (82.4 kg)   Physical Exam Constitutional:      General: He is not in acute distress. Cardiovascular:     Rate and Rhythm: Normal rate and regular rhythm.     Heart sounds: Normal heart sounds.  Pulmonary:     Effort: Pulmonary effort is normal.     Breath sounds: Normal breath sounds.  Abdominal:     General: Bowel sounds are normal.     Palpations: Abdomen is soft.  Skin:    General: Skin is warm and dry.  Neurological:     Mental Status: He is alert and oriented to person, place, and time.      CMP Latest Ref Rng & Units 11/13/2020  Glucose 70 - 99 mg/dL 105(H)  BUN 8 - 23 mg/dL 36(H)  Creatinine 0.61 - 1.24 mg/dL 1.93(H)  Sodium 135 - 145 mmol/L 140  Potassium 3.5 - 5.1 mmol/L 3.7  Chloride 98 - 111 mmol/L 107  CO2 22 - 32 mmol/L 23  Calcium 8.9 - 10.3 mg/dL 9.2  Total Protein 6.5 - 8.1 g/dL 7.2  Total Bilirubin 0.3 - 1.2 mg/dL 0.4  Alkaline Phos 38 - 126 U/L 55  AST 15 - 41 U/L 15  ALT 0 - 44 U/L 13   CBC Latest Ref Rng & Units 11/13/2020  WBC 4.0 - 10.5 K/uL 6.5  Hemoglobin 13.0 - 17.0 g/dL 13.2  Hematocrit 39.0 - 52.0 % 40.4  Platelets 150 - 400 K/uL 199    No images are attached to the encounter.  NM Bone Scan Whole Body  Result Date: 11/11/2020 CLINICAL DATA:  History of lung and bladder carcinoma. Left hip pain. EXAM: NUCLEAR MEDICINE WHOLE BODY BONE SCAN TECHNIQUE: Whole body anterior and posterior images were obtained approximately 3 hours after intravenous injection of radiopharmaceutical. RADIOPHARMACEUTICALS:  21.1 mCi Technetium-73m MDP IV COMPARISON:  CT chest, abdomen and pelvis 11/03/2020. FINDINGS: No osseous uptake to suggest metastatic disease identified. There is scattered degenerative change about the feet and knee. Small foci of increased uptake in the midline of the proximal right and left tibia is seen. Soft tissue uptake is negative. IMPRESSION: Negative for metastatic disease. Punctate foci of increased uptake in the  proximal right and left tibia may be due to old Osgood-Schlatter disease or possibly stress change. Electronically Signed   By: Inge Rise M.D.   On: 11/11/2020 10:00   CT CHEST ABDOMEN PELVIS W CONTRAST  Result Date: 11/04/2020 CLINICAL DATA:  RIGHT lung cancer. Diagnosis September 2021. Radiation therapy complete. Ongoing chemotherapy. Additional history of bladder cancer with urostomy. EXAM: CT CHEST, ABDOMEN, AND PELVIS WITH CONTRAST TECHNIQUE: Multidetector CT imaging of the chest, abdomen and pelvis was performed following the standard protocol during bolus administration of intravenous contrast. CONTRAST:  66mL OMNIPAQUE IOHEXOL 300 MG/ML  SOLN COMPARISON:  CT 07/24/2020, PET-CT 04/22/2020 FINDINGS: CT CHEST FINDINGS Cardiovascular: Coronary artery calcification and aortic atherosclerotic calcification. Mediastinum/Nodes: RIGHT lower paratracheal node measuring 17 mm decreased from 26 mm. LEFT lower paratracheal/AP window node measures 7 mm decreased from 8 mm. Subcarinal node measures 8 mm decreased from 11 mm. No new adenopathy. Lungs/Pleura: Moderate layering RIGHT pleural effusion is slightly increased from prior. Peripheral band of consolidation is improved compared to prior. No discrete measurable nodularity. Centrilobular emphysema the upper lobes. LEFT lung clear of nodularity. Subpleural reticulation within both lungs. Musculoskeletal: No aggressive osseous lesion. CT ABDOMEN AND PELVIS FINDINGS Hepatobiliary: No focal hepatic lesion.  Small gallstone. Pancreas: Pancreas is normal. No ductal dilatation. No pancreatic inflammation. Spleen: Normal spleen Adrenals/urinary tract: Thickened LEFT adrenal gland consistent benign adenoma as demonstrated on comparison noncontrast exam Kidneys and ureters are normal. If conduit anatomy. Post cystectomy without evidence of bladder cancer recurrence in the pelvis. Stomach/Bowel: Stomach, small bowel cecum normal. Appendix normal. Ascending, transverse  and descending colon normal. Rectum normal. Vascular/Lymphatic: Abdominal aorta is normal caliber with atherosclerotic calcification. There is no retroperitoneal or periportal lymphadenopathy. No pelvic lymphadenopathy. Reproductive: Post prostatectomy Other: Fem-fem bypass noted Musculoskeletal: No aggressive osseous lesion. IMPRESSION: Chest Impression: 1. Interval improvement band like peripheral pleuroparenchymal consolidation in the RIGHT upper lobe suggesting improving radiation change. 2. No measurable nodularity in the RIGHT lung. 3. Increase in moderate RIGHT effusion. 4. Interval decrease in size of mediastinal lymph nodes. Abdomen / Pelvis Impression: 1. No evidence lung cancer metastasis in the abdomen pelvis. 2. Post cystectomy with ileal conduit anatomy. No complicating features. 3. No skeletal metastasis. Electronically Signed   By: Suzy Bouchard M.D.   On: 11/04/2020 16:05     Assessment and plan- Patient is a 70 y.o. male withstage IIIc adenocarcinoma of the right upper lobe of the lung  cT4 N3 M0.   He is here for on treatment assessment prior to cycle 8 of maintenance durvalumab  Counts okay to proceed with cycle 8 of maintenance durvalumab today.  I will see him back in 2 weeks for cycle 9.  I have reviewed CT chest abdomen and pelvis images independently and discussed findings with the patient.  Overall patient continues to respond well to maintenance immunotherapy.  There is continued shrinkage in the right upper lobe consolidation and mass.  Decrease in the size of mediastinal lymph nodes.  There was moderate right-sided pleural effusion noted.  However patient does not report any worsening shortness of breath.  Lung sounds are normal bilaterally.  We will hold off on any thoracentesis at this time.  If patient has worsening shortness of breath we will plan to get a chest x-ray followed by possible thoracentesis  Left hip pain: Possibly secondary to osteoarthritis.  Bone scan did  not reveal any evidence of osseous metastatic lesion   Visit Diagnosis 1. Encounter for antineoplastic immunotherapy   2. Stage 3b chronic kidney disease ([place])   3. Malignant neoplasm of upper lobe of right lung [facility])      Dr. Randa Evens, MD, MPH [facility] [phone] 11/13/2020 3:01 PM

## 2020-11-13 NOTE — Progress Notes (Signed)
Creatinine: 1.93. MD, Dr. Janese Banks, notified and already aware. Per MD order: proceed with scheduled Imfinzi treatment today; MD to place orders for 0.9% Sodium Chloride infusion to be given today as well.

## 2020-11-27 ENCOUNTER — Encounter: Payer: Self-pay | Admitting: Oncology

## 2020-11-27 ENCOUNTER — Inpatient Hospital Stay: Payer: Medicare Other | Attending: Oncology

## 2020-11-27 ENCOUNTER — Other Ambulatory Visit: Payer: Self-pay

## 2020-11-27 ENCOUNTER — Inpatient Hospital Stay: Payer: Medicare Other

## 2020-11-27 ENCOUNTER — Inpatient Hospital Stay (HOSPITAL_BASED_OUTPATIENT_CLINIC_OR_DEPARTMENT_OTHER): Payer: Medicare Other | Admitting: Oncology

## 2020-11-27 VITALS — BP 141/73 | HR 80 | Temp 97.6°F | Resp 18 | Wt 185.0 lb

## 2020-11-27 DIAGNOSIS — Z79899 Other long term (current) drug therapy: Secondary | ICD-10-CM | POA: Diagnosis not present

## 2020-11-27 DIAGNOSIS — C3411 Malignant neoplasm of upper lobe, right bronchus or lung: Secondary | ICD-10-CM | POA: Insufficient documentation

## 2020-11-27 DIAGNOSIS — C3401 Malignant neoplasm of right main bronchus: Secondary | ICD-10-CM

## 2020-11-27 DIAGNOSIS — Z5112 Encounter for antineoplastic immunotherapy: Secondary | ICD-10-CM

## 2020-11-27 DIAGNOSIS — Z8616 Personal history of COVID-19: Secondary | ICD-10-CM | POA: Diagnosis not present

## 2020-11-27 DIAGNOSIS — Z87891 Personal history of nicotine dependence: Secondary | ICD-10-CM | POA: Diagnosis not present

## 2020-11-27 DIAGNOSIS — R2232 Localized swelling, mass and lump, left upper limb: Secondary | ICD-10-CM

## 2020-11-27 DIAGNOSIS — I10 Essential (primary) hypertension: Secondary | ICD-10-CM | POA: Diagnosis not present

## 2020-11-27 DIAGNOSIS — Z95828 Presence of other vascular implants and grafts: Secondary | ICD-10-CM

## 2020-11-27 LAB — CBC WITH DIFFERENTIAL/PLATELET
Abs Immature Granulocytes: 0.03 10*3/uL (ref 0.00–0.07)
Basophils Absolute: 0 10*3/uL (ref 0.0–0.1)
Basophils Relative: 0 %
Eosinophils Absolute: 0.3 10*3/uL (ref 0.0–0.5)
Eosinophils Relative: 4 %
HCT: 39 % (ref 39.0–52.0)
Hemoglobin: 13.1 g/dL (ref 13.0–17.0)
Immature Granulocytes: 0 %
Lymphocytes Relative: 20 %
Lymphs Abs: 1.4 10*3/uL (ref 0.7–4.0)
MCH: 30.7 pg (ref 26.0–34.0)
MCHC: 33.6 g/dL (ref 30.0–36.0)
MCV: 91.3 fL (ref 80.0–100.0)
Monocytes Absolute: 0.7 10*3/uL (ref 0.1–1.0)
Monocytes Relative: 9 %
Neutro Abs: 4.7 10*3/uL (ref 1.7–7.7)
Neutrophils Relative %: 67 %
Platelets: 180 10*3/uL (ref 150–400)
RBC: 4.27 MIL/uL (ref 4.22–5.81)
RDW: 12.3 % (ref 11.5–15.5)
WBC: 7.1 10*3/uL (ref 4.0–10.5)
nRBC: 0 % (ref 0.0–0.2)

## 2020-11-27 LAB — COMPREHENSIVE METABOLIC PANEL
ALT: 12 U/L (ref 0–44)
AST: 14 U/L — ABNORMAL LOW (ref 15–41)
Albumin: 3.4 g/dL — ABNORMAL LOW (ref 3.5–5.0)
Alkaline Phosphatase: 58 U/L (ref 38–126)
Anion gap: 8 (ref 5–15)
BUN: 26 mg/dL — ABNORMAL HIGH (ref 8–23)
CO2: 23 mmol/L (ref 22–32)
Calcium: 8.9 mg/dL (ref 8.9–10.3)
Chloride: 107 mmol/L (ref 98–111)
Creatinine, Ser: 1.87 mg/dL — ABNORMAL HIGH (ref 0.61–1.24)
GFR, Estimated: 38 mL/min — ABNORMAL LOW (ref >60)
Glucose, Bld: 134 mg/dL — ABNORMAL HIGH (ref 70–99)
Potassium: 3.5 mmol/L (ref 3.5–5.1)
Sodium: 138 mmol/L (ref 135–145)
Total Bilirubin: 0.6 mg/dL (ref 0.3–1.2)
Total Protein: 6.9 g/dL (ref 6.5–8.1)

## 2020-11-27 MED ORDER — SODIUM CHLORIDE 0.9% FLUSH
10.0000 mL | Freq: Once | INTRAVENOUS | Status: AC
  Administered 2020-11-27: 10 mL via INTRAVENOUS
  Filled 2020-11-27: qty 10

## 2020-11-27 MED ORDER — HEPARIN SOD (PORK) LOCK FLUSH 100 UNIT/ML IV SOLN
500.0000 [IU] | Freq: Once | INTRAVENOUS | Status: AC | PRN
  Administered 2020-11-27: 500 [IU]
  Filled 2020-11-27: qty 5

## 2020-11-27 MED ORDER — SODIUM CHLORIDE 0.9 % IV SOLN
Freq: Once | INTRAVENOUS | Status: AC
  Filled 2020-11-27: qty 250

## 2020-11-27 MED ORDER — DURVALUMAB 500 MG/10ML IV SOLN
10.0000 mg/kg | Freq: Once | INTRAVENOUS | Status: AC
  Administered 2020-11-27: 860 mg via INTRAVENOUS
  Filled 2020-11-27: qty 10

## 2020-11-27 MED ORDER — HEPARIN SOD (PORK) LOCK FLUSH 100 UNIT/ML IV SOLN
500.0000 [IU] | Freq: Once | INTRAVENOUS | Status: DC
  Filled 2020-11-27: qty 5

## 2020-11-27 NOTE — Patient Instructions (Signed)
[facility]  Discharge Instructions: Thank you for choosing [facility] to provide your oncology and hematology care.  If you have a lab appointment with the [facility], please go directly to the [facility] and check in at the registration area.  Wear comfortable clothing and clothing appropriate for easy access to any Portacath or PICC line.   We strive to give you quality time with your provider. You may need to reschedule your appointment if you arrive late (15 or more minutes).  Arriving late affects you and other patients whose appointments are after yours.  Also, if you miss three or more appointments without notifying the office, you may be dismissed from the clinic at the provider's discretion.      For prescription refill requests, have your pharmacy contact our office and allow 72 hours for refills to be completed.    Today you received the following chemotherapy and/or immunotherapy agents Durvalumab      To help prevent nausea and vomiting after your treatment, we encourage you to take your nausea medication as directed.  BELOW ARE SYMPTOMS THAT SHOULD BE REPORTED IMMEDIATELY: . *FEVER GREATER THAN 100.4 F (38 C) OR HIGHER . *CHILLS OR SWEATING . *NAUSEA AND VOMITING THAT IS NOT CONTROLLED WITH YOUR NAUSEA MEDICATION . *UNUSUAL SHORTNESS OF BREATH . *UNUSUAL BRUISING OR BLEEDING . *URINARY PROBLEMS (pain or burning when urinating, or frequent urination) . *BOWEL PROBLEMS (unusual diarrhea, constipation, pain near the anus) . TENDERNESS IN MOUTH AND THROAT WITH OR WITHOUT PRESENCE OF ULCERS (sore throat, sores in mouth, or a toothache) . UNUSUAL RASH, SWELLING OR PAIN  . UNUSUAL VAGINAL DISCHARGE OR ITCHING   Items with * indicate a potential emergency and should be followed up as soon as possible or go to the Emergency Department if any problems should occur.  Please show the CHEMOTHERAPY ALERT CARD or IMMUNOTHERAPY  ALERT CARD at check-in to the Emergency Department and triage nurse.  Should you have questions after your visit or need to cancel or reschedule your appointment, please contact [facility]  [phone] and follow the prompts.  Office hours are 8:00 a.m. to 4:30 p.m. Monday - Friday. Please note that voicemails left after 4:00 p.m. may not be returned until the following business day.  We are closed weekends and major holidays. You have access to a nurse at all times for urgent questions. Please call the main number to the clinic [phone] and follow the prompts.  For any non-urgent questions, you may also contact your provider using MyChart. We now offer e-Visits for anyone 34 and older to request care online for non-urgent symptoms. For details visit [email or web address].   Also download the MyChart app! Go to the app store, search "MyChart", open the app, select [facility], and log in with your MyChart username and password.  Due to Covid, a mask is required upon entering the hospital/clinic. If you do not have a mask, one will be given to you upon arrival. For doctor visits, patients may have 1 support person aged 64 or older with them. For treatment visits, patients cannot have anyone with them due to current Covid guidelines and our immunocompromised population. Durvalumab injection What is this medicine? DURVALUMAB (dur VAL ue mab) is a monoclonal antibody. It is used to treat lung cancer. This medicine may be used for other purposes; ask your health care provider or pharmacist if you have questions. COMMON BRAND NAME(S): [email or web address] What should  I tell my health care provider before I take this medicine? They need to know if you have any of these conditions:  autoimmune diseases like Crohn's disease, ulcerative colitis, or lupus  have had or planning to have an allogeneic stem cell transplant (uses someone else's stem cells)  history of organ  transplant  history of radiation to the chest  nervous system problems like myasthenia gravis or Guillain-Barre syndrome  an unusual or allergic reaction to durvalumab, other medicines, foods, dyes, or preservatives  pregnant or trying to get pregnant  breast-feeding How should I use this medicine? This medicine is for infusion into a vein. It is given by a health care professional in a hospital or clinic setting. A special MedGuide will be given to you before each treatment. Be sure to read this information carefully each time. Talk to your pediatrician regarding the use of this medicine in children. Special care may be needed. Overdosage: If you think you have taken too much of this medicine contact a poison control center or emergency room at once. NOTE: This medicine is only for you. Do not share this medicine with others. What if I miss a dose? It is important not to miss your dose. Call your doctor or health care professional if you are unable to keep an appointment. What may interact with this medicine? Interactions have not been studied. This list may not describe all possible interactions. Give your health care provider a list of all the medicines, herbs, non-prescription drugs, or dietary supplements you use. Also tell them if you smoke, drink alcohol, or use illegal drugs. Some items may interact with your medicine. What should I watch for while using this medicine? This drug may make you feel generally unwell. Continue your course of treatment even though you feel ill unless your doctor tells you to stop. You may need blood work done while you are taking this medicine. Do not become pregnant while taking this medicine or for 3 months after stopping it. Women should inform their doctor if they wish to become pregnant or think they might be pregnant. There is a potential for serious side effects to an unborn child. Talk to your health care professional or pharmacist for more  information. Do not breast-feed an infant while taking this medicine or for 3 months after stopping it. What side effects may I notice from receiving this medicine? Side effects that you should report to your doctor or health care professional as soon as possible:  allergic reactions like skin rash, itching or hives, swelling of the face, lips, or tongue  black, tarry stools  bloody or watery diarrhea  breathing problems  change in emotions or moods  change in sex drive  changes in vision  chest pain or chest tightness  chills  confusion  cough  facial flushing  fever  headache  signs and symptoms of high blood sugar such as dizziness; dry mouth; dry skin; fruity breath; nausea; stomach pain; increased hunger or thirst; increased urination  signs and symptoms of liver injury like dark yellow or brown urine; general ill feeling or flu-like symptoms; light-colored stools; loss of appetite; nausea; right upper belly pain; unusually weak or tired; yellowing of the eyes or skin  stomach pain  trouble passing urine or change in the amount of urine  weight gain or weight loss Side effects that usually do not require medical attention (report these to your doctor or health care professional if they continue or are bothersome):  bone  pain  constipation  loss of appetite  muscle pain  nausea  swelling of the ankles, feet, hands  tiredness This list may not describe all possible side effects. Call your doctor for medical advice about side effects. You may report side effects to FDA at 1-800-FDA-1088. Where should I keep my medicine? This drug is given in a hospital or clinic and will not be stored at home. NOTE: This sheet is a summary. It may not cover all possible information. If you have questions about this medicine, talk to your doctor, pharmacist, or health care provider.  2021 Elsevier/Gold Standard (2019-09-20 13:01:29)

## 2020-11-27 NOTE — Progress Notes (Signed)
Hematology/Oncology Consult note [facility]  Telephone:([phone] [phone]  Patient Care Team: Perrin Maltese, MD as PCP - General (Internal Medicine) Telford Nab, RN as Oncology Nurse Navigator Sindy Guadeloupe, MD as Consulting Physician (Hematology and Oncology)   Name of the patient: Martin Jennings  [id number]  1951-07-06   Date of visit: 11/27/20  Diagnosis- stage IIIc adenocarcinoma of the lung cT3 cN3 M0  Chief complaint/ Reason for visit- on treatment assessment prior to cycle 9 of maintenance durvalumab  Heme/Onc history: Patient is a 70 year old male with a history of tobacco dependence who was admitted to the hospital for symptoms of acute onset shortness of breath and was found to have Covid pneumonia. He underwent CT angio chest which showed extensive mediastinal as well as bilateral hilar adenopathy. Masslike opacity with architectural distortion involving the right upper lobe measuring 5.7 x 2.9 x 2.3 cm and groundglass opacities bilaterally. Mass involving the left adrenal gland as well as a smaller right adrenal gland possibly indicating adrenal metastases.  This was followed by a PET CT scan 2 weeks later which showed the consolidative process in the right upper lobesuv7.88 concerning for infiltrating tumor. It appears to involve the pleura associated interstitial thickening was also seen. Bulky right hilar and mediastinal adenopathy. Enlarged somewhat necrotic appearing left supraclavicular lymph node was also hypermetabolic. Adrenal gland was not hypermetabolic and was consistent with a benign adenoma. No metastatic disease involving liver. No features of distant metastatic disease.  Pathology showed metastatic non-small cell lung cancer which was TTF-1 positive consistent with adenocarcinoma   Interval history- reports that he developed a swelling over his left elbow over the last 1 week. It is not painful  ECOG  PS- 1 Pain scale- 0 Opioid associated constipation- no  Review of systems- Review of Systems  Constitutional: Negative for chills, fever, malaise/fatigue and weight loss.  HENT: Negative for congestion, ear discharge and nosebleeds.   Eyes: Negative for blurred vision.  Respiratory: Negative for cough, hemoptysis, sputum production, shortness of breath and wheezing.   Cardiovascular: Negative for chest pain, palpitations, orthopnea and claudication.  Gastrointestinal: Negative for abdominal pain, blood in stool, constipation, diarrhea, heartburn, melena, nausea and vomiting.  Genitourinary: Negative for dysuria, flank pain, frequency, hematuria and urgency.  Musculoskeletal: Negative for back pain, joint pain and myalgias.  Skin: Negative for rash.  Neurological: Negative for dizziness, tingling, focal weakness, seizures, weakness and headaches.  Endo/Heme/Allergies: Does not bruise/bleed easily.  Psychiatric/Behavioral: Negative for depression and suicidal ideas. The patient does not have insomnia.       No Known Allergies   Past Medical History:  Diagnosis Date  . Bladder cancer ([place]) 2012  . COVID-19 virus infection 04/03/2020  . Hypertension   . Lung cancer ([place])   . Peripheral vascular disease [facility])      Past Surgical History:  Procedure Laterality Date  . BLADDER REMOVAL    . LOWER EXTREMITY ANGIOGRAPHY Left 04/12/2017   Procedure: Lower Extremity Angiography;  Surgeon: Katha Cabal, MD;  Location: [facility];  Service: Cardiovascular;  Laterality: Left;  . PORTA CATH INSERTION N/A 05/22/2020   Procedure: PORTA CATH INSERTION;  Surgeon: Algernon Huxley, MD;  Location: [facility];  Service: Cardiovascular;  Laterality: N/A;  . uretostomy    . VASCULAR SURGERY      Social History   Socioeconomic History  . Marital status: Married    Spouse name: Not on file  . Number  of children: Not on file  . Years of education: Not on file  . Highest  education level: Not on file  Occupational History  . Not on file  Tobacco Use  . Smoking status: Former Smoker    Packs/day: 1.25    Years: 50.00    Pack years: 62.50    Types: Cigarettes    Quit date: 03/13/2020    Years since quitting: 0.7  . Smokeless tobacco: Never Used  Vaping Use  . Vaping Use: Never used  Substance and Sexual Activity  . Alcohol use: No  . Drug use: Not Currently    Types: Marijuana  . Sexual activity: Not Currently  Other Topics Concern  . Not on file  Social History Narrative  . Not on file   Social Determinants of Health   Financial Resource Strain: Not on file  Food Insecurity: Not on file  Transportation Needs: Not on file  Physical Activity: Not on file  Stress: Not on file  Social Connections: Not on file  Intimate Partner Violence: Not on file    No family history on file.   Current Outpatient Medications:  .  amLODipine (NORVASC) 10 MG tablet, Take 10 mg by mouth daily. , Disp: , Rfl:  .  atorvastatin (LIPITOR) 20 MG tablet, Take 20 mg by mouth every evening. , Disp: , Rfl:  .  carvedilol (COREG) 6.25 MG tablet, Take 6.25 mg by mouth 2 (two) times daily., Disp: , Rfl:  .  clopidogrel (PLAVIX) 75 MG tablet, Take 75 mg by mouth daily. , Disp: , Rfl:  .  magic mouthwash SOLN, Take 5 mLs by mouth 4 (four) times daily as needed for mouth pain. (Patient not taking: No sig reported), Disp: 240 mL, Rfl: 0 .  oxyCODONE (OXY IR/ROXICODONE) 5 MG immediate release tablet, TAKE 1 TABLET BY MOUTH EVERY 8 HOURS AS NEEDED FOR SEVERE PAIN, Disp: 30 tablet, Rfl: 0 No current facility-administered medications for this visit.  Facility-Administered Medications Ordered in Other Visits:  .  heparin lock flush 100 unit/mL, 500 Units, Intracatheter, Once PRN, Sindy Guadeloupe, MD .  sodium chloride flush (NS) 0.9 % injection 10 mL, 10 mL, Intravenous, PRN, Sindy Guadeloupe, MD, 10 mL at 09/18/20 0850  Physical exam:  Vitals:   11/27/20 0922  BP: (!) 141/73   Pulse: 80  Resp: 18  Temp: 97.6 F (36.4 C)  TempSrc: Tympanic  SpO2: 97%  Weight: 185 lb (83.9 kg)   Physical Exam Constitutional:      General: He is not in acute distress. Cardiovascular:     Rate and Rhythm: Normal rate and regular rhythm.     Heart sounds: Normal heart sounds.  Pulmonary:     Effort: Pulmonary effort is normal.     Breath sounds: Normal breath sounds.  Abdominal:     General: Bowel sounds are normal.     Palpations: Abdomen is soft.  Musculoskeletal:     Comments: 2 cm round hard nodule seen over the left elbow and appears to be fixed to underlying tissue or  Skin:    General: Skin is warm and dry.  Neurological:     Mental Status: He is alert and oriented to person, place, and time.      CMP Latest Ref Rng & Units 11/27/2020  Glucose 70 - 99 mg/dL 134(H)  BUN 8 - 23 mg/dL 26(H)  Creatinine 0.61 - 1.24 mg/dL 1.87(H)  Sodium 135 - 145 mmol/L 138  Potassium 3.5 -  5.1 mmol/L 3.5  [facility] 98 - 111 mmol/L 107  CO2 22 - 32 mmol/L 23  Calcium 8.9 - 10.3 mg/dL 8.9  Total Protein 6.5 - 8.1 g/dL 6.9  Total Bilirubin 0.3 - 1.2 mg/dL 0.6  Alkaline Phos 38 - 126 U/L 58  AST 15 - 41 U/L 14(L)  ALT 0 - 44 U/L 12   CBC Latest Ref Rng & Units 11/27/2020  WBC 4.0 - 10.5 K/uL 7.1  Hemoglobin 13.0 - 17.0 g/dL 13.1  Hematocrit 39.0 - 52.0 % 39.0  Platelets 150 - 400 K/uL 180      NM Bone Scan Whole Body  Result Date: 11/11/2020 CLINICAL DATA:  History of lung and bladder carcinoma. Left hip pain. EXAM: NUCLEAR MEDICINE WHOLE BODY BONE SCAN TECHNIQUE: Whole body anterior and posterior images were obtained approximately 3 hours after intravenous injection of radiopharmaceutical. RADIOPHARMACEUTICALS:  21.1 mCi Technetium-68m MDP IV COMPARISON:  CT chest, abdomen and pelvis 11/03/2020. FINDINGS: No osseous uptake to suggest metastatic disease identified. There is scattered degenerative change about the feet and knee. Small foci of increased uptake in the midline of  the proximal right and left tibia is seen. Soft tissue uptake is negative. IMPRESSION: Negative for metastatic disease. Punctate foci of increased uptake in the proximal right and left tibia may be due to old Osgood-Schlatter disease or possibly stress change. Electronically Signed   By: Inge Rise M.D.   On: 11/11/2020 10:00   CT CHEST ABDOMEN PELVIS W CONTRAST  Result Date: 11/04/2020 CLINICAL DATA:  RIGHT lung cancer. Diagnosis September 2021. Radiation therapy complete. Ongoing chemotherapy. Additional history of bladder cancer with urostomy. EXAM: CT CHEST, ABDOMEN, AND PELVIS WITH CONTRAST TECHNIQUE: Multidetector CT imaging of the chest, abdomen and pelvis was performed following the standard protocol during bolus administration of intravenous contrast. CONTRAST:  22mL OMNIPAQUE IOHEXOL 300 MG/ML  SOLN COMPARISON:  CT 07/24/2020, PET-CT 04/22/2020 FINDINGS: CT CHEST FINDINGS Cardiovascular: Coronary artery calcification and aortic atherosclerotic calcification. Mediastinum/Nodes: RIGHT lower paratracheal node measuring 17 mm decreased from 26 mm. LEFT lower paratracheal/AP window node measures 7 mm decreased from 8 mm. Subcarinal node measures 8 mm decreased from 11 mm. No new adenopathy. Lungs/Pleura: Moderate layering RIGHT pleural effusion is slightly increased from prior. Peripheral band of consolidation is improved compared to prior. No discrete measurable nodularity. Centrilobular emphysema the upper lobes. LEFT lung clear of nodularity. Subpleural reticulation within both lungs. Musculoskeletal: No aggressive osseous lesion. CT ABDOMEN AND PELVIS FINDINGS Hepatobiliary: No focal hepatic lesion.  Small gallstone. Pancreas: Pancreas is normal. No ductal dilatation. No pancreatic inflammation. Spleen: Normal spleen Adrenals/urinary tract: Thickened LEFT adrenal gland consistent benign adenoma as demonstrated on comparison noncontrast exam Kidneys and ureters are normal. If conduit anatomy. Post  cystectomy without evidence of bladder cancer recurrence in the pelvis. Stomach/Bowel: Stomach, small bowel cecum normal. Appendix normal. Ascending, transverse and descending colon normal. Rectum normal. Vascular/Lymphatic: Abdominal aorta is normal caliber with atherosclerotic calcification. There is no retroperitoneal or periportal lymphadenopathy. No pelvic lymphadenopathy. Reproductive: Post prostatectomy Other: Fem-fem bypass noted Musculoskeletal: No aggressive osseous lesion. IMPRESSION: Chest Impression: 1. Interval improvement band like peripheral pleuroparenchymal consolidation in the RIGHT upper lobe suggesting improving radiation change. 2. No measurable nodularity in the RIGHT lung. 3. Increase in moderate RIGHT effusion. 4. Interval decrease in size of mediastinal lymph nodes. Abdomen / Pelvis Impression: 1. No evidence lung cancer metastasis in the abdomen pelvis. 2. Post cystectomy with ileal conduit anatomy. No complicating features. 3. No skeletal metastasis. Electronically Signed  By: Suzy Bouchard M.D.   On: 11/04/2020 16:05     Assessment and plan- Patient is a 70 y.o. male withstage IIIc adenocarcinoma of the right upper lobe of the lung cT4 N3 M0.  he is here for on treatment assessment prior to cycle 9 of maintenance durvalumab  Counts okay to proceed with cycle 9 of maintenance durvalumab today.  Return to clinic in 2 weeks.  Port labs CBC with differential, CMP and see Dr. Janese Banks.  Gets durvalumab  Patient has a firm to hard swelling over his left elbow which is new and appears fixed.  Unclear if it is a soft tissue lesion versus a bone lesion.  I will proceed with ultrasound at this time for further characterization.   Visit Diagnosis 1. Malignant neoplasm of hilus of right lung ([facility])   2. Skin lump of arm, left      Dr. Randa Evens, MD, MPH [facility] [phone] 11/27/2020 12:31 PM

## 2020-12-01 ENCOUNTER — Other Ambulatory Visit: Payer: Self-pay

## 2020-12-01 ENCOUNTER — Ambulatory Visit
Admission: RE | Admit: 2020-12-01 | Discharge: 2020-12-01 | Disposition: A | Discharge status: Home / Self Care | Payer: Medicare Other | Source: Ambulatory Visit | Attending: Oncology | Admitting: Oncology

## 2020-12-01 DIAGNOSIS — R2232 Localized swelling, mass and lump, left upper limb: Secondary | ICD-10-CM | POA: Diagnosis not present

## 2020-12-01 DIAGNOSIS — C3401 Malignant neoplasm of right main bronchus: Secondary | ICD-10-CM | POA: Diagnosis not present

## 2020-12-01 DIAGNOSIS — I739 Peripheral vascular disease, unspecified: Secondary | ICD-10-CM | POA: Diagnosis not present

## 2020-12-01 DIAGNOSIS — R7303 Prediabetes: Secondary | ICD-10-CM | POA: Diagnosis not present

## 2020-12-01 DIAGNOSIS — D513 Other dietary vitamin B12 deficiency anemia: Secondary | ICD-10-CM | POA: Diagnosis not present

## 2020-12-01 DIAGNOSIS — D519 Vitamin B12 deficiency anemia, unspecified: Secondary | ICD-10-CM | POA: Diagnosis not present

## 2020-12-01 DIAGNOSIS — E559 Vitamin D deficiency, unspecified: Secondary | ICD-10-CM | POA: Diagnosis not present

## 2020-12-01 DIAGNOSIS — E782 Mixed hyperlipidemia: Secondary | ICD-10-CM | POA: Diagnosis not present

## 2020-12-01 DIAGNOSIS — E039 Hypothyroidism, unspecified: Secondary | ICD-10-CM | POA: Diagnosis not present

## 2020-12-01 DIAGNOSIS — I10 Essential (primary) hypertension: Secondary | ICD-10-CM | POA: Diagnosis not present

## 2020-12-01 DIAGNOSIS — N189 Chronic kidney disease, unspecified: Secondary | ICD-10-CM | POA: Diagnosis not present

## 2020-12-05 ENCOUNTER — Other Ambulatory Visit: Payer: Self-pay | Admitting: Oncology

## 2020-12-10 ENCOUNTER — Encounter: Payer: Self-pay | Admitting: *Deleted

## 2020-12-11 DIAGNOSIS — R0902 Hypoxemia: Secondary | ICD-10-CM | POA: Diagnosis not present

## 2020-12-15 ENCOUNTER — Encounter: Payer: Self-pay | Admitting: Oncology

## 2020-12-15 ENCOUNTER — Inpatient Hospital Stay (HOSPITAL_BASED_OUTPATIENT_CLINIC_OR_DEPARTMENT_OTHER): Payer: Medicare Other | Admitting: Oncology

## 2020-12-15 ENCOUNTER — Inpatient Hospital Stay: Payer: Medicare Other

## 2020-12-15 VITALS — BP 150/65 | HR 70 | Temp 97.8°F | Resp 16 | Ht 71.0 in | Wt 184.9 lb

## 2020-12-15 DIAGNOSIS — Z87891 Personal history of nicotine dependence: Secondary | ICD-10-CM | POA: Diagnosis not present

## 2020-12-15 DIAGNOSIS — C3411 Malignant neoplasm of upper lobe, right bronchus or lung: Secondary | ICD-10-CM | POA: Diagnosis not present

## 2020-12-15 DIAGNOSIS — Z8616 Personal history of COVID-19: Secondary | ICD-10-CM | POA: Diagnosis not present

## 2020-12-15 DIAGNOSIS — N1832 Chronic kidney disease, stage 3b: Secondary | ICD-10-CM | POA: Diagnosis not present

## 2020-12-15 DIAGNOSIS — Z5112 Encounter for antineoplastic immunotherapy: Secondary | ICD-10-CM

## 2020-12-15 DIAGNOSIS — C3431 Malignant neoplasm of lower lobe, right bronchus or lung: Secondary | ICD-10-CM

## 2020-12-15 DIAGNOSIS — I10 Essential (primary) hypertension: Secondary | ICD-10-CM | POA: Diagnosis not present

## 2020-12-15 DIAGNOSIS — Z79899 Other long term (current) drug therapy: Secondary | ICD-10-CM | POA: Diagnosis not present

## 2020-12-15 DIAGNOSIS — C3401 Malignant neoplasm of right main bronchus: Secondary | ICD-10-CM

## 2020-12-15 LAB — CBC WITH DIFFERENTIAL/PLATELET
Abs Immature Granulocytes: 0.01 10*3/uL (ref 0.00–0.07)
Basophils Absolute: 0 10*3/uL (ref 0.0–0.1)
Basophils Relative: 0 %
Eosinophils Absolute: 0.3 10*3/uL (ref 0.0–0.5)
Eosinophils Relative: 5 %
HCT: 41.3 % (ref 39.0–52.0)
Hemoglobin: 13.7 g/dL (ref 13.0–17.0)
Immature Granulocytes: 0 %
Lymphocytes Relative: 22 %
Lymphs Abs: 1.3 10*3/uL (ref 0.7–4.0)
MCH: 30.2 pg (ref 26.0–34.0)
MCHC: 33.2 g/dL (ref 30.0–36.0)
MCV: 91.2 fL (ref 80.0–100.0)
Monocytes Absolute: 0.5 10*3/uL (ref 0.1–1.0)
Monocytes Relative: 9 %
Neutro Abs: 3.6 10*3/uL (ref 1.7–7.7)
Neutrophils Relative %: 64 %
Platelets: 229 10*3/uL (ref 150–400)
RBC: 4.53 MIL/uL (ref 4.22–5.81)
RDW: 12.6 % (ref 11.5–15.5)
WBC: 5.7 10*3/uL (ref 4.0–10.5)
nRBC: 0 % (ref 0.0–0.2)

## 2020-12-15 LAB — COMPREHENSIVE METABOLIC PANEL
ALT: 12 U/L (ref 0–44)
AST: 19 U/L (ref 15–41)
Albumin: 3.6 g/dL (ref 3.5–5.0)
Alkaline Phosphatase: 57 U/L (ref 38–126)
Anion gap: 12 (ref 5–15)
BUN: 33 mg/dL — ABNORMAL HIGH (ref 8–23)
CO2: 22 mmol/L (ref 22–32)
Calcium: 9.1 mg/dL (ref 8.9–10.3)
Chloride: 103 mmol/L (ref 98–111)
Creatinine, Ser: 2.1 mg/dL — ABNORMAL HIGH (ref 0.61–1.24)
GFR, Estimated: 33 mL/min — ABNORMAL LOW (ref >60)
Glucose, Bld: 182 mg/dL — ABNORMAL HIGH (ref 70–99)
Potassium: 3.5 mmol/L (ref 3.5–5.1)
Sodium: 137 mmol/L (ref 135–145)
Total Bilirubin: 0.6 mg/dL (ref 0.3–1.2)
Total Protein: 7.3 g/dL (ref 6.5–8.1)

## 2020-12-15 MED ORDER — SODIUM CHLORIDE 0.9 % IV SOLN
Freq: Once | INTRAVENOUS | Status: AC
  Filled 2020-12-15: qty 250

## 2020-12-15 MED ORDER — HEPARIN SOD (PORK) LOCK FLUSH 100 UNIT/ML IV SOLN
INTRAVENOUS | Status: AC
  Filled 2020-12-15: qty 5

## 2020-12-15 MED ORDER — SODIUM CHLORIDE 0.9 % IV SOLN
Freq: Once | INTRAVENOUS | Status: AC
Start: 2020-12-15 — End: 2020-12-15
  Filled 2020-12-15: qty 250

## 2020-12-15 MED ORDER — HEPARIN SOD (PORK) LOCK FLUSH 100 UNIT/ML IV SOLN
500.0000 [IU] | Freq: Once | INTRAVENOUS | Status: AC | PRN
  Administered 2020-12-15: 500 [IU]
  Filled 2020-12-15: qty 5

## 2020-12-15 MED ORDER — DURVALUMAB 500 MG/10ML IV SOLN
10.0000 mg/kg | Freq: Once | INTRAVENOUS | Status: AC
  Administered 2020-12-15: 860 mg via INTRAVENOUS
  Filled 2020-12-15: qty 10

## 2020-12-15 NOTE — Patient Instructions (Signed)
[facility]   Discharge Instructions: Thank you for choosing [facility] to provide your oncology and hematology care.  If you have a lab appointment with the [facility], please go directly to the [facility] and check in at the registration area.  Wear comfortable clothing and clothing appropriate for easy access to any Portacath or PICC line.   We strive to give you quality time with your provider. You may need to reschedule your appointment if you arrive late (15 or more minutes).  Arriving late affects you and other patients whose appointments are after yours.  Also, if you miss three or more appointments without notifying the office, you may be dismissed from the clinic at the provider's discretion.      For prescription refill requests, have your pharmacy contact our office and allow 72 hours for refills to be completed.    Today you received the following chemotherapy and/or immunotherapy agents Imfiniz  Durvalumab injection What is this medicine? DURVALUMAB (dur VAL ue mab) is a monoclonal antibody. It is used to treat lung cancer. This medicine may be used for other purposes; ask your health care provider or pharmacist if you have questions. COMMON BRAND NAME(S): IMFINZI What should I tell my health care provider before I take this medicine? They need to know if you have any of these conditions:  autoimmune diseases like Crohn's disease, ulcerative colitis, or lupus  have had or planning to have an allogeneic stem cell transplant (uses someone else's stem cells)  history of organ transplant  history of radiation to the chest  nervous system problems like myasthenia gravis or Guillain-Barre syndrome  an unusual or allergic reaction to durvalumab, other medicines, foods, dyes, or preservatives  pregnant or trying to get pregnant  breast-feeding How should I use this medicine? This medicine is for infusion into a vein.  It is given by a health care professional in a hospital or clinic setting. A special MedGuide will be given to you before each treatment. Be sure to read this information carefully each time. Talk to your pediatrician regarding the use of this medicine in children. Special care may be needed. Overdosage: If you think you have taken too much of this medicine contact a poison control center or emergency room at once. NOTE: This medicine is only for you. Do not share this medicine with others. What if I miss a dose? It is important not to miss your dose. Call your doctor or health care professional if you are unable to keep an appointment. What may interact with this medicine? Interactions have not been studied. This list may not describe all possible interactions. Give your health care provider a list of all the medicines, herbs, non-prescription drugs, or dietary supplements you use. Also tell them if you smoke, drink alcohol, or use illegal drugs. Some items may interact with your medicine. What should I watch for while using this medicine? This drug may make you feel generally unwell. Continue your course of treatment even though you feel ill unless your doctor tells you to stop. You may need blood work done while you are taking this medicine. Do not become pregnant while taking this medicine or for 3 months after stopping it. Women should inform their doctor if they wish to become pregnant or think they might be pregnant. There is a potential for serious side effects to an unborn child. Talk to your health care professional or pharmacist for more information. Do not  breast-feed an infant while taking this medicine or for 3 months after stopping it. What side effects may I notice from receiving this medicine? Side effects that you should report to your doctor or health care professional as soon as possible:  allergic reactions like skin rash, itching or hives, swelling of the face, lips, or  tongue  black, tarry stools  bloody or watery diarrhea  breathing problems  change in emotions or moods  change in sex drive  changes in vision  chest pain or chest tightness  chills  confusion  cough  facial flushing  fever  headache  signs and symptoms of high blood sugar such as dizziness; dry mouth; dry skin; fruity breath; nausea; stomach pain; increased hunger or thirst; increased urination  signs and symptoms of liver injury like dark yellow or brown urine; general ill feeling or flu-like symptoms; light-colored stools; loss of appetite; nausea; right upper belly pain; unusually weak or tired; yellowing of the eyes or skin  stomach pain  trouble passing urine or change in the amount of urine  weight gain or weight loss Side effects that usually do not require medical attention (report these to your doctor or health care professional if they continue or are bothersome):  bone pain  constipation  loss of appetite  muscle pain  nausea  swelling of the ankles, feet, hands  tiredness This list may not describe all possible side effects. Call your doctor for medical advice about side effects. You may report side effects to FDA at 1-800-FDA-1088. Where should I keep my medicine? This drug is given in a hospital or clinic and will not be stored at home. NOTE: This sheet is a summary. It may not cover all possible information. If you have questions about this medicine, talk to your doctor, pharmacist, or health care provider.  2021 Elsevier/Gold Standard (2019-09-20 13:01:29)    To help prevent nausea and vomiting after your treatment, we encourage you to take your nausea medication as directed.  BELOW ARE SYMPTOMS THAT SHOULD BE REPORTED IMMEDIATELY: . *FEVER GREATER THAN 100.4 F (38 C) OR HIGHER . *CHILLS OR SWEATING . *NAUSEA AND VOMITING THAT IS NOT CONTROLLED WITH YOUR NAUSEA MEDICATION . *UNUSUAL SHORTNESS OF BREATH . *UNUSUAL BRUISING OR  BLEEDING . *URINARY PROBLEMS (pain or burning when urinating, or frequent urination) . *BOWEL PROBLEMS (unusual diarrhea, constipation, pain near the anus) . TENDERNESS IN MOUTH AND THROAT WITH OR WITHOUT PRESENCE OF ULCERS (sore throat, sores in mouth, or a toothache) . UNUSUAL RASH, SWELLING OR PAIN  . UNUSUAL VAGINAL DISCHARGE OR ITCHING   Items with * indicate a potential emergency and should be followed up as soon as possible or go to the Emergency Department if any problems should occur.  Please show the CHEMOTHERAPY ALERT CARD or IMMUNOTHERAPY ALERT CARD at check-in to the Emergency Department and triage nurse.  Should you have questions after your visit or need to cancel or reschedule your appointment, please contact [facility]  [phone] and follow the prompts.  Office hours are 8:00 a.m. to 4:30 p.m. Monday - Friday. Please note that voicemails left after 4:00 p.m. may not be returned until the following business day.  We are closed weekends and major holidays. You have access to a nurse at all times for urgent questions. Please call the main number to the clinic [phone] and follow the prompts.  For any non-urgent questions, you may also contact your provider using MyChart. We now offer e-Visits  for anyone 34 and older to request care online for non-urgent symptoms. For details visit [email or web address].   Also download the MyChart app! Go to the app store, search "MyChart", open the app, select [facility], and log in with your MyChart username and password.  Due to Covid, a mask is required upon entering the hospital/clinic. If you do not have a mask, one will be given to you upon arrival. For doctor visits, patients may have 1 support person aged 29 or older with them. For treatment visits, patients cannot have anyone with them due to current Covid guidelines and our immunocompromised population.

## 2020-12-15 NOTE — Progress Notes (Signed)
Pt doing ok but wants to know what the results of the cyst like spot on left arm at elbow. No other issues per pt

## 2020-12-15 NOTE — Progress Notes (Signed)
Hematology/Oncology Consult note [facility]  [phone] [phone]  Patient Care Team: Perrin Maltese, MD as PCP - General (Internal Medicine) Telford Nab, RN as Oncology Nurse Navigator Sindy Guadeloupe, MD as Consulting Physician (Hematology and Oncology)   Name of the patient: Martin Jennings  [id number]  April 01, 1951   Date of visit: 12/15/20  Diagnosis- stage IIIc adenocarcinoma of the lung cT3 cN3 M0  Chief complaint/ Reason for visit-on treatment assessment prior to cycle 10 of maintenance durvalumab  Heme/Onc history: Patient is a 70 year old male with a history of tobacco dependence who was admitted to the hospital for symptoms of acute onset shortness of breath and was found to have Covid pneumonia. He underwent CT angio chest which showed extensive mediastinal as well as bilateral hilar adenopathy. Masslike opacity with architectural distortion involving the right upper lobe measuring 5.7 x 2.9 x 2.3 cm and groundglass opacities bilaterally. Mass involving the left adrenal gland as well as a smaller right adrenal gland possibly indicating adrenal metastases.  This was followed by a PET CT scan 2 weeks later which showed the consolidative process in the right upper lobesuv7.88 concerning for infiltrating tumor. It appears to involve the pleura associated interstitial thickening was also seen. Bulky right hilar and mediastinal adenopathy. Enlarged somewhat necrotic appearing left supraclavicular lymph node was also hypermetabolic. Adrenal gland was not hypermetabolic and was consistent with a benign adenoma. No metastatic disease involving liver. No features of distant metastatic disease.  Pathology showed metastatic non-small cell lung cancer which was TTF-1 positive consistent with adenocarcinoma   Interval history-other than mild fatigue patient denies any complaints at this time.  Swelling over the left elbow has  remained unchanged.  It does not particularly bother him  ECOG PS- 1 Pain scale- 0   Review of systems- Review of Systems  Constitutional: Negative for chills, fever, malaise/fatigue and weight loss.  HENT: Negative for congestion, ear discharge and nosebleeds.   Eyes: Negative for blurred vision.  Respiratory: Negative for cough, hemoptysis, sputum production, shortness of breath and wheezing.   Cardiovascular: Negative for chest pain, palpitations, orthopnea and claudication.  Gastrointestinal: Negative for abdominal pain, blood in stool, constipation, diarrhea, heartburn, melena, nausea and vomiting.  Genitourinary: Negative for dysuria, flank pain, frequency, hematuria and urgency.  Musculoskeletal: Negative for back pain, joint pain and myalgias.  Skin: Negative for rash.  Neurological: Negative for dizziness, tingling, focal weakness, seizures, weakness and headaches.  Endo/Heme/Allergies: Does not bruise/bleed easily.  Psychiatric/Behavioral: Negative for depression and suicidal ideas. The patient does not have insomnia.       No Known Allergies   Past Medical History:  Diagnosis Date  . Bladder cancer ([facility]) 2012  . COVID-19 virus infection 04/03/2020  . Hypertension   . Lung cancer ([facility])   . Peripheral vascular disease [facility])      Past Surgical History:  Procedure Laterality Date  . BLADDER REMOVAL    . LOWER EXTREMITY ANGIOGRAPHY Left 04/12/2017   Procedure: Lower Extremity Angiography;  Surgeon: Katha Cabal, MD;  Location: [facility];  Service: Cardiovascular;  Laterality: Left;  . PORTA CATH INSERTION N/A 05/22/2020   Procedure: PORTA CATH INSERTION;  Surgeon: Algernon Huxley, MD;  Location: [facility];  Service: Cardiovascular;  Laterality: N/A;  . uretostomy    . VASCULAR SURGERY      Social History   Socioeconomic History  . Marital status: Married    Spouse name: Not on file  .  Number of children: Not on file  . Years of  education: Not on file  . Highest education level: Not on file  Occupational History  . Not on file  Tobacco Use  . Smoking status: Former Smoker    Packs/day: 1.25    Years: 50.00    Pack years: 62.50    Types: Cigarettes    Quit date: 03/13/2020    Years since quitting: 0.7  . Smokeless tobacco: Never Used  Vaping Use  . Vaping Use: Never used  Substance and Sexual Activity  . Alcohol use: No  . Drug use: Not Currently    Types: Marijuana  . Sexual activity: Not Currently  Other Topics Concern  . Not on file  Social History Narrative  . Not on file   Social Determinants of Health   Financial Resource Strain: Not on file  Food Insecurity: Not on file  Transportation Needs: Not on file  Physical Activity: Not on file  Stress: Not on file  Social Connections: Not on file  Intimate Partner Violence: Not on file    History reviewed. No pertinent family history.   Current Outpatient Medications:  .  amLODipine (NORVASC) 10 MG tablet, Take 10 mg by mouth daily. , Disp: , Rfl:  .  atorvastatin (LIPITOR) 20 MG tablet, Take 20 mg by mouth every evening. , Disp: , Rfl:  .  clopidogrel (PLAVIX) 75 MG tablet, Take 75 mg by mouth daily. , Disp: , Rfl:  .  oxyCODONE (OXY IR/ROXICODONE) 5 MG immediate release tablet, TAKE 1 TABLET BY MOUTH EVERY 8 HOURS AS NEEDED FOR SEVERE PAIN, Disp: 30 tablet, Rfl: 0 .  magic mouthwash SOLN, Take 5 mLs by mouth 4 (four) times daily as needed for mouth pain. (Patient not taking: No sig reported), Disp: 240 mL, Rfl: 0 No current facility-administered medications for this visit.  Facility-Administered Medications Ordered in Other Visits:  .  sodium chloride flush (NS) 0.9 % injection 10 mL, 10 mL, Intravenous, PRN, Sindy Guadeloupe, MD, 10 mL at 09/18/20 0850  Physical exam:  Vitals:   12/15/20 1001  BP: (!) 150/65  Pulse: 70  Resp: 16  Temp: 97.8 F (36.6 C)  TempSrc: Oral  Weight: 184 lb 14.4 oz (83.9 kg)  Height: 5\' 11"  (1.803 m)    Physical Exam Constitutional:      General: He is not in acute distress. Cardiovascular:     Rate and Rhythm: Normal rate and regular rhythm.     Heart sounds: Normal heart sounds.  Pulmonary:     Effort: Pulmonary effort is normal.     Breath sounds: Normal breath sounds.  Musculoskeletal:     Comments: Cystic swelling over left elbow unchanged  Skin:    General: Skin is warm and dry.  Neurological:     Mental Status: He is alert and oriented to person, place, and time.      CMP Latest Ref Rng & Units 12/15/2020  Glucose 70 - 99 mg/dL 182(H)  BUN 8 - 23 mg/dL 33(H)  Creatinine 0.61 - 1.24 mg/dL 2.10(H)  Sodium 135 - 145 mmol/L 137  Potassium 3.5 - 5.1 mmol/L 3.5  Chloride 98 - 111 mmol/L 103  CO2 22 - 32 mmol/L 22  Calcium 8.9 - 10.3 mg/dL 9.1  Total Protein 6.5 - 8.1 g/dL 7.3  Total Bilirubin 0.3 - 1.2 mg/dL 0.6  Alkaline Phos 38 - 126 U/L 57  AST 15 - 41 U/L 19  ALT 0 - 44 U/L  12   CBC Latest Ref Rng & Units 12/15/2020  WBC 4.0 - 10.5 K/uL 5.7  Hemoglobin 13.0 - 17.0 g/dL 13.7  Hematocrit 39.0 - 52.0 % 41.3  Platelets 150 - 400 K/uL 229    No images are attached to the encounter.  [place] LT UPPER EXTREM LTD SOFT TISSUE NON VASCULAR  Result Date: 12/02/2020 CLINICAL DATA:  Palpable mass on the left elbow. EXAM: ULTRASOUND left UPPER EXTREMITY LIMITED TECHNIQUE: Ultrasound examination of the upper extremity soft tissues was performed in the area of clinical concern. COMPARISON:  None. FINDINGS: The patient's palpable abnormality corresponds to a 2.0 x 0.7 x 1.7 cm cystic appearing fluid collection without complicating features. This could be a ganglion cyst or liquified hematoma or abscess. Recommend clinical correlation. IMPRESSION: 2.0 x 0.7 x 1.7 cm cystic area corresponding to the patient's palpable abnormality as above. Electronically Signed   By: Marijo Sanes M.D.   On: 12/02/2020 10:02     Assessment and plan- Patient is a 70 y.o. male withstage IIIc  adenocarcinoma of the right upper lobe of the lung cT4 N3 M0.  He is here for on treatment assessment prior to cycle 10 of maintenance durvalumab  Counts okay to proceed with cycle 10 of maintenance durvalumab today.  He will be seen by covering MD/NP in 2 weeks and I will see him back in 4 weeks.Patient does have baseline CKD and his creatinine typically fluctuates between 1.6-1.8.  Today it is 2.1 which is higher than before.  He has had similar numbers back in September 2021 and subsequently they did improve.  Continue to monitor.  We will give him 1 L of IV fluids today  Discussed the results of ultrasound of the left elbow which showed a cystic swelling In that area the etiology of which is unclear.  I would like to hold off on any aspiration or biopsy at this time since it is not bothering the patient.  If there is a consistent increase in the size of the swelling we will get a biopsy/FNA at that time   Visit Diagnosis 1. Encounter for antineoplastic immunotherapy   2. Stage 3b chronic kidney disease ([place])   3. Malignant neoplasm of lower lobe of right lung ([place])      Dr. Randa Evens, MD, MPH [facility] [phone] 12/15/2020 3:58 PM

## 2020-12-15 NOTE — Progress Notes (Signed)
Labs reviewed with MD and treatment team. Per MD to continue with treatment . Pt updated.   Martin Jennings [employer]

## 2020-12-24 ENCOUNTER — Ambulatory Visit: Payer: Medicare Other | Admitting: Radiation Oncology

## 2020-12-28 ENCOUNTER — Other Ambulatory Visit: Payer: Self-pay | Admitting: *Deleted

## 2020-12-28 DIAGNOSIS — C3401 Malignant neoplasm of right main bronchus: Secondary | ICD-10-CM

## 2020-12-28 DIAGNOSIS — Z5112 Encounter for antineoplastic immunotherapy: Secondary | ICD-10-CM

## 2020-12-29 ENCOUNTER — Other Ambulatory Visit: Payer: Self-pay | Admitting: Oncology

## 2020-12-29 ENCOUNTER — Inpatient Hospital Stay (HOSPITAL_BASED_OUTPATIENT_CLINIC_OR_DEPARTMENT_OTHER): Payer: Medicare Other | Admitting: Oncology

## 2020-12-29 ENCOUNTER — Encounter: Payer: Self-pay | Admitting: Oncology

## 2020-12-29 ENCOUNTER — Inpatient Hospital Stay: Payer: Medicare Other

## 2020-12-29 ENCOUNTER — Inpatient Hospital Stay: Payer: Medicare Other | Attending: Oncology

## 2020-12-29 VITALS — BP 127/79 | HR 67 | Temp 97.4°F | Resp 20 | Wt 186.7 lb

## 2020-12-29 DIAGNOSIS — Z8616 Personal history of COVID-19: Secondary | ICD-10-CM | POA: Diagnosis not present

## 2020-12-29 DIAGNOSIS — C3401 Malignant neoplasm of right main bronchus: Secondary | ICD-10-CM

## 2020-12-29 DIAGNOSIS — R2232 Localized swelling, mass and lump, left upper limb: Secondary | ICD-10-CM | POA: Diagnosis not present

## 2020-12-29 DIAGNOSIS — Z8551 Personal history of malignant neoplasm of bladder: Secondary | ICD-10-CM | POA: Insufficient documentation

## 2020-12-29 DIAGNOSIS — C3411 Malignant neoplasm of upper lobe, right bronchus or lung: Secondary | ICD-10-CM | POA: Diagnosis not present

## 2020-12-29 DIAGNOSIS — Z79899 Other long term (current) drug therapy: Secondary | ICD-10-CM | POA: Insufficient documentation

## 2020-12-29 DIAGNOSIS — N1832 Chronic kidney disease, stage 3b: Secondary | ICD-10-CM | POA: Insufficient documentation

## 2020-12-29 DIAGNOSIS — Z87891 Personal history of nicotine dependence: Secondary | ICD-10-CM | POA: Insufficient documentation

## 2020-12-29 DIAGNOSIS — I10 Essential (primary) hypertension: Secondary | ICD-10-CM | POA: Insufficient documentation

## 2020-12-29 DIAGNOSIS — Z5112 Encounter for antineoplastic immunotherapy: Secondary | ICD-10-CM | POA: Insufficient documentation

## 2020-12-29 DIAGNOSIS — Z95828 Presence of other vascular implants and grafts: Secondary | ICD-10-CM

## 2020-12-29 LAB — COMPREHENSIVE METABOLIC PANEL
ALT: 11 U/L (ref 0–44)
AST: 15 U/L (ref 15–41)
Albumin: 3.6 g/dL (ref 3.5–5.0)
Alkaline Phosphatase: 61 U/L (ref 38–126)
Anion gap: 9 (ref 5–15)
BUN: 29 mg/dL — ABNORMAL HIGH (ref 8–23)
CO2: 23 mmol/L (ref 22–32)
Calcium: 8.6 mg/dL — ABNORMAL LOW (ref 8.9–10.3)
Chloride: 105 mmol/L (ref 98–111)
Creatinine, Ser: 1.84 mg/dL — ABNORMAL HIGH (ref 0.61–1.24)
GFR, Estimated: 39 mL/min — ABNORMAL LOW (ref >60)
Glucose, Bld: 119 mg/dL — ABNORMAL HIGH (ref 70–99)
Potassium: 3.7 mmol/L (ref 3.5–5.1)
Sodium: 137 mmol/L (ref 135–145)
Total Bilirubin: 0.4 mg/dL (ref 0.3–1.2)
Total Protein: 7.1 g/dL (ref 6.5–8.1)

## 2020-12-29 LAB — CBC WITH DIFFERENTIAL/PLATELET
Abs Immature Granulocytes: 0.01 10*3/uL (ref 0.00–0.07)
Basophils Absolute: 0 10*3/uL (ref 0.0–0.1)
Basophils Relative: 1 %
Eosinophils Absolute: 0.3 10*3/uL (ref 0.0–0.5)
Eosinophils Relative: 4 %
HCT: 41.1 % (ref 39.0–52.0)
Hemoglobin: 13.9 g/dL (ref 13.0–17.0)
Immature Granulocytes: 0 %
Lymphocytes Relative: 19 %
Lymphs Abs: 1.3 10*3/uL (ref 0.7–4.0)
MCH: 30.7 pg (ref 26.0–34.0)
MCHC: 33.8 g/dL (ref 30.0–36.0)
MCV: 90.7 fL (ref 80.0–100.0)
Monocytes Absolute: 0.5 10*3/uL (ref 0.1–1.0)
Monocytes Relative: 8 %
Neutro Abs: 4.5 10*3/uL (ref 1.7–7.7)
Neutrophils Relative %: 68 %
Platelets: 177 10*3/uL (ref 150–400)
RBC: 4.53 MIL/uL (ref 4.22–5.81)
RDW: 12.5 % (ref 11.5–15.5)
WBC: 6.6 10*3/uL (ref 4.0–10.5)
nRBC: 0 % (ref 0.0–0.2)

## 2020-12-29 MED ORDER — HEPARIN SOD (PORK) LOCK FLUSH 100 UNIT/ML IV SOLN
500.0000 [IU] | Freq: Once | INTRAVENOUS | Status: DC
  Filled 2020-12-29: qty 5

## 2020-12-29 MED ORDER — SODIUM CHLORIDE 0.9% FLUSH
10.0000 mL | Freq: Once | INTRAVENOUS | Status: AC
  Administered 2020-12-29: 10 mL via INTRAVENOUS
  Filled 2020-12-29: qty 10

## 2020-12-29 MED ORDER — HEPARIN SOD (PORK) LOCK FLUSH 100 UNIT/ML IV SOLN
500.0000 [IU] | Freq: Once | INTRAVENOUS | Status: AC | PRN
  Administered 2020-12-29: 500 [IU]
  Filled 2020-12-29: qty 5

## 2020-12-29 MED ORDER — SODIUM CHLORIDE 0.9 % IV SOLN
Freq: Once | INTRAVENOUS | Status: AC
  Filled 2020-12-29: qty 250

## 2020-12-29 MED ORDER — SODIUM CHLORIDE 0.9 % IV SOLN
10.0000 mg/kg | Freq: Once | INTRAVENOUS | Status: AC
  Administered 2020-12-29: 860 mg via INTRAVENOUS
  Filled 2020-12-29: qty 10

## 2020-12-29 MED ORDER — HEPARIN SOD (PORK) LOCK FLUSH 100 UNIT/ML IV SOLN
INTRAVENOUS | Status: AC
  Filled 2020-12-29: qty 5

## 2020-12-29 NOTE — Patient Instructions (Signed)
[facility]  Discharge Instructions: Thank you for choosing [facility] to provide your oncology and hematology care.  If you have a lab appointment with the [facility], please go directly to the [facility] and check in at the registration area.  Wear comfortable clothing and clothing appropriate for easy access to any Portacath or PICC line.   We strive to give you quality time with your provider. You may need to reschedule your appointment if you arrive late (15 or more minutes).  Arriving late affects you and other patients whose appointments are after yours.  Also, if you miss three or more appointments without notifying the office, you may be dismissed from the clinic at the provider's discretion.      For prescription refill requests, have your pharmacy contact our office and allow 72 hours for refills to be completed.    Today you received the following chemotherapy and/or immunotherapy agents       To help prevent nausea and vomiting after your treatment, we encourage you to take your nausea medication as directed.  BELOW ARE SYMPTOMS THAT SHOULD BE REPORTED IMMEDIATELY: . *FEVER GREATER THAN 100.4 F (38 C) OR HIGHER . *CHILLS OR SWEATING . *NAUSEA AND VOMITING THAT IS NOT CONTROLLED WITH YOUR NAUSEA MEDICATION . *UNUSUAL SHORTNESS OF BREATH . *UNUSUAL BRUISING OR BLEEDING . *URINARY PROBLEMS (pain or burning when urinating, or frequent urination) . *BOWEL PROBLEMS (unusual diarrhea, constipation, pain near the anus) . TENDERNESS IN MOUTH AND THROAT WITH OR WITHOUT PRESENCE OF ULCERS (sore throat, sores in mouth, or a toothache) . UNUSUAL RASH, SWELLING OR PAIN  . UNUSUAL VAGINAL DISCHARGE OR ITCHING   Items with * indicate a potential emergency and should be followed up as soon as possible or go to the Emergency Department if any problems should occur.  Please show the CHEMOTHERAPY ALERT CARD or IMMUNOTHERAPY ALERT CARD at  check-in to the Emergency Department and triage nurse.  Should you have questions after your visit or need to cancel or reschedule your appointment, please contact [facility]  [phone] and follow the prompts.  Office hours are 8:00 a.m. to 4:30 p.m. Monday - Friday. Please note that voicemails left after 4:00 p.m. may not be returned until the following business day.  We are closed weekends and major holidays. You have access to a nurse at all times for urgent questions. Please call the main number to the clinic [phone] and follow the prompts.  For any non-urgent questions, you may also contact your provider using MyChart. We now offer e-Visits for anyone 76 and older to request care online for non-urgent symptoms. For details visit [email or web address].   Also download the MyChart app! Go to the app store, search "MyChart", open the app, select [facility], and log in with your MyChart username and password.  Due to Covid, a mask is required upon entering the hospital/clinic. If you do not have a mask, one will be given to you upon arrival. For doctor visits, patients may have 1 support person aged 24 or older with them. For treatment visits, patients cannot have anyone with them due to current Covid guidelines and our immunocompromised population.   Durvalumab injection What is this medicine? DURVALUMAB (dur VAL ue mab) is a monoclonal antibody. It is used to treat lung cancer. This medicine may be used for other purposes; ask your health care provider or pharmacist if you have questions. COMMON BRAND NAME(S): IMFINZI  What should I tell my health care provider before I take this medicine? They need to know if you have any of these conditions:  autoimmune diseases like Crohn's disease, ulcerative colitis, or lupus  have had or planning to have an allogeneic stem cell transplant (uses someone else's stem cells)  history of organ  transplant  history of radiation to the chest  nervous system problems like myasthenia gravis or Guillain-Barre syndrome  an unusual or allergic reaction to durvalumab, other medicines, foods, dyes, or preservatives  pregnant or trying to get pregnant  breast-feeding How should I use this medicine? This medicine is for infusion into a vein. It is given by a health care professional in a hospital or clinic setting. A special MedGuide will be given to you before each treatment. Be sure to read this information carefully each time. Talk to your pediatrician regarding the use of this medicine in children. Special care may be needed. Overdosage: If you think you have taken too much of this medicine contact a poison control center or emergency room at once. NOTE: This medicine is only for you. Do not share this medicine with others. What if I miss a dose? It is important not to miss your dose. Call your doctor or health care professional if you are unable to keep an appointment. What may interact with this medicine? Interactions have not been studied. This list may not describe all possible interactions. Give your health care provider a list of all the medicines, herbs, non-prescription drugs, or dietary supplements you use. Also tell them if you smoke, drink alcohol, or use illegal drugs. Some items may interact with your medicine. What should I watch for while using this medicine? This drug may make you feel generally unwell. Continue your course of treatment even though you feel ill unless your doctor tells you to stop. You may need blood work done while you are taking this medicine. Do not become pregnant while taking this medicine or for 3 months after stopping it. Women should inform their doctor if they wish to become pregnant or think they might be pregnant. There is a potential for serious side effects to an unborn child. Talk to your health care professional or pharmacist for more  information. Do not breast-feed an infant while taking this medicine or for 3 months after stopping it. What side effects may I notice from receiving this medicine? Side effects that you should report to your doctor or health care professional as soon as possible:  allergic reactions like skin rash, itching or hives, swelling of the face, lips, or tongue  black, tarry stools  bloody or watery diarrhea  breathing problems  change in emotions or moods  change in sex drive  changes in vision  chest pain or chest tightness  chills  confusion  cough  facial flushing  fever  headache  signs and symptoms of high blood sugar such as dizziness; dry mouth; dry skin; fruity breath; nausea; stomach pain; increased hunger or thirst; increased urination  signs and symptoms of liver injury like dark yellow or brown urine; general ill feeling or flu-like symptoms; light-colored stools; loss of appetite; nausea; right upper belly pain; unusually weak or tired; yellowing of the eyes or skin  stomach pain  trouble passing urine or change in the amount of urine  weight gain or weight loss Side effects that usually do not require medical attention (report these to your doctor or health care professional if they continue or are bothersome):  bone pain  constipation  loss of appetite  muscle pain  nausea  swelling of the ankles, feet, hands  tiredness This list may not describe all possible side effects. Call your doctor for medical advice about side effects. You may report side effects to FDA at 1-800-FDA-1088. Where should I keep my medicine? This drug is given in a hospital or clinic and will not be stored at home. NOTE: This sheet is a summary. It may not cover all possible information. If you have questions about this medicine, talk to your doctor, pharmacist, or health care provider.  2021 Elsevier/Gold Standard (2019-09-20 13:01:29)

## 2020-12-29 NOTE — Progress Notes (Signed)
Creatinine 1.84 today. Okay to proceed with treatment today per Rulon Abide, NP

## 2020-12-29 NOTE — Progress Notes (Signed)
Patient here today for follow up regarding lung cancer. Patient reports nodule to left arm that is painful at times.

## 2020-12-29 NOTE — Progress Notes (Signed)
Per NP ok to treat with SCr

## 2020-12-29 NOTE — Progress Notes (Signed)
Hematology/Oncology Consult note [facility]  Telephone:([phone] [phone]  Patient Care Team: Perrin Maltese, MD as PCP - General (Internal Medicine) Telford Nab, RN as Oncology Nurse Navigator Sindy Guadeloupe, MD as Consulting Physician (Hematology and Oncology)   Name of the patient: Martin Jennings  [id number]  09/23/1950   Date of visit: 12/29/20  Diagnosis- stage IIIc adenocarcinoma of the lung cT3 cN3 M0  Chief complaint/ Reason for visit-on treatment assessment prior to cycle 10 of maintenance durvalumab  Heme/Onc history: Patient is a 70 year old male with a history of tobacco dependence who was admitted to the hospital for symptoms of acute onset shortness of breath and was found to have Covid pneumonia. He underwent CT angio chest which showed extensive mediastinal as well as bilateral hilar adenopathy. Masslike opacity with architectural distortion involving the right upper lobe measuring 5.7 x 2.9 x 2.3 cm and groundglass opacities bilaterally. Mass involving the left adrenal gland as well as a smaller right adrenal gland possibly indicating adrenal metastases.  This was followed by a PET CT scan 2 weeks later which showed the consolidative process in the right upper lobesuv7.88 concerning for infiltrating tumor. It appears to involve the pleura associated interstitial thickening was also seen. Bulky right hilar and mediastinal adenopathy. Enlarged somewhat necrotic appearing left supraclavicular lymph node was also hypermetabolic. Adrenal gland was not hypermetabolic and was consistent with a benign adenoma. No metastatic disease involving liver. No features of distant metastatic disease.  Pathology showed metastatic non-small cell lung cancer which was TTF-1 positive consistent with adenocarcinoma  Interval history-patient reports mild fatigue.  Reports growth of cyst on left elbow.  Reports some discomfort especially when  he hits it on something.   ECOG PS- 1 Pain scale- 0   Review of systems- Review of Systems  Constitutional: Negative for chills, fever, malaise/fatigue and weight loss.  HENT: Negative for congestion, ear discharge and nosebleeds.   Eyes: Negative for blurred vision.  Respiratory: Negative for cough, hemoptysis, sputum production, shortness of breath and wheezing.   Cardiovascular: Negative for chest pain, palpitations, orthopnea and claudication.  Gastrointestinal: Negative for abdominal pain, blood in stool, constipation, diarrhea, heartburn, melena, nausea and vomiting.  Genitourinary: Negative for dysuria, flank pain, frequency, hematuria and urgency.  Musculoskeletal: Negative for back pain, joint pain and myalgias.       Left elbow cyst  Skin: Negative for rash.  Neurological: Negative for dizziness, tingling, focal weakness, seizures, weakness and headaches.  Endo/Heme/Allergies: Does not bruise/bleed easily.  Psychiatric/Behavioral: Negative for depression and suicidal ideas. The patient does not have insomnia.       No Known Allergies   Past Medical History:  Diagnosis Date  . Bladder cancer ([facility]) 2012  . COVID-19 virus infection 04/03/2020  . Hypertension   . Lung cancer ([facility])   . Peripheral vascular disease [facility])      Past Surgical History:  Procedure Laterality Date  . BLADDER REMOVAL    . LOWER EXTREMITY ANGIOGRAPHY Left 04/12/2017   Procedure: Lower Extremity Angiography;  Surgeon: Katha Cabal, MD;  Location: [facility];  Service: Cardiovascular;  Laterality: Left;  . PORTA CATH INSERTION N/A 05/22/2020   Procedure: PORTA CATH INSERTION;  Surgeon: Algernon Huxley, MD;  Location: [facility];  Service: Cardiovascular;  Laterality: N/A;  . uretostomy    . VASCULAR SURGERY      Social History   Socioeconomic History  . Marital status: Married  Spouse name: Not on file  . Number of children: Not on file  . Years of education: Not  on file  . Highest education level: Not on file  Occupational History  . Not on file  Tobacco Use  . Smoking status: Former Smoker    Packs/day: 1.25    Years: 50.00    Pack years: 62.50    Types: Cigarettes    Quit date: 03/13/2020    Years since quitting: 0.7  . Smokeless tobacco: Never Used  Vaping Use  . Vaping Use: Never used  Substance and Sexual Activity  . Alcohol use: No  . Drug use: Not Currently    Types: Marijuana  . Sexual activity: Not Currently  Other Topics Concern  . Not on file  Social History Narrative  . Not on file   Social Determinants of Health   Financial Resource Strain: Not on file  Food Insecurity: Not on file  Transportation Needs: Not on file  Physical Activity: Not on file  Stress: Not on file  Social Connections: Not on file  Intimate Partner Violence: Not on file    History reviewed. No pertinent family history.   Current Outpatient Medications:  .  amLODipine (NORVASC) 10 MG tablet, Take 10 mg by mouth daily. , Disp: , Rfl:  .  atorvastatin (LIPITOR) 20 MG tablet, Take 20 mg by mouth every evening. , Disp: , Rfl:  .  clopidogrel (PLAVIX) 75 MG tablet, Take 75 mg by mouth daily. , Disp: , Rfl:  .  oxyCODONE (OXY IR/ROXICODONE) 5 MG immediate release tablet, TAKE 1 TABLET BY MOUTH EVERY 8 HOURS AS NEEDED FOR SEVERE PAIN, Disp: 30 tablet, Rfl: 0 .  magic mouthwash SOLN, Take 5 mLs by mouth 4 (four) times daily as needed for mouth pain. (Patient not taking: No sig reported), Disp: 240 mL, Rfl: 0 No current facility-administered medications for this visit.  Facility-Administered Medications Ordered in Other Visits:  .  durvalumab (IMFINZI) 860 mg in sodium chloride 0.9 % 100 mL chemo infusion, 10 mg/kg (Order-Specific), Intravenous, Once, Sindy Guadeloupe, MD .  heparin lock flush 100 unit/mL, 500 Units, Intracatheter, Once PRN, Sindy Guadeloupe, MD .  sodium chloride flush (NS) 0.9 % injection 10 mL, 10 mL, Intravenous, PRN, Sindy Guadeloupe,  MD, 10 mL at 09/18/20 0850  Physical exam:  Vitals:   12/29/20 1320  BP: 127/79  Pulse: 67  Resp: 20  Temp: (!) 97.4 F (36.3 C)  TempSrc: Tympanic  SpO2: 97%  Weight: 186 lb 11.2 oz (84.7 kg)   Physical Exam Constitutional:      General: He is not in acute distress. Cardiovascular:     Rate and Rhythm: Normal rate and regular rhythm.     Heart sounds: Normal heart sounds.  Pulmonary:     Effort: Pulmonary effort is normal.     Breath sounds: Normal breath sounds.  Musculoskeletal:     Comments: Cystic swelling over left elbow appears larger per patient  Skin:    General: Skin is warm and dry.  Neurological:     Mental Status: He is alert and oriented to person, place, and time.      CMP Latest Ref Rng & Units 12/29/2020  Glucose 70 - 99 mg/dL 119(H)  BUN 8 - 23 mg/dL 29(H)  Creatinine 0.61 - 1.24 mg/dL 1.84(H)  Sodium 135 - 145 mmol/L 137  Potassium 3.5 - 5.1 mmol/L 3.7  Chloride 98 - 111 mmol/L 105  CO2 22 -  32 mmol/L 23  Calcium 8.9 - 10.3 mg/dL 8.6(L)  Total Protein 6.5 - 8.1 g/dL 7.1  Total Bilirubin 0.3 - 1.2 mg/dL 0.4  Alkaline Phos 38 - 126 U/L 61  AST 15 - 41 U/L 15  ALT 0 - 44 U/L 11   CBC Latest Ref Rng & Units 12/29/2020  WBC 4.0 - 10.5 K/uL 6.6  Hemoglobin 13.0 - 17.0 g/dL 13.9  Hematocrit 39.0 - 52.0 % 41.1  Platelets 150 - 400 K/uL 177    No images are attached to the encounter.  [place] LT UPPER EXTREM LTD SOFT TISSUE NON VASCULAR  Result Date: 12/02/2020 CLINICAL DATA:  Palpable mass on the left elbow. EXAM: ULTRASOUND left UPPER EXTREMITY LIMITED TECHNIQUE: Ultrasound examination of the upper extremity soft tissues was performed in the area of clinical concern. COMPARISON:  None. FINDINGS: The patient's palpable abnormality corresponds to a 2.0 x 0.7 x 1.7 cm cystic appearing fluid collection without complicating features. This could be a ganglion cyst or liquified hematoma or abscess. Recommend clinical correlation. IMPRESSION: 2.0 x 0.7 x 1.7 cm  cystic area corresponding to the patient's palpable abnormality as above. Electronically Signed   By: Marijo Sanes M.D.   On: 12/02/2020 10:02     Assessment and plan- Patient is a 70 y.o. male withstage IIIc adenocarcinoma of the right upper lobe of the lung cT4 N3 M0.  He is here for on treatment assessment prior to cycle 11 of maintenance durvalumab  Counts okay to proceed with cycle 11 of maintenance durvalumab today.    He will return to clinic in 2 weeks to see Dr. Janese Banks with lab work and possible nivolumab.  Baseline kidney function is between 1.6-1.8.  Labs from today show a creatinine of 1.84.   Left elbow cyst: Recently had imaging of his left elbow which showed a 2 x 1.7 cm fluid collection without complicating features.  This could represent a ganglion cyst or hematoma versus abscess.  Previously Dr. Janese Banks mentioned aspiration should cyst become bothersome.  Recommended biopsy/FNA.  Patient would like to get this arranged in the next couple weeks.    Visit Diagnosis 1. Malignant neoplasm of hilus of right lung ([place])   2. Skin lump of arm, left     Greater than 50% was spent in counseling and coordination of care with this patient including but not limited to discussion of the relevant topics above (See A&P) including, but not limited to diagnosis and management of acute and chronic medical conditions.   Faythe Casa, NP 12/29/2020 2:01 PM

## 2020-12-30 ENCOUNTER — Telehealth: Payer: Self-pay

## 2020-12-30 ENCOUNTER — Other Ambulatory Visit: Payer: Self-pay

## 2020-12-30 DIAGNOSIS — M7989 Other specified soft tissue disorders: Secondary | ICD-10-CM

## 2020-12-30 NOTE — Telephone Encounter (Signed)
Called again at 1:32 back to [facility] for Dr. Humphrey Rolls for clearance letter for patient . Judeen Hammans will check tomorrow .

## 2020-12-30 NOTE — Telephone Encounter (Signed)
Called Dr office and left message for call back for clearance for biopsy DC

## 2020-12-31 ENCOUNTER — Other Ambulatory Visit: Payer: Self-pay | Admitting: Oncology

## 2021-01-01 ENCOUNTER — Other Ambulatory Visit: Payer: Self-pay | Admitting: Oncology

## 2021-01-02 ENCOUNTER — Other Ambulatory Visit: Payer: Self-pay | Admitting: Oncology

## 2021-01-02 ENCOUNTER — Other Ambulatory Visit: Payer: Self-pay

## 2021-01-02 DIAGNOSIS — M7989 Other specified soft tissue disorders: Secondary | ICD-10-CM

## 2021-01-02 DIAGNOSIS — Z936 Other artificial openings of urinary tract status: Secondary | ICD-10-CM | POA: Diagnosis not present

## 2021-01-02 NOTE — Progress Notes (Signed)
Orders placed for incision and drainage of left soft tissue cyst.  Faythe Casa, NP 01/02/2021 2:02 PM

## 2021-01-12 ENCOUNTER — Encounter: Payer: Self-pay | Admitting: Oncology

## 2021-01-12 ENCOUNTER — Inpatient Hospital Stay (HOSPITAL_BASED_OUTPATIENT_CLINIC_OR_DEPARTMENT_OTHER): Payer: Medicare Other | Admitting: Oncology

## 2021-01-12 ENCOUNTER — Inpatient Hospital Stay: Payer: Medicare Other

## 2021-01-12 VITALS — BP 144/67 | HR 73 | Temp 97.8°F | Resp 16 | Ht 71.0 in | Wt 187.3 lb

## 2021-01-12 VITALS — BP 131/65 | HR 71 | Resp 16

## 2021-01-12 DIAGNOSIS — N1832 Chronic kidney disease, stage 3b: Secondary | ICD-10-CM

## 2021-01-12 DIAGNOSIS — C3401 Malignant neoplasm of right main bronchus: Secondary | ICD-10-CM | POA: Diagnosis not present

## 2021-01-12 DIAGNOSIS — Z8551 Personal history of malignant neoplasm of bladder: Secondary | ICD-10-CM | POA: Diagnosis not present

## 2021-01-12 DIAGNOSIS — Z5112 Encounter for antineoplastic immunotherapy: Secondary | ICD-10-CM

## 2021-01-12 DIAGNOSIS — I10 Essential (primary) hypertension: Secondary | ICD-10-CM | POA: Diagnosis not present

## 2021-01-12 DIAGNOSIS — Z87891 Personal history of nicotine dependence: Secondary | ICD-10-CM | POA: Diagnosis not present

## 2021-01-12 DIAGNOSIS — C3411 Malignant neoplasm of upper lobe, right bronchus or lung: Secondary | ICD-10-CM | POA: Diagnosis not present

## 2021-01-12 DIAGNOSIS — Z8616 Personal history of COVID-19: Secondary | ICD-10-CM | POA: Diagnosis not present

## 2021-01-12 DIAGNOSIS — Z79899 Other long term (current) drug therapy: Secondary | ICD-10-CM | POA: Diagnosis not present

## 2021-01-12 LAB — CBC WITH DIFFERENTIAL/PLATELET
Abs Immature Granulocytes: 0.02 10*3/uL (ref 0.00–0.07)
Basophils Absolute: 0 10*3/uL (ref 0.0–0.1)
Basophils Relative: 0 %
Eosinophils Absolute: 0.3 10*3/uL (ref 0.0–0.5)
Eosinophils Relative: 5 %
HCT: 40.1 % (ref 39.0–52.0)
Hemoglobin: 13.2 g/dL (ref 13.0–17.0)
Immature Granulocytes: 0 %
Lymphocytes Relative: 19 %
Lymphs Abs: 1.1 10*3/uL (ref 0.7–4.0)
MCH: 30.5 pg (ref 26.0–34.0)
MCHC: 32.9 g/dL (ref 30.0–36.0)
MCV: 92.6 fL (ref 80.0–100.0)
Monocytes Absolute: 0.5 10*3/uL (ref 0.1–1.0)
Monocytes Relative: 8 %
Neutro Abs: 3.8 10*3/uL (ref 1.7–7.7)
Neutrophils Relative %: 68 %
Platelets: 187 10*3/uL (ref 150–400)
RBC: 4.33 MIL/uL (ref 4.22–5.81)
RDW: 12.6 % (ref 11.5–15.5)
WBC: 5.6 10*3/uL (ref 4.0–10.5)
nRBC: 0 % (ref 0.0–0.2)

## 2021-01-12 LAB — COMPREHENSIVE METABOLIC PANEL
ALT: 10 U/L (ref 0–44)
AST: 20 U/L (ref 15–41)
Albumin: 3.4 g/dL — ABNORMAL LOW (ref 3.5–5.0)
Alkaline Phosphatase: 62 U/L (ref 38–126)
Anion gap: 9 (ref 5–15)
BUN: 35 mg/dL — ABNORMAL HIGH (ref 8–23)
CO2: 22 mmol/L (ref 22–32)
Calcium: 8.6 mg/dL — ABNORMAL LOW (ref 8.9–10.3)
Chloride: 107 mmol/L (ref 98–111)
Creatinine, Ser: 2 mg/dL — ABNORMAL HIGH (ref 0.61–1.24)
GFR, Estimated: 35 mL/min — ABNORMAL LOW (ref >60)
Glucose, Bld: 208 mg/dL — ABNORMAL HIGH (ref 70–99)
Potassium: 3.6 mmol/L (ref 3.5–5.1)
Sodium: 138 mmol/L (ref 135–145)
Total Bilirubin: 0.4 mg/dL (ref 0.3–1.2)
Total Protein: 6.8 g/dL (ref 6.5–8.1)

## 2021-01-12 MED ORDER — SODIUM CHLORIDE 0.9 % IV SOLN
Freq: Once | INTRAVENOUS | Status: AC
  Filled 2021-01-12: qty 250

## 2021-01-12 MED ORDER — HEPARIN SOD (PORK) LOCK FLUSH 100 UNIT/ML IV SOLN
INTRAVENOUS | Status: AC
  Filled 2021-01-12: qty 5

## 2021-01-12 MED ORDER — SODIUM CHLORIDE 0.9 % IV SOLN
10.0000 mg/kg | Freq: Once | INTRAVENOUS | Status: AC
  Administered 2021-01-12: 860 mg via INTRAVENOUS
  Filled 2021-01-12: qty 10

## 2021-01-12 MED ORDER — HEPARIN SOD (PORK) LOCK FLUSH 100 UNIT/ML IV SOLN
500.0000 [IU] | Freq: Once | INTRAVENOUS | Status: AC
Start: 2021-01-12 — End: 2021-01-12
  Administered 2021-01-12: 500 [IU] via INTRAVENOUS
  Filled 2021-01-12: qty 5

## 2021-01-12 MED ORDER — HEPARIN SOD (PORK) LOCK FLUSH 100 UNIT/ML IV SOLN
500.0000 [IU] | Freq: Once | INTRAVENOUS | Status: DC | PRN
  Filled 2021-01-12: qty 5

## 2021-01-12 MED ORDER — SODIUM CHLORIDE 0.9% FLUSH
10.0000 mL | Freq: Once | INTRAVENOUS | Status: AC
Start: 2021-01-12 — End: 2021-01-12
  Administered 2021-01-12: 10 mL via INTRAVENOUS
  Filled 2021-01-12: qty 10

## 2021-01-12 NOTE — Progress Notes (Signed)
Cr 2.0. Per Lanetta Inch., RN, per Dr. Janese Banks, patient is okay to proceed with treatment.

## 2021-01-12 NOTE — Patient Instructions (Signed)
[facility]  Discharge Instructions: Thank you for choosing [facility] to provide your oncology and hematology care.  If you have a lab appointment with the [facility], please go directly to the [facility] and check in at the registration area.  Wear comfortable clothing and clothing appropriate for easy access to any Portacath or PICC line.   We strive to give you quality time with your provider. You may need to reschedule your appointment if you arrive late (15 or more minutes).  Arriving late affects you and other patients whose appointments are after yours.  Also, if you miss three or more appointments without notifying the office, you may be dismissed from the clinic at the provider's discretion.      For prescription refill requests, have your pharmacy contact our office and allow 72 hours for refills to be completed.    Today you received the following chemotherapy and/or immunotherapy agents Imfinzi      To help prevent nausea and vomiting after your treatment, we encourage you to take your nausea medication as directed.  BELOW ARE SYMPTOMS THAT SHOULD BE REPORTED IMMEDIATELY: *FEVER GREATER THAN 100.4 F (38 C) OR HIGHER *CHILLS OR SWEATING *NAUSEA AND VOMITING THAT IS NOT CONTROLLED WITH YOUR NAUSEA MEDICATION *UNUSUAL SHORTNESS OF BREATH *UNUSUAL BRUISING OR BLEEDING *URINARY PROBLEMS (pain or burning when urinating, or frequent urination) *BOWEL PROBLEMS (unusual diarrhea, constipation, pain near the anus) TENDERNESS IN MOUTH AND THROAT WITH OR WITHOUT PRESENCE OF ULCERS (sore throat, sores in mouth, or a toothache) UNUSUAL RASH, SWELLING OR PAIN  UNUSUAL VAGINAL DISCHARGE OR ITCHING   Items with * indicate a potential emergency and should be followed up as soon as possible or go to the Emergency Department if any problems should occur.  Please show the CHEMOTHERAPY ALERT CARD or IMMUNOTHERAPY ALERT CARD at check-in to  the Emergency Department and triage nurse.  Should you have questions after your visit or need to cancel or reschedule your appointment, please contact [facility]  [phone] and follow the prompts.  Office hours are 8:00 a.m. to 4:30 p.m. Monday - Friday. Please note that voicemails left after 4:00 p.m. may not be returned until the following business day.  We are closed weekends and major holidays. You have access to a nurse at all times for urgent questions. Please call the main number to the clinic [phone] and follow the prompts.  For any non-urgent questions, you may also contact your provider using MyChart. We now offer e-Visits for anyone 41 and older to request care online for non-urgent symptoms. For details visit [email or web address].   Also download the MyChart app! Go to the app store, search "MyChart", open the app, select [facility], and log in with your MyChart username and password.  Due to Covid, a mask is required upon entering the hospital/clinic. If you do not have a mask, one will be given to you upon arrival. For doctor visits, patients may have 1 support person aged 57 or older with them. For treatment visits, patients cannot have anyone with them due to current Covid guidelines and our immunocompromised population.

## 2021-01-12 NOTE — Progress Notes (Signed)
Hematology/Oncology Consult note [facility]  Telephone:([phone] Fax:([phone]  Patient Care Team: Perrin Maltese, MD as PCP - General (Internal Medicine) Telford Nab, RN as Oncology Nurse Navigator Sindy Guadeloupe, MD as Consulting Physician (Hematology and Oncology)   Name of the patient: Martin Jennings  [id number]  1950-11-07   Date of visit: 01/12/21  Diagnosis- stage IIIc adenocarcinoma of the lung cT3 cN3 M0  Chief complaint/ Reason for visit-on treatment assessment prior to cycle 11 of maintenance durvalumab  Heme/Onc history: Patient is a 70 year old male with a history of tobacco dependence who was admitted to the hospital for symptoms of acute onset shortness of breath and was found to have Covid pneumonia.  He underwent CT angio chest which showed extensive mediastinal as well as bilateral hilar adenopathy.  Masslike opacity with architectural distortion involving the right upper lobe measuring 5.7 x 2.9 x 2.3 cm and groundglass opacities bilaterally.  Mass involving the left adrenal gland as well as a smaller right adrenal gland possibly indicating adrenal metastases.     This was followed by a PET CT scan 2 weeks later which showed the consolidative process in the right upper lobe suv 7.88 concerning for infiltrating tumor.  It appears to involve the pleura associated interstitial thickening was also seen.  Bulky right hilar and mediastinal adenopathy.  Enlarged somewhat necrotic appearing left supraclavicular lymph node was also hypermetabolic.  Adrenal gland was not hypermetabolic and was consistent with a benign adenoma.  No metastatic disease involving liver.  No features of distant metastatic disease.   Pathology showed metastatic non-small cell lung cancer which was TTF-1 positive consistent with adenocarcinoma  Interval history-patient reports doing well.  He has chronic left hip pain for which she uses as needed oxycodone.  Denies any  other specific complaints at this time  ECOG PS- 1 Pain scale- 2 Opioid associated constipation- no  Review of systems- Review of Systems  Constitutional:  Positive for malaise/fatigue. Negative for chills, fever and weight loss.  HENT:  Negative for congestion, ear discharge and nosebleeds.   Eyes:  Negative for blurred vision.  Respiratory:  Negative for cough, hemoptysis, sputum production, shortness of breath and wheezing.   Cardiovascular:  Negative for chest pain, palpitations, orthopnea and claudication.  Gastrointestinal:  Negative for abdominal pain, blood in stool, constipation, diarrhea, heartburn, melena, nausea and vomiting.  Genitourinary:  Negative for dysuria, flank pain, frequency, hematuria and urgency.  Musculoskeletal:  Negative for back pain, joint pain and myalgias.       Left hip pain  Skin:  Negative for rash.  Neurological:  Negative for dizziness, tingling, focal weakness, seizures, weakness and headaches.  Endo/Heme/Allergies:  Does not bruise/bleed easily.  Psychiatric/Behavioral:  Negative for depression and suicidal ideas. The patient does not have insomnia.     No Known Allergies   Past Medical History:  Diagnosis Date   Bladder cancer ([place]) 2012   COVID-19 virus infection 04/03/2020   Hypertension    Lung cancer ([place])    Peripheral vascular disease ([place])      Past Surgical History:  Procedure Laterality Date   BLADDER REMOVAL     LOWER EXTREMITY ANGIOGRAPHY Left 04/12/2017   Procedure: Lower Extremity Angiography;  Surgeon: Katha Cabal, MD;  Location: [facility];  Service: Cardiovascular;  Laterality: Left;   PORTA CATH INSERTION N/A 05/22/2020   Procedure: PORTA CATH INSERTION;  Surgeon: Algernon Huxley, MD;  Location: [facility];  Service: Cardiovascular;  Laterality: N/A;   uretostomy     VASCULAR SURGERY      Social History   Socioeconomic History   Marital status: Married    Spouse name: Not on file   Number  of children: Not on file   Years of education: Not on file   Highest education level: Not on file  Occupational History   Not on file  Tobacco Use   Smoking status: Former    Packs/day: 1.25    Years: 50.00    Pack years: 62.50    Types: Cigarettes    Quit date: 03/13/2020    Years since quitting: 0.8   Smokeless tobacco: Never  Vaping Use   Vaping Use: Never used  Substance and Sexual Activity   Alcohol use: No   Drug use: Not Currently    Types: Marijuana   Sexual activity: Not Currently  Other Topics Concern   Not on file  Social History Narrative   Not on file   Social Determinants of Health   Financial Resource Strain: Not on file  Food Insecurity: Not on file  Transportation Needs: Not on file  Physical Activity: Not on file  Stress: Not on file  Social Connections: Not on file  Intimate Partner Violence: Not on file    History reviewed. No pertinent family history.   Current Outpatient Medications:    amLODipine (NORVASC) 10 MG tablet, Take 10 mg by mouth daily. , Disp: , Rfl:    atorvastatin (LIPITOR) 20 MG tablet, Take 20 mg by mouth every evening. , Disp: , Rfl:    clopidogrel (PLAVIX) 75 MG tablet, Take 75 mg by mouth daily. , Disp: , Rfl:    oxyCODONE (OXY IR/ROXICODONE) 5 MG immediate release tablet, TAKE 1 TABLET BY MOUTH EVERY 8 HOURS AS NEEDED FOR SEVERE PAIN, Disp: 30 tablet, Rfl: 0   magic mouthwash SOLN, Take 5 mLs by mouth 4 (four) times daily as needed for mouth pain. (Patient not taking: No sig reported), Disp: 240 mL, Rfl: 0 No current facility-administered medications for this visit.  Facility-Administered Medications Ordered in Other Visits:    heparin lock flush 100 unit/mL, 500 Units, Intracatheter, Once PRN, Sindy Guadeloupe, MD   sodium chloride flush (NS) 0.9 % injection 10 mL, 10 mL, Intravenous, PRN, Sindy Guadeloupe, MD, 10 mL at 09/18/20 0850  Physical exam:  Vitals:   01/12/21 0946  BP: (!) 144/67  Pulse: 73  Resp: 16  Temp: 97.8  F (36.6 C)  TempSrc: Oral  Weight: 187 lb 4.8 oz (85 kg)  Height: 5\' 11"  (1.803 m)   Physical Exam Cardiovascular:     Rate and Rhythm: Normal rate and regular rhythm.     Heart sounds: Normal heart sounds.  Pulmonary:     Effort: Pulmonary effort is normal.     Breath sounds: Normal breath sounds.  Skin:    General: Skin is warm and dry.  Neurological:     Mental Status: He is alert and oriented to person, place, and time.     CMP Latest Ref Rng & Units 01/12/2021  Glucose 70 - 99 mg/dL 208(H)  BUN 8 - 23 mg/dL 35(H)  Creatinine 0.61 - 1.24 mg/dL 2.00(H)  Sodium 135 - 145 mmol/L 138  Potassium 3.5 - 5.1 mmol/L 3.6  Chloride 98 - 111 mmol/L 107  CO2 22 - 32 mmol/L 22  Calcium 8.9 - 10.3 mg/dL 8.6(L)  Total Protein 6.5 - 8.1 g/dL 6.8  Total  Bilirubin 0.3 - 1.2 mg/dL 0.4  Alkaline Phos 38 - 126 U/L 62  AST 15 - 41 U/L 20  ALT 0 - 44 U/L 10   CBC Latest Ref Rng & Units 01/12/2021  WBC 4.0 - 10.5 K/uL 5.6  Hemoglobin 13.0 - 17.0 g/dL 13.2  Hematocrit 39.0 - 52.0 % 40.1  Platelets 150 - 400 K/uL 187     Assessment and plan- Patient is a 70 y.o. male with stage IIIc adenocarcinoma of the right upper lobe of the lung cT4 N3 M0.  He is here for on treatment assessment prior to cycle 11 of maintenance durvalumab  Counts okay to proceed with cycle 11 of maintenance durvalumab today and I will see him back in 2 weeks for cycle 12.  He has baseline CKD with a creatinine that fluctuates between 1.6-2 and is currently stable.  Continue to monitor   Visit Diagnosis 1. Malignant neoplasm of hilus of right lung ([place])   2. Encounter for antineoplastic immunotherapy   3. Stage 3b chronic kidney disease ([place])      Dr. Randa Evens, MD, MPH [facility] [phone] 01/12/2021 4:07 PM

## 2021-01-12 NOTE — Progress Notes (Signed)
Pt says he had liq. Diarrhea last week for 2 days and then it stopped and he was back to normal. He does not know what caused it.

## 2021-01-15 ENCOUNTER — Ambulatory Visit
Admission: RE | Admit: 2021-01-15 | Discharge: 2021-01-15 | Disposition: A | Discharge status: Home / Self Care | Payer: Medicare Other | Source: Ambulatory Visit | Attending: Radiation Oncology | Admitting: Radiation Oncology

## 2021-01-15 ENCOUNTER — Encounter: Payer: Self-pay | Admitting: Radiation Oncology

## 2021-01-15 ENCOUNTER — Other Ambulatory Visit: Payer: Self-pay

## 2021-01-15 ENCOUNTER — Telehealth: Payer: Self-pay | Admitting: *Deleted

## 2021-01-15 VITALS — BP 131/80 | HR 77 | Temp 97.2°F | Resp 20 | Wt 184.2 lb

## 2021-01-15 DIAGNOSIS — Z08 Encounter for follow-up examination after completed treatment for malignant neoplasm: Secondary | ICD-10-CM | POA: Diagnosis not present

## 2021-01-15 DIAGNOSIS — C3400 Malignant neoplasm of unspecified main bronchus: Secondary | ICD-10-CM

## 2021-01-15 DIAGNOSIS — M25552 Pain in left hip: Secondary | ICD-10-CM | POA: Diagnosis not present

## 2021-01-15 DIAGNOSIS — C3411 Malignant neoplasm of upper lobe, right bronchus or lung: Secondary | ICD-10-CM | POA: Insufficient documentation

## 2021-01-15 DIAGNOSIS — Z9221 Personal history of antineoplastic chemotherapy: Secondary | ICD-10-CM | POA: Diagnosis not present

## 2021-01-15 DIAGNOSIS — Z923 Personal history of irradiation: Secondary | ICD-10-CM | POA: Diagnosis not present

## 2021-01-15 DIAGNOSIS — F17210 Nicotine dependence, cigarettes, uncomplicated: Secondary | ICD-10-CM | POA: Diagnosis not present

## 2021-01-15 NOTE — Progress Notes (Signed)
Radiation Oncology Follow up Note  Name: Martin Jennings   Date:   01/15/2021 MRN:  [id number] DOB: Sep 12, 1950    [name] presents to the clinic today for 43-month follow-up status post concurrent chemoradiation for stage IIIc (T3 N3 M0) adenocarcinoma the right upper lobe.  REFERRING PROVIDER: Perrin Maltese, MD  HPI: Patient is a 70 year old male now at 5 months having completed concurrent chemoradiation therapy for stage IIIc adenocarcinoma the right upper lobe.Marland Kitchen  He is seen today in routine follow-up and is doing well specifically [place] cough hemoptysis or chest tightness he is currently onmaintenance durvalumab which she is tolerating well.  He does have persistent left hip pain for which she is using narcotic analgesics.  He had a CT scan back in April which I have reviewed shows improvement in bandlike peripheral pleuroparenchymal consolidation in the right upper lobe suggesting improving radiation changes.  No evidence of metastatic disease or increase in mediastinal adenopathy.  COMPLICATIONS OF TREATMENT: none  FOLLOW UP COMPLIANCE: keeps appointments   PHYSICAL EXAM:  BP 131/80   Pulse 77   Temp (!) 97.2 F (36.2 C) (Tympanic)   Resp 20   Wt 184 lb 3.2 oz (83.6 kg)   BMI 25.69 kg/m  Well-developed well-nourished patient in NAD. HEENT reveals PERLA, EOMI, discs not visualized.  Oral cavity is clear. No oral mucosal lesions are identified. Neck is clear without evidence of cervical or supraclavicular adenopathy. Lungs are clear to A&P. Cardiac examination is essentially unremarkable with regular rate and rhythm without murmur rub or thrill. Abdomen is benign with no organomegaly or masses noted. Motor sensory and DTR levels are equal and symmetric in the upper and lower extremities. Cranial nerves II through XII are grossly intact. Proprioception is intact. No peripheral adenopathy or edema is identified. No motor or sensory levels are noted. Crude visual fields are within  normal range.  RADIOLOGY RESULTS: CT scans chest abdomen and pelvis reviewed compatible with above-stated findings   PLAN: present time patient continues on maintenance immunotherapy.  He is tolerating his treatments well.  I have asked to see him back in 6 months for follow-up.  Patient is to call with any concerns.  I would like to take this opportunity to thank you for allowing me to participate in the care of your patient.Noreene Filbert, MD

## 2021-01-15 NOTE — Telephone Encounter (Signed)
Called Martin Jennings to let him know that we got an appt for cyst bx... I told Martin Jennings that the appt was 6/30 with arrival at medical mall 1:15. Once he registers at medical mall at [facility] then they will give him local numbing to the cyst and they will get bx or drain fluid and send it for testing. We had to get permission to go off of the plavix. His last day to take it 6/24. After that date he will not take it until he has had the bx. And he can start it back the evening of the procedure. Martin Jennings. Can eat and drink and drive himself. Martin Jennings agreeable to the above

## 2021-01-22 ENCOUNTER — Other Ambulatory Visit: Payer: Self-pay

## 2021-01-22 ENCOUNTER — Ambulatory Visit
Admission: RE | Admit: 2021-01-22 | Discharge: 2021-01-22 | Disposition: A | Discharge status: Home / Self Care | Payer: Medicare Other | Source: Ambulatory Visit | Attending: Oncology | Admitting: Oncology

## 2021-01-22 DIAGNOSIS — M67422 Ganglion, left elbow: Secondary | ICD-10-CM | POA: Diagnosis not present

## 2021-01-22 DIAGNOSIS — M25822 Other specified joint disorders, left elbow: Secondary | ICD-10-CM | POA: Insufficient documentation

## 2021-01-22 DIAGNOSIS — M7989 Other specified soft tissue disorders: Secondary | ICD-10-CM

## 2021-01-23 LAB — CYTOLOGY - NON PAP

## 2021-01-27 ENCOUNTER — Other Ambulatory Visit: Payer: Self-pay

## 2021-01-27 ENCOUNTER — Inpatient Hospital Stay: Payer: Medicare Other

## 2021-01-27 ENCOUNTER — Inpatient Hospital Stay (HOSPITAL_BASED_OUTPATIENT_CLINIC_OR_DEPARTMENT_OTHER): Payer: Medicare Other | Admitting: Oncology

## 2021-01-27 ENCOUNTER — Inpatient Hospital Stay: Payer: Medicare Other | Attending: Oncology

## 2021-01-27 VITALS — BP 140/84 | HR 71 | Temp 98.0°F | Resp 18 | Wt 190.0 lb

## 2021-01-27 DIAGNOSIS — Z5112 Encounter for antineoplastic immunotherapy: Secondary | ICD-10-CM | POA: Insufficient documentation

## 2021-01-27 DIAGNOSIS — Z79899 Other long term (current) drug therapy: Secondary | ICD-10-CM | POA: Diagnosis not present

## 2021-01-27 DIAGNOSIS — C3401 Malignant neoplasm of right main bronchus: Secondary | ICD-10-CM

## 2021-01-27 DIAGNOSIS — Z87891 Personal history of nicotine dependence: Secondary | ICD-10-CM | POA: Insufficient documentation

## 2021-01-27 DIAGNOSIS — C3411 Malignant neoplasm of upper lobe, right bronchus or lung: Secondary | ICD-10-CM | POA: Diagnosis not present

## 2021-01-27 DIAGNOSIS — Z5111 Encounter for antineoplastic chemotherapy: Secondary | ICD-10-CM | POA: Diagnosis present

## 2021-01-27 DIAGNOSIS — I10 Essential (primary) hypertension: Secondary | ICD-10-CM | POA: Insufficient documentation

## 2021-01-27 DIAGNOSIS — Z8551 Personal history of malignant neoplasm of bladder: Secondary | ICD-10-CM | POA: Diagnosis not present

## 2021-01-27 LAB — COMPREHENSIVE METABOLIC PANEL
ALT: 10 U/L (ref 0–44)
AST: 13 U/L — ABNORMAL LOW (ref 15–41)
Albumin: 3.5 g/dL (ref 3.5–5.0)
Alkaline Phosphatase: 58 U/L (ref 38–126)
Anion gap: 8 (ref 5–15)
BUN: 24 mg/dL — ABNORMAL HIGH (ref 8–23)
CO2: 24 mmol/L (ref 22–32)
Calcium: 8.7 mg/dL — ABNORMAL LOW (ref 8.9–10.3)
Chloride: 106 mmol/L (ref 98–111)
Creatinine, Ser: 1.86 mg/dL — ABNORMAL HIGH (ref 0.61–1.24)
GFR, Estimated: 39 mL/min — ABNORMAL LOW (ref >60)
Glucose, Bld: 116 mg/dL — ABNORMAL HIGH (ref 70–99)
Potassium: 3.8 mmol/L (ref 3.5–5.1)
Sodium: 138 mmol/L (ref 135–145)
Total Bilirubin: 0.6 mg/dL (ref 0.3–1.2)
Total Protein: 7 g/dL (ref 6.5–8.1)

## 2021-01-27 LAB — CBC WITH DIFFERENTIAL/PLATELET
Abs Immature Granulocytes: 0.02 10*3/uL (ref 0.00–0.07)
Basophils Absolute: 0 10*3/uL (ref 0.0–0.1)
Basophils Relative: 0 %
Eosinophils Absolute: 0.2 10*3/uL (ref 0.0–0.5)
Eosinophils Relative: 4 %
HCT: 41.7 % (ref 39.0–52.0)
Hemoglobin: 13.8 g/dL (ref 13.0–17.0)
Immature Granulocytes: 0 %
Lymphocytes Relative: 19 %
Lymphs Abs: 1 10*3/uL (ref 0.7–4.0)
MCH: 30.6 pg (ref 26.0–34.0)
MCHC: 33.1 g/dL (ref 30.0–36.0)
MCV: 92.5 fL (ref 80.0–100.0)
Monocytes Absolute: 0.5 10*3/uL (ref 0.1–1.0)
Monocytes Relative: 9 %
Neutro Abs: 3.6 10*3/uL (ref 1.7–7.7)
Neutrophils Relative %: 68 %
Platelets: 172 10*3/uL (ref 150–400)
RBC: 4.51 MIL/uL (ref 4.22–5.81)
RDW: 12.9 % (ref 11.5–15.5)
WBC: 5.4 10*3/uL (ref 4.0–10.5)
nRBC: 0 % (ref 0.0–0.2)

## 2021-01-27 LAB — TSH: TSH: 1.936 u[IU]/mL (ref 0.350–4.500)

## 2021-01-27 MED ORDER — DURVALUMAB 500 MG/10ML IV SOLN
10.0000 mg/kg | Freq: Once | INTRAVENOUS | Status: AC
  Administered 2021-01-27: 860 mg via INTRAVENOUS
  Filled 2021-01-27: qty 10

## 2021-01-27 MED ORDER — HEPARIN SOD (PORK) LOCK FLUSH 100 UNIT/ML IV SOLN
500.0000 [IU] | Freq: Once | INTRAVENOUS | Status: AC | PRN
  Administered 2021-01-27: 500 [IU]
  Filled 2021-01-27: qty 5

## 2021-01-27 MED ORDER — OXYCODONE HCL 5 MG PO TABS: 5.0000 mg | ORAL_TABLET | Freq: Four times a day (QID) | ORAL | 0 refills | Status: DC | PRN

## 2021-01-27 MED ORDER — SODIUM CHLORIDE 0.9 % IV SOLN
Freq: Once | INTRAVENOUS | Status: AC
  Filled 2021-01-27: qty 250

## 2021-01-27 NOTE — Patient Instructions (Signed)
[facility]  Discharge Instructions: Thank you for choosing [facility] to provide your oncology and hematology care.  If you have a lab appointment with the [facility], please go directly to the [facility] and check in at the registration area.  Wear comfortable clothing and clothing appropriate for easy access to any Portacath or PICC line.   We strive to give you quality time with your provider. You may need to reschedule your appointment if you arrive late (15 or more minutes).  Arriving late affects you and other patients whose appointments are after yours.  Also, if you miss three or more appointments without notifying the office, you may be dismissed from the clinic at the provider's discretion.      For prescription refill requests, have your pharmacy contact our office and allow 72 hours for refills to be completed.    Today you received the following chemotherapy and/or immunotherapy agents: Durvalumab.      To help prevent nausea and vomiting after your treatment, we encourage you to take your nausea medication as directed.  BELOW ARE SYMPTOMS THAT SHOULD BE REPORTED IMMEDIATELY: *FEVER GREATER THAN 100.4 F (38 C) OR HIGHER *CHILLS OR SWEATING *NAUSEA AND VOMITING THAT IS NOT CONTROLLED WITH YOUR NAUSEA MEDICATION *UNUSUAL SHORTNESS OF BREATH *UNUSUAL BRUISING OR BLEEDING *URINARY PROBLEMS (pain or burning when urinating, or frequent urination) *BOWEL PROBLEMS (unusual diarrhea, constipation, pain near the anus) TENDERNESS IN MOUTH AND THROAT WITH OR WITHOUT PRESENCE OF ULCERS (sore throat, sores in mouth, or a toothache) UNUSUAL RASH, SWELLING OR PAIN  UNUSUAL VAGINAL DISCHARGE OR ITCHING   Items with * indicate a potential emergency and should be followed up as soon as possible or go to the Emergency Department if any problems should occur.  Please show the CHEMOTHERAPY ALERT CARD or IMMUNOTHERAPY ALERT CARD at  check-in to the Emergency Department and triage nurse.  Should you have questions after your visit or need to cancel or reschedule your appointment, please contact [facility]  [phone] and follow the prompts.  Office hours are 8:00 a.m. to 4:30 p.m. Monday - Friday. Please note that voicemails left after 4:00 p.m. may not be returned until the following business day.  We are closed weekends and major holidays. You have access to a nurse at all times for urgent questions. Please call the main number to the clinic [phone] and follow the prompts.  For any non-urgent questions, you may also contact your provider using MyChart. We now offer e-Visits for anyone 25 and older to request care online for non-urgent symptoms. For details visit [email or web address].   Also download the MyChart app! Go to the app store, search "MyChart", open the app, select [facility], and log in with your MyChart username and password.  Due to Covid, a mask is required upon entering the hospital/clinic. If you do not have a mask, one will be given to you upon arrival. For doctor visits, patients may have 1 support person aged 87 or older with them. For treatment visits, patients cannot have anyone with them due to current Covid guidelines and our immunocompromised population.

## 2021-02-01 ENCOUNTER — Encounter: Payer: Self-pay | Admitting: Oncology

## 2021-02-01 NOTE — Progress Notes (Signed)
Hematology/Oncology Consult note [facility]  Telephone:([phone] Fax:([phone]  Patient Care Team: Perrin Maltese, MD as PCP - General (Internal Medicine) Telford Nab, RN as Oncology Nurse Navigator Sindy Guadeloupe, MD as Consulting Physician (Hematology and Oncology)   Name of the patient: Martin Jennings  [id number]  1951/07/09   Date of visit: 02/01/21  Diagnosis- stage IIIc adenocarcinoma of the lung cT3 cN3 M0    Chief complaint/ Reason for visit- on treatment assessment prior to cycle 12 of maintenance durvalumab  Heme/Onc history: Patient is a 70 year old male with a history of tobacco dependence who was admitted to the hospital for symptoms of acute onset shortness of breath and was found to have Covid pneumonia.  He underwent CT angio chest which showed extensive mediastinal as well as bilateral hilar adenopathy.  Masslike opacity with architectural distortion involving the right upper lobe measuring 5.7 x 2.9 x 2.3 cm and groundglass opacities bilaterally.  Mass involving the left adrenal gland as well as a smaller right adrenal gland possibly indicating adrenal metastases.     This was followed by a PET CT scan 2 weeks later which showed the consolidative process in the right upper lobe suv 7.88 concerning for infiltrating tumor.  It appears to involve the pleura associated interstitial thickening was also seen.  Bulky right hilar and mediastinal adenopathy.  Enlarged somewhat necrotic appearing left supraclavicular lymph node was also hypermetabolic.  Adrenal gland was not hypermetabolic and was consistent with a benign adenoma.  No metastatic disease involving liver.  No features of distant metastatic disease.   Pathology showed metastatic non-small cell lung cancer which was TTF-1 positive consistent with adenocarcinoma    Interval history- Patient had his left elbow cyst aspirated that showed inflammatory cells but no malignancy  ECOG PS-  1 Pain scale- 0   Review of systems- Review of Systems  Constitutional:  Positive for malaise/fatigue. Negative for chills, fever and weight loss.  HENT:  Negative for congestion, ear discharge and nosebleeds.   Eyes:  Negative for blurred vision.  Respiratory:  Negative for cough, hemoptysis, sputum production, shortness of breath and wheezing.   Cardiovascular:  Negative for chest pain, palpitations, orthopnea and claudication.  Gastrointestinal:  Negative for abdominal pain, blood in stool, constipation, diarrhea, heartburn, melena, nausea and vomiting.  Genitourinary:  Negative for dysuria, flank pain, frequency, hematuria and urgency.  Musculoskeletal:  Negative for back pain, joint pain and myalgias.  Skin:  Negative for rash.  Neurological:  Negative for dizziness, tingling, focal weakness, seizures, weakness and headaches.  Endo/Heme/Allergies:  Does not bruise/bleed easily.  Psychiatric/Behavioral:  Negative for depression and suicidal ideas. The patient does not have insomnia.      No Known Allergies   Past Medical History:  Diagnosis Date   Bladder cancer ([place]) 2012   COVID-19 virus infection 04/03/2020   Hypertension    Lung cancer ([place])    Peripheral vascular disease ([place])      Past Surgical History:  Procedure Laterality Date   BLADDER REMOVAL     LOWER EXTREMITY ANGIOGRAPHY Left 04/12/2017   Procedure: Lower Extremity Angiography;  Surgeon: Katha Cabal, MD;  Location: [facility];  Service: Cardiovascular;  Laterality: Left;   PORTA CATH INSERTION N/A 05/22/2020   Procedure: PORTA CATH INSERTION;  Surgeon: Algernon Huxley, MD;  Location: [facility];  Service: Cardiovascular;  Laterality: N/A;   uretostomy     VASCULAR SURGERY  Social History   Socioeconomic History   Marital status: Married    Spouse name: Not on file   Number of children: Not on file   Years of education: Not on file   Highest education level: Not on file   Occupational History   Not on file  Tobacco Use   Smoking status: Former    Packs/day: 1.25    Years: 50.00    Pack years: 62.50    Types: Cigarettes    Quit date: 03/13/2020    Years since quitting: 0.8   Smokeless tobacco: Never  Vaping Use   Vaping Use: Never used  Substance and Sexual Activity   Alcohol use: No   Drug use: Not Currently    Types: Marijuana   Sexual activity: Not Currently  Other Topics Concern   Not on file  Social History Narrative   Not on file   Social Determinants of Health   Financial Resource Strain: Not on file  Food Insecurity: Not on file  Transportation Needs: Not on file  Physical Activity: Not on file  Stress: Not on file  Social Connections: Not on file  Intimate Partner Violence: Not on file    No family history on file.   Current Outpatient Medications:    amLODipine (NORVASC) 10 MG tablet, Take 10 mg by mouth daily. , Disp: , Rfl:    atorvastatin (LIPITOR) 20 MG tablet, Take 20 mg by mouth every evening. , Disp: , Rfl:    carvedilol (COREG) 6.25 MG tablet, Take 6.25 mg by mouth 2 (two) times daily., Disp: , Rfl:    clopidogrel (PLAVIX) 75 MG tablet, Take 75 mg by mouth daily. , Disp: , Rfl:    oxyCODONE (OXY IR/ROXICODONE) 5 MG immediate release tablet, Take 1 tablet (5 mg total) by mouth every 6 (six) hours as needed for severe pain., Disp: 45 tablet, Rfl: 0 No current facility-administered medications for this visit.  Facility-Administered Medications Ordered in Other Visits:    sodium chloride flush (NS) 0.9 % injection 10 mL, 10 mL, Intravenous, PRN, Sindy Guadeloupe, MD, 10 mL at 09/18/20 0850  Physical exam:  Vitals:   01/27/21 1315  BP: 140/84  Pulse: 71  Resp: 18  Temp: 98 F (36.7 C)  TempSrc: Tympanic  SpO2: 97%  Weight: 190 lb (86.2 kg)   Physical Exam Constitutional:      General: He is not in acute distress. Cardiovascular:     Rate and Rhythm: Normal rate and regular rhythm.     Heart sounds: Normal  heart sounds.  Pulmonary:     Effort: Pulmonary effort is normal.     Breath sounds: Normal breath sounds.  Abdominal:     General: Bowel sounds are normal.     Palpations: Abdomen is soft.  Skin:    General: Skin is warm and dry.  Neurological:     Mental Status: He is alert and oriented to person, place, and time.     CMP Latest Ref Rng & Units 01/27/2021  Glucose 70 - 99 mg/dL 116(H)  BUN 8 - 23 mg/dL 24(H)  Creatinine 0.61 - 1.24 mg/dL 1.86(H)  Sodium 135 - 145 mmol/L 138  Potassium 3.5 - 5.1 mmol/L 3.8  Chloride 98 - 111 mmol/L 106  CO2 22 - 32 mmol/L 24  Calcium 8.9 - 10.3 mg/dL 8.7(L)  Total Protein 6.5 - 8.1 g/dL 7.0  Total Bilirubin 0.3 - 1.2 mg/dL 0.6  Alkaline Phos 38 - 126 U/L 58  AST  15 - 41 U/L 13(L)  ALT 0 - 44 U/L 10   CBC Latest Ref Rng & Units 01/27/2021  WBC 4.0 - 10.5 K/uL 5.4  Hemoglobin 13.0 - 17.0 g/dL 13.8  Hematocrit 39.0 - 52.0 % 41.7  Platelets 150 - 400 K/uL 172    No images are attached to the encounter.  [place] DRAIN/INJ SMALL JOINT/BURSA  Result Date: 01/22/2021 INDICATION: Cyst of the left elbow.  History of lung carcinoma. EXAM: ULTRASOUND GUIDED ASPIRATION OF LEFT ELBOW CYST MEDICATIONS: None. ANESTHESIA/SEDATION: None PROCEDURE: The procedure, risks, benefits, and alternatives were explained to the patient. Questions regarding the procedure were encouraged and answered. The patient understands and consents to the procedure. A time-out was performed prior to initiating the procedure. Subcutaneous cystic structure adjacent to the lateral aspect of the elbow joint was localized by ultrasound. The lateral left elbow region was prepped with chlorhexidine in a sterile fashion, and a sterile drape was applied covering the operative field. A sterile gown and sterile gloves were used for the procedure. Local anesthesia was provided with 1% Lidocaine. An 18 gauge spinal needle was advanced into the cystic area. Aspiration was performed. Additional ultrasound  was performed. Cyst fluid was sent for cytologic analysis. COMPLICATIONS: None immediate. FINDINGS: Well-circumscribed oval-shaped cyst along the lateral aspect of the elbow joint measures approximately 1.8 x 0.8 x 1.8 cm. Aspiration yielded 1 mL of thick, viscous yellowish fluid consistent with joint fluid. The cystic structure was nearly completely collapsed after aspiration. IMPRESSION: Aspiration of the subcutaneous cyst along the lateral aspect of the left elbow yielded viscous fluid consistent with joint fluid. Findings of the aspiration are consistent with a ganglion cyst. Aspiration of 1 mL of fluid resulted in decompression of the cyst. The cyst fluid was sent for cytologic analysis. Electronically Signed   By: Aletta Edouard M.D.   On: 01/22/2021 14:14     Assessment and plan- Patient is a 70 y.o. male with stage IIIc adenocarcinoma of the right upper lobe of the lung cT4 N3 M0.  He is here for on treatment assessment prior to cycle 1 3of maintenance durvalumab  Counts okay to proceed with cycle 13 of maintenance durvalumab today.  I will see him back in 2 weeks for cycle 14.  Overall he is tolerating durvalumab well without any significant side effects.  He will be due for repeat scans which we will schedule for later this month.  Ckd: overall stable. Continue to monitor   Visit Diagnosis 1. Malignant neoplasm of hilus of right lung ([place])   2. Encounter for antineoplastic immunotherapy      Dr. Randa Evens, MD, MPH [facility] [phone] 02/01/2021 2:44 PM

## 2021-02-09 ENCOUNTER — Encounter: Payer: Self-pay | Admitting: Oncology

## 2021-02-09 ENCOUNTER — Inpatient Hospital Stay: Payer: Medicare Other

## 2021-02-09 ENCOUNTER — Other Ambulatory Visit: Payer: Self-pay

## 2021-02-09 ENCOUNTER — Inpatient Hospital Stay (HOSPITAL_BASED_OUTPATIENT_CLINIC_OR_DEPARTMENT_OTHER): Payer: Medicare Other | Admitting: Oncology

## 2021-02-09 VITALS — BP 149/74 | HR 76 | Temp 96.1°F | Resp 20 | Wt 191.3 lb

## 2021-02-09 DIAGNOSIS — Z87891 Personal history of nicotine dependence: Secondary | ICD-10-CM | POA: Diagnosis not present

## 2021-02-09 DIAGNOSIS — Z79899 Other long term (current) drug therapy: Secondary | ICD-10-CM | POA: Diagnosis not present

## 2021-02-09 DIAGNOSIS — Z8551 Personal history of malignant neoplasm of bladder: Secondary | ICD-10-CM | POA: Diagnosis not present

## 2021-02-09 DIAGNOSIS — C3412 Malignant neoplasm of upper lobe, left bronchus or lung: Secondary | ICD-10-CM

## 2021-02-09 DIAGNOSIS — C3401 Malignant neoplasm of right main bronchus: Secondary | ICD-10-CM

## 2021-02-09 DIAGNOSIS — Z5112 Encounter for antineoplastic immunotherapy: Secondary | ICD-10-CM | POA: Diagnosis not present

## 2021-02-09 DIAGNOSIS — C3411 Malignant neoplasm of upper lobe, right bronchus or lung: Secondary | ICD-10-CM | POA: Diagnosis not present

## 2021-02-09 DIAGNOSIS — I10 Essential (primary) hypertension: Secondary | ICD-10-CM | POA: Diagnosis not present

## 2021-02-09 LAB — CBC WITH DIFFERENTIAL/PLATELET
Abs Immature Granulocytes: 0.01 10*3/uL (ref 0.00–0.07)
Basophils Absolute: 0 10*3/uL (ref 0.0–0.1)
Basophils Relative: 0 %
Eosinophils Absolute: 0.3 10*3/uL (ref 0.0–0.5)
Eosinophils Relative: 5 %
HCT: 41.7 % (ref 39.0–52.0)
Hemoglobin: 13.5 g/dL (ref 13.0–17.0)
Immature Granulocytes: 0 %
Lymphocytes Relative: 20 %
Lymphs Abs: 1.2 10*3/uL (ref 0.7–4.0)
MCH: 30.4 pg (ref 26.0–34.0)
MCHC: 32.4 g/dL (ref 30.0–36.0)
MCV: 93.9 fL (ref 80.0–100.0)
Monocytes Absolute: 0.5 10*3/uL (ref 0.1–1.0)
Monocytes Relative: 9 %
Neutro Abs: 3.7 10*3/uL (ref 1.7–7.7)
Neutrophils Relative %: 66 %
Platelets: 214 10*3/uL (ref 150–400)
RBC: 4.44 MIL/uL (ref 4.22–5.81)
RDW: 13 % (ref 11.5–15.5)
WBC: 5.7 10*3/uL (ref 4.0–10.5)
nRBC: 0 % (ref 0.0–0.2)

## 2021-02-09 LAB — COMPREHENSIVE METABOLIC PANEL
ALT: 9 U/L (ref 0–44)
AST: 13 U/L — ABNORMAL LOW (ref 15–41)
Albumin: 3.6 g/dL (ref 3.5–5.0)
Alkaline Phosphatase: 59 U/L (ref 38–126)
Anion gap: 8 (ref 5–15)
BUN: 29 mg/dL — ABNORMAL HIGH (ref 8–23)
CO2: 23 mmol/L (ref 22–32)
Calcium: 8.8 mg/dL — ABNORMAL LOW (ref 8.9–10.3)
Chloride: 108 mmol/L (ref 98–111)
Creatinine, Ser: 2.07 mg/dL — ABNORMAL HIGH (ref 0.61–1.24)
GFR, Estimated: 34 mL/min — ABNORMAL LOW (ref >60)
Glucose, Bld: 115 mg/dL — ABNORMAL HIGH (ref 70–99)
Potassium: 3.7 mmol/L (ref 3.5–5.1)
Sodium: 139 mmol/L (ref 135–145)
Total Bilirubin: 0.7 mg/dL (ref 0.3–1.2)
Total Protein: 7 g/dL (ref 6.5–8.1)

## 2021-02-09 MED ORDER — SODIUM CHLORIDE 0.9 % IV SOLN
Freq: Once | INTRAVENOUS | Status: AC
  Filled 2021-02-09: qty 250

## 2021-02-09 MED ORDER — SODIUM CHLORIDE 0.9% FLUSH
10.0000 mL | Freq: Once | INTRAVENOUS | Status: AC
  Administered 2021-02-09: 10 mL via INTRAVENOUS
  Filled 2021-02-09: qty 10

## 2021-02-09 MED ORDER — HEPARIN SOD (PORK) LOCK FLUSH 100 UNIT/ML IV SOLN
500.0000 [IU] | Freq: Once | INTRAVENOUS | Status: AC | PRN
  Administered 2021-02-09: 500 [IU]
  Filled 2021-02-09: qty 5

## 2021-02-09 MED ORDER — SODIUM CHLORIDE 0.9 % IV SOLN
10.0000 mg/kg | Freq: Once | INTRAVENOUS | Status: AC
  Administered 2021-02-09: 860 mg via INTRAVENOUS
  Filled 2021-02-09: qty 10

## 2021-02-09 MED ORDER — HEPARIN SOD (PORK) LOCK FLUSH 100 UNIT/ML IV SOLN
INTRAVENOUS | Status: AC
  Filled 2021-02-09: qty 5

## 2021-02-09 NOTE — Progress Notes (Signed)
Per Judeen Hammans RN per Dr. Janese Banks okay to proceed with Creatinine 2.07.

## 2021-02-09 NOTE — Progress Notes (Signed)
Hematology/Oncology Consult note [facility]  [phone] [phone]  Patient Care Team: Perrin Maltese, MD as PCP - General (Internal Medicine) Telford Nab, RN as Oncology Nurse Navigator Sindy Guadeloupe, MD as Consulting Physician (Hematology and Oncology)   Name of the patient: Martin Jennings  [id number]  May 19, 1951   Date of visit: 02/09/21  Diagnosis- stage IIIc adenocarcinoma of the lung cT3 cN3 M0    Chief complaint/ Reason for visit-on treatment assessment prior to cycle 13 of maintenance durvalumab  Heme/Onc history: Patient is a 70 year old male with a history of tobacco dependence who was admitted to the hospital for symptoms of acute onset shortness of breath and was found to have Covid pneumonia.  He underwent CT angio chest which showed extensive mediastinal as well as bilateral hilar adenopathy.  Masslike opacity with architectural distortion involving the right upper lobe measuring 5.7 x 2.9 x 2.3 cm and groundglass opacities bilaterally.  Mass involving the left adrenal gland as well as a smaller right adrenal gland possibly indicating adrenal metastases.     This was followed by a PET CT scan 2 weeks later which showed the consolidative process in the right upper lobe suv 7.88 concerning for infiltrating tumor.  It appears to involve the pleura associated interstitial thickening was also seen.  Bulky right hilar and mediastinal adenopathy.  Enlarged somewhat necrotic appearing left supraclavicular lymph node was also hypermetabolic.  Adrenal gland was not hypermetabolic and was consistent with a benign adenoma.  No metastatic disease involving liver.  No features of distant metastatic disease.   Pathology showed metastatic non-small cell lung cancer which was TTF-1 positive consistent with adenocarcinoma   Patient finished concurrent chemoradiation with weekly CarboTaxol.  Scan showed partial response and patient is currently  on maintenance durvalumab which was started on 07/29/2020  Interval history-patient reports doing well overall.  Shortness of breath is at his baseline.  He is not requiring any oxygen.  Has baseline fatigue  ECOG PS- 1 Pain scale- 0   Review of systems- Review of Systems  Constitutional:  Positive for malaise/fatigue. Negative for chills, fever and weight loss.  HENT:  Negative for congestion, ear discharge and nosebleeds.   Eyes:  Negative for blurred vision.  Respiratory:  Positive for shortness of breath. Negative for cough, hemoptysis, sputum production and wheezing.   Cardiovascular:  Negative for chest pain, palpitations, orthopnea and claudication.  Gastrointestinal:  Negative for abdominal pain, blood in stool, constipation, diarrhea, heartburn, melena, nausea and vomiting.  Genitourinary:  Negative for dysuria, flank pain, frequency, hematuria and urgency.  Musculoskeletal:  Negative for back pain, joint pain and myalgias.  Skin:  Negative for rash.  Neurological:  Negative for dizziness, tingling, focal weakness, seizures, weakness and headaches.  Endo/Heme/Allergies:  Does not bruise/bleed easily.  Psychiatric/Behavioral:  Negative for depression and suicidal ideas. The patient does not have insomnia.     No Known Allergies   Past Medical History:  Diagnosis Date   Bladder cancer ([place]) 2012   COVID-19 virus infection 04/03/2020   Hypertension    Lung cancer ([place])    Peripheral vascular disease ([place])      Past Surgical History:  Procedure Laterality Date   BLADDER REMOVAL     LOWER EXTREMITY ANGIOGRAPHY Left 04/12/2017   Procedure: Lower Extremity Angiography;  Surgeon: Katha Cabal, MD;  Location: [facility];  Service: Cardiovascular;  Laterality: Left;   PORTA CATH INSERTION N/A 05/22/2020   Procedure: Glori Luis  CATH INSERTION;  Surgeon: Algernon Huxley, MD;  Location: [facility];  Service: Cardiovascular;  Laterality: N/A;   uretostomy      VASCULAR SURGERY      Social History   Socioeconomic History   Marital status: Married    Spouse name: Not on file   Number of children: Not on file   Years of education: Not on file   Highest education level: Not on file  Occupational History   Not on file  Tobacco Use   Smoking status: Former    Packs/day: 1.25    Years: 50.00    Pack years: 62.50    Types: Cigarettes    Quit date: 03/13/2020    Years since quitting: 0.9   Smokeless tobacco: Never  Vaping Use   Vaping Use: Never used  Substance and Sexual Activity   Alcohol use: No   Drug use: Not Currently    Types: Marijuana   Sexual activity: Not Currently  Other Topics Concern   Not on file  Social History Narrative   Not on file   Social Determinants of Health   Financial Resource Strain: Not on file  Food Insecurity: Not on file  Transportation Needs: Not on file  Physical Activity: Not on file  Stress: Not on file  Social Connections: Not on file  Intimate Partner Violence: Not on file    History reviewed. No pertinent family history.   Current Outpatient Medications:    amLODipine (NORVASC) 10 MG tablet, Take 10 mg by mouth daily. , Disp: , Rfl:    atorvastatin (LIPITOR) 20 MG tablet, Take 20 mg by mouth every evening. , Disp: , Rfl:    carvedilol (COREG) 6.25 MG tablet, Take 6.25 mg by mouth 2 (two) times daily., Disp: , Rfl:    clopidogrel (PLAVIX) 75 MG tablet, Take 75 mg by mouth daily. , Disp: , Rfl:    oxyCODONE (OXY IR/ROXICODONE) 5 MG immediate release tablet, Take 1 tablet (5 mg total) by mouth every 6 (six) hours as needed for severe pain., Disp: 45 tablet, Rfl: 0 No current facility-administered medications for this visit.  Facility-Administered Medications Ordered in Other Visits:    sodium chloride flush (NS) 0.9 % injection 10 mL, 10 mL, Intravenous, PRN, Sindy Guadeloupe, MD, 10 mL at 09/18/20 0850  Physical exam:  Vitals:   02/09/21 1011  BP: (!) 149/74  Pulse: 76  Resp: 20   Temp: (!) 96.1 F (35.6 C)  TempSrc: Tympanic  SpO2: 95%  Weight: 191 lb 4.8 oz (86.8 kg)   Physical Exam Constitutional:      General: He is not in acute distress. Cardiovascular:     Rate and Rhythm: Normal rate and regular rhythm.     Heart sounds: Normal heart sounds.  Pulmonary:     Effort: Pulmonary effort is normal.     Breath sounds: Normal breath sounds.  Abdominal:     General: Bowel sounds are normal.     Palpations: Abdomen is soft.  Skin:    General: Skin is warm and dry.  Neurological:     Mental Status: He is alert and oriented to person, place, and time.     CMP Latest Ref Rng & Units 02/09/2021  Glucose 70 - 99 mg/dL 115(H)  BUN 8 - 23 mg/dL 29(H)  Creatinine 0.61 - 1.24 mg/dL 2.07(H)  Sodium 135 - 145 mmol/L 139  Potassium 3.5 - 5.1 mmol/L 3.7  Chloride 98 - 111 mmol/L 108  CO2 22 - 32 mmol/L 23  Calcium 8.9 - 10.3 mg/dL 8.8(L)  Total Protein 6.5 - 8.1 g/dL 7.0  Total Bilirubin 0.3 - 1.2 mg/dL 0.7  Alkaline Phos 38 - 126 U/L 59  AST 15 - 41 U/L 13(L)  ALT 0 - 44 U/L 9   CBC Latest Ref Rng & Units 02/09/2021  WBC 4.0 - 10.5 K/uL 5.7  Hemoglobin 13.0 - 17.0 g/dL 13.5  Hematocrit 39.0 - 52.0 % 41.7  Platelets 150 - 400 K/uL 214    No images are attached to the encounter.  [place] DRAIN/INJ SMALL JOINT/BURSA  Result Date: 01/22/2021 INDICATION: Cyst of the left elbow.  History of lung carcinoma. EXAM: ULTRASOUND GUIDED ASPIRATION OF LEFT ELBOW CYST MEDICATIONS: None. ANESTHESIA/SEDATION: None PROCEDURE: The procedure, risks, benefits, and alternatives were explained to the patient. Questions regarding the procedure were encouraged and answered. The patient understands and consents to the procedure. A time-out was performed prior to initiating the procedure. Subcutaneous cystic structure adjacent to the lateral aspect of the elbow joint was localized by ultrasound. The lateral left elbow region was prepped with chlorhexidine in a sterile fashion, and a  sterile drape was applied covering the operative field. A sterile gown and sterile gloves were used for the procedure. Local anesthesia was provided with 1% Lidocaine. An 18 gauge spinal needle was advanced into the cystic area. Aspiration was performed. Additional ultrasound was performed. Cyst fluid was sent for cytologic analysis. COMPLICATIONS: None immediate. FINDINGS: Well-circumscribed oval-shaped cyst along the lateral aspect of the elbow joint measures approximately 1.8 x 0.8 x 1.8 cm. Aspiration yielded 1 mL of thick, viscous yellowish fluid consistent with joint fluid. The cystic structure was nearly completely collapsed after aspiration. IMPRESSION: Aspiration of the subcutaneous cyst along the lateral aspect of the left elbow yielded viscous fluid consistent with joint fluid. Findings of the aspiration are consistent with a ganglion cyst. Aspiration of 1 mL of fluid resulted in decompression of the cyst. The cyst fluid was sent for cytologic analysis. Electronically Signed   By: Aletta Edouard M.D.   On: 01/22/2021 14:14     Assessment and plan- Patient is a 70 y.o. male with stage IIIc adenocarcinoma of the right upper lobe of the lung cT4 N3 M0.  He is here for on treatment assessment prior to cycle 14 of maintenance durvalumab  Counts okay to proceed with cycle 14 of maintenance durvalumab today and I will see him back in 2 weeks for cycle 15.  Patient is tolerating durvalumab well without any significant side effects.  Repeat CT chest abdomen pelvis with contrast has been scheduled for 02/17/2021.   Visit Diagnosis 1. Encounter for antineoplastic immunotherapy   2. Malignant neoplasm of upper lobe of left lung ([place])      Dr. Randa Evens, MD, MPH [facility] [phone] 02/09/2021 3:59 PM

## 2021-02-09 NOTE — Patient Instructions (Signed)
[facility] ONCOLOG[facility]  Discharge Instructions: Thank you for choosing [facility] to provide your oncology and hematology care.  If you have a lab appointment with the [facility], please go directly to the [facility] and check in at the registration area.  Wear comfortable clothing and clothing appropriate for easy access to any Portacath or PICC line.   We strive to give you quality time with your provider. You may need to reschedule your appointment if you arrive late (15 or more minutes).  Arriving late affects you and other patients whose appointments are after yours.  Also, if you miss three or more appointments without notifying the office, you may be dismissed from the clinic at the provider's discretion.      For prescription refill requests, have your pharmacy contact our office and allow 72 hours for refills to be completed.    Today you received the following chemotherapy and/or immunotherapy agents Durvalumab       To help prevent nausea and vomiting after your treatment, we encourage you to take your nausea medication as directed.  BELOW ARE SYMPTOMS THAT SHOULD BE REPORTED IMMEDIATELY: *FEVER GREATER THAN 100.4 F (38 C) OR HIGHER *CHILLS OR SWEATING *NAUSEA AND VOMITING THAT IS NOT CONTROLLED WITH YOUR NAUSEA MEDICATION *UNUSUAL SHORTNESS OF BREATH *UNUSUAL BRUISING OR BLEEDING *URINARY PROBLEMS (pain or burning when urinating, or frequent urination) *BOWEL PROBLEMS (unusual diarrhea, constipation, pain near the anus) TENDERNESS IN MOUTH AND THROAT WITH OR WITHOUT PRESENCE OF ULCERS (sore throat, sores in mouth, or a toothache) UNUSUAL RASH, SWELLING OR PAIN  UNUSUAL VAGINAL DISCHARGE OR ITCHING   Items with * indicate a potential emergency and should be followed up as soon as possible or go to the Emergency Department if any problems should occur.  Please show the CHEMOTHERAPY ALERT CARD or IMMUNOTHERAPY ALERT CARD at check-in  to the Emergency Department and triage nurse.  Should you have questions after your visit or need to cancel or reschedule your appointment, please contact [facility]  [phone] and follow the prompts.  Office hours are 8:00 a.m. to 4:30 p.m. Monday - Friday. Please note that voicemails left after 4:00 p.m. may not be returned until the following business day.  We are closed weekends and major holidays. You have access to a nurse at all times for urgent questions. Please call the main number to the clinic [phone] and follow the prompts.  For any non-urgent questions, you may also contact your provider using MyChart. We now offer e-Visits for anyone 14 and older to request care online for non-urgent symptoms. For details visit [email or web address].   Also download the MyChart app! Go to the app store, search "MyChart", open the app, select [facility], and log in with your MyChart username and password.  Due to Covid, a mask is required upon entering the hospital/clinic. If you do not have a mask, one will be given to you upon arrival. For doctor visits, patients may have 1 support person aged 58 or older with them. For treatment visits, patients cannot have anyone with them due to current Covid guidelines and our immunocompromised population.

## 2021-02-10 ENCOUNTER — Other Ambulatory Visit: Payer: Self-pay | Admitting: *Deleted

## 2021-02-10 DIAGNOSIS — C3401 Malignant neoplasm of right main bronchus: Secondary | ICD-10-CM

## 2021-02-10 DIAGNOSIS — N1832 Chronic kidney disease, stage 3b: Secondary | ICD-10-CM

## 2021-02-17 ENCOUNTER — Other Ambulatory Visit: Payer: Self-pay

## 2021-02-17 ENCOUNTER — Ambulatory Visit
Admission: RE | Admit: 2021-02-17 | Discharge: 2021-02-17 | Disposition: A | Discharge status: Home / Self Care | Payer: Medicare Other | Source: Ambulatory Visit | Attending: Oncology | Admitting: Oncology

## 2021-02-17 DIAGNOSIS — J90 Pleural effusion, not elsewhere classified: Secondary | ICD-10-CM | POA: Diagnosis not present

## 2021-02-17 DIAGNOSIS — C3490 Malignant neoplasm of unspecified part of unspecified bronchus or lung: Secondary | ICD-10-CM | POA: Diagnosis not present

## 2021-02-17 DIAGNOSIS — C3401 Malignant neoplasm of right main bronchus: Secondary | ICD-10-CM | POA: Insufficient documentation

## 2021-02-17 DIAGNOSIS — K8020 Calculus of gallbladder without cholecystitis without obstruction: Secondary | ICD-10-CM | POA: Diagnosis not present

## 2021-02-23 ENCOUNTER — Encounter: Payer: Self-pay | Admitting: Oncology

## 2021-02-23 ENCOUNTER — Inpatient Hospital Stay: Payer: Medicare Other | Attending: Oncology

## 2021-02-23 ENCOUNTER — Other Ambulatory Visit: Payer: Self-pay

## 2021-02-23 ENCOUNTER — Inpatient Hospital Stay: Payer: Medicare Other

## 2021-02-23 ENCOUNTER — Inpatient Hospital Stay (HOSPITAL_BASED_OUTPATIENT_CLINIC_OR_DEPARTMENT_OTHER): Payer: Medicare Other | Admitting: Oncology

## 2021-02-23 VITALS — BP 135/75 | HR 65 | Temp 97.7°F | Resp 16 | Ht 71.0 in | Wt 189.1 lb

## 2021-02-23 DIAGNOSIS — C3411 Malignant neoplasm of upper lobe, right bronchus or lung: Secondary | ICD-10-CM | POA: Insufficient documentation

## 2021-02-23 DIAGNOSIS — Z7902 Long term (current) use of antithrombotics/antiplatelets: Secondary | ICD-10-CM | POA: Diagnosis not present

## 2021-02-23 DIAGNOSIS — N1832 Chronic kidney disease, stage 3b: Secondary | ICD-10-CM

## 2021-02-23 DIAGNOSIS — Z5112 Encounter for antineoplastic immunotherapy: Secondary | ICD-10-CM | POA: Diagnosis not present

## 2021-02-23 DIAGNOSIS — I10 Essential (primary) hypertension: Secondary | ICD-10-CM | POA: Diagnosis not present

## 2021-02-23 DIAGNOSIS — Z87891 Personal history of nicotine dependence: Secondary | ICD-10-CM | POA: Insufficient documentation

## 2021-02-23 DIAGNOSIS — Z8551 Personal history of malignant neoplasm of bladder: Secondary | ICD-10-CM | POA: Diagnosis not present

## 2021-02-23 DIAGNOSIS — Z79899 Other long term (current) drug therapy: Secondary | ICD-10-CM | POA: Diagnosis not present

## 2021-02-23 DIAGNOSIS — C3401 Malignant neoplasm of right main bronchus: Secondary | ICD-10-CM

## 2021-02-23 LAB — CBC WITH DIFFERENTIAL/PLATELET
Abs Immature Granulocytes: 0.02 10*3/uL (ref 0.00–0.07)
Basophils Absolute: 0 10*3/uL (ref 0.0–0.1)
Basophils Relative: 0 %
Eosinophils Absolute: 0.2 10*3/uL (ref 0.0–0.5)
Eosinophils Relative: 4 %
HCT: 43.7 % (ref 39.0–52.0)
Hemoglobin: 14.4 g/dL (ref 13.0–17.0)
Immature Granulocytes: 0 %
Lymphocytes Relative: 21 %
Lymphs Abs: 1.1 10*3/uL (ref 0.7–4.0)
MCH: 30.8 pg (ref 26.0–34.0)
MCHC: 33 g/dL (ref 30.0–36.0)
MCV: 93.6 fL (ref 80.0–100.0)
Monocytes Absolute: 0.6 10*3/uL (ref 0.1–1.0)
Monocytes Relative: 11 %
Neutro Abs: 3.4 10*3/uL (ref 1.7–7.7)
Neutrophils Relative %: 64 %
Platelets: 186 10*3/uL (ref 150–400)
RBC: 4.67 MIL/uL (ref 4.22–5.81)
RDW: 12.7 % (ref 11.5–15.5)
WBC: 5.3 10*3/uL (ref 4.0–10.5)
nRBC: 0 % (ref 0.0–0.2)

## 2021-02-23 LAB — COMPREHENSIVE METABOLIC PANEL
ALT: 11 U/L (ref 0–44)
AST: 14 U/L — ABNORMAL LOW (ref 15–41)
Albumin: 3.7 g/dL (ref 3.5–5.0)
Alkaline Phosphatase: 52 U/L (ref 38–126)
Anion gap: 8 (ref 5–15)
BUN: 27 mg/dL — ABNORMAL HIGH (ref 8–23)
CO2: 23 mmol/L (ref 22–32)
Calcium: 8.8 mg/dL — ABNORMAL LOW (ref 8.9–10.3)
Chloride: 108 mmol/L (ref 98–111)
Creatinine, Ser: 1.97 mg/dL — ABNORMAL HIGH (ref 0.61–1.24)
GFR, Estimated: 36 mL/min — ABNORMAL LOW (ref >60)
Glucose, Bld: 117 mg/dL — ABNORMAL HIGH (ref 70–99)
Potassium: 3.7 mmol/L (ref 3.5–5.1)
Sodium: 139 mmol/L (ref 135–145)
Total Bilirubin: 0.6 mg/dL (ref 0.3–1.2)
Total Protein: 7.1 g/dL (ref 6.5–8.1)

## 2021-02-23 MED ORDER — HEPARIN SOD (PORK) LOCK FLUSH 100 UNIT/ML IV SOLN
INTRAVENOUS | Status: AC
  Filled 2021-02-23: qty 5

## 2021-02-23 MED ORDER — SODIUM CHLORIDE 0.9 % IV SOLN
Freq: Once | INTRAVENOUS | Status: AC
  Filled 2021-02-23: qty 250

## 2021-02-23 MED ORDER — HEPARIN SOD (PORK) LOCK FLUSH 100 UNIT/ML IV SOLN
500.0000 [IU] | Freq: Once | INTRAVENOUS | Status: AC
  Administered 2021-02-23: 500 [IU] via INTRAVENOUS
  Filled 2021-02-23: qty 5

## 2021-02-23 MED ORDER — HEPARIN SOD (PORK) LOCK FLUSH 100 UNIT/ML IV SOLN
500.0000 [IU] | Freq: Once | INTRAVENOUS | Status: DC | PRN
  Filled 2021-02-23: qty 5

## 2021-02-23 MED ORDER — SODIUM CHLORIDE 0.9 % IV SOLN
10.0000 mg/kg | Freq: Once | INTRAVENOUS | Status: AC
  Administered 2021-02-23: 860 mg via INTRAVENOUS
  Filled 2021-02-23: qty 10

## 2021-02-23 MED ORDER — SODIUM CHLORIDE 0.9% FLUSH
10.0000 mL | Freq: Once | INTRAVENOUS | Status: DC
  Filled 2021-02-23: qty 10

## 2021-02-23 NOTE — Progress Notes (Signed)
Pt doing well no concerns

## 2021-02-23 NOTE — Patient Instructions (Signed)
[facility] ONCOLOGY  Discharge Instructions: Thank you for choosing [facility] to provide your oncology and hematology care.  If you have a lab appointment with the [facility], please go directly to the [facility] and check in at the registration area.  Wear comfortable clothing and clothing appropriate for easy access to any Portacath or PICC line.   We strive to give you quality time with your provider. You may need to reschedule your appointment if you arrive late (15 or more minutes).  Arriving late affects you and other patients whose appointments are after yours.  Also, if you miss three or more appointments without notifying the office, you may be dismissed from the clinic at the provider's discretion.      For prescription refill requests, have your pharmacy contact our office and allow 72 hours for refills to be completed.    Today you received the following chemotherapy and/or immunotherapy agents Durvalumab   To help prevent nausea and vomiting after your treatment, we encourage you to take your nausea medication as directed.  BELOW ARE SYMPTOMS THAT SHOULD BE REPORTED IMMEDIATELY: *FEVER GREATER THAN 100.4 F (38 C) OR HIGHER *CHILLS OR SWEATING *NAUSEA AND VOMITING THAT IS NOT CONTROLLED WITH YOUR NAUSEA MEDICATION *UNUSUAL SHORTNESS OF BREATH *UNUSUAL BRUISING OR BLEEDING *URINARY PROBLEMS (pain or burning when urinating, or frequent urination) *BOWEL PROBLEMS (unusual diarrhea, constipation, pain near the anus) TENDERNESS IN MOUTH AND THROAT WITH OR WITHOUT PRESENCE OF ULCERS (sore throat, sores in mouth, or a toothache) UNUSUAL RASH, SWELLING OR PAIN  UNUSUAL VAGINAL DISCHARGE OR ITCHING   Items with * indicate a potential emergency and should be followed up as soon as possible or go to the Emergency Department if any problems should occur.  Please show the CHEMOTHERAPY ALERT CARD or IMMUNOTHERAPY ALERT CARD at check-in to  the Emergency Department and triage nurse.  Should you have questions after your visit or need to cancel or reschedule your appointment, please contact [facility]  [phone] and follow the prompts.  Office hours are 8:00 a.m. to 4:30 p.m. Monday - Friday. Please note that voicemails left after 4:00 p.m. may not be returned until the following business day.  We are closed weekends and major holidays. You have access to a nurse at all times for urgent questions. Please call the main number to the clinic [phone] and follow the prompts.  For any non-urgent questions, you may also contact your provider using MyChart. We now offer e-Visits for anyone 52 and older to request care online for non-urgent symptoms. For details visit [email or web address].   Also download the MyChart app! Go to the app store, search "MyChart", open the app, select [facility], and log in with your MyChart username and password.  Due to Covid, a mask is required upon entering the hospital/clinic. If you do not have a mask, one will be given to you upon arrival. For doctor visits, patients may have 1 support person aged 60 or older with them. For treatment visits, patients cannot have anyone with them due to current Covid guidelines and our immunocompromised population.

## 2021-02-23 NOTE — Progress Notes (Signed)
Hematology/Oncology Consult note [facility]  Telephone:([phone] [phone]  Patient Care Team: Perrin Maltese, MD as PCP - General (Internal Medicine) Telford Nab, RN as Oncology Nurse Navigator Sindy Guadeloupe, MD as Consulting Physician (Hematology and Oncology)   Name of the patient: Martin Jennings  [id number]  1951-05-27   Date of visit: 02/23/21  Diagnosis- stage IIIc adenocarcinoma of the lung cT3 cN3 M0      Chief complaint/ Reason for visit-on treatment assessment prior to cycle 14 of maintenance durvalumab  Heme/Onc history: Patient is a 70 year old male with a history of tobacco dependence who was admitted to the hospital for symptoms of acute onset shortness of breath and was found to have Covid pneumonia.  He underwent CT angio chest which showed extensive mediastinal as well as bilateral hilar adenopathy.  Masslike opacity with architectural distortion involving the right upper lobe measuring 5.7 x 2.9 x 2.3 cm and groundglass opacities bilaterally.  Mass involving the left adrenal gland as well as a smaller right adrenal gland possibly indicating adrenal metastases.     This was followed by a PET CT scan 2 weeks later which showed the consolidative process in the right upper lobe suv 7.88 concerning for infiltrating tumor.  It appears to involve the pleura associated interstitial thickening was also seen.  Bulky right hilar and mediastinal adenopathy.  Enlarged somewhat necrotic appearing left supraclavicular lymph node was also hypermetabolic.  Adrenal gland was not hypermetabolic and was consistent with a benign adenoma.  No metastatic disease involving liver.  No features of distant metastatic disease.   Pathology showed metastatic non-small cell lung cancer which was TTF-1 positive consistent with adenocarcinoma   Patient finished concurrent chemoradiation with weekly CarboTaxol.  Scan showed partial response and patient is  currently on maintenance durvalumab which was started on 07/29/2020  Interval history-patient reports doing well and denies any specific complaints at this time.  Chronic left hip pain essentially stable.  ECOG PS- 1 Pain scale- 0  Review of systems- Review of Systems  Constitutional:  Negative for chills, fever, malaise/fatigue and weight loss.  HENT:  Negative for congestion, ear discharge and nosebleeds.   Eyes:  Negative for blurred vision.  Respiratory:  Negative for cough, hemoptysis, sputum production, shortness of breath and wheezing.   Cardiovascular:  Negative for chest pain, palpitations, orthopnea and claudication.  Gastrointestinal:  Negative for abdominal pain, blood in stool, constipation, diarrhea, heartburn, melena, nausea and vomiting.  Genitourinary:  Negative for dysuria, flank pain, frequency, hematuria and urgency.  Musculoskeletal:  Negative for back pain, joint pain and myalgias.  Skin:  Negative for rash.  Neurological:  Negative for dizziness, tingling, focal weakness, seizures, weakness and headaches.  Endo/Heme/Allergies:  Does not bruise/bleed easily.  Psychiatric/Behavioral:  Negative for depression and suicidal ideas. The patient does not have insomnia.      No Known Allergies   Past Medical History:  Diagnosis Date   Bladder cancer ([place]) 2012   COVID-19 virus infection 04/03/2020   Hypertension    Lung cancer ([place])    Peripheral vascular disease ([place])      Past Surgical History:  Procedure Laterality Date   BLADDER REMOVAL     LOWER EXTREMITY ANGIOGRAPHY Left 04/12/2017   Procedure: Lower Extremity Angiography;  Surgeon: Katha Cabal, MD;  Location: [place] [facility];  Service: Cardiovascular;  Laterality: Left;   PORTA CATH INSERTION N/A 05/22/2020   Procedure: PORTA CATH INSERTION;  Surgeon: Algernon Huxley,  MD;  Location: [place] [facility];  Service: Cardiovascular;  Laterality: N/A;   uretostomy     VASCULAR SURGERY      Social  History   Socioeconomic History   Marital status: Married    Spouse name: Not on file   Number of children: Not on file   Years of education: Not on file   Highest education level: Not on file  Occupational History   Not on file  Tobacco Use   Smoking status: Former    Packs/day: 1.25    Years: 50.00    Pack years: 62.50    Types: Cigarettes    Quit date: 03/13/2020    Years since quitting: 0.9   Smokeless tobacco: Never  Vaping Use   Vaping Use: Never used  Substance and Sexual Activity   Alcohol use: No   Drug use: Not Currently    Types: Marijuana   Sexual activity: Not Currently  Other Topics Concern   Not on file  Social History Narrative   Not on file   Social Determinants of Health   Financial Resource Strain: Not on file  Food Insecurity: Not on file  Transportation Needs: Not on file  Physical Activity: Not on file  Stress: Not on file  Social Connections: Not on file  Intimate Partner Violence: Not on file    History reviewed. No pertinent family history.   Current Outpatient Medications:    amLODipine (NORVASC) 10 MG tablet, Take 10 mg by mouth daily. , Disp: , Rfl:    atorvastatin (LIPITOR) 20 MG tablet, Take 20 mg by mouth every evening. , Disp: , Rfl:    clopidogrel (PLAVIX) 75 MG tablet, Take 75 mg by mouth daily. , Disp: , Rfl:    oxyCODONE (OXY IR/ROXICODONE) 5 MG immediate release tablet, Take 1 tablet (5 mg total) by mouth every 6 (six) hours as needed for severe pain., Disp: 45 tablet, Rfl: 0 No current facility-administered medications for this visit.  Facility-Administered Medications Ordered in Other Visits:    durvalumab (IMFINZI) 860 mg in sodium chloride 0.9 % 100 mL chemo infusion, 10 mg/kg (Order-Specific), Intravenous, Once, Sindy Guadeloupe, MD, Last Rate: 117 mL/hr at 02/23/21 1002, 860 mg at 02/23/21 1002   heparin lock flush 100 unit/mL, 500 Units, Intravenous, Once, Sindy Guadeloupe, MD   heparin lock flush 100 unit/mL, 500 Units,  Intracatheter, Once PRN, Sindy Guadeloupe, MD   sodium chloride flush (NS) 0.9 % injection 10 mL, 10 mL, Intravenous, PRN, Sindy Guadeloupe, MD, 10 mL at 09/18/20 0850   sodium chloride flush (NS) 0.9 % injection 10 mL, 10 mL, Intravenous, Once, Sindy Guadeloupe, MD  Physical exam:  Vitals:   02/23/21 0831  BP: 135/75  Pulse: 65  Resp: 16  Temp: 97.7 F (36.5 C)  TempSrc: Oral  Weight: 189 lb 1.6 oz (85.8 kg)  Height: 5\' 11"  (1.803 m)   Physical Exam Constitutional:      General: He is not in acute distress. Cardiovascular:     Rate and Rhythm: Normal rate and regular rhythm.     Heart sounds: Normal heart sounds.  Pulmonary:     Effort: Pulmonary effort is normal.     Breath sounds: Normal breath sounds.  Abdominal:     General: Bowel sounds are normal.     Palpations: Abdomen is soft.  Skin:    General: Skin is warm and dry.  Neurological:     Mental Status: He is alert and  oriented to person, place, and time.     CMP Latest Ref Rng & Units 02/23/2021  Glucose 70 - 99 mg/dL 117(H)  BUN 8 - 23 mg/dL 27(H)  Creatinine 0.61 - 1.24 mg/dL 1.97(H)  Sodium 135 - 145 mmol/L 139  Potassium 3.5 - 5.1 mmol/L 3.7  Chloride 98 - 111 mmol/L 108  CO2 22 - 32 mmol/L 23  Calcium 8.9 - 10.3 mg/dL 8.8(L)  Total Protein 6.5 - 8.1 g/dL 7.1  Total Bilirubin 0.3 - 1.2 mg/dL 0.6  Alkaline Phos 38 - 126 U/L 52  AST 15 - 41 U/L 14(L)  ALT 0 - 44 U/L 11   CBC Latest Ref Rng & Units 02/23/2021  WBC 4.0 - 10.5 K/uL 5.3  Hemoglobin 13.0 - 17.0 g/dL 14.4  Hematocrit 39.0 - 52.0 % 43.7  Platelets 150 - 400 K/uL 186    No images are attached to the encounter.  CT CHEST ABDOMEN PELVIS WO CONTRAST  Result Date: 02/18/2021 CLINICAL DATA:  Right lung cancer restaging, additional history of bladder cancer status post cysto prostatectomy and ileal conduit urinary diversion. EXAM: CT CHEST, ABDOMEN AND PELVIS WITHOUT CONTRAST TECHNIQUE: Multidetector CT imaging of the chest, abdomen and pelvis was  performed following the standard protocol without IV contrast. COMPARISON:  11/03/2020 FINDINGS: CT CHEST FINDINGS Cardiovascular: Aortic atherosclerosis. Normal heart size. Three-vessel coronary artery calcifications. No pericardial effusion. Mediastinum/Nodes: Interval decrease in size of enlarged pretracheal lymph node, measuring 1.0 x 0.9 cm, previously 1.9 x 1.7 cm (series 2, image 20). Unchanged, post treatment appearance of soft tissue about the right hilum. Small hiatal hernia. Thyroid gland, trachea, and esophagus demonstrate no significant findings. Lungs/Pleura: Small right pleural effusion, slightly decreased compared to prior examination. Otherwise unchanged post treatment appearance of the chest, with generally bandlike consolidation of the apical right upper lobe (series 3, image 40). Background of moderate centrilobular emphysema and mild pulmonary fibrosis in a pattern with apical to basal gradient, featuring irregular peripheral interstitial opacity, septal thickening, and scattered areas of subpleural bronchiolectasis without clear evidence of honeycombing. Musculoskeletal: No chest wall mass or suspicious bone lesions identified. CT ABDOMEN PELVIS FINDINGS Hepatobiliary: No solid liver abnormality is seen. Small gallstone in the gallbladder fundus. No gallbladder wall thickening, or biliary dilatation. Pancreas: Unremarkable. No pancreatic ductal dilatation or surrounding inflammatory changes. Spleen: Normal in size without significant abnormality. Adrenals/Urinary Tract: Unchanged definitively benign fatty attenuation left adrenal adenoma (series 2, image 66) and benign bilateral fatty attenuation non nodular adenomatous thickening of the adrenals. Kidneys are normal, without renal calculi, solid lesion, or hydronephrosis. Status post cystoprostatectomy with right lower quadrant ileal conduit urinary diversion. Stomach/Bowel: Stomach is within normal limits. Appendix appears normal. No evidence  of bowel wall thickening, distention, or inflammatory changes. Vascular/Lymphatic: Aortic atherosclerosis. Left renal artery stent. Bilateral common and external iliac artery stents with femoral femoral bypass graft. No enlarged abdominal or pelvic lymph nodes. Reproductive: No mass or other abnormality. Other: No abdominal wall hernia or abnormality. No abdominopelvic ascites. Musculoskeletal: No acute or significant osseous findings. IMPRESSION: 1. Unchanged post treatment appearance of the chest, generally bandlike post treatment/post radiation consolidation of the apical right upper lobe. 2. Small right pleural effusion, slightly decreased compared to prior examination. 3. Interval decrease in size of enlarged pretracheal lymph node, consistent with treatment response of nodal metastatic disease. Unchanged, post treatment appearance of soft tissue about the right hilum. 4. Small right pleural effusion, slightly decreased compared to prior examination. 5. Moderate emphysema. 6. Mild pulmonary fibrosis in  a pattern with apical to basal gradient, featuring irregular peripheral interstitial opacity, septal thickening, and scattered areas of subpleural bronchiolectasis without clear evidence of honeycombing. Although not optimally characterize in the presence of pleural effusion on this non tailored examination, findings are most consistent with a probable UIP pattern of fibrosis. 7. Status post cystoprostatectomy with right lower quadrant ileal conduit urinary diversion. 8. No noncontrast evidence of lymphadenopathy or metastatic disease in the abdomen or pelvis. 9. Coronary artery disease. Aortic Atherosclerosis (ICD10-I70.0) and Emphysema (ICD10-J43.9). Electronically Signed   By: Eddie Candle M.D.   On: 02/18/2021 09:59     Assessment and plan- Patient is a 70 y.o. male with stage IIIc adenocarcinoma of the right upper lobe of the lung cT4 N3 M0.  He is here for on treatment assessment prior to cycle 15 of  maintenance durvalumab   I have reviewed CT chest abdomen pelvis images independently and discussed findings with the patient which overall shows stable to improving disease.  Decreasing size of Plan is to complete durvalumab for 1 year ending and January 2022.  Counts otherwise okay to proceed with cycle 15 of maintenance durvalumab today and I will see him back in 2 weeks for cycle 16 mediastinal adenopathy.  Radiation changes noted in left upper lobe.  No evidence of recurrent or progressive disease.   Visit Diagnosis 1. Encounter for antineoplastic immunotherapy   2. Malignant neoplasm of upper lobe of right lung ([place])   3. Stage 3b chronic kidney disease ([place])      Dr. Randa Evens, MD, MPH [facility] [phone] 02/23/2021 10:39 AM

## 2021-02-23 NOTE — Progress Notes (Signed)
Labs reviewed with MD and treatment team. Per MD to proceed with treatment. Treatment team updated.   Martin Jennings [employer]

## 2021-03-09 ENCOUNTER — Inpatient Hospital Stay: Payer: Medicare Other

## 2021-03-09 ENCOUNTER — Inpatient Hospital Stay (HOSPITAL_BASED_OUTPATIENT_CLINIC_OR_DEPARTMENT_OTHER): Payer: Medicare Other | Admitting: Oncology

## 2021-03-09 ENCOUNTER — Encounter: Payer: Self-pay | Admitting: Oncology

## 2021-03-09 ENCOUNTER — Other Ambulatory Visit: Payer: Self-pay

## 2021-03-09 VITALS — BP 125/71 | HR 80 | Temp 98.5°F | Resp 20 | Wt 189.3 lb

## 2021-03-09 DIAGNOSIS — Z5112 Encounter for antineoplastic immunotherapy: Secondary | ICD-10-CM | POA: Diagnosis not present

## 2021-03-09 DIAGNOSIS — Z8551 Personal history of malignant neoplasm of bladder: Secondary | ICD-10-CM | POA: Diagnosis not present

## 2021-03-09 DIAGNOSIS — C3412 Malignant neoplasm of upper lobe, left bronchus or lung: Secondary | ICD-10-CM | POA: Diagnosis not present

## 2021-03-09 DIAGNOSIS — C3411 Malignant neoplasm of upper lobe, right bronchus or lung: Secondary | ICD-10-CM | POA: Diagnosis not present

## 2021-03-09 DIAGNOSIS — Z7902 Long term (current) use of antithrombotics/antiplatelets: Secondary | ICD-10-CM | POA: Diagnosis not present

## 2021-03-09 DIAGNOSIS — Z79899 Other long term (current) drug therapy: Secondary | ICD-10-CM | POA: Diagnosis not present

## 2021-03-09 DIAGNOSIS — I10 Essential (primary) hypertension: Secondary | ICD-10-CM | POA: Diagnosis not present

## 2021-03-09 DIAGNOSIS — Z87891 Personal history of nicotine dependence: Secondary | ICD-10-CM | POA: Diagnosis not present

## 2021-03-09 DIAGNOSIS — N1832 Chronic kidney disease, stage 3b: Secondary | ICD-10-CM | POA: Diagnosis not present

## 2021-03-09 DIAGNOSIS — C3401 Malignant neoplasm of right main bronchus: Secondary | ICD-10-CM

## 2021-03-09 LAB — COMPREHENSIVE METABOLIC PANEL
ALT: 9 U/L (ref 0–44)
AST: 14 U/L — ABNORMAL LOW (ref 15–41)
Albumin: 3.6 g/dL (ref 3.5–5.0)
Alkaline Phosphatase: 53 U/L (ref 38–126)
Anion gap: 9 (ref 5–15)
BUN: 31 mg/dL — ABNORMAL HIGH (ref 8–23)
CO2: 22 mmol/L (ref 22–32)
Calcium: 8.6 mg/dL — ABNORMAL LOW (ref 8.9–10.3)
Chloride: 106 mmol/L (ref 98–111)
Creatinine, Ser: 1.97 mg/dL — ABNORMAL HIGH (ref 0.61–1.24)
GFR, Estimated: 36 mL/min — ABNORMAL LOW (ref >60)
Glucose, Bld: 128 mg/dL — ABNORMAL HIGH (ref 70–99)
Potassium: 3.4 mmol/L — ABNORMAL LOW (ref 3.5–5.1)
Sodium: 137 mmol/L (ref 135–145)
Total Bilirubin: 0.7 mg/dL (ref 0.3–1.2)
Total Protein: 7.1 g/dL (ref 6.5–8.1)

## 2021-03-09 LAB — CBC WITH DIFFERENTIAL/PLATELET
Abs Immature Granulocytes: 0.04 10*3/uL (ref 0.00–0.07)
Basophils Absolute: 0 10*3/uL (ref 0.0–0.1)
Basophils Relative: 0 %
Eosinophils Absolute: 0.3 10*3/uL (ref 0.0–0.5)
Eosinophils Relative: 5 %
HCT: 40.7 % (ref 39.0–52.0)
Hemoglobin: 13.8 g/dL (ref 13.0–17.0)
Immature Granulocytes: 1 %
Lymphocytes Relative: 13 %
Lymphs Abs: 0.9 10*3/uL (ref 0.7–4.0)
MCH: 31.3 pg (ref 26.0–34.0)
MCHC: 33.9 g/dL (ref 30.0–36.0)
MCV: 92.3 fL (ref 80.0–100.0)
Monocytes Absolute: 0.7 10*3/uL (ref 0.1–1.0)
Monocytes Relative: 10 %
Neutro Abs: 4.9 10*3/uL (ref 1.7–7.7)
Neutrophils Relative %: 71 %
Platelets: 190 10*3/uL (ref 150–400)
RBC: 4.41 MIL/uL (ref 4.22–5.81)
RDW: 12.6 % (ref 11.5–15.5)
WBC: 6.9 10*3/uL (ref 4.0–10.5)
nRBC: 0 % (ref 0.0–0.2)

## 2021-03-09 MED ORDER — LIDOCAINE-PRILOCAINE 2.5-2.5 % EX CREA: 1.0000 "application " | TOPICAL_CREAM | CUTANEOUS | 2 refills | Status: DC | PRN

## 2021-03-09 MED ORDER — DURVALUMAB 500 MG/10ML IV SOLN
10.0000 mg/kg | Freq: Once | INTRAVENOUS | Status: AC
  Administered 2021-03-09: 860 mg via INTRAVENOUS
  Filled 2021-03-09: qty 10

## 2021-03-09 MED ORDER — HEPARIN SOD (PORK) LOCK FLUSH 100 UNIT/ML IV SOLN
500.0000 [IU] | Freq: Once | INTRAVENOUS | Status: AC | PRN
  Filled 2021-03-09: qty 5

## 2021-03-09 MED ORDER — SODIUM CHLORIDE 0.9 % IV SOLN
Freq: Once | INTRAVENOUS | Status: AC
  Filled 2021-03-09: qty 250

## 2021-03-09 MED ORDER — HEPARIN SOD (PORK) LOCK FLUSH 100 UNIT/ML IV SOLN
INTRAVENOUS | Status: AC
  Administered 2021-03-09: 500 [IU]
  Filled 2021-03-09: qty 5

## 2021-03-09 MED ORDER — SODIUM CHLORIDE 0.9% FLUSH
10.0000 mL | Freq: Once | INTRAVENOUS | Status: AC
  Administered 2021-03-09: 10 mL via INTRAVENOUS
  Filled 2021-03-09: qty 10

## 2021-03-09 NOTE — Progress Notes (Signed)
Cr 1.97 ok to proceed per MD

## 2021-03-09 NOTE — Progress Notes (Signed)
Hematology/Oncology Consult note [facility]  Telephone:([phone] [phone]  Patient Care Team: Perrin Maltese, MD as PCP - General (Internal Medicine) Telford Nab, RN as Oncology Nurse Navigator Sindy Guadeloupe, MD as Consulting Physician (Hematology and Oncology)   Name of the patient: Martin Jennings  [id number]  1950/12/25   Date of visit: 03/09/21  Diagnosis- stage IIIc adenocarcinoma of the lung cT3 cN3 M0  Chief complaint/ Reason for visit-on treatment assessment prior to cycle 15 of maintenance durvalumab  Heme/Onc history: Patient is a 70 year old male with a history of tobacco dependence who was admitted to the hospital for symptoms of acute onset shortness of breath and was found to have Covid pneumonia.  He underwent CT angio chest which showed extensive mediastinal as well as bilateral hilar adenopathy.  Masslike opacity with architectural distortion involving the right upper lobe measuring 5.7 x 2.9 x 2.3 cm and groundglass opacities bilaterally.  Mass involving the left adrenal gland as well as a smaller right adrenal gland possibly indicating adrenal metastases.     This was followed by a PET CT scan 2 weeks later which showed the consolidative process in the right upper lobe suv 7.88 concerning for infiltrating tumor.  It appears to involve the pleura associated interstitial thickening was also seen.  Bulky right hilar and mediastinal adenopathy.  Enlarged somewhat necrotic appearing left supraclavicular lymph node was also hypermetabolic.  Adrenal gland was not hypermetabolic and was consistent with a benign adenoma.  No metastatic disease involving liver.  No features of distant metastatic disease.   Pathology showed metastatic non-small cell lung cancer which was TTF-1 positive consistent with adenocarcinoma   Patient finished concurrent chemoradiation with weekly CarboTaxol.  Scan showed partial response and patient is currently on  maintenance durvalumab which was started on 07/29/2020    Interval history-patient is doing well presently and denies any specific complaints at this time other than baseline fatigue.  He has occasional left hip pain for which she uses as needed oxycodone.  ECOG PS- 1 Pain scale- 0   Review of systems- Review of Systems  Constitutional:  Negative for chills, fever, malaise/fatigue and weight loss.  HENT:  Negative for congestion, ear discharge and nosebleeds.   Eyes:  Negative for blurred vision.  Respiratory:  Negative for cough, hemoptysis, sputum production, shortness of breath and wheezing.   Cardiovascular:  Negative for chest pain, palpitations, orthopnea and claudication.  Gastrointestinal:  Negative for abdominal pain, blood in stool, constipation, diarrhea, heartburn, melena, nausea and vomiting.  Genitourinary:  Negative for dysuria, flank pain, frequency, hematuria and urgency.  Musculoskeletal:  Negative for back pain, joint pain and myalgias.  Skin:  Negative for rash.  Neurological:  Negative for dizziness, tingling, focal weakness, seizures, weakness and headaches.  Endo/Heme/Allergies:  Does not bruise/bleed easily.  Psychiatric/Behavioral:  Negative for depression and suicidal ideas. The patient does not have insomnia.      No Known Allergies   Past Medical History:  Diagnosis Date   Bladder cancer ([place]) 2012   COVID-19 virus infection 04/03/2020   Hypertension    Lung cancer ([place])    Peripheral vascular disease ([place])      Past Surgical History:  Procedure Laterality Date   BLADDER REMOVAL     LOWER EXTREMITY ANGIOGRAPHY Left 04/12/2017   Procedure: Lower Extremity Angiography;  Surgeon: Katha Cabal, MD;  Location: [place];  Service: Cardiovascular;  Laterality: Left;   PORTA CATH INSERTION N/A 05/22/2020  Procedure: PORTA CATH INSERTION;  Surgeon: Algernon Huxley, MD;  Location: [facility];  Service: Cardiovascular;  Laterality: N/A;    uretostomy     VASCULAR SURGERY      Social History   Socioeconomic History   Marital status: Married    Spouse name: Not on file   Number of children: Not on file   Years of education: Not on file   Highest education level: Not on file  Occupational History   Not on file  Tobacco Use   Smoking status: Former    Packs/day: 1.25    Years: 50.00    Pack years: 62.50    Types: Cigarettes    Quit date: 03/13/2020    Years since quitting: 0.9   Smokeless tobacco: Never  Vaping Use   Vaping Use: Never used  Substance and Sexual Activity   Alcohol use: No   Drug use: Not Currently    Types: Marijuana   Sexual activity: Not Currently  Other Topics Concern   Not on file  Social History Narrative   Not on file   Social Determinants of Health   Financial Resource Strain: Not on file  Food Insecurity: Not on file  Transportation Needs: Not on file  Physical Activity: Not on file  Stress: Not on file  Social Connections: Not on file  Intimate Partner Violence: Not on file    History reviewed. No pertinent family history.   Current Outpatient Medications:    amLODipine (NORVASC) 10 MG tablet, Take 10 mg by mouth daily. , Disp: , Rfl:    atorvastatin (LIPITOR) 20 MG tablet, Take 20 mg by mouth every evening. , Disp: , Rfl:    clopidogrel (PLAVIX) 75 MG tablet, Take 75 mg by mouth daily. , Disp: , Rfl:    oxyCODONE (OXY IR/ROXICODONE) 5 MG immediate release tablet, Take 1 tablet (5 mg total) by mouth every 6 (six) hours as needed for severe pain., Disp: 45 tablet, Rfl: 0   lidocaine-prilocaine (EMLA) cream, Apply 1 application topically as needed., Disp: 30 g, Rfl: 2 No current facility-administered medications for this visit.  Facility-Administered Medications Ordered in Other Visits:    sodium chloride flush (NS) 0.9 % injection 10 mL, 10 mL, Intravenous, PRN, Sindy Guadeloupe, MD, 10 mL at 09/18/20 0850  Physical exam:  Vitals:   03/09/21 1042  BP: 125/71  Pulse: 80   Resp: 20  Temp: 98.5 F (36.9 C)  TempSrc: Tympanic  SpO2: 96%  Weight: 189 lb 4.8 oz (85.9 kg)   Physical Exam Cardiovascular:     Rate and Rhythm: Normal rate and regular rhythm.     Heart sounds: Normal heart sounds.  Pulmonary:     Effort: Pulmonary effort is normal.     Breath sounds: Normal breath sounds.  Abdominal:     General: Bowel sounds are normal.     Palpations: Abdomen is soft.  Skin:    General: Skin is warm and dry.  Neurological:     Mental Status: He is alert and oriented to person, place, and time.     CMP Latest Ref Rng & Units 03/09/2021  Glucose 70 - 99 mg/dL 128(H)  BUN 8 - 23 mg/dL 31(H)  Creatinine 0.61 - 1.24 mg/dL 1.97(H)  Sodium 135 - 145 mmol/L 137  Potassium 3.5 - 5.1 mmol/L 3.4(L)  Chloride 98 - 111 mmol/L 106  CO2 22 - 32 mmol/L 22  Calcium 8.9 - 10.3 mg/dL 8.6(L)  Total Protein 6.5 -  8.1 g/dL 7.1  Total Bilirubin 0.3 - 1.2 mg/dL 0.7  Alkaline Phos 38 - 126 U/L 53  AST 15 - 41 U/L 14(L)  ALT 0 - 44 U/L 9   CBC Latest Ref Rng & Units 03/09/2021  WBC 4.0 - 10.5 K/uL 6.9  Hemoglobin 13.0 - 17.0 g/dL 13.8  Hematocrit 39.0 - 52.0 % 40.7  Platelets 150 - 400 K/uL 190    No images are attached to the encounter.  CT CHEST ABDOMEN PELVIS WO CONTRAST  Result Date: 02/18/2021 CLINICAL DATA:  Right lung cancer restaging, additional history of bladder cancer status post cysto prostatectomy and ileal conduit urinary diversion. EXAM: CT CHEST, ABDOMEN AND PELVIS WITHOUT CONTRAST TECHNIQUE: Multidetector CT imaging of the chest, abdomen and pelvis was performed following the standard protocol without IV contrast. COMPARISON:  11/03/2020 FINDINGS: CT CHEST FINDINGS Cardiovascular: Aortic atherosclerosis. Normal heart size. Three-vessel coronary artery calcifications. No pericardial effusion. Mediastinum/Nodes: Interval decrease in size of enlarged pretracheal lymph node, measuring 1.0 x 0.9 cm, previously 1.9 x 1.7 cm (series 2, image 20). Unchanged,  post treatment appearance of soft tissue about the right hilum. Small hiatal hernia. Thyroid gland, trachea, and esophagus demonstrate no significant findings. Lungs/Pleura: Small right pleural effusion, slightly decreased compared to prior examination. Otherwise unchanged post treatment appearance of the chest, with generally bandlike consolidation of the apical right upper lobe (series 3, image 40). Background of moderate centrilobular emphysema and mild pulmonary fibrosis in a pattern with apical to basal gradient, featuring irregular peripheral interstitial opacity, septal thickening, and scattered areas of subpleural bronchiolectasis without clear evidence of honeycombing. Musculoskeletal: No chest wall mass or suspicious bone lesions identified. CT ABDOMEN PELVIS FINDINGS Hepatobiliary: No solid liver abnormality is seen. Small gallstone in the gallbladder fundus. No gallbladder wall thickening, or biliary dilatation. Pancreas: Unremarkable. No pancreatic ductal dilatation or surrounding inflammatory changes. Spleen: Normal in size without significant abnormality. Adrenals/Urinary Tract: Unchanged definitively benign fatty attenuation left adrenal adenoma (series 2, image 66) and benign bilateral fatty attenuation non nodular adenomatous thickening of the adrenals. Kidneys are normal, without renal calculi, solid lesion, or hydronephrosis. Status post cystoprostatectomy with right lower quadrant ileal conduit urinary diversion. Stomach/Bowel: Stomach is within normal limits. Appendix appears normal. No evidence of bowel wall thickening, distention, or inflammatory changes. Vascular/Lymphatic: Aortic atherosclerosis. Left renal artery stent. Bilateral common and external iliac artery stents with femoral femoral bypass graft. No enlarged abdominal or pelvic lymph nodes. Reproductive: No mass or other abnormality. Other: No abdominal wall hernia or abnormality. No abdominopelvic ascites. Musculoskeletal: No acute  or significant osseous findings. IMPRESSION: 1. Unchanged post treatment appearance of the chest, generally bandlike post treatment/post radiation consolidation of the apical right upper lobe. 2. Small right pleural effusion, slightly decreased compared to prior examination. 3. Interval decrease in size of enlarged pretracheal lymph node, consistent with treatment response of nodal metastatic disease. Unchanged, post treatment appearance of soft tissue about the right hilum. 4. Small right pleural effusion, slightly decreased compared to prior examination. 5. Moderate emphysema. 6. Mild pulmonary fibrosis in a pattern with apical to basal gradient, featuring irregular peripheral interstitial opacity, septal thickening, and scattered areas of subpleural bronchiolectasis without clear evidence of honeycombing. Although not optimally characterize in the presence of pleural effusion on this non tailored examination, findings are most consistent with a probable UIP pattern of fibrosis. 7. Status post cystoprostatectomy with right lower quadrant ileal conduit urinary diversion. 8. No noncontrast evidence of lymphadenopathy or metastatic disease in the abdomen or pelvis. 9.  Coronary artery disease. Aortic Atherosclerosis (ICD10-I70.0) and Emphysema (ICD10-J43.9). Electronically Signed   By: Eddie Candle M.D.   On: 02/18/2021 09:59     Assessment and plan- Patient is a 70 y.o. male with stage IIIc adenocarcinoma of the right upper lobe of the lung cT4 N3 M0.  He is here for on treatment assessment prior to cycle 16 of maintenance durvalumab  Counts okay to proceed with cycle 16 of maintenance durvalumab and I will see him back in 2 weeks for cycle 17.  He will complete 1 year of treatment ending in January 2022.  Tolerating treatment well so far without any significant side effects.  TSH is normal.CT chest abdomen and pelvis without contrast on 02/17/2021 showed continued response to treatment and decrease in the size  of pretracheal adenopathy.   Visit Diagnosis 1. Encounter for antineoplastic immunotherapy   2. Malignant neoplasm of upper lobe of left lung ([place])      Dr. Randa Evens, MD, MPH [facility] [phone] 03/09/2021 4:52 PM

## 2021-03-09 NOTE — Patient Instructions (Signed)
[facility]  Discharge Instructions: Thank you for choosing [facility] to provide your oncology and hematology care.  If you have a lab appointment with the [facility], please go directly to the [facility] and check in at the registration area.  Wear comfortable clothing and clothing appropriate for easy access to any Portacath or PICC line.   We strive to give you quality time with your provider. You may need to reschedule your appointment if you arrive late (15 or more minutes).  Arriving late affects you and other patients whose appointments are after yours.  Also, if you miss three or more appointments without notifying the office, you may be dismissed from the clinic at the provider's discretion.      For prescription refill requests, have your pharmacy contact our office and allow 72 hours for refills to be completed.    Today you received the following chemotherapy and/or immunotherapy agents Durvalumab       To help prevent nausea and vomiting after your treatment, we encourage you to take your nausea medication as directed.  BELOW ARE SYMPTOMS THAT SHOULD BE REPORTED IMMEDIATELY: *FEVER GREATER THAN 100.4 F (38 C) OR HIGHER *CHILLS OR SWEATING *NAUSEA AND VOMITING THAT IS NOT CONTROLLED WITH YOUR NAUSEA MEDICATION *UNUSUAL SHORTNESS OF BREATH *UNUSUAL BRUISING OR BLEEDING *URINARY PROBLEMS (pain or burning when urinating, or frequent urination) *BOWEL PROBLEMS (unusual diarrhea, constipation, pain near the anus) TENDERNESS IN MOUTH AND THROAT WITH OR WITHOUT PRESENCE OF ULCERS (sore throat, sores in mouth, or a toothache) UNUSUAL RASH, SWELLING OR PAIN  UNUSUAL VAGINAL DISCHARGE OR ITCHING   Items with * indicate a potential emergency and should be followed up as soon as possible or go to the Emergency Department if any problems should occur.  Please show the CHEMOTHERAPY ALERT CARD or IMMUNOTHERAPY ALERT CARD at check-in  to the Emergency Department and triage nurse.  Should you have questions after your visit or need to cancel or reschedule your appointment, please contact [facility]  [phone] and follow the prompts.  Office hours are 8:00 a.m. to 4:30 p.m. Monday - Friday. Please note that voicemails left after 4:00 p.m. may not be returned until the following business day.  We are closed weekends and major holidays. You have access to a nurse at all times for urgent questions. Please call the main number to the clinic [phone] and follow the prompts.  For any non-urgent questions, you may also contact your provider using MyChart. We now offer e-Visits for anyone 56 and older to request care online for non-urgent symptoms. For details visit [email or web address].   Also download the MyChart app! Go to the app store, search "MyChart", open the app, select [facility], and log in with your MyChart username and password.  Due to Covid, a mask is required upon entering the hospital/clinic. If you do not have a mask, one will be given to you upon arrival. For doctor visits, patients may have 1 support person aged 58 or older with them. For treatment visits, patients cannot have anyone with them due to current Covid guidelines and our immunocompromised population.

## 2021-03-09 NOTE — Progress Notes (Signed)
Okay to proceed with Creatinine 1.97 per Dr. Janese Banks.

## 2021-03-17 ENCOUNTER — Other Ambulatory Visit: Payer: Self-pay | Admitting: *Deleted

## 2021-03-17 DIAGNOSIS — E782 Mixed hyperlipidemia: Secondary | ICD-10-CM | POA: Diagnosis not present

## 2021-03-17 DIAGNOSIS — G4700 Insomnia, unspecified: Secondary | ICD-10-CM | POA: Diagnosis not present

## 2021-03-17 DIAGNOSIS — N189 Chronic kidney disease, unspecified: Secondary | ICD-10-CM | POA: Diagnosis not present

## 2021-03-17 DIAGNOSIS — R7303 Prediabetes: Secondary | ICD-10-CM | POA: Diagnosis not present

## 2021-03-17 DIAGNOSIS — I10 Essential (primary) hypertension: Secondary | ICD-10-CM | POA: Diagnosis not present

## 2021-03-17 DIAGNOSIS — E559 Vitamin D deficiency, unspecified: Secondary | ICD-10-CM | POA: Diagnosis not present

## 2021-03-17 DIAGNOSIS — C3490 Malignant neoplasm of unspecified part of unspecified bronchus or lung: Secondary | ICD-10-CM | POA: Diagnosis not present

## 2021-03-17 MED ORDER — OXYCODONE HCL 5 MG PO TABS: 5.0000 mg | ORAL_TABLET | Freq: Four times a day (QID) | ORAL | 0 refills | Status: DC | PRN

## 2021-03-17 NOTE — Telephone Encounter (Signed)
Pt need refill of med for pain

## 2021-03-21 DIAGNOSIS — R7303 Prediabetes: Secondary | ICD-10-CM | POA: Insufficient documentation

## 2021-03-23 ENCOUNTER — Inpatient Hospital Stay: Payer: Medicare Other

## 2021-03-23 ENCOUNTER — Inpatient Hospital Stay: Payer: Medicare Other | Admitting: Oncology

## 2021-03-24 ENCOUNTER — Inpatient Hospital Stay: Payer: Medicare Other

## 2021-03-24 ENCOUNTER — Inpatient Hospital Stay: Payer: Medicare Other | Admitting: Oncology

## 2021-03-24 ENCOUNTER — Encounter: Payer: Self-pay | Admitting: Oncology

## 2021-03-24 ENCOUNTER — Inpatient Hospital Stay (HOSPITAL_BASED_OUTPATIENT_CLINIC_OR_DEPARTMENT_OTHER): Payer: Medicare Other | Admitting: Oncology

## 2021-03-24 VITALS — BP 130/76 | HR 77 | Temp 97.6°F | Resp 17 | Wt 186.0 lb

## 2021-03-24 DIAGNOSIS — Z5112 Encounter for antineoplastic immunotherapy: Secondary | ICD-10-CM

## 2021-03-24 DIAGNOSIS — C3401 Malignant neoplasm of right main bronchus: Secondary | ICD-10-CM

## 2021-03-24 DIAGNOSIS — I10 Essential (primary) hypertension: Secondary | ICD-10-CM | POA: Diagnosis not present

## 2021-03-24 DIAGNOSIS — Z7902 Long term (current) use of antithrombotics/antiplatelets: Secondary | ICD-10-CM | POA: Diagnosis not present

## 2021-03-24 DIAGNOSIS — N1832 Chronic kidney disease, stage 3b: Secondary | ICD-10-CM | POA: Diagnosis not present

## 2021-03-24 DIAGNOSIS — Z87891 Personal history of nicotine dependence: Secondary | ICD-10-CM | POA: Diagnosis not present

## 2021-03-24 DIAGNOSIS — C3411 Malignant neoplasm of upper lobe, right bronchus or lung: Secondary | ICD-10-CM | POA: Diagnosis not present

## 2021-03-24 DIAGNOSIS — Z79899 Other long term (current) drug therapy: Secondary | ICD-10-CM | POA: Diagnosis not present

## 2021-03-24 DIAGNOSIS — Z8551 Personal history of malignant neoplasm of bladder: Secondary | ICD-10-CM | POA: Diagnosis not present

## 2021-03-24 LAB — COMPREHENSIVE METABOLIC PANEL
ALT: 10 U/L (ref 0–44)
AST: 13 U/L — ABNORMAL LOW (ref 15–41)
Albumin: 3.7 g/dL (ref 3.5–5.0)
Alkaline Phosphatase: 57 U/L (ref 38–126)
Anion gap: 7 (ref 5–15)
BUN: 26 mg/dL — ABNORMAL HIGH (ref 8–23)
CO2: 25 mmol/L (ref 22–32)
Calcium: 8.8 mg/dL — ABNORMAL LOW (ref 8.9–10.3)
Chloride: 105 mmol/L (ref 98–111)
Creatinine, Ser: 1.8 mg/dL — ABNORMAL HIGH (ref 0.61–1.24)
GFR, Estimated: 40 mL/min — ABNORMAL LOW (ref >60)
Glucose, Bld: 116 mg/dL — ABNORMAL HIGH (ref 70–99)
Potassium: 3.8 mmol/L (ref 3.5–5.1)
Sodium: 137 mmol/L (ref 135–145)
Total Bilirubin: 0.5 mg/dL (ref 0.3–1.2)
Total Protein: 7.4 g/dL (ref 6.5–8.1)

## 2021-03-24 LAB — CBC WITH DIFFERENTIAL/PLATELET
Abs Immature Granulocytes: 0.02 10*3/uL (ref 0.00–0.07)
Basophils Absolute: 0 10*3/uL (ref 0.0–0.1)
Basophils Relative: 0 %
Eosinophils Absolute: 0.4 10*3/uL (ref 0.0–0.5)
Eosinophils Relative: 7 %
HCT: 43.7 % (ref 39.0–52.0)
Hemoglobin: 14.3 g/dL (ref 13.0–17.0)
Immature Granulocytes: 0 %
Lymphocytes Relative: 19 %
Lymphs Abs: 1.1 10*3/uL (ref 0.7–4.0)
MCH: 30.4 pg (ref 26.0–34.0)
MCHC: 32.7 g/dL (ref 30.0–36.0)
MCV: 93 fL (ref 80.0–100.0)
Monocytes Absolute: 0.5 10*3/uL (ref 0.1–1.0)
Monocytes Relative: 8 %
Neutro Abs: 3.7 10*3/uL (ref 1.7–7.7)
Neutrophils Relative %: 66 %
Platelets: 270 10*3/uL (ref 150–400)
RBC: 4.7 MIL/uL (ref 4.22–5.81)
RDW: 12.7 % (ref 11.5–15.5)
WBC: 5.7 10*3/uL (ref 4.0–10.5)
nRBC: 0 % (ref 0.0–0.2)

## 2021-03-24 MED ORDER — SODIUM CHLORIDE 0.9 % IV SOLN
Freq: Once | INTRAVENOUS | Status: AC
  Filled 2021-03-24: qty 250

## 2021-03-24 MED ORDER — HEPARIN SOD (PORK) LOCK FLUSH 100 UNIT/ML IV SOLN
500.0000 [IU] | Freq: Once | INTRAVENOUS | Status: AC | PRN
  Filled 2021-03-24: qty 5

## 2021-03-24 MED ORDER — HEPARIN SOD (PORK) LOCK FLUSH 100 UNIT/ML IV SOLN
500.0000 [IU] | Freq: Once | INTRAVENOUS | Status: DC
  Filled 2021-03-24: qty 5

## 2021-03-24 MED ORDER — SODIUM CHLORIDE 0.9% FLUSH
10.0000 mL | INTRAVENOUS | Status: DC | PRN
Start: 2021-03-24 — End: 2021-03-24
  Filled 2021-03-24: qty 10

## 2021-03-24 MED ORDER — SODIUM CHLORIDE 0.9% FLUSH
10.0000 mL | Freq: Once | INTRAVENOUS | Status: AC
  Administered 2021-03-24: 10 mL via INTRAVENOUS
  Filled 2021-03-24: qty 10

## 2021-03-24 MED ORDER — HEPARIN SOD (PORK) LOCK FLUSH 100 UNIT/ML IV SOLN
INTRAVENOUS | Status: AC
  Administered 2021-03-24: 500 [IU]
  Filled 2021-03-24: qty 5

## 2021-03-24 MED ORDER — SODIUM CHLORIDE 0.9 % IV SOLN
10.0000 mg/kg | Freq: Once | INTRAVENOUS | Status: AC
  Administered 2021-03-24: 860 mg via INTRAVENOUS
  Filled 2021-03-24: qty 10

## 2021-03-24 NOTE — Patient Instructions (Signed)
[facility]  Discharge Instructions: Thank you for choosing [facility] to provide your oncology and hematology care.  If you have a lab appointment with the [facility], please go directly to the [facility] and check in at the registration area.  Wear comfortable clothing and clothing appropriate for easy access to any Portacath or PICC line.   We strive to give you quality time with your provider. You may need to reschedule your appointment if you arrive late (15 or more minutes).  Arriving late affects you and other patients whose appointments are after yours.  Also, if you miss three or more appointments without notifying the office, you may be dismissed from the clinic at the provider's discretion.      For prescription refill requests, have your pharmacy contact our office and allow 72 hours for refills to be completed.    Today you received the following chemotherapy and/or immunotherapy agents -durvalumab      To help prevent nausea and vomiting after your treatment, we encourage you to take your nausea medication as directed.  BELOW ARE SYMPTOMS THAT SHOULD BE REPORTED IMMEDIATELY: *FEVER GREATER THAN 100.4 F (38 C) OR HIGHER *CHILLS OR SWEATING *NAUSEA AND VOMITING THAT IS NOT CONTROLLED WITH YOUR NAUSEA MEDICATION *UNUSUAL SHORTNESS OF BREATH *UNUSUAL BRUISING OR BLEEDING *URINARY PROBLEMS (pain or burning when urinating, or frequent urination) *BOWEL PROBLEMS (unusual diarrhea, constipation, pain near the anus) TENDERNESS IN MOUTH AND THROAT WITH OR WITHOUT PRESENCE OF ULCERS (sore throat, sores in mouth, or a toothache) UNUSUAL RASH, SWELLING OR PAIN  UNUSUAL VAGINAL DISCHARGE OR ITCHING   Items with * indicate a potential emergency and should be followed up as soon as possible or go to the Emergency Department if any problems should occur.  Please show the CHEMOTHERAPY ALERT CARD or IMMUNOTHERAPY ALERT CARD at check-in  to the Emergency Department and triage nurse.  Should you have questions after your visit or need to cancel or reschedule your appointment, please contact [facility]  [phone] and follow the prompts.  Office hours are 8:00 a.m. to 4:30 p.m. Monday - Friday. Please note that voicemails left after 4:00 p.m. may not be returned until the following business day.  We are closed weekends and major holidays. You have access to a nurse at all times for urgent questions. Please call the main number to the clinic [phone] and follow the prompts.  For any non-urgent questions, you may also contact your provider using MyChart. We now offer e-Visits for anyone 34 and older to request care online for non-urgent symptoms. For details visit [email or web address].   Also download the MyChart app! Go to the app store, search "MyChart", open the app, select [facility], and log in with your MyChart username and password.  Due to Covid, a mask is required upon entering the hospital/clinic. If you do not have a mask, one will be given to you upon arrival. For doctor visits, patients may have 1 support person aged 10 or older with them. For treatment visits, patients cannot have anyone with them due to current Covid guidelines and our immunocompromised population.  Durvalumab injection What is this medication? DURVALUMAB (dur VAL ue mab) is a monoclonal antibody. It is used to treat lungcancer. This medicine may be used for other purposes; ask your health care provider orpharmacist if you have questions. COMMON BRAND NAME(S): [email or web address] What should I tell my care team before I take this medication? They  need to know if you have any of these conditions: autoimmune diseases like Crohn's disease, ulcerative colitis, or lupus have had or planning to have an allogeneic stem cell transplant (uses someone else's stem cells) history of organ transplant history of radiation to the  chest nervous system problems like myasthenia gravis or Guillain-Barre syndrome an unusual or allergic reaction to durvalumab, other medicines, foods, dyes, or preservatives pregnant or trying to get pregnant breast-feeding How should I use this medication? This medicine is for infusion into a vein. It is given by a health careprofessional in a hospital or clinic setting. A special MedGuide will be given to you before each treatment. Be sure to readthis information carefully each time. Talk to your pediatrician regarding the use of this medicine in children.Special care may be needed. Overdosage: If you think you have taken too much of this medicine contact apoison control center or emergency room at once. NOTE: This medicine is only for you. Do not share this medicine with others. What if I miss a dose? It is important not to miss your dose. Call your doctor or health careprofessional if you are unable to keep an appointment. What may interact with this medication? Interactions have not been studied. This list may not describe all possible interactions. Give your health care provider a list of all the medicines, herbs, non-prescription drugs, or dietary supplements you use. Also tell them if you smoke, drink alcohol, or use illegaldrugs. Some items may interact with your medicine. What should I watch for while using this medication? This drug may make you feel generally unwell. Continue your course of treatmenteven though you feel ill unless your doctor tells you to stop. You may need blood work done while you are taking this medicine. Do not become pregnant while taking this medicine or for 3 months after stopping it. Women should inform their doctor if they wish to become pregnant or think they might be pregnant. There is a potential for serious side effects to an unborn child. Talk to your health care professional or pharmacist for more information. Do not breast-feed an infant while taking  this medicine orfor 3 months after stopping it. What side effects may I notice from receiving this medication? Side effects that you should report to your doctor or health care professionalas soon as possible: allergic reactions like skin rash, itching or hives, swelling of the face, lips, or tongue black, tarry stools bloody or watery diarrhea breathing problems change in emotions or moods change in sex drive changes in vision chest pain or chest tightness chills confusion cough facial flushing fever headache signs and symptoms of high blood sugar such as dizziness; dry mouth; dry skin; fruity breath; nausea; stomach pain; increased hunger or thirst; increased urination signs and symptoms of liver injury like dark yellow or brown urine; general ill feeling or flu-like symptoms; light-colored stools; loss of appetite; nausea; right upper belly pain; unusually weak or tired; yellowing of the eyes or skin stomach pain trouble passing urine or change in the amount of urine weight gain or weight loss Side effects that usually do not require medical attention (report these toyour doctor or health care professional if they continue or are bothersome): bone pain constipation loss of appetite muscle pain nausea swelling of the ankles, feet, hands tiredness This list may not describe all possible side effects. Call your doctor for medical advice about side effects. You may report side effects to FDA at1-800-FDA-1088. Where should I keep my medication? This drug is  given in a hospital or clinic and will not be stored at home. NOTE: This sheet is a summary. It may not cover all possible information. If you have questions about this medicine, talk to your doctor, pharmacist, orhealth care provider.  2022 Elsevier/Gold Standard (2019-09-20 13:01:29)

## 2021-03-24 NOTE — Progress Notes (Signed)
Patient here for oncology follow-up appointment, concerns of occasional hip pain

## 2021-03-24 NOTE — Progress Notes (Signed)
Cr 1.8 ok to proceed per MD

## 2021-03-29 ENCOUNTER — Encounter: Payer: Self-pay | Admitting: Oncology

## 2021-03-29 NOTE — Progress Notes (Signed)
Hematology/Oncology Consult note [facility]  Telephone:([phone] Fax:([phone]  Patient Care Team: Perrin Maltese, MD as PCP - General (Internal Medicine) Telford Nab, RN as Oncology Nurse Navigator Sindy Guadeloupe, MD as Consulting Physician (Hematology and Oncology)   Name of the patient: Martin Jennings  [id number]  [birth year]   Date of visit: 03/29/21  Diagnosis-  stage IIIc adenocarcinoma of the lung cT3 cN3 M0 Chief complaint/ Reason for visit-on treatment assessment prior to cycle 16 of maintenance durvalumab  Heme/Onc history: Patient is a 70 year old male with a history of tobacco dependence who was admitted to the hospital for symptoms of acute onset shortness of breath and was found to have Covid pneumonia.  He underwent CT angio chest which showed extensive mediastinal as well as bilateral hilar adenopathy.  Masslike opacity with architectural distortion involving the right upper lobe measuring 5.7 x 2.9 x 2.3 cm and groundglass opacities bilaterally.  Mass involving the left adrenal gland as well as a smaller right adrenal gland possibly indicating adrenal metastases.     This was followed by a PET CT scan 2 weeks later which showed the consolidative process in the right upper lobe suv 7.88 concerning for infiltrating tumor.  It appears to involve the pleura associated interstitial thickening was also seen.  Bulky right hilar and mediastinal adenopathy.  Enlarged somewhat necrotic appearing left supraclavicular lymph node was also hypermetabolic.  Adrenal gland was not hypermetabolic and was consistent with a benign adenoma.  No metastatic disease involving liver.  No features of distant metastatic disease.   Pathology showed metastatic non-small cell lung cancer which was TTF-1 positive consistent with adenocarcinoma   Patient finished concurrent chemoradiation with weekly CarboTaxol.  Scan showed partial response and patient is currently on  maintenance durvalumab which was started on 07/29/2020  Interval history-patient reports doing well overall and denies any specific complaints at this time.  Denies any cough shortness of breath fever nausea vomiting or diarrhea  ECOG PS- 1 Pain scale- 0  Review of systems- Review of Systems  Constitutional:  Negative for chills, fever, malaise/fatigue and weight loss.  HENT:  Negative for congestion, ear discharge and nosebleeds.   Eyes:  Negative for blurred vision.  Respiratory:  Negative for cough, hemoptysis, sputum production, shortness of breath and wheezing.   Cardiovascular:  Negative for chest pain, palpitations, orthopnea and claudication.  Gastrointestinal:  Negative for abdominal pain, blood in stool, constipation, diarrhea, heartburn, melena, nausea and vomiting.  Genitourinary:  Negative for dysuria, flank pain, frequency, hematuria and urgency.  Musculoskeletal:  Negative for back pain, joint pain and myalgias.  Skin:  Negative for rash.  Neurological:  Negative for dizziness, tingling, focal weakness, seizures, weakness and headaches.  Endo/Heme/Allergies:  Does not bruise/bleed easily.  Psychiatric/Behavioral:  Negative for depression and suicidal ideas. The patient does not have insomnia.      No Known Allergies   Past Medical History:  Diagnosis Date   Bladder cancer ([place]) 2012   COVID-19 virus infection 04/03/2020   Hypertension    Lung cancer ([place])    Peripheral vascular disease ([place])      Past Surgical History:  Procedure Laterality Date   BLADDER REMOVAL     LOWER EXTREMITY ANGIOGRAPHY Left 04/12/2017   Procedure: Lower Extremity Angiography;  Surgeon: Katha Cabal, MD;  Location: [facility];  Service: Cardiovascular;  Laterality: Left;   PORTA CATH INSERTION N/A 05/22/2020   Procedure: PORTA CATH INSERTION;  Surgeon: Lucky Cowboy,  Erskine Squibb, MD;  Location: [facility];  Service: Cardiovascular;  Laterality: N/A;   uretostomy     VASCULAR  SURGERY      Social History   Socioeconomic History   Marital status: Married    Spouse name: Not on file   Number of children: Not on file   Years of education: Not on file   Highest education level: Not on file  Occupational History   Not on file  Tobacco Use   Smoking status: Former    Packs/day: 1.25    Years: 50.00    Pack years: 62.50    Types: Cigarettes    Quit date: 03/13/2020    Years since quitting: 1.0   Smokeless tobacco: Never  Vaping Use   Vaping Use: Never used  Substance and Sexual Activity   Alcohol use: No   Drug use: Not Currently    Types: Marijuana   Sexual activity: Not Currently  Other Topics Concern   Not on file  Social History Narrative   Not on file   Social Determinants of Health   Financial Resource Strain: Not on file  Food Insecurity: Not on file  Transportation Needs: Not on file  Physical Activity: Not on file  Stress: Not on file  Social Connections: Not on file  Intimate Partner Violence: Not on file    History reviewed. No pertinent family history.   Current Outpatient Medications:    amLODipine (NORVASC) 10 MG tablet, Take 10 mg by mouth daily. , Disp: , Rfl:    atorvastatin (LIPITOR) 20 MG tablet, Take 20 mg by mouth every evening. , Disp: , Rfl:    clopidogrel (PLAVIX) 75 MG tablet, Take 75 mg by mouth daily. , Disp: , Rfl:    lidocaine-prilocaine (EMLA) cream, Apply 1 application topically as needed., Disp: 30 g, Rfl: 2   oxyCODONE (OXY IR/ROXICODONE) 5 MG immediate release tablet, Take 1 tablet (5 mg total) by mouth every 6 (six) hours as needed for severe pain., Disp: 45 tablet, Rfl: 0 No current facility-administered medications for this visit.  Facility-Administered Medications Ordered in Other Visits:    sodium chloride flush (NS) 0.9 % injection 10 mL, 10 mL, Intravenous, PRN, Sindy Guadeloupe, MD, 10 mL at 09/18/20 0850  Physical exam:  Vitals:   03/24/21 1330  BP: 130/76  Pulse: 77  Resp: 17  Temp: 97.6 F  (36.4 C)  TempSrc: Tympanic  SpO2: 95%  Weight: 186 lb (84.4 kg)   Physical Exam Constitutional:      General: He is not in acute distress. Cardiovascular:     Rate and Rhythm: Normal rate and regular rhythm.     Heart sounds: Normal heart sounds.  Pulmonary:     Effort: Pulmonary effort is normal.     Breath sounds: Normal breath sounds.  Abdominal:     General: Bowel sounds are normal.     Palpations: Abdomen is soft.  Skin:    General: Skin is warm and dry.  Neurological:     Mental Status: He is alert and oriented to person, place, and time.     CMP Latest Ref Rng & Units 03/24/2021  Glucose 70 - 99 mg/dL 116(H)  BUN 8 - 23 mg/dL 26(H)  Creatinine 0.61 - 1.24 mg/dL 1.80(H)  Sodium 135 - 145 mmol/L 137  Potassium 3.5 - 5.1 mmol/L 3.8  Chloride 98 - 111 mmol/L 105  CO2 22 - 32 mmol/L 25  Calcium 8.9 - 10.3 mg/dL 8.8(L)  Total Protein 6.5 - 8.1 g/dL 7.4  Total Bilirubin 0.3 - 1.2 mg/dL 0.5  Alkaline Phos 38 - 126 U/L 57  AST 15 - 41 U/L 13(L)  ALT 0 - 44 U/L 10   CBC Latest Ref Rng & Units 03/24/2021  WBC 4.0 - 10.5 K/uL 5.7  Hemoglobin 13.0 - 17.0 g/dL 14.3  Hematocrit 39.0 - 52.0 % 43.7  Platelets 150 - 400 K/uL 270     Assessment and plan- Patient is a 70 y.o. male with stage IIIc adenocarcinoma of the right upper lobe of the lung cT4 N3 M0.  He is here for on treatment assessment prior to cycle 17 of maintenance durvalumab  Counts okay to proceed with cycle 17 of maintenance durvalumab today and I will see him back in 2 weeks for cycle 18.  I will likely switch him to the monthly dose at that time.  Repeat scans next month.    Visit Diagnosis 1. Malignant neoplasm of hilus of right lung ([place])   2. Encounter for antineoplastic immunotherapy      Dr. Randa Evens, MD, MPH [facility] [phone] 03/29/2021 10:03 AM

## 2021-04-01 ENCOUNTER — Encounter: Payer: Self-pay | Admitting: *Deleted

## 2021-04-07 ENCOUNTER — Encounter: Payer: Self-pay | Admitting: Oncology

## 2021-04-07 ENCOUNTER — Inpatient Hospital Stay: Payer: Medicare Other

## 2021-04-07 ENCOUNTER — Other Ambulatory Visit: Payer: Self-pay

## 2021-04-07 ENCOUNTER — Inpatient Hospital Stay: Payer: Medicare Other | Attending: Oncology

## 2021-04-07 ENCOUNTER — Inpatient Hospital Stay (HOSPITAL_BASED_OUTPATIENT_CLINIC_OR_DEPARTMENT_OTHER): Payer: Medicare Other | Admitting: Oncology

## 2021-04-07 VITALS — BP 136/77 | HR 78 | Temp 98.0°F | Resp 18 | Wt 189.0 lb

## 2021-04-07 DIAGNOSIS — Z5112 Encounter for antineoplastic immunotherapy: Secondary | ICD-10-CM | POA: Diagnosis not present

## 2021-04-07 DIAGNOSIS — Z87891 Personal history of nicotine dependence: Secondary | ICD-10-CM | POA: Insufficient documentation

## 2021-04-07 DIAGNOSIS — Z8551 Personal history of malignant neoplasm of bladder: Secondary | ICD-10-CM | POA: Diagnosis not present

## 2021-04-07 DIAGNOSIS — C3411 Malignant neoplasm of upper lobe, right bronchus or lung: Secondary | ICD-10-CM

## 2021-04-07 DIAGNOSIS — I129 Hypertensive chronic kidney disease with stage 1 through stage 4 chronic kidney disease, or unspecified chronic kidney disease: Secondary | ICD-10-CM | POA: Insufficient documentation

## 2021-04-07 DIAGNOSIS — N1832 Chronic kidney disease, stage 3b: Secondary | ICD-10-CM | POA: Insufficient documentation

## 2021-04-07 DIAGNOSIS — Z79899 Other long term (current) drug therapy: Secondary | ICD-10-CM | POA: Insufficient documentation

## 2021-04-07 DIAGNOSIS — C3401 Malignant neoplasm of right main bronchus: Secondary | ICD-10-CM

## 2021-04-07 LAB — CBC WITH DIFFERENTIAL/PLATELET
Abs Immature Granulocytes: 0.03 10*3/uL (ref 0.00–0.07)
Basophils Absolute: 0 10*3/uL (ref 0.0–0.1)
Basophils Relative: 0 %
Eosinophils Absolute: 0.3 10*3/uL (ref 0.0–0.5)
Eosinophils Relative: 5 %
HCT: 41.9 % (ref 39.0–52.0)
Hemoglobin: 13.6 g/dL (ref 13.0–17.0)
Immature Granulocytes: 0 %
Lymphocytes Relative: 16 %
Lymphs Abs: 1.1 10*3/uL (ref 0.7–4.0)
MCH: 30.6 pg (ref 26.0–34.0)
MCHC: 32.5 g/dL (ref 30.0–36.0)
MCV: 94.2 fL (ref 80.0–100.0)
Monocytes Absolute: 0.6 10*3/uL (ref 0.1–1.0)
Monocytes Relative: 8 %
Neutro Abs: 5.2 10*3/uL (ref 1.7–7.7)
Neutrophils Relative %: 71 %
Platelets: 212 10*3/uL (ref 150–400)
RBC: 4.45 MIL/uL (ref 4.22–5.81)
RDW: 12.9 % (ref 11.5–15.5)
WBC: 7.2 10*3/uL (ref 4.0–10.5)
nRBC: 0 % (ref 0.0–0.2)

## 2021-04-07 LAB — COMPREHENSIVE METABOLIC PANEL
ALT: 9 U/L (ref 0–44)
AST: 12 U/L — ABNORMAL LOW (ref 15–41)
Albumin: 3.6 g/dL (ref 3.5–5.0)
Alkaline Phosphatase: 71 U/L (ref 38–126)
Anion gap: 7 (ref 5–15)
BUN: 30 mg/dL — ABNORMAL HIGH (ref 8–23)
CO2: 25 mmol/L (ref 22–32)
Calcium: 9 mg/dL (ref 8.9–10.3)
Chloride: 106 mmol/L (ref 98–111)
Creatinine, Ser: 1.68 mg/dL — ABNORMAL HIGH (ref 0.61–1.24)
GFR, Estimated: 43 mL/min — ABNORMAL LOW (ref >60)
Glucose, Bld: 109 mg/dL — ABNORMAL HIGH (ref 70–99)
Potassium: 4 mmol/L (ref 3.5–5.1)
Sodium: 138 mmol/L (ref 135–145)
Total Bilirubin: 0.5 mg/dL (ref 0.3–1.2)
Total Protein: 7.1 g/dL (ref 6.5–8.1)

## 2021-04-07 LAB — TSH: TSH: 2.574 u[IU]/mL (ref 0.350–4.500)

## 2021-04-07 MED ORDER — SODIUM CHLORIDE 0.9 % IV SOLN
Freq: Once | INTRAVENOUS | Status: AC
  Filled 2021-04-07: qty 250

## 2021-04-07 MED ORDER — SODIUM CHLORIDE 0.9 % IV SOLN
10.0000 mg/kg | Freq: Once | INTRAVENOUS | Status: AC
  Administered 2021-04-07: 860 mg via INTRAVENOUS
  Filled 2021-04-07: qty 7.2

## 2021-04-07 MED ORDER — HEPARIN SOD (PORK) LOCK FLUSH 100 UNIT/ML IV SOLN
500.0000 [IU] | Freq: Once | INTRAVENOUS | Status: AC | PRN
  Filled 2021-04-07: qty 5

## 2021-04-07 MED ORDER — HEPARIN SOD (PORK) LOCK FLUSH 100 UNIT/ML IV SOLN
INTRAVENOUS | Status: AC
  Administered 2021-04-07: 500 [IU]
  Filled 2021-04-07: qty 5

## 2021-04-07 NOTE — Progress Notes (Signed)
1348- Creatinine: 1.68. MD, Dr. Janese Banks, notified and aware. Per MD order: proceed with scheduled Imfinzi treatment today.

## 2021-04-07 NOTE — Patient Instructions (Signed)
[facility] ONCOL[facility]   Discharge Instructions: Thank you for choosing [facility] to provide your oncology and hematology care.  If you have a lab appointment with the [facility], please go directly to the [facility] and check in at the registration area.  Wear comfortable clothing and clothing appropriate for easy access to any Portacath or PICC line.   We strive to give you quality time with your provider. You may need to reschedule your appointment if you arrive late (15 or more minutes).  Arriving late affects you and other patients whose appointments are after yours.  Also, if you miss three or more appointments without notifying the office, you may be dismissed from the clinic at the provider's discretion.      For prescription refill requests, have your pharmacy contact our office and allow 72 hours for refills to be completed.    Today you received the following chemotherapy and/or immunotherapy agents: Imfinzi.      To help prevent nausea and vomiting after your treatment, we encourage you to take your nausea medication as directed.  BELOW ARE SYMPTOMS THAT SHOULD BE REPORTED IMMEDIATELY: *FEVER GREATER THAN 100.4 F (38 C) OR HIGHER *CHILLS OR SWEATING *NAUSEA AND VOMITING THAT IS NOT CONTROLLED WITH YOUR NAUSEA MEDICATION *UNUSUAL SHORTNESS OF BREATH *UNUSUAL BRUISING OR BLEEDING *URINARY PROBLEMS (pain or burning when urinating, or frequent urination) *BOWEL PROBLEMS (unusual diarrhea, constipation, pain near the anus) TENDERNESS IN MOUTH AND THROAT WITH OR WITHOUT PRESENCE OF ULCERS (sore throat, sores in mouth, or a toothache) UNUSUAL RASH, SWELLING OR PAIN  UNUSUAL VAGINAL DISCHARGE OR ITCHING   Items with * indicate a potential emergency and should be followed up as soon as possible or go to the Emergency Department if any problems should occur.  Please show the CHEMOTHERAPY ALERT CARD or IMMUNOTHERAPY ALERT CARD at check-in  to the Emergency Department and triage nurse.  Should you have questions after your visit or need to cancel or reschedule your appointment, please contact [facility]  [phone] and follow the prompts.  Office hours are 8:00 a.m. to 4:30 p.m. Monday - Friday. Please note that voicemails left after 4:00 p.m. may not be returned until the following business day.  We are closed weekends and major holidays. You have access to a nurse at all times for urgent questions. Please call the main number to the clinic [phone] and follow the prompts.  For any non-urgent questions, you may also contact your provider using MyChart. We now offer e-Visits for anyone 72 and older to request care online for non-urgent symptoms. For details visit [email or web address].   Also download the MyChart app! Go to the app store, search "MyChart", open the app, select [facility], and log in with your MyChart username and password.  Due to Covid, a mask is required upon entering the hospital/clinic. If you do not have a mask, one will be given to you upon arrival. For doctor visits, patients may have 1 support person aged 70 or older with them. For treatment visits, patients cannot have anyone with them due to current Covid guidelines and our immunocompromised population.

## 2021-04-09 ENCOUNTER — Encounter: Payer: Self-pay | Admitting: Oncology

## 2021-04-09 NOTE — Progress Notes (Signed)
Reviewed patient based upon referral from prior medication assistance.  Will mail patient Martin Jennings agreement for one-time $1000 [place] and qualifications to assist with personal expenses while undergoing treatment.

## 2021-04-19 ENCOUNTER — Encounter: Payer: Self-pay | Admitting: Oncology

## 2021-04-19 NOTE — Progress Notes (Signed)
Hematology/Oncology Consult note [facility]  Telephone:([phone] Fax:([phone]  Patient Care Team: Perrin Maltese, MD as PCP - General (Internal Medicine) Telford Nab, RN as Oncology Nurse Navigator Sindy Guadeloupe, MD as Consulting Physician (Hematology and Oncology)   Name of the patient: Martin Jennings  [id number]  03/19/1951   Date of visit: 04/19/21  Diagnosis- stage IIIc adenocarcinoma of the lung cT3 cN3 M0  Chief complaint/ Reason for visit-on treatment assessment prior to cycle 85 of maintenance durvalumab  Heme/Onc history: Patient is a 70 year old male with a history of tobacco dependence who was admitted to the hospital for symptoms of acute onset shortness of breath and was found to have Covid pneumonia.  He underwent CT angio chest which showed extensive mediastinal as well as bilateral hilar adenopathy.  Masslike opacity with architectural distortion involving the right upper lobe measuring 5.7 x 2.9 x 2.3 cm and groundglass opacities bilaterally.  Mass involving the left adrenal gland as well as a smaller right adrenal gland possibly indicating adrenal metastases.     This was followed by a PET CT scan 2 weeks later which showed the consolidative process in the right upper lobe suv 7.88 concerning for infiltrating tumor.  It appears to involve the pleura associated interstitial thickening was also seen.  Bulky right hilar and mediastinal adenopathy.  Enlarged somewhat necrotic appearing left supraclavicular lymph node was also hypermetabolic.  Adrenal gland was not hypermetabolic and was consistent with a benign adenoma.  No metastatic disease involving liver.  No features of distant metastatic disease.   Pathology showed metastatic non-small cell lung cancer which was TTF-1 positive consistent with adenocarcinoma   Patient finished concurrent chemoradiation with weekly CarboTaxol.  Scan showed partial response and patient is currently on  maintenance durvalumab which was started on 07/29/2020  Interval history-patient reports doing well overall.  Denies any specific complaints at this time.  Denies any fever cough or exertional shortness of breath.  Left hip pain is stable  ECOG PS- 1 Pain scale- 0   Review of systems- Review of Systems  Constitutional:  Positive for malaise/fatigue. Negative for chills, fever and weight loss.  HENT:  Negative for congestion, ear discharge and nosebleeds.   Eyes:  Negative for blurred vision.  Respiratory:  Negative for cough, hemoptysis, sputum production, shortness of breath and wheezing.   Cardiovascular:  Negative for chest pain, palpitations, orthopnea and claudication.  Gastrointestinal:  Negative for abdominal pain, blood in stool, constipation, diarrhea, heartburn, melena, nausea and vomiting.  Genitourinary:  Negative for dysuria, flank pain, frequency, hematuria and urgency.  Musculoskeletal:  Negative for back pain, joint pain and myalgias.  Skin:  Negative for rash.  Neurological:  Negative for dizziness, tingling, focal weakness, seizures, weakness and headaches.  Endo/Heme/Allergies:  Does not bruise/bleed easily.  Psychiatric/Behavioral:  Negative for depression and suicidal ideas. The patient does not have insomnia.      No Known Allergies   Past Medical History:  Diagnosis Date   Bladder cancer ([place]) 2012   COVID-19 virus infection 04/03/2020   Hypertension    Lung cancer ([place])    Peripheral vascular disease ([place])      Past Surgical History:  Procedure Laterality Date   BLADDER REMOVAL     LOWER EXTREMITY ANGIOGRAPHY Left 04/12/2017   Procedure: Lower Extremity Angiography;  Surgeon: Katha Cabal, MD;  Location: [facility];  Service: Cardiovascular;  Laterality: Left;   PORTA CATH INSERTION N/A 05/22/2020  Procedure: PORTA CATH INSERTION;  Surgeon: Algernon Huxley, MD;  Location: [facility];  Service: Cardiovascular;  Laterality: N/A;    uretostomy     VASCULAR SURGERY      Social History   Socioeconomic History   Marital status: Married    Spouse name: Not on file   Number of children: Not on file   Years of education: Not on file   Highest education level: Not on file  Occupational History   Not on file  Tobacco Use   Smoking status: Former    Packs/day: 1.25    Years: 50.00    Pack years: 62.50    Types: Cigarettes    Quit date: 03/13/2020    Years since quitting: 1.1   Smokeless tobacco: Never  Vaping Use   Vaping Use: Never used  Substance and Sexual Activity   Alcohol use: No   Drug use: Not Currently    Types: Marijuana   Sexual activity: Not Currently  Other Topics Concern   Not on file  Social History Narrative   Not on file   Social Determinants of Health   Financial Resource Strain: Not on file  Food Insecurity: Not on file  Transportation Needs: Not on file  Physical Activity: Not on file  Stress: Not on file  Social Connections: Not on file  Intimate Partner Violence: Not on file    History reviewed. No pertinent family history.   Current Outpatient Medications:    amLODipine (NORVASC) 10 MG tablet, Take 10 mg by mouth daily. , Disp: , Rfl:    atorvastatin (LIPITOR) 20 MG tablet, Take 20 mg by mouth every evening. , Disp: , Rfl:    clopidogrel (PLAVIX) 75 MG tablet, Take 75 mg by mouth daily. , Disp: , Rfl:    lidocaine-prilocaine (EMLA) cream, Apply 1 application topically as needed., Disp: 30 g, Rfl: 2   oxyCODONE (OXY IR/ROXICODONE) 5 MG immediate release tablet, Take 1 tablet (5 mg total) by mouth every 6 (six) hours as needed for severe pain., Disp: 45 tablet, Rfl: 0 No current facility-administered medications for this visit.  Facility-Administered Medications Ordered in Other Visits:    sodium chloride flush (NS) 0.9 % injection 10 mL, 10 mL, Intravenous, PRN, Sindy Guadeloupe, MD, 10 mL at 09/18/20 0850  Physical exam:  Vitals:   04/07/21 1313  BP: 136/77  Pulse: 78   Resp: 18  Temp: 98 F (36.7 C)  SpO2: 95%  Weight: 189 lb (85.7 kg)   Physical Exam Cardiovascular:     Rate and Rhythm: Normal rate and regular rhythm.     Heart sounds: Normal heart sounds.  Pulmonary:     Effort: Pulmonary effort is normal.     Breath sounds: Normal breath sounds.  Abdominal:     General: Bowel sounds are normal.     Palpations: Abdomen is soft.  Skin:    General: Skin is warm and dry.  Neurological:     Mental Status: He is alert and oriented to person, place, and time.     CMP Latest Ref Rng & Units 04/07/2021  Glucose 70 - 99 mg/dL 109(H)  BUN 8 - 23 mg/dL 30(H)  Creatinine 0.61 - 1.24 mg/dL 1.68(H)  Sodium 135 - 145 mmol/L 138  Potassium 3.5 - 5.1 mmol/L 4.0  Chloride 98 - 111 mmol/L 106  CO2 22 - 32 mmol/L 25  Calcium 8.9 - 10.3 mg/dL 9.0  Total Protein 6.5 - 8.1 g/dL 7.1  Total Bilirubin 0.3 - 1.2 mg/dL 0.5  Alkaline Phos 38 - 126 U/L 71  AST 15 - 41 U/L 12(L)  ALT 0 - 44 U/L 9   CBC Latest Ref Rng & Units 04/07/2021  WBC 4.0 - 10.5 K/uL 7.2  Hemoglobin 13.0 - 17.0 g/dL 13.6  Hematocrit 39.0 - 52.0 % 41.9  Platelets 150 - 400 K/uL 212   Assessment and plan- Patient is a 70 y.o. male with stage IIIc adenocarcinoma of the right upper lobe of the lung cT4 N3 M0.  He is here for on treatment assessment prior to cycle 18 of maintenance durvalumab  Counts okay to proceed with cycle 18 of maintenance durvalumab today which she is tolerating well without any significant side effects.  I will see him back in 2 weeks for cycle 19.He will need repeat CT scans in end of October 2022.  TSH is presently normal   Visit Diagnosis 1. Encounter for antineoplastic immunotherapy   2. Malignant neoplasm of upper lobe of right lung [facility])      Dr. Randa Evens, MD, MPH [facility] at [facility] [phone] 04/19/2021 9:07 PM

## 2021-04-21 ENCOUNTER — Inpatient Hospital Stay (HOSPITAL_BASED_OUTPATIENT_CLINIC_OR_DEPARTMENT_OTHER): Payer: Medicare Other | Admitting: Oncology

## 2021-04-21 ENCOUNTER — Inpatient Hospital Stay: Payer: Medicare Other

## 2021-04-21 VITALS — BP 143/75 | HR 75 | Temp 99.1°F | Resp 16 | Wt 189.0 lb

## 2021-04-21 DIAGNOSIS — Z79899 Other long term (current) drug therapy: Secondary | ICD-10-CM | POA: Diagnosis not present

## 2021-04-21 DIAGNOSIS — N1832 Chronic kidney disease, stage 3b: Secondary | ICD-10-CM | POA: Diagnosis not present

## 2021-04-21 DIAGNOSIS — I129 Hypertensive chronic kidney disease with stage 1 through stage 4 chronic kidney disease, or unspecified chronic kidney disease: Secondary | ICD-10-CM | POA: Diagnosis not present

## 2021-04-21 DIAGNOSIS — Z87891 Personal history of nicotine dependence: Secondary | ICD-10-CM | POA: Diagnosis not present

## 2021-04-21 DIAGNOSIS — C3411 Malignant neoplasm of upper lobe, right bronchus or lung: Secondary | ICD-10-CM | POA: Diagnosis not present

## 2021-04-21 DIAGNOSIS — Z8551 Personal history of malignant neoplasm of bladder: Secondary | ICD-10-CM | POA: Diagnosis not present

## 2021-04-21 DIAGNOSIS — C3401 Malignant neoplasm of right main bronchus: Secondary | ICD-10-CM

## 2021-04-21 DIAGNOSIS — Z5112 Encounter for antineoplastic immunotherapy: Secondary | ICD-10-CM | POA: Diagnosis not present

## 2021-04-21 LAB — CBC WITH DIFFERENTIAL/PLATELET
Abs Immature Granulocytes: 0.01 10*3/uL (ref 0.00–0.07)
Basophils Absolute: 0 10*3/uL (ref 0.0–0.1)
Basophils Relative: 0 %
Eosinophils Absolute: 0.4 10*3/uL (ref 0.0–0.5)
Eosinophils Relative: 6 %
HCT: 42.2 % (ref 39.0–52.0)
Hemoglobin: 13.8 g/dL (ref 13.0–17.0)
Immature Granulocytes: 0 %
Lymphocytes Relative: 15 %
Lymphs Abs: 0.9 10*3/uL (ref 0.7–4.0)
MCH: 30.8 pg (ref 26.0–34.0)
MCHC: 32.7 g/dL (ref 30.0–36.0)
MCV: 94.2 fL (ref 80.0–100.0)
Monocytes Absolute: 0.4 10*3/uL (ref 0.1–1.0)
Monocytes Relative: 7 %
Neutro Abs: 4.5 10*3/uL (ref 1.7–7.7)
Neutrophils Relative %: 72 %
Platelets: 190 10*3/uL (ref 150–400)
RBC: 4.48 MIL/uL (ref 4.22–5.81)
RDW: 13.2 % (ref 11.5–15.5)
WBC: 6.3 10*3/uL (ref 4.0–10.5)
nRBC: 0 % (ref 0.0–0.2)

## 2021-04-21 LAB — COMPREHENSIVE METABOLIC PANEL
ALT: 10 U/L (ref 0–44)
AST: 14 U/L — ABNORMAL LOW (ref 15–41)
Albumin: 3.6 g/dL (ref 3.5–5.0)
Alkaline Phosphatase: 70 U/L (ref 38–126)
Anion gap: 7 (ref 5–15)
BUN: 27 mg/dL — ABNORMAL HIGH (ref 8–23)
CO2: 24 mmol/L (ref 22–32)
Calcium: 8.6 mg/dL — ABNORMAL LOW (ref 8.9–10.3)
Chloride: 107 mmol/L (ref 98–111)
Creatinine, Ser: 1.94 mg/dL — ABNORMAL HIGH (ref 0.61–1.24)
GFR, Estimated: 37 mL/min — ABNORMAL LOW (ref >60)
Glucose, Bld: 171 mg/dL — ABNORMAL HIGH (ref 70–99)
Potassium: 3.7 mmol/L (ref 3.5–5.1)
Sodium: 138 mmol/L (ref 135–145)
Total Bilirubin: 0.5 mg/dL (ref 0.3–1.2)
Total Protein: 7 g/dL (ref 6.5–8.1)

## 2021-04-21 MED ORDER — SODIUM CHLORIDE 0.9 % IV SOLN
10.0000 mg/kg | Freq: Once | INTRAVENOUS | Status: AC
  Administered 2021-04-21: 860 mg via INTRAVENOUS
  Filled 2021-04-21: qty 17.2

## 2021-04-21 MED ORDER — HEPARIN SOD (PORK) LOCK FLUSH 100 UNIT/ML IV SOLN
500.0000 [IU] | Freq: Once | INTRAVENOUS | Status: AC | PRN
  Administered 2021-04-21: 500 [IU]
  Filled 2021-04-21: qty 5

## 2021-04-21 MED ORDER — HEPARIN SOD (PORK) LOCK FLUSH 100 UNIT/ML IV SOLN
INTRAVENOUS | Status: AC
  Filled 2021-04-21: qty 5

## 2021-04-21 MED ORDER — SODIUM CHLORIDE 0.9 % IV SOLN
Freq: Once | INTRAVENOUS | Status: AC
  Filled 2021-04-21: qty 250

## 2021-04-21 NOTE — Patient Instructions (Signed)
[facility]  Discharge Instructions: Thank you for choosing [facility] to provide your oncology and hematology care.  If you have a lab appointment with the [facility], please go directly to the [facility] and check in at the registration area.  Wear comfortable clothing and clothing appropriate for easy access to any Portacath or PICC line.   We strive to give you quality time with your provider. You may need to reschedule your appointment if you arrive late (15 or more minutes).  Arriving late affects you and other patients whose appointments are after yours.  Also, if you miss three or more appointments without notifying the office, you may be dismissed from the clinic at the provider's discretion.      For prescription refill requests, have your pharmacy contact our office and allow 72 hours for refills to be completed.    Today you received the following chemotherapy and/or immunotherapy agents: Durvalumab      To help prevent nausea and vomiting after your treatment, we encourage you to take your nausea medication as directed.  BELOW ARE SYMPTOMS THAT SHOULD BE REPORTED IMMEDIATELY: *FEVER GREATER THAN 100.4 F (38 C) OR HIGHER *CHILLS OR SWEATING *NAUSEA AND VOMITING THAT IS NOT CONTROLLED WITH YOUR NAUSEA MEDICATION *UNUSUAL SHORTNESS OF BREATH *UNUSUAL BRUISING OR BLEEDING *URINARY PROBLEMS (pain or burning when urinating, or frequent urination) *BOWEL PROBLEMS (unusual diarrhea, constipation, pain near the anus) TENDERNESS IN MOUTH AND THROAT WITH OR WITHOUT PRESENCE OF ULCERS (sore throat, sores in mouth, or a toothache) UNUSUAL RASH, SWELLING OR PAIN  UNUSUAL VAGINAL DISCHARGE OR ITCHING   Items with * indicate a potential emergency and should be followed up as soon as possible or go to the Emergency Department if any problems should occur.  Please show the CHEMOTHERAPY ALERT CARD or IMMUNOTHERAPY ALERT CARD at check-in  to the Emergency Department and triage nurse.  Should you have questions after your visit or need to cancel or reschedule your appointment, please contact [facility]  [phone] and follow the prompts.  Office hours are 8:00 a.m. to 4:30 p.m. Monday - Friday. Please note that voicemails left after 4:00 p.m. may not be returned until the following business day.  We are closed weekends and major holidays. You have access to a nurse at all times for urgent questions. Please call the main number to the clinic [phone] and follow the prompts.  For any non-urgent questions, you may also contact your provider using MyChart. We now offer e-Visits for anyone 68 and older to request care online for non-urgent symptoms. For details visit [email or web address].   Also download the MyChart app! Go to the app store, search "MyChart", open the app, select [facility], and log in with your MyChart username and password.  Due to Covid, a mask is required upon entering the hospital/clinic. If you do not have a mask, one will be given to you upon arrival. For doctor visits, patients may have 1 support person aged 49 or older with them. For treatment visits, patients cannot have anyone with them due to current Covid guidelines and our immunocompromised population. Durvalumab injection What is this medication? DURVALUMAB (dur VAL ue mab) is a monoclonal antibody. It is used to treat lung cancer. This medicine may be used for other purposes; ask your health care provider or pharmacist if you have questions. COMMON BRAND NAME(S): IMFINZI What should I tell my care team before I take this medication?  They need to know if you have any of these conditions: autoimmune diseases like Crohn's disease, ulcerative colitis, or lupus have had or planning to have an allogeneic stem cell transplant (uses someone else's stem cells) history of organ transplant history of radiation to the  chest nervous system problems like myasthenia gravis or Guillain-Barre syndrome an unusual or allergic reaction to durvalumab, other medicines, foods, dyes, or preservatives pregnant or trying to get pregnant breast-feeding How should I use this medication? This medicine is for infusion into a vein. It is given by a health care professional in a hospital or clinic setting. A special MedGuide will be given to you before each treatment. Be sure to read this information carefully each time. Talk to your pediatrician regarding the use of this medicine in children. Special care may be needed. Overdosage: If you think you have taken too much of this medicine contact a poison control center or emergency room at once. NOTE: This medicine is only for you. Do not share this medicine with others. What if I miss a dose? It is important not to miss your dose. Call your doctor or health care professional if you are unable to keep an appointment. What may interact with this medication? Interactions have not been studied. This list may not describe all possible interactions. Give your health care provider a list of all the medicines, herbs, non-prescription drugs, or dietary supplements you use. Also tell them if you smoke, drink alcohol, or use illegal drugs. Some items may interact with your medicine. What should I watch for while using this medication? This drug may make you feel generally unwell. Continue your course of treatment even though you feel ill unless your doctor tells you to stop. You may need blood work done while you are taking this medicine. Do not become pregnant while taking this medicine or for 3 months after stopping it. Women should inform their doctor if they wish to become pregnant or think they might be pregnant. There is a potential for serious side effects to an unborn child. Talk to your health care professional or pharmacist for more information. Do not breast-feed an infant while  taking this medicine or for 3 months after stopping it. What side effects may I notice from receiving this medication? Side effects that you should report to your doctor or health care professional as soon as possible: allergic reactions like skin rash, itching or hives, swelling of the face, lips, or tongue black, tarry stools bloody or watery diarrhea breathing problems change in emotions or moods change in sex drive changes in vision chest pain or chest tightness chills confusion cough facial flushing fever headache signs and symptoms of high blood sugar such as dizziness; dry mouth; dry skin; fruity breath; nausea; stomach pain; increased hunger or thirst; increased urination signs and symptoms of liver injury like dark yellow or brown urine; general ill feeling or flu-like symptoms; light-colored stools; loss of appetite; nausea; right upper belly pain; unusually weak or tired; yellowing of the eyes or skin stomach pain trouble passing urine or change in the amount of urine weight gain or weight loss Side effects that usually do not require medical attention (report these to your doctor or health care professional if they continue or are bothersome): bone pain constipation loss of appetite muscle pain nausea swelling of the ankles, feet, hands tiredness This list may not describe all possible side effects. Call your doctor for medical advice about side effects. You may report side effects to  FDA at 1-800-FDA-1088. Where should I keep my medication? This drug is given in a hospital or clinic and will not be stored at home. NOTE: This sheet is a summary. It may not cover all possible information. If you have questions about this medicine, talk to your doctor, pharmacist, or health care provider.  2022 Elsevier/Gold Standard (2019-09-20 13:01:29)

## 2021-04-21 NOTE — Progress Notes (Signed)
Follow-up lung cancer. Scheduled for durvalumab today. No new concerns at this time.

## 2021-04-21 NOTE — Progress Notes (Signed)
Hematology/Oncology Consult note [facility]  Telephone:([phone] [phone]  Patient Care Team: Perrin Maltese, MD as PCP - General (Internal Medicine) Telford Nab, RN as Oncology Nurse Navigator Sindy Guadeloupe, MD as Consulting Physician (Hematology and Oncology)   Name of the patient: Martin Jennings  [id number]  29-Jun-1951   Date of visit: 04/21/21  Diagnosis- stage IIIc adenocarcinoma of the lung cT3 cN3 M0  Chief complaint/ Reason for visit-on treatment assessment prior to cycle 23 of maintenance durvalumab  Heme/Onc history: Patient is a 70 year old male with a history of tobacco dependence who was admitted to the hospital for symptoms of acute onset shortness of breath and was found to have Covid pneumonia.  He underwent CT angio chest which showed extensive mediastinal as well as bilateral hilar adenopathy.  Masslike opacity with architectural distortion involving the right upper lobe measuring 5.7 x 2.9 x 2.3 cm and groundglass opacities bilaterally.  Mass involving the left adrenal gland as well as a smaller right adrenal gland possibly indicating adrenal metastases.   This was followed by a PET CT scan 2 weeks later which showed the consolidative process in the right upper lobe suv 7.88 concerning for infiltrating tumor.  It appears to involve the pleura associated interstitial thickening was also seen.  Bulky right hilar and mediastinal adenopathy.  Enlarged somewhat necrotic appearing left supraclavicular lymph node was also hypermetabolic.  Adrenal gland was not hypermetabolic and was consistent with a benign adenoma.  No metastatic disease involving liver.  No features of distant metastatic disease.   Pathology showed metastatic non-small cell lung cancer which was TTF-1 positive consistent with adenocarcinoma   Patient finished concurrent chemoradiation with weekly CarboTaxol.  Scan showed partial response and patient is currently on  maintenance durvalumab which was started on 07/29/2020  Interval history-patient reports doing well today.  His appetite is good.  Has an occasional cough but denies any recent fevers.   ECOG PS- 1 Pain scale- 0   Review of systems- Review of Systems  Constitutional: Negative.  Negative for chills, fever, malaise/fatigue and weight loss.  HENT:  Negative for congestion, ear pain and tinnitus.   Eyes: Negative.  Negative for blurred vision and double vision.  Respiratory:  Positive for cough. Negative for sputum production and shortness of breath.   Cardiovascular: Negative.  Negative for chest pain, palpitations and leg swelling.  Gastrointestinal: Negative.  Negative for abdominal pain, constipation, diarrhea, nausea and vomiting.  Genitourinary:  Negative for dysuria, frequency and urgency.  Musculoskeletal:  Negative for back pain and falls.  Skin: Negative.  Negative for rash.  Neurological: Negative.  Negative for weakness and headaches.  Endo/Heme/Allergies: Negative.  Does not bruise/bleed easily.  Psychiatric/Behavioral: Negative.  Negative for depression. The patient is not nervous/anxious and does not have insomnia.      No Known Allergies   Past Medical History:  Diagnosis Date   Bladder cancer ([facility]) 2012   COVID-19 virus infection 04/03/2020   Hypertension    Lung cancer ([facility])    Peripheral vascular disease ([facility])      Past Surgical History:  Procedure Laterality Date   BLADDER REMOVAL     LOWER EXTREMITY ANGIOGRAPHY Left 04/12/2017   Procedure: Lower Extremity Angiography;  Surgeon: Katha Cabal, MD;  Location: [facility];  Service: Cardiovascular;  Laterality: Left;   PORTA CATH INSERTION N/A 05/22/2020   Procedure: PORTA CATH INSERTION;  Surgeon: Algernon Huxley, MD;  Location: [facility]  LAB;  Service: Cardiovascular;  Laterality: N/A;   uretostomy     VASCULAR SURGERY      Social History   Socioeconomic History   Marital status: Married     Spouse name: Not on file   Number of children: Not on file   Years of education: Not on file   Highest education level: Not on file  Occupational History   Not on file  Tobacco Use   Smoking status: Former    Packs/day: 1.25    Years: 50.00    Pack years: 62.50    Types: Cigarettes    Quit date: 03/13/2020    Years since quitting: 1.1   Smokeless tobacco: Never  Vaping Use   Vaping Use: Never used  Substance and Sexual Activity   Alcohol use: No   Drug use: Not Currently    Types: Marijuana   Sexual activity: Not Currently  Other Topics Concern   Not on file  Social History Narrative   Not on file   Social Determinants of Health   Financial Resource Strain: Not on file  Food Insecurity: Not on file  Transportation Needs: Not on file  Physical Activity: Not on file  Stress: Not on file  Social Connections: Not on file  Intimate Partner Violence: Not on file    No family history on file.   Current Outpatient Medications:    amLODipine (NORVASC) 10 MG tablet, Take 10 mg by mouth daily. , Disp: , Rfl:    atorvastatin (LIPITOR) 20 MG tablet, Take 20 mg by mouth every evening. , Disp: , Rfl:    clopidogrel (PLAVIX) 75 MG tablet, Take 75 mg by mouth daily. , Disp: , Rfl:    lidocaine-prilocaine (EMLA) cream, Apply 1 application topically as needed., Disp: 30 g, Rfl: 2   oxyCODONE (OXY IR/ROXICODONE) 5 MG immediate release tablet, Take 1 tablet (5 mg total) by mouth every 6 (six) hours as needed for severe pain., Disp: 45 tablet, Rfl: 0 No current facility-administered medications for this visit.  Facility-Administered Medications Ordered in Other Visits:    durvalumab (IMFINZI) 860 mg in sodium chloride 0.9 % 100 mL chemo infusion, 10 mg/kg (Order-Specific), Intravenous, Once, Sindy Guadeloupe, MD   heparin lock flush 100 unit/mL, 500 Units, Intracatheter, Once PRN, Sindy Guadeloupe, MD   sodium chloride flush (NS) 0.9 % injection 10 mL, 10 mL, Intravenous, PRN, Sindy Guadeloupe, MD, 10 mL at 09/18/20 0850  Physical exam:  Vitals:   04/21/21 1322  BP: (!) 143/75  Pulse: 75  Resp: 16  Temp: 99.1 F (37.3 C)  TempSrc: Tympanic  SpO2: 96%  Weight: 189 lb (85.7 kg)   Physical Exam Constitutional:      Appearance: Normal appearance.  HENT:     Head: Normocephalic and atraumatic.  Eyes:     Pupils: Pupils are equal, round, and reactive to light.  Cardiovascular:     Rate and Rhythm: Normal rate and regular rhythm.     Heart sounds: Normal heart sounds. No murmur heard. Pulmonary:     Effort: Pulmonary effort is normal.     Breath sounds: Decreased breath sounds present. No wheezing.  Abdominal:     General: Bowel sounds are normal. There is no distension.     Palpations: Abdomen is soft.     Tenderness: There is no abdominal tenderness.  Musculoskeletal:        General: Normal range of motion.     Cervical back: Normal range of  motion.  Skin:    General: Skin is warm and dry.     Findings: No rash.  Neurological:     Mental Status: He is alert and oriented to person, place, and time.  Psychiatric:        Judgment: Judgment normal.     CMP Latest Ref Rng & Units 04/21/2021  Glucose 70 - 99 mg/dL 171(H)  BUN 8 - 23 mg/dL 27(H)  Creatinine 0.61 - 1.24 mg/dL 1.94(H)  Sodium 135 - 145 mmol/L 138  Potassium 3.5 - 5.1 mmol/L 3.7  Chloride 98 - 111 mmol/L 107  CO2 22 - 32 mmol/L 24  Calcium 8.9 - 10.3 mg/dL 8.6(L)  Total Protein 6.5 - 8.1 g/dL 7.0  Total Bilirubin 0.3 - 1.2 mg/dL 0.5  Alkaline Phos 38 - 126 U/L 70  AST 15 - 41 U/L 14(L)  ALT 0 - 44 U/L 10   CBC Latest Ref Rng & Units 04/21/2021  WBC 4.0 - 10.5 K/uL 6.3  Hemoglobin 13.0 - 17.0 g/dL 13.8  Hematocrit 39.0 - 52.0 % 42.2  Platelets 150 - 400 K/uL 190   Assessment and plan- Patient is a 70 y.o. male with stage IIIc adenocarcinoma of the right upper lobe of the lung cT4 N3 M0.  He is here for treatment assessment prior to cycle 19 of maintenance nivolumab.  Labs from  today show hemoglobin of 13.8 with a normal differential and a creatinine of 1.94.  Patient has CKD and baseline creatinine is between 1.5 and 2.01 over the past year.  Proceed with cycle 19 of maintenance nivolumab.  He is tolerating well without any significant side effects.  Plan is for him to return to clinic in 2 weeks for cycle 20 and see Dr. Janese Banks.  He will get repeat imaging at the end of October.  TSH is normal.  I spent 20 minutes dedicated to the care of this patient (face-to-face and non-face-to-face) on the date of the encounter to include what is described in the assessment and plan.  Visit Diagnosis 1. Malignant neoplasm of hilus of right lung (HCC)   2. Stage 3b chronic kidney disease ([facility])      Faythe Casa, NP 04/21/2021 2:37 PM

## 2021-04-21 NOTE — Progress Notes (Signed)
Creatinine 1.94 today. Per Dr Janese Banks okay to proceed with treatment

## 2021-05-04 ENCOUNTER — Other Ambulatory Visit: Payer: Self-pay | Admitting: *Deleted

## 2021-05-04 MED ORDER — OXYCODONE HCL 5 MG PO TABS: 5.0000 mg | ORAL_TABLET | Freq: Four times a day (QID) | ORAL | 0 refills | Status: DC | PRN

## 2021-05-05 ENCOUNTER — Inpatient Hospital Stay: Payer: Medicare Other | Attending: Oncology

## 2021-05-05 ENCOUNTER — Inpatient Hospital Stay (HOSPITAL_BASED_OUTPATIENT_CLINIC_OR_DEPARTMENT_OTHER): Payer: Medicare Other | Admitting: Oncology

## 2021-05-05 ENCOUNTER — Encounter: Payer: Self-pay | Admitting: Oncology

## 2021-05-05 ENCOUNTER — Other Ambulatory Visit: Payer: Self-pay

## 2021-05-05 ENCOUNTER — Inpatient Hospital Stay: Payer: Medicare Other

## 2021-05-05 VITALS — BP 131/81 | HR 82 | Temp 98.1°F | Resp 20 | Wt 188.2 lb

## 2021-05-05 DIAGNOSIS — I10 Essential (primary) hypertension: Secondary | ICD-10-CM | POA: Diagnosis not present

## 2021-05-05 DIAGNOSIS — C3411 Malignant neoplasm of upper lobe, right bronchus or lung: Secondary | ICD-10-CM | POA: Insufficient documentation

## 2021-05-05 DIAGNOSIS — Z8551 Personal history of malignant neoplasm of bladder: Secondary | ICD-10-CM | POA: Insufficient documentation

## 2021-05-05 DIAGNOSIS — Z5112 Encounter for antineoplastic immunotherapy: Secondary | ICD-10-CM | POA: Diagnosis not present

## 2021-05-05 DIAGNOSIS — Z87891 Personal history of nicotine dependence: Secondary | ICD-10-CM | POA: Diagnosis not present

## 2021-05-05 DIAGNOSIS — Z79899 Other long term (current) drug therapy: Secondary | ICD-10-CM | POA: Diagnosis not present

## 2021-05-05 DIAGNOSIS — C3401 Malignant neoplasm of right main bronchus: Secondary | ICD-10-CM

## 2021-05-05 LAB — COMPREHENSIVE METABOLIC PANEL
ALT: 9 U/L (ref 0–44)
AST: 13 U/L — ABNORMAL LOW (ref 15–41)
Albumin: 3.7 g/dL (ref 3.5–5.0)
Alkaline Phosphatase: 63 U/L (ref 38–126)
Anion gap: 7 (ref 5–15)
BUN: 27 mg/dL — ABNORMAL HIGH (ref 8–23)
CO2: 25 mmol/L (ref 22–32)
Calcium: 8.8 mg/dL — ABNORMAL LOW (ref 8.9–10.3)
Chloride: 105 mmol/L (ref 98–111)
Creatinine, Ser: 1.9 mg/dL — ABNORMAL HIGH (ref 0.61–1.24)
GFR, Estimated: 37 mL/min — ABNORMAL LOW (ref >60)
Glucose, Bld: 103 mg/dL — ABNORMAL HIGH (ref 70–99)
Potassium: 3.6 mmol/L (ref 3.5–5.1)
Sodium: 137 mmol/L (ref 135–145)
Total Bilirubin: 0.4 mg/dL (ref 0.3–1.2)
Total Protein: 7.4 g/dL (ref 6.5–8.1)

## 2021-05-05 LAB — CBC WITH DIFFERENTIAL/PLATELET
Abs Immature Granulocytes: 0.01 10*3/uL (ref 0.00–0.07)
Basophils Absolute: 0 10*3/uL (ref 0.0–0.1)
Basophils Relative: 0 %
Eosinophils Absolute: 0.4 10*3/uL (ref 0.0–0.5)
Eosinophils Relative: 6 %
HCT: 43.2 % (ref 39.0–52.0)
Hemoglobin: 14.2 g/dL (ref 13.0–17.0)
Immature Granulocytes: 0 %
Lymphocytes Relative: 14 %
Lymphs Abs: 1 10*3/uL (ref 0.7–4.0)
MCH: 30.8 pg (ref 26.0–34.0)
MCHC: 32.9 g/dL (ref 30.0–36.0)
MCV: 93.7 fL (ref 80.0–100.0)
Monocytes Absolute: 0.6 10*3/uL (ref 0.1–1.0)
Monocytes Relative: 8 %
Neutro Abs: 4.7 10*3/uL (ref 1.7–7.7)
Neutrophils Relative %: 72 %
Platelets: 212 10*3/uL (ref 150–400)
RBC: 4.61 MIL/uL (ref 4.22–5.81)
RDW: 12.9 % (ref 11.5–15.5)
WBC: 6.6 10*3/uL (ref 4.0–10.5)
nRBC: 0 % (ref 0.0–0.2)

## 2021-05-05 MED ORDER — SODIUM CHLORIDE 0.9 % IV SOLN
10.0000 mg/kg | Freq: Once | INTRAVENOUS | Status: AC
  Administered 2021-05-05: 860 mg via INTRAVENOUS
  Filled 2021-05-05: qty 10

## 2021-05-05 MED ORDER — HEPARIN SOD (PORK) LOCK FLUSH 100 UNIT/ML IV SOLN
500.0000 [IU] | Freq: Once | INTRAVENOUS | Status: AC | PRN
  Filled 2021-05-05: qty 5

## 2021-05-05 MED ORDER — AZITHROMYCIN 250 MG PO TABS: ORAL_TABLET | ORAL | 0 refills | Status: DC

## 2021-05-05 MED ORDER — SODIUM CHLORIDE 0.9 % IV SOLN
Freq: Once | INTRAVENOUS | Status: AC
  Filled 2021-05-05: qty 250

## 2021-05-05 MED ORDER — HEPARIN SOD (PORK) LOCK FLUSH 100 UNIT/ML IV SOLN
INTRAVENOUS | Status: AC
  Administered 2021-05-05: 500 [IU]
  Filled 2021-05-05: qty 5

## 2021-05-05 NOTE — Patient Instructions (Signed)
[facility]   Discharge Instructions: Thank you for choosing [facility] to provide your oncology and hematology care.  If you have a lab appointment with the [facility], please go directly to the [facility] and check in at the registration area.  Wear comfortable clothing and clothing appropriate for easy access to any Portacath or PICC line.   We strive to give you quality time with your provider. You may need to reschedule your appointment if you arrive late (15 or more minutes).  Arriving late affects you and other patients whose appointments are after yours.  Also, if you miss three or more appointments without notifying the office, you may be dismissed from the clinic at the provider's discretion.      For prescription refill requests, have your pharmacy contact our office and allow 72 hours for refills to be completed.    Today you received the following chemotherapy and/or immunotherapy agents: Imfinzi.      To help prevent nausea and vomiting after your treatment, we encourage you to take your nausea medication as directed.  BELOW ARE SYMPTOMS THAT SHOULD BE REPORTED IMMEDIATELY: *FEVER GREATER THAN 100.4 F (38 C) OR HIGHER *CHILLS OR SWEATING *NAUSEA AND VOMITING THAT IS NOT CONTROLLED WITH YOUR NAUSEA MEDICATION *UNUSUAL SHORTNESS OF BREATH *UNUSUAL BRUISING OR BLEEDING *URINARY PROBLEMS (pain or burning when urinating, or frequent urination) *BOWEL PROBLEMS (unusual diarrhea, constipation, pain near the anus) TENDERNESS IN MOUTH AND THROAT WITH OR WITHOUT PRESENCE OF ULCERS (sore throat, sores in mouth, or a toothache) UNUSUAL RASH, SWELLING OR PAIN  UNUSUAL VAGINAL DISCHARGE OR ITCHING   Items with * indicate a potential emergency and should be followed up as soon as possible or go to the Emergency Department if any problems should occur.  Please show the CHEMOTHERAPY ALERT CARD or IMMUNOTHERAPY ALERT CARD at check-in  to the Emergency Department and triage nurse.  Should you have questions after your visit or need to cancel or reschedule your appointment, please contact [facility]  [phone] and follow the prompts.  Office hours are 8:00 a.m. to 4:30 p.m. Monday - Friday. Please note that voicemails left after 4:00 p.m. may not be returned until the following business day.  We are closed weekends and major holidays. You have access to a nurse at all times for urgent questions. Please call the main number to the clinic [phone] and follow the prompts.  For any non-urgent questions, you may also contact your provider using MyChart. We now offer e-Visits for anyone 79 and older to request care online for non-urgent symptoms. For details visit [email or web address].   Also download the MyChart app! Go to the app store, search "MyChart", open the app, select [facility], and log in with your MyChart username and password.  Due to Covid, a mask is required upon entering the hospital/clinic. If you do not have a mask, one will be given to you upon arrival. For doctor visits, patients may have 1 support person aged 4 or older with them. For treatment visits, patients cannot have anyone with them due to current Covid guidelines and our immunocompromised population.

## 2021-05-05 NOTE — Progress Notes (Signed)
Creatinine: 1.9. MD, Dr. Janese Banks, notified and aware. Per MD order: proceed with scheduled Imfinzi treatment today.

## 2021-05-06 ENCOUNTER — Encounter: Payer: Self-pay | Admitting: Oncology

## 2021-05-06 NOTE — Progress Notes (Signed)
Hematology/Oncology Consult note [facility]  [phone] [phone]  Patient Care Team: Perrin Maltese, MD as PCP - General (Internal Medicine) Telford Nab, RN as Oncology Nurse Navigator Sindy Guadeloupe, MD as Consulting Physician (Hematology and Oncology)   Name of the patient: Martin Jennings  [id number]  09-18-1950   Date of visit: 05/06/21  Diagnosis- stage IIIc adenocarcinoma of the lung cT3 cN3 M0  Chief complaint/ Reason for visit-on treatment assessment prior to cycle 18 of maintenance durvalumab  Heme/Onc history: Patient is a 70 year old male with a history of tobacco dependence who was admitted to the hospital for symptoms of acute onset shortness of breath and was found to have Covid pneumonia.  He underwent CT angio chest which showed extensive mediastinal as well as bilateral hilar adenopathy.  Masslike opacity with architectural distortion involving the right upper lobe measuring 5.7 x 2.9 x 2.3 cm and groundglass opacities bilaterally.  Mass involving the left adrenal gland as well as a smaller right adrenal gland possibly indicating adrenal metastases.     This was followed by a PET CT scan 2 weeks later which showed the consolidative process in the right upper lobe suv 7.88 concerning for infiltrating tumor.  It appears to involve the pleura associated interstitial thickening was also seen.  Bulky right hilar and mediastinal adenopathy.  Enlarged somewhat necrotic appearing left supraclavicular lymph node was also hypermetabolic.  Adrenal gland was not hypermetabolic and was consistent with a benign adenoma.  No metastatic disease involving liver.  No features of distant metastatic disease.   Pathology showed metastatic non-small cell lung cancer which was TTF-1 positive consistent with adenocarcinoma   Patient finished concurrent chemoradiation with weekly CarboTaxol.  Scan showed partial response and patient is currently on  maintenance durvalumab which was started on 07/29/2020  Interval history-patient reports having some ongoing cough and yellowish sputum production for the last 1 week to 10 days.  Denies any fever.  He has tried over-the-counter medications which has not provided any relief.  ECOG PS- 1 Pain scale- 0   Review of systems- Review of Systems  Constitutional:  Negative for chills, fever, malaise/fatigue and weight loss.  HENT:  Negative for congestion, ear discharge and nosebleeds.   Eyes:  Negative for blurred vision.  Respiratory:  Positive for cough and sputum production. Negative for hemoptysis, shortness of breath and wheezing.   Cardiovascular:  Negative for chest pain, palpitations, orthopnea and claudication.  Gastrointestinal:  Negative for abdominal pain, blood in stool, constipation, diarrhea, heartburn, melena, nausea and vomiting.  Genitourinary:  Negative for dysuria, flank pain, frequency, hematuria and urgency.  Musculoskeletal:  Negative for back pain, joint pain and myalgias.  Skin:  Negative for rash.  Neurological:  Negative for dizziness, tingling, focal weakness, seizures, weakness and headaches.  Endo/Heme/Allergies:  Does not bruise/bleed easily.  Psychiatric/Behavioral:  Negative for depression and suicidal ideas. The patient does not have insomnia.      No Known Allergies   Past Medical History:  Diagnosis Date   Bladder cancer ([place]) 2012   COVID-19 virus infection 04/03/2020   Hypertension    Lung cancer ([place])    Peripheral vascular disease ([place])      Past Surgical History:  Procedure Laterality Date   BLADDER REMOVAL     LOWER EXTREMITY ANGIOGRAPHY Left 04/12/2017   Procedure: Lower Extremity Angiography;  Surgeon: Katha Cabal, MD;  Location: [place];  Service: Cardiovascular;  Laterality: Left;   PORTA  CATH INSERTION N/A 05/22/2020   Procedure: PORTA CATH INSERTION;  Surgeon: Algernon Huxley, MD;  Location: [place] [facility];  Service:  Cardiovascular;  Laterality: N/A;   uretostomy     VASCULAR SURGERY      Social History   Socioeconomic History   Marital status: Married    Spouse name: Not on file   Number of children: Not on file   Years of education: Not on file   Highest education level: Not on file  Occupational History   Not on file  Tobacco Use   Smoking status: Former    Packs/day: 1.25    Years: 50.00    Pack years: 62.50    Types: Cigarettes    Quit date: 03/13/2020    Years since quitting: 1.1   Smokeless tobacco: Never  Vaping Use   Vaping Use: Never used  Substance and Sexual Activity   Alcohol use: No   Drug use: Not Currently    Types: Marijuana   Sexual activity: Not Currently  Other Topics Concern   Not on file  Social History Narrative   Not on file   Social Determinants of Health   Financial Resource Strain: Not on file  Food Insecurity: Not on file  Transportation Needs: Not on file  Physical Activity: Not on file  Stress: Not on file  Social Connections: Not on file  Intimate Partner Violence: Not on file    History reviewed. No pertinent family history.   Current Outpatient Medications:    amLODipine (NORVASC) 10 MG tablet, Take 10 mg by mouth daily. , Disp: , Rfl:    atorvastatin (LIPITOR) 20 MG tablet, Take 20 mg by mouth every evening. , Disp: , Rfl:    azithromycin (ZITHROMAX Z-PAK) 250 MG tablet, Take 2 tablets on day 1 ,then 1 tablet daily until you complete the prescription, Disp: 6 each, Rfl: 0   clopidogrel (PLAVIX) 75 MG tablet, Take 75 mg by mouth daily. , Disp: , Rfl:    lidocaine-prilocaine (EMLA) cream, Apply 1 application topically as needed., Disp: 30 g, Rfl: 2   oxyCODONE (OXY IR/ROXICODONE) 5 MG immediate release tablet, Take 1 tablet (5 mg total) by mouth every 6 (six) hours as needed for severe pain., Disp: 45 tablet, Rfl: 0 No current facility-administered medications for this visit.  Facility-Administered Medications Ordered in Other Visits:     sodium chloride flush (NS) 0.9 % injection 10 mL, 10 mL, Intravenous, PRN, Sindy Guadeloupe, MD, 10 mL at 09/18/20 0850  Physical exam:  Vitals:   05/05/21 1310  BP: 131/81  Pulse: 82  Resp: 20  Temp: 98.1 F (36.7 C)  TempSrc: Tympanic  SpO2: 95%  Weight: 188 lb 3.2 oz (85.4 kg)   Physical Exam Constitutional:      General: He is not in acute distress. Cardiovascular:     Rate and Rhythm: Normal rate and regular rhythm.     Heart sounds: Normal heart sounds.  Pulmonary:     Effort: Pulmonary effort is normal.     Breath sounds: Normal breath sounds.  Abdominal:     General: Bowel sounds are normal.     Palpations: Abdomen is soft.  Skin:    General: Skin is warm and dry.  Neurological:     Mental Status: He is alert and oriented to person, place, and time.     CMP Latest Ref Rng & Units 05/05/2021  Glucose 70 - 99 mg/dL 103(H)  BUN 8 - 23 mg/dL  27(H)  Creatinine 0.61 - 1.24 mg/dL 1.90(H)  Sodium 135 - 145 mmol/L 137  Potassium 3.5 - 5.1 mmol/L 3.6  Chloride 98 - 111 mmol/L 105  CO2 22 - 32 mmol/L 25  Calcium 8.9 - 10.3 mg/dL 8.8(L)  Total Protein 6.5 - 8.1 g/dL 7.4  Total Bilirubin 0.3 - 1.2 mg/dL 0.4  Alkaline Phos 38 - 126 U/L 63  AST 15 - 41 U/L 13(L)  ALT 0 - 44 U/L 9   CBC Latest Ref Rng & Units 05/05/2021  WBC 4.0 - 10.5 K/uL 6.6  Hemoglobin 13.0 - 17.0 g/dL 14.2  Hematocrit 39.0 - 52.0 % 43.2  Platelets 150 - 400 K/uL 212     Assessment and plan- Patient is a 70 y.o. male with stage IIIc adenocarcinoma of the right upper lobe of the lung cT4 N3 M0.  He is here for on treatment assessment prior to cycle 19 of maintenance durvalumab  Counts okay to proceed with cycle 19 of maintenance durvalumab today which she is tolerating well without any significant side effects.  I will see him back in 2 weeks for cycle 20.He will need a repeat CT chest abdomen pelvis with contrast in about 2 to 3 weeks time.  URI: Symptoms have persisted for over a week despite  over-the-counter medications.  I will give him a course of Z-Pak today.   Visit Diagnosis 1. Malignant neoplasm of upper lobe of right lung ([place])   2. Encounter for antineoplastic immunotherapy      Dr. Randa Evens, MD, MPH [facility] at [facility] [phone] 05/06/2021 8:50 AM

## 2021-05-19 ENCOUNTER — Inpatient Hospital Stay: Payer: Medicare Other

## 2021-05-19 ENCOUNTER — Other Ambulatory Visit: Payer: Self-pay

## 2021-05-19 ENCOUNTER — Inpatient Hospital Stay (HOSPITAL_BASED_OUTPATIENT_CLINIC_OR_DEPARTMENT_OTHER): Payer: Medicare Other | Admitting: Oncology

## 2021-05-19 ENCOUNTER — Encounter: Payer: Self-pay | Admitting: Oncology

## 2021-05-19 VITALS — BP 143/81 | HR 72 | Temp 97.8°F | Resp 18 | Wt 191.8 lb

## 2021-05-19 DIAGNOSIS — I10 Essential (primary) hypertension: Secondary | ICD-10-CM | POA: Diagnosis not present

## 2021-05-19 DIAGNOSIS — Z5112 Encounter for antineoplastic immunotherapy: Secondary | ICD-10-CM | POA: Diagnosis not present

## 2021-05-19 DIAGNOSIS — C3411 Malignant neoplasm of upper lobe, right bronchus or lung: Secondary | ICD-10-CM | POA: Diagnosis not present

## 2021-05-19 DIAGNOSIS — Z87891 Personal history of nicotine dependence: Secondary | ICD-10-CM | POA: Diagnosis not present

## 2021-05-19 DIAGNOSIS — C3401 Malignant neoplasm of right main bronchus: Secondary | ICD-10-CM

## 2021-05-19 DIAGNOSIS — Z79899 Other long term (current) drug therapy: Secondary | ICD-10-CM | POA: Diagnosis not present

## 2021-05-19 DIAGNOSIS — Z8551 Personal history of malignant neoplasm of bladder: Secondary | ICD-10-CM | POA: Diagnosis not present

## 2021-05-19 LAB — CBC WITH DIFFERENTIAL/PLATELET
Abs Immature Granulocytes: 0.02 10*3/uL (ref 0.00–0.07)
Basophils Absolute: 0 10*3/uL (ref 0.0–0.1)
Basophils Relative: 0 %
Eosinophils Absolute: 0.4 10*3/uL (ref 0.0–0.5)
Eosinophils Relative: 6 %
HCT: 43.6 % (ref 39.0–52.0)
Hemoglobin: 14.4 g/dL (ref 13.0–17.0)
Immature Granulocytes: 0 %
Lymphocytes Relative: 17 %
Lymphs Abs: 1.2 10*3/uL (ref 0.7–4.0)
MCH: 30.7 pg (ref 26.0–34.0)
MCHC: 33 g/dL (ref 30.0–36.0)
MCV: 93 fL (ref 80.0–100.0)
Monocytes Absolute: 0.5 10*3/uL (ref 0.1–1.0)
Monocytes Relative: 7 %
Neutro Abs: 4.6 10*3/uL (ref 1.7–7.7)
Neutrophils Relative %: 70 %
Platelets: 203 10*3/uL (ref 150–400)
RBC: 4.69 MIL/uL (ref 4.22–5.81)
RDW: 12.9 % (ref 11.5–15.5)
WBC: 6.6 10*3/uL (ref 4.0–10.5)
nRBC: 0 % (ref 0.0–0.2)

## 2021-05-19 LAB — COMPREHENSIVE METABOLIC PANEL
ALT: 14 U/L (ref 0–44)
AST: 17 U/L (ref 15–41)
Albumin: 3.7 g/dL (ref 3.5–5.0)
Alkaline Phosphatase: 66 U/L (ref 38–126)
Anion gap: 4 — ABNORMAL LOW (ref 5–15)
BUN: 22 mg/dL (ref 8–23)
CO2: 26 mmol/L (ref 22–32)
Calcium: 8.9 mg/dL (ref 8.9–10.3)
Chloride: 106 mmol/L (ref 98–111)
Creatinine, Ser: 1.81 mg/dL — ABNORMAL HIGH (ref 0.61–1.24)
GFR, Estimated: 40 mL/min — ABNORMAL LOW (ref >60)
Glucose, Bld: 95 mg/dL (ref 70–99)
Potassium: 3.7 mmol/L (ref 3.5–5.1)
Sodium: 136 mmol/L (ref 135–145)
Total Bilirubin: 0.6 mg/dL (ref 0.3–1.2)
Total Protein: 7.2 g/dL (ref 6.5–8.1)

## 2021-05-19 MED ORDER — HEPARIN SOD (PORK) LOCK FLUSH 100 UNIT/ML IV SOLN
500.0000 [IU] | Freq: Once | INTRAVENOUS | Status: AC | PRN
  Administered 2021-05-19: 500 [IU]
  Filled 2021-05-19: qty 5

## 2021-05-19 MED ORDER — SODIUM CHLORIDE 0.9 % IV SOLN
Freq: Once | INTRAVENOUS | Status: AC
  Filled 2021-05-19: qty 250

## 2021-05-19 MED ORDER — HEPARIN SOD (PORK) LOCK FLUSH 100 UNIT/ML IV SOLN
INTRAVENOUS | Status: AC
  Filled 2021-05-19: qty 5

## 2021-05-19 MED ORDER — SODIUM CHLORIDE 0.9 % IV SOLN
10.0000 mg/kg | Freq: Once | INTRAVENOUS | Status: AC
  Administered 2021-05-19: 860 mg via INTRAVENOUS
  Filled 2021-05-19: qty 10

## 2021-05-19 NOTE — Patient Instructions (Signed)
[facility] ONCOLOGY   Discharge Instructions: Thank you for choosing [facility] to provide your oncology and hematology care.  If you have a lab appointment with the [facility], please go directly to the [facility] and check in at the registration area.  Wear comfortable clothing and clothing appropriate for easy access to any Portacath or PICC line.   We strive to give you quality time with your provider. You may need to reschedule your appointment if you arrive late (15 or more minutes).  Arriving late affects you and other patients whose appointments are after yours.  Also, if you miss three or more appointments without notifying the office, you may be dismissed from the clinic at the provider's discretion.      For prescription refill requests, have your pharmacy contact our office and allow 72 hours for refills to be completed.    Today you received the following chemotherapy and/or immunotherapy agents: Imfinzi.      To help prevent nausea and vomiting after your treatment, we encourage you to take your nausea medication as directed.  BELOW ARE SYMPTOMS THAT SHOULD BE REPORTED IMMEDIATELY: *FEVER GREATER THAN 100.4 F (38 C) OR HIGHER *CHILLS OR SWEATING *NAUSEA AND VOMITING THAT IS NOT CONTROLLED WITH YOUR NAUSEA MEDICATION *UNUSUAL SHORTNESS OF BREATH *UNUSUAL BRUISING OR BLEEDING *URINARY PROBLEMS (pain or burning when urinating, or frequent urination) *BOWEL PROBLEMS (unusual diarrhea, constipation, pain near the anus) TENDERNESS IN MOUTH AND THROAT WITH OR WITHOUT PRESENCE OF ULCERS (sore throat, sores in mouth, or a toothache) UNUSUAL RASH, SWELLING OR PAIN  UNUSUAL VAGINAL DISCHARGE OR ITCHING   Items with * indicate a potential emergency and should be followed up as soon as possible or go to the Emergency Department if any problems should occur.  Please show the CHEMOTHERAPY ALERT CARD or IMMUNOTHERAPY ALERT CARD at check-in  to the Emergency Department and triage nurse.  Should you have questions after your visit or need to cancel or reschedule your appointment, please contact [facility]  [phone] and follow the prompts.  Office hours are 8:00 a.m. to 4:30 p.m. Monday - Friday. Please note that voicemails left after 4:00 p.m. may not be returned until the following business day.  We are closed weekends and major holidays. You have access to a nurse at all times for urgent questions. Please call the main number to the clinic [phone] and follow the prompts.  For any non-urgent questions, you may also contact your provider using MyChart. We now offer e-Visits for anyone 54 and older to request care online for non-urgent symptoms. For details visit [email or web address].   Also download the MyChart app! Go to the app store, search "MyChart", open the app, select , and log in with your MyChart username and password.  Due to Covid, a mask is required upon entering the hospital/clinic. If you do not have a mask, one will be given to you upon arrival. For doctor visits, patients may have 1 support person aged 62 or older with them. For treatment visits, patients cannot have anyone with them due to current Covid guidelines and our immunocompromised population.

## 2021-05-19 NOTE — Progress Notes (Signed)
Creatinine: 1.81. MD, Dr. Janese Banks, notified and aware. Per MD order: proceed with scheduled Imfinzi treatment today.

## 2021-05-19 NOTE — Progress Notes (Signed)
Hematology/Oncology Consult note [facility]  Telephone:([phone] Fax:([phone]  Patient Care Team: Perrin Maltese, MD as PCP - General (Internal Medicine) Telford Nab, RN as Oncology Nurse Navigator Sindy Guadeloupe, MD as Consulting Physician (Hematology and Oncology)   Name of the patient: Martin Jennings  [id number]  1950-11-13   Date of visit: 05/19/21  Diagnosis- stage IIIc adenocarcinoma of the lung cT3 cN3 M0  Chief complaint/ Reason for visit-on treatment assessment prior to cycle 20 of maintenance durvalumab  Heme/Onc history: Patient is a 70 year old male with a history of tobacco dependence who was admitted to the hospital for symptoms of acute onset shortness of breath and was found to have Covid pneumonia.  He underwent CT angio chest which showed extensive mediastinal as well as bilateral hilar adenopathy.  Masslike opacity with architectural distortion involving the right upper lobe measuring 5.7 x 2.9 x 2.3 cm and groundglass opacities bilaterally.  Mass involving the left adrenal gland as well as a smaller right adrenal gland possibly indicating adrenal metastases.     This was followed by a PET CT scan 2 weeks later which showed the consolidative process in the right upper lobe suv 7.88 concerning for infiltrating tumor.  It appears to involve the pleura associated interstitial thickening was also seen.  Bulky right hilar and mediastinal adenopathy.  Enlarged somewhat necrotic appearing left supraclavicular lymph node was also hypermetabolic.  Adrenal gland was not hypermetabolic and was consistent with a benign adenoma.  No metastatic disease involving liver.  No features of distant metastatic disease.   Pathology showed metastatic non-small cell lung cancer which was TTF-1 positive consistent with adenocarcinoma   Patient finished concurrent chemoradiation with weekly CarboTaxol.  Scan showed partial response and patient is currently on  maintenance durvalumab which was started on 07/29/2020  Interval history-patient reports doing well and denies any specific complaints at this time.  Left hip pain is relatively stable.  Has baseline fatigue.  ECOG PS- 1 Pain scale- 0   Review of systems- Review of Systems  Constitutional:  Negative for chills, fever, malaise/fatigue and weight loss.  HENT:  Negative for congestion, ear discharge and nosebleeds.   Eyes:  Negative for blurred vision.  Respiratory:  Negative for cough, hemoptysis, sputum production, shortness of breath and wheezing.   Cardiovascular:  Negative for chest pain, palpitations, orthopnea and claudication.  Gastrointestinal:  Negative for abdominal pain, blood in stool, constipation, diarrhea, heartburn, melena, nausea and vomiting.  Genitourinary:  Negative for dysuria, flank pain, frequency, hematuria and urgency.  Musculoskeletal:  Negative for back pain, joint pain and myalgias.  Skin:  Negative for rash.  Neurological:  Negative for dizziness, tingling, focal weakness, seizures, weakness and headaches.  Endo/Heme/Allergies:  Does not bruise/bleed easily.  Psychiatric/Behavioral:  Negative for depression and suicidal ideas. The patient does not have insomnia.       No Known Allergies   Past Medical History:  Diagnosis Date   Bladder cancer ([place]) 2012   COVID-19 virus infection 04/03/2020   Hypertension    Lung cancer ([place])    Peripheral vascular disease ([place])      Past Surgical History:  Procedure Laterality Date   BLADDER REMOVAL     LOWER EXTREMITY ANGIOGRAPHY Left 04/12/2017   Procedure: Lower Extremity Angiography;  Surgeon: Katha Cabal, MD;  Location: [facility];  Service: Cardiovascular;  Laterality: Left;   PORTA CATH INSERTION N/A 05/22/2020   Procedure: PORTA CATH INSERTION;  Surgeon: Lucky Cowboy,  Erskine Squibb, MD;  Location: [facility];  Service: Cardiovascular;  Laterality: N/A;   uretostomy     VASCULAR SURGERY       Social History   Socioeconomic History   Marital status: Married    Spouse name: Not on file   Number of children: Not on file   Years of education: Not on file   Highest education level: Not on file  Occupational History   Not on file  Tobacco Use   Smoking status: Former    Packs/day: 1.25    Years: 50.00    Pack years: 62.50    Types: Cigarettes    Quit date: 03/13/2020    Years since quitting: 1.1   Smokeless tobacco: Never  Vaping Use   Vaping Use: Never used  Substance and Sexual Activity   Alcohol use: No   Drug use: Not Currently    Types: Marijuana   Sexual activity: Not Currently  Other Topics Concern   Not on file  Social History Narrative   Not on file   Social Determinants of Health   Financial Resource Strain: Not on file  Food Insecurity: Not on file  Transportation Needs: Not on file  Physical Activity: Not on file  Stress: Not on file  Social Connections: Not on file  Intimate Partner Violence: Not on file    History reviewed. No pertinent family history.   Current Outpatient Medications:    amLODipine (NORVASC) 10 MG tablet, Take 10 mg by mouth daily. , Disp: , Rfl:    atorvastatin (LIPITOR) 20 MG tablet, Take 20 mg by mouth every evening. , Disp: , Rfl:    carvedilol (COREG) 6.25 MG tablet, Take 6.25 mg by mouth 2 (two) times daily., Disp: , Rfl:    clopidogrel (PLAVIX) 75 MG tablet, Take 75 mg by mouth daily. , Disp: , Rfl:    lidocaine-prilocaine (EMLA) cream, Apply 1 application topically as needed., Disp: 30 g, Rfl: 2   oxyCODONE (OXY IR/ROXICODONE) 5 MG immediate release tablet, Take 1 tablet (5 mg total) by mouth every 6 (six) hours as needed for severe pain., Disp: 45 tablet, Rfl: 0   azithromycin (ZITHROMAX Z-PAK) 250 MG tablet, Take 2 tablets on day 1 ,then 1 tablet daily until you complete the prescription (Patient not taking: Reported on 05/19/2021), Disp: 6 each, Rfl: 0 No current facility-administered medications for this  visit.  Facility-Administered Medications Ordered in Other Visits:    sodium chloride flush (NS) 0.9 % injection 10 mL, 10 mL, Intravenous, PRN, Sindy Guadeloupe, MD, 10 mL at 09/18/20 0850  Physical exam:  Vitals:   05/19/21 1253  BP: (!) 143/81  Pulse: 72  Resp: 18  Temp: 97.8 F (36.6 C)  SpO2: 97%  Weight: 191 lb 12.8 oz (87 kg)   Physical Exam Constitutional:      General: He is not in acute distress. Cardiovascular:     Rate and Rhythm: Normal rate and regular rhythm.     Heart sounds: Normal heart sounds.  Pulmonary:     Effort: Pulmonary effort is normal.     Breath sounds: Normal breath sounds.  Abdominal:     General: Bowel sounds are normal.     Palpations: Abdomen is soft.  Skin:    General: Skin is warm and dry.  Neurological:     Mental Status: He is alert and oriented to person, place, and time.     CMP Latest Ref Rng & Units 05/19/2021  Glucose  70 - 99 mg/dL 95  BUN 8 - 23 mg/dL 22  Creatinine 0.61 - 1.24 mg/dL 1.81(H)  Sodium 135 - 145 mmol/L 136  Potassium 3.5 - 5.1 mmol/L 3.7  Chloride 98 - 111 mmol/L 106  CO2 22 - 32 mmol/L 26  Calcium 8.9 - 10.3 mg/dL 8.9  Total Protein 6.5 - 8.1 g/dL 7.2  Total Bilirubin 0.3 - 1.2 mg/dL 0.6  Alkaline Phos 38 - 126 U/L 66  AST 15 - 41 U/L 17  ALT 0 - 44 U/L 14   CBC Latest Ref Rng & Units 05/19/2021  WBC 4.0 - 10.5 K/uL 6.6  Hemoglobin 13.0 - 17.0 g/dL 14.4  Hematocrit 39.0 - 52.0 % 43.6  Platelets 150 - 400 K/uL 203     Assessment and plan- Patient is a 70 y.o. male with stage IIIc adenocarcinoma of the right upper lobe of the lung cT4 N3 M0.  He is here for on treatment assessment prior to cycle 20 of maintenance durvalumab   Counts okay to proceed with cycle 20 of maintenance durvalumab today and I will see him back in 2 weeks for cycle 21.  Plan to repeat CT chest abdomen pelvis without contrast in 2 to 3 weeks time.  Overall patient is tolerating treatment well without any significant side effects.   Plan is to complete 26 cycles   Visit Diagnosis 1. Malignant neoplasm of upper lobe of right lung ([place])   2. Encounter for antineoplastic immunotherapy      Dr. Randa Evens, MD, MPH [facility] at [facility] [phone] 05/19/2021 4:32 PM

## 2021-05-20 DIAGNOSIS — Z936 Other artificial openings of urinary tract status: Secondary | ICD-10-CM | POA: Diagnosis not present

## 2021-05-22 ENCOUNTER — Other Ambulatory Visit: Payer: Self-pay | Admitting: Oncology

## 2021-05-22 ENCOUNTER — Telehealth: Payer: Self-pay | Admitting: *Deleted

## 2021-05-22 NOTE — Telephone Encounter (Signed)
I would not recommend azithromycin beyond 5 days. Would just watch symptoms for now

## 2021-05-22 NOTE — Telephone Encounter (Signed)
Call returned to patient and advised of doctor response, He agreed that he will call back if he starts running fever, cough worsens he develops shortness of breath or any other symptoms,

## 2021-05-22 NOTE — Telephone Encounter (Signed)
Patient called requesting refill of his Azithromycin for his cough. He does report that he has improved, but still has cough producing yellow sputum and feels he needs more antibiotics . He denies fever, shortness of breath, or other symptoms Please advise

## 2021-06-02 ENCOUNTER — Other Ambulatory Visit: Payer: Self-pay

## 2021-06-02 ENCOUNTER — Inpatient Hospital Stay: Payer: Medicare Other

## 2021-06-02 ENCOUNTER — Inpatient Hospital Stay: Payer: Medicare Other | Attending: Oncology

## 2021-06-02 ENCOUNTER — Encounter: Payer: Self-pay | Admitting: Oncology

## 2021-06-02 ENCOUNTER — Inpatient Hospital Stay (HOSPITAL_BASED_OUTPATIENT_CLINIC_OR_DEPARTMENT_OTHER): Payer: Medicare Other | Admitting: Oncology

## 2021-06-02 VITALS — BP 116/70 | HR 71 | Temp 96.9°F | Resp 20 | Wt 191.5 lb

## 2021-06-02 DIAGNOSIS — Z79899 Other long term (current) drug therapy: Secondary | ICD-10-CM | POA: Insufficient documentation

## 2021-06-02 DIAGNOSIS — I10 Essential (primary) hypertension: Secondary | ICD-10-CM | POA: Insufficient documentation

## 2021-06-02 DIAGNOSIS — Z5112 Encounter for antineoplastic immunotherapy: Secondary | ICD-10-CM | POA: Diagnosis not present

## 2021-06-02 DIAGNOSIS — Z8551 Personal history of malignant neoplasm of bladder: Secondary | ICD-10-CM | POA: Insufficient documentation

## 2021-06-02 DIAGNOSIS — Z87891 Personal history of nicotine dependence: Secondary | ICD-10-CM | POA: Insufficient documentation

## 2021-06-02 DIAGNOSIS — C3401 Malignant neoplasm of right main bronchus: Secondary | ICD-10-CM

## 2021-06-02 DIAGNOSIS — C3411 Malignant neoplasm of upper lobe, right bronchus or lung: Secondary | ICD-10-CM | POA: Insufficient documentation

## 2021-06-02 DIAGNOSIS — Z95828 Presence of other vascular implants and grafts: Secondary | ICD-10-CM

## 2021-06-02 LAB — CBC WITH DIFFERENTIAL/PLATELET
Abs Immature Granulocytes: 0.01 10*3/uL (ref 0.00–0.07)
Basophils Absolute: 0 10*3/uL (ref 0.0–0.1)
Basophils Relative: 0 %
Eosinophils Absolute: 0.4 10*3/uL (ref 0.0–0.5)
Eosinophils Relative: 7 %
HCT: 42.5 % (ref 39.0–52.0)
Hemoglobin: 13.9 g/dL (ref 13.0–17.0)
Immature Granulocytes: 0 %
Lymphocytes Relative: 13 %
Lymphs Abs: 0.8 10*3/uL (ref 0.7–4.0)
MCH: 30.8 pg (ref 26.0–34.0)
MCHC: 32.7 g/dL (ref 30.0–36.0)
MCV: 94 fL (ref 80.0–100.0)
Monocytes Absolute: 0.6 10*3/uL (ref 0.1–1.0)
Monocytes Relative: 10 %
Neutro Abs: 4.3 10*3/uL (ref 1.7–7.7)
Neutrophils Relative %: 70 %
Platelets: 175 10*3/uL (ref 150–400)
RBC: 4.52 MIL/uL (ref 4.22–5.81)
RDW: 12.8 % (ref 11.5–15.5)
WBC: 6.1 10*3/uL (ref 4.0–10.5)
nRBC: 0 % (ref 0.0–0.2)

## 2021-06-02 LAB — COMPREHENSIVE METABOLIC PANEL
ALT: 10 U/L (ref 0–44)
AST: 11 U/L — ABNORMAL LOW (ref 15–41)
Albumin: 3.4 g/dL — ABNORMAL LOW (ref 3.5–5.0)
Alkaline Phosphatase: 58 U/L (ref 38–126)
Anion gap: 9 (ref 5–15)
BUN: 23 mg/dL (ref 8–23)
CO2: 23 mmol/L (ref 22–32)
Calcium: 8.5 mg/dL — ABNORMAL LOW (ref 8.9–10.3)
Chloride: 106 mmol/L (ref 98–111)
Creatinine, Ser: 1.89 mg/dL — ABNORMAL HIGH (ref 0.61–1.24)
GFR, Estimated: 38 mL/min — ABNORMAL LOW (ref >60)
Glucose, Bld: 108 mg/dL — ABNORMAL HIGH (ref 70–99)
Potassium: 3.8 mmol/L (ref 3.5–5.1)
Sodium: 138 mmol/L (ref 135–145)
Total Bilirubin: 0.5 mg/dL (ref 0.3–1.2)
Total Protein: 6.7 g/dL (ref 6.5–8.1)

## 2021-06-02 MED ORDER — SODIUM CHLORIDE 0.9 % IV SOLN
Freq: Once | INTRAVENOUS | Status: AC
  Filled 2021-06-02: qty 250

## 2021-06-02 MED ORDER — SODIUM CHLORIDE 0.9% FLUSH
10.0000 mL | Freq: Once | INTRAVENOUS | Status: AC
  Administered 2021-06-02: 10 mL via INTRAVENOUS
  Filled 2021-06-02: qty 10

## 2021-06-02 MED ORDER — HEPARIN SOD (PORK) LOCK FLUSH 100 UNIT/ML IV SOLN
500.0000 [IU] | Freq: Once | INTRAVENOUS | Status: DC | PRN
  Filled 2021-06-02: qty 5

## 2021-06-02 MED ORDER — SODIUM CHLORIDE 0.9 % IV SOLN
10.0000 mg/kg | Freq: Once | INTRAVENOUS | Status: AC
  Administered 2021-06-02: 860 mg via INTRAVENOUS
  Filled 2021-06-02: qty 10

## 2021-06-02 MED ORDER — HEPARIN SOD (PORK) LOCK FLUSH 100 UNIT/ML IV SOLN
500.0000 [IU] | Freq: Once | INTRAVENOUS | Status: DC
  Filled 2021-06-02: qty 5

## 2021-06-02 MED ORDER — HEPARIN SOD (PORK) LOCK FLUSH 100 UNIT/ML IV SOLN
INTRAVENOUS | Status: AC
  Administered 2021-06-02: 500 [IU]
  Filled 2021-06-02: qty 5

## 2021-06-02 NOTE — Progress Notes (Signed)
Pt states he has been feeling more tired here lately, dealing with diarrhea and a new dry cough going on three-four days which  makes him feel SOB at times.

## 2021-06-02 NOTE — Patient Instructions (Signed)
[facility] ONCOLOGY  Discharge Instructions: Thank you for choosing [facility] to provide your oncology and hematology care.  If you have a lab appointment with the [facility], please go directly to the [facility] and check in at the registration area.  Wear comfortable clothing and clothing appropriate for easy access to any Portacath or PICC line.   We strive to give you quality time with your provider. You may need to reschedule your appointment if you arrive late (15 or more minutes).  Arriving late affects you and other patients whose appointments are after yours.  Also, if you miss three or more appointments without notifying the office, you may be dismissed from the clinic at the provider's discretion.      For prescription refill requests, have your pharmacy contact our office and allow 72 hours for refills to be completed.    Today you received the following chemotherapy and/or immunotherapy agents: Imfinzi      To help prevent nausea and vomiting after your treatment, we encourage you to take your nausea medication as directed.  BELOW ARE SYMPTOMS THAT SHOULD BE REPORTED IMMEDIATELY: *FEVER GREATER THAN 100.4 F (38 C) OR HIGHER *CHILLS OR SWEATING *NAUSEA AND VOMITING THAT IS NOT CONTROLLED WITH YOUR NAUSEA MEDICATION *UNUSUAL SHORTNESS OF BREATH *UNUSUAL BRUISING OR BLEEDING *URINARY PROBLEMS (pain or burning when urinating, or frequent urination) *BOWEL PROBLEMS (unusual diarrhea, constipation, pain near the anus) TENDERNESS IN MOUTH AND THROAT WITH OR WITHOUT PRESENCE OF ULCERS (sore throat, sores in mouth, or a toothache) UNUSUAL RASH, SWELLING OR PAIN  UNUSUAL VAGINAL DISCHARGE OR ITCHING   Items with * indicate a potential emergency and should be followed up as soon as possible or go to the Emergency Department if any problems should occur.  Please show the CHEMOTHERAPY ALERT CARD or IMMUNOTHERAPY ALERT CARD at check-in to  the Emergency Department and triage nurse.  Should you have questions after your visit or need to cancel or reschedule your appointment, please contact [facility]  [phone] and follow the prompts.  Office hours are 8:00 a.m. to 4:30 p.m. Monday - Friday. Please note that voicemails left after 4:00 p.m. may not be returned until the following business day.  We are closed weekends and major holidays. You have access to a nurse at all times for urgent questions. Please call the main number to the clinic [phone] and follow the prompts.  For any non-urgent questions, you may also contact your provider using MyChart. We now offer e-Visits for anyone 8 and older to request care online for non-urgent symptoms. For details visit [email or web address].   Also download the MyChart app! Go to the app store, search "MyChart", open the app, select [facility], and log in with your MyChart username and password.  Due to Covid, a mask is required upon entering the hospital/clinic. If you do not have a mask, one will be given to you upon arrival. For doctor visits, patients may have 1 support person aged 64 or older with them. For treatment visits, patients cannot have anyone with them due to current Covid guidelines and our immunocompromised population.

## 2021-06-02 NOTE — Progress Notes (Signed)
Hematology/Oncology Consult note [facility]  [phone] [phone]  Patient Care Team: Perrin Maltese, MD as PCP - General (Internal Medicine) Telford Nab, RN as Oncology Nurse Navigator Sindy Guadeloupe, MD as Consulting Physician (Hematology and Oncology)   Name of the patient: Martin Jennings  [id number]  [birth year]   Date of visit: 06/02/21  Diagnosis- stage IIIc adenocarcinoma of the lung cT3 cN3 M0    Chief complaint/ Reason for visit-on treatment assessment prior to cycle 21 of maintenance durvalumab  Heme/Onc history:  Patient is a 70 year old male with a history of tobacco dependence who was admitted to the hospital for symptoms of acute onset shortness of breath and was found to have Covid pneumonia.  He underwent CT angio chest which showed extensive mediastinal as well as bilateral hilar adenopathy.  Masslike opacity with architectural distortion involving the right upper lobe measuring 5.7 x 2.9 x 2.3 cm and groundglass opacities bilaterally.  Mass involving the left adrenal gland as well as a smaller right adrenal gland possibly indicating adrenal metastases.     This was followed by a PET CT scan 2 weeks later which showed the consolidative process in the right upper lobe suv 7.88 concerning for infiltrating tumor.  It appears to involve the pleura associated interstitial thickening was also seen.  Bulky right hilar and mediastinal adenopathy.  Enlarged somewhat necrotic appearing left supraclavicular lymph node was also hypermetabolic.  Adrenal gland was not hypermetabolic and was consistent with a benign adenoma.  No metastatic disease involving liver.  No features of distant metastatic disease.   Pathology showed metastatic non-small cell lung cancer which was TTF-1 positive consistent with adenocarcinoma   Patient finished concurrent chemoradiation with weekly CarboTaxol.  Scan showed partial response and patient is currently  on maintenance durvalumab which was started on 07/29/2020  Interval history-patient is tolerating treatment well and denies any specific complaints at this time.  ECOG PS- 1 Pain scale- 0   Review of systems- Review of Systems  Constitutional:  Negative for chills, fever, malaise/fatigue and weight loss.  HENT:  Negative for congestion, ear discharge and nosebleeds.   Eyes:  Negative for blurred vision.  Respiratory:  Negative for cough, hemoptysis, sputum production, shortness of breath and wheezing.   Cardiovascular:  Negative for chest pain, palpitations, orthopnea and claudication.  Gastrointestinal:  Negative for abdominal pain, blood in stool, constipation, diarrhea, heartburn, melena, nausea and vomiting.  Genitourinary:  Negative for dysuria, flank pain, frequency, hematuria and urgency.  Musculoskeletal:  Negative for back pain, joint pain and myalgias.  Skin:  Negative for rash.  Neurological:  Negative for dizziness, tingling, focal weakness, seizures, weakness and headaches.  Endo/Heme/Allergies:  Does not bruise/bleed easily.  Psychiatric/Behavioral:  Negative for depression and suicidal ideas. The patient does not have insomnia.       No Known Allergies   Past Medical History:  Diagnosis Date   Bladder cancer ([place]) 2012   COVID-19 virus infection 04/03/2020   Hypertension    Lung cancer ([place])    Peripheral vascular disease ([place])      Past Surgical History:  Procedure Laterality Date   BLADDER REMOVAL     LOWER EXTREMITY ANGIOGRAPHY Left 04/12/2017   Procedure: Lower Extremity Angiography;  Surgeon: Katha Cabal, MD;  Location: [place] [facility];  Service: Cardiovascular;  Laterality: Left;   PORTA CATH INSERTION N/A 05/22/2020   Procedure: PORTA CATH INSERTION;  Surgeon: Algernon Huxley, MD;  Location: [place]  [facility];  Service: Cardiovascular;  Laterality: N/A;   uretostomy     VASCULAR SURGERY      Social History   Socioeconomic History    Marital status: Married    Spouse name: Not on file   Number of children: Not on file   Years of education: Not on file   Highest education level: Not on file  Occupational History   Not on file  Tobacco Use   Smoking status: Former    Packs/day: 1.25    Years: 50.00    Pack years: 62.50    Types: Cigarettes    Quit date: 03/13/2020    Years since quitting: 1.2   Smokeless tobacco: Never  Vaping Use   Vaping Use: Never used  Substance and Sexual Activity   Alcohol use: No   Drug use: Not Currently    Types: Marijuana   Sexual activity: Not Currently  Other Topics Concern   Not on file  Social History Narrative   Not on file   Social Determinants of Health   Financial Resource Strain: Not on file  Food Insecurity: Not on file  Transportation Needs: Not on file  Physical Activity: Not on file  Stress: Not on file  Social Connections: Not on file  Intimate Partner Violence: Not on file    History reviewed. No pertinent family history.   Current Outpatient Medications:    amLODipine (NORVASC) 10 MG tablet, Take 10 mg by mouth daily. , Disp: , Rfl:    atorvastatin (LIPITOR) 20 MG tablet, Take 20 mg by mouth every evening. , Disp: , Rfl:    carvedilol (COREG) 6.25 MG tablet, Take 6.25 mg by mouth 2 (two) times daily., Disp: , Rfl:    clopidogrel (PLAVIX) 75 MG tablet, Take 75 mg by mouth daily. , Disp: , Rfl:    lidocaine-prilocaine (EMLA) cream, Apply 1 application topically as needed., Disp: 30 g, Rfl: 2   azithromycin (ZITHROMAX Z-PAK) 250 MG tablet, Take 2 tablets on day 1 ,then 1 tablet daily until you complete the prescription (Patient not taking: No sig reported), Disp: 6 each, Rfl: 0   oxyCODONE (OXY IR/ROXICODONE) 5 MG immediate release tablet, Take 1 tablet (5 mg total) by mouth every 6 (six) hours as needed for severe pain. (Patient not taking: Reported on 06/02/2021), Disp: 45 tablet, Rfl: 0 No current facility-administered medications for this  visit.  Facility-Administered Medications Ordered in Other Visits:    heparin lock flush 100 unit/mL, 500 Units, Intracatheter, Once PRN, Sindy Guadeloupe, MD   sodium chloride flush (NS) 0.9 % injection 10 mL, 10 mL, Intravenous, PRN, Sindy Guadeloupe, MD, 10 mL at 09/18/20 0850  Physical exam:  Vitals:   06/02/21 0902  BP: 116/70  Pulse: 71  Resp: 20  Temp: (!) 96.9 F (36.1 C)  SpO2: 97%  Weight: 191 lb 8 oz (86.9 kg)   Physical Exam Constitutional:      General: He is not in acute distress. Cardiovascular:     Rate and Rhythm: Normal rate and regular rhythm.     Heart sounds: Normal heart sounds.  Pulmonary:     Effort: Pulmonary effort is normal.     Breath sounds: Normal breath sounds.  Abdominal:     General: Bowel sounds are normal.     Palpations: Abdomen is soft.  Skin:    General: Skin is warm and dry.  Neurological:     Mental Status: He is alert and oriented to person, place,  and time.     CMP Latest Ref Rng & Units 06/02/2021  Glucose 70 - 99 mg/dL 108(H)  BUN 8 - 23 mg/dL 23  Creatinine 0.61 - 1.24 mg/dL 1.89(H)  Sodium 135 - 145 mmol/L 138  Potassium 3.5 - 5.1 mmol/L 3.8  Chloride 98 - 111 mmol/L 106  CO2 22 - 32 mmol/L 23  Calcium 8.9 - 10.3 mg/dL 8.5(L)  Total Protein 6.5 - 8.1 g/dL 6.7  Total Bilirubin 0.3 - 1.2 mg/dL 0.5  Alkaline Phos 38 - 126 U/L 58  AST 15 - 41 U/L 11(L)  ALT 0 - 44 U/L 10   CBC Latest Ref Rng & Units 06/02/2021  WBC 4.0 - 10.5 K/uL 6.1  Hemoglobin 13.0 - 17.0 g/dL 13.9  Hematocrit 39.0 - 52.0 % 42.5  Platelets 150 - 400 K/uL 175     Assessment and plan- Patient is a 70 y.o. male with stage IIIc adenocarcinoma of the right upper lobe of the lung cT4 N3 M0.  He is here for on treatment assessment prior to cycle 21 of maintenance durvalumab  Counts okay to proceed with cycle 21 of maintenance durvalumab today.  He is scheduled for CT chest abdomen and pelvis without contrast next week.  I will plan to see him in 2 weeks for  cycle 22.  Plan is to complete 24 cycles and then watch him off treatment with periodic scans every 3 months  Visit Diagnosis 1. Malignant neoplasm of upper lobe of right lung ([place])   2. Encounter for antineoplastic immunotherapy      Dr. Randa Evens, MD, MPH [facility] at [facility] [phone] 06/02/2021 11:59 AM

## 2021-06-02 NOTE — Progress Notes (Signed)
Per Dr. Janese Banks, patient can receive Imfinzi today with a creatinine of 1.89.

## 2021-06-05 DIAGNOSIS — I739 Peripheral vascular disease, unspecified: Secondary | ICD-10-CM | POA: Diagnosis not present

## 2021-06-05 DIAGNOSIS — A46 Erysipelas: Secondary | ICD-10-CM | POA: Diagnosis not present

## 2021-06-05 DIAGNOSIS — M109 Gout, unspecified: Secondary | ICD-10-CM | POA: Diagnosis not present

## 2021-06-05 IMAGING — CT NM PET TUM IMG INITIAL (PI) SKULL BASE T - THIGH
9 of 10 series · 17 of 25 positions shown · non-contrast
Comparison: Chest CT 04/05/2020

CLINICAL DATA: Initial treatment strategy for Right upper lobe lung
lesion and mediastinal and hilar adenopathy. Prior history of
bladder cancer.

EXAM:
NUCLEAR MEDICINE PET SKULL BASE TO THIGH
TECHNIQUE: 9.33 mCi F-18 FDG was injected intravenously. Full-ring PET imaging
was performed from the skull base to thigh after the radiotracer. CT
data was obtained and used for attenuation correction and anatomic
localization.
Fasting blood glucose: 122 mg/dl

[Series 3: ct wb 5.0 b30f · axial · 5.0mm · 0.98mm/px · z∈[-426,+558]mm · 2 of 329 slices shown]
[im 1/329]
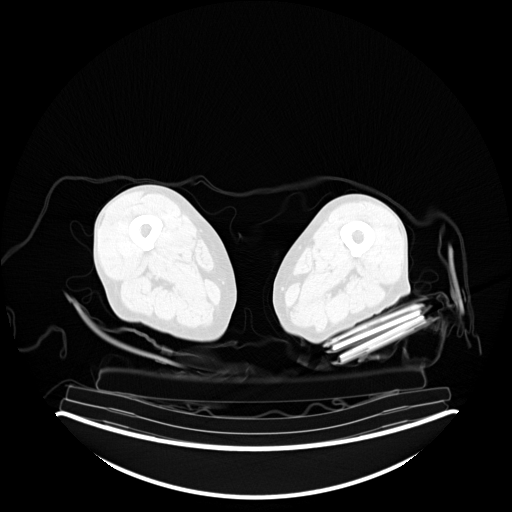
[im 329/329  brain]
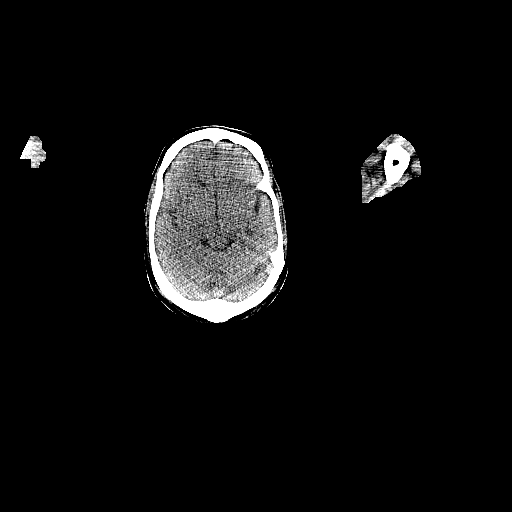

[Series 5: pet wb uncorrected (nac) · axial · 5.0mm · 4.07mm/px · z∈[-426,+558]mm · 2 of 329 slices shown]
[im 1/329]
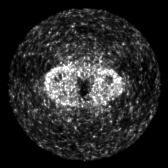
[im 329/329]
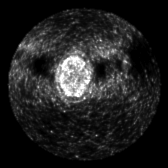

[Series 6: pet wb (ac) · axial · 5.0mm · 2.91mm/px · z∈[-426,+558]mm · 3 of 329 slices shown]
[im 1/329]
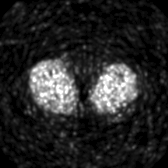
[im 219/329]
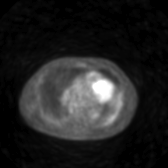
[im 329/329]
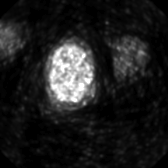

[Series 603: axial fused · 3 of 328 slices shown]
[im 110/328]
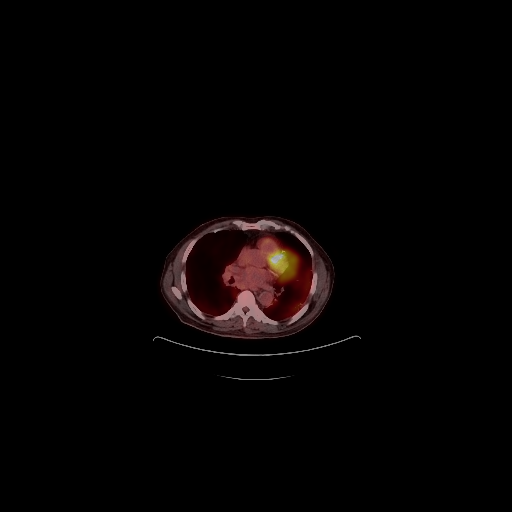
[im 219/328]
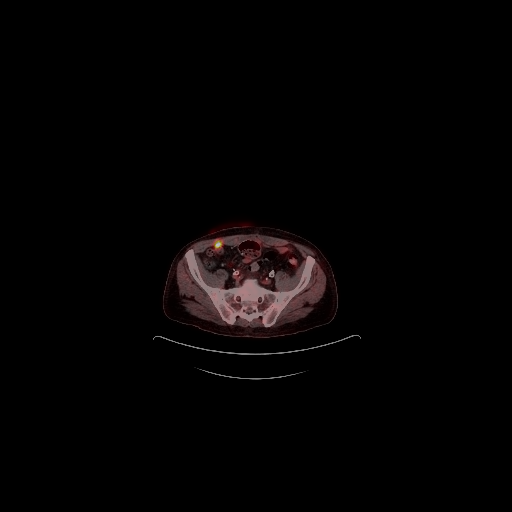
[im 328/328]
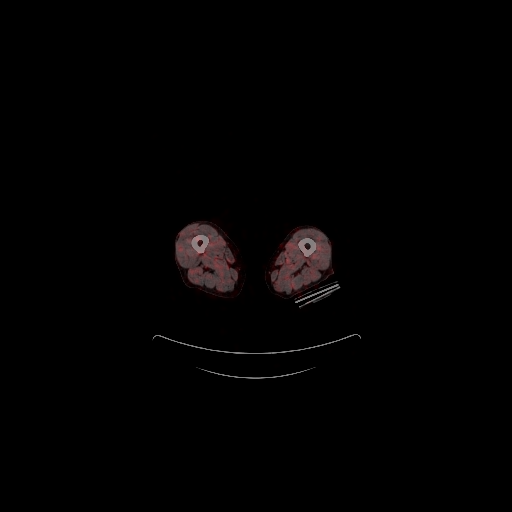

[Series 605: sagittal fused · 2 of 149 slices shown]
[im 1/149]
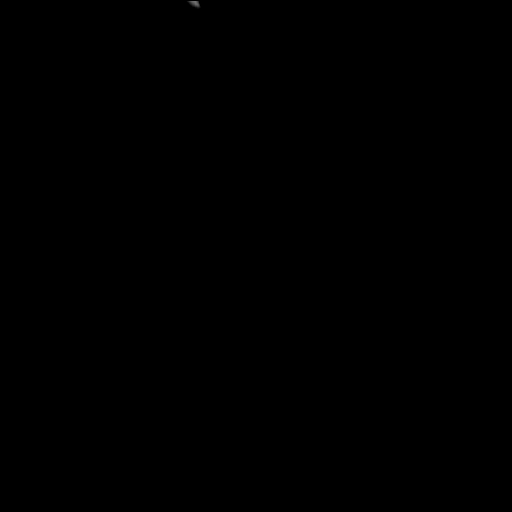
[im 149/149]
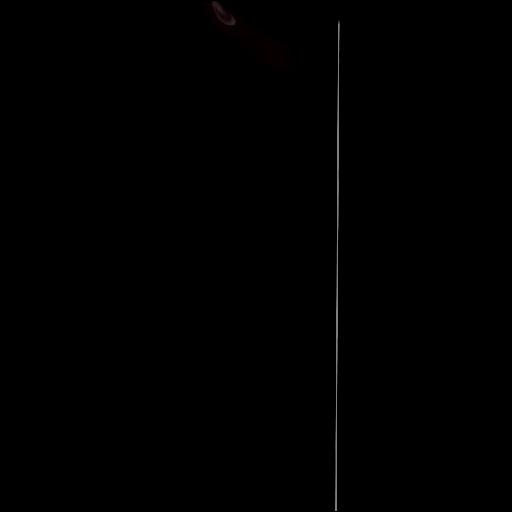

[Series 606: pet axial · 2 of 328 slices shown]
[im 110/328]
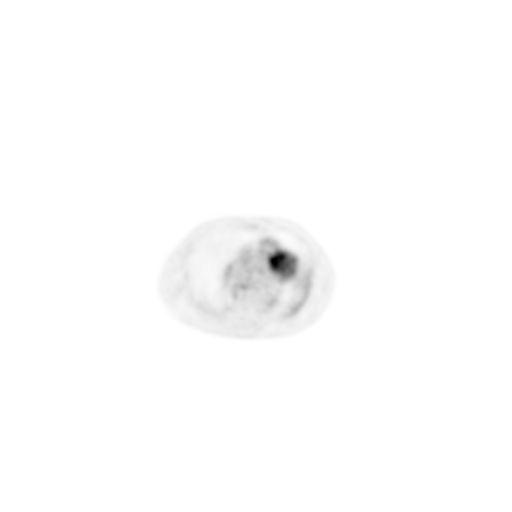
[im 219/328]
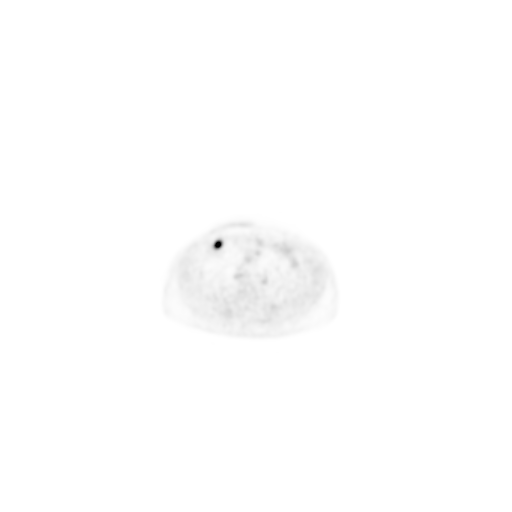

[Series 607: pet coronal · 1 of 94 slices shown]
[im 1/94]
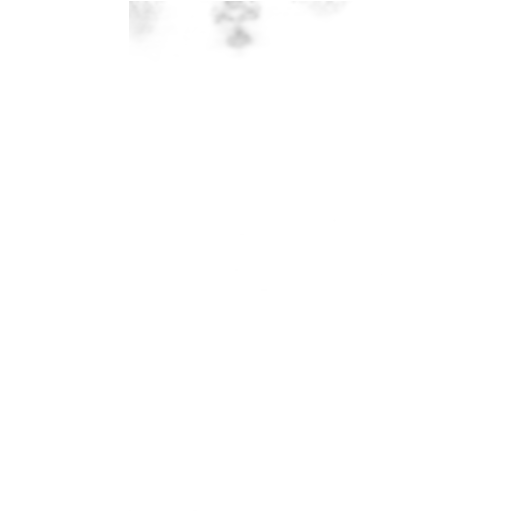

[Series 608: pet sagittal · 1 of 141 slices shown]
[im 1/141]
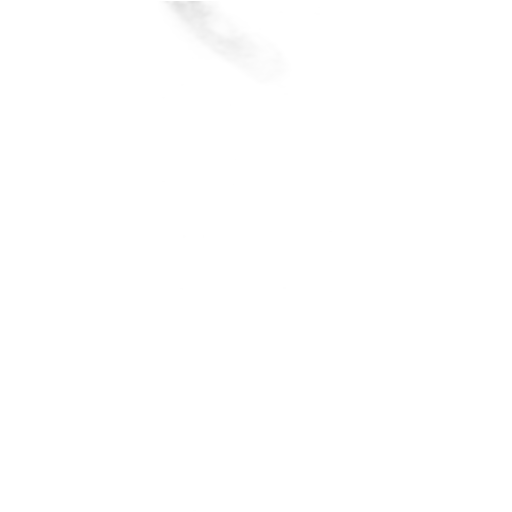

[Series 1110: results mm oncology reading · 1.0mm · 0.45mm/px · 1 of 8 slices shown]
[im 1/8]
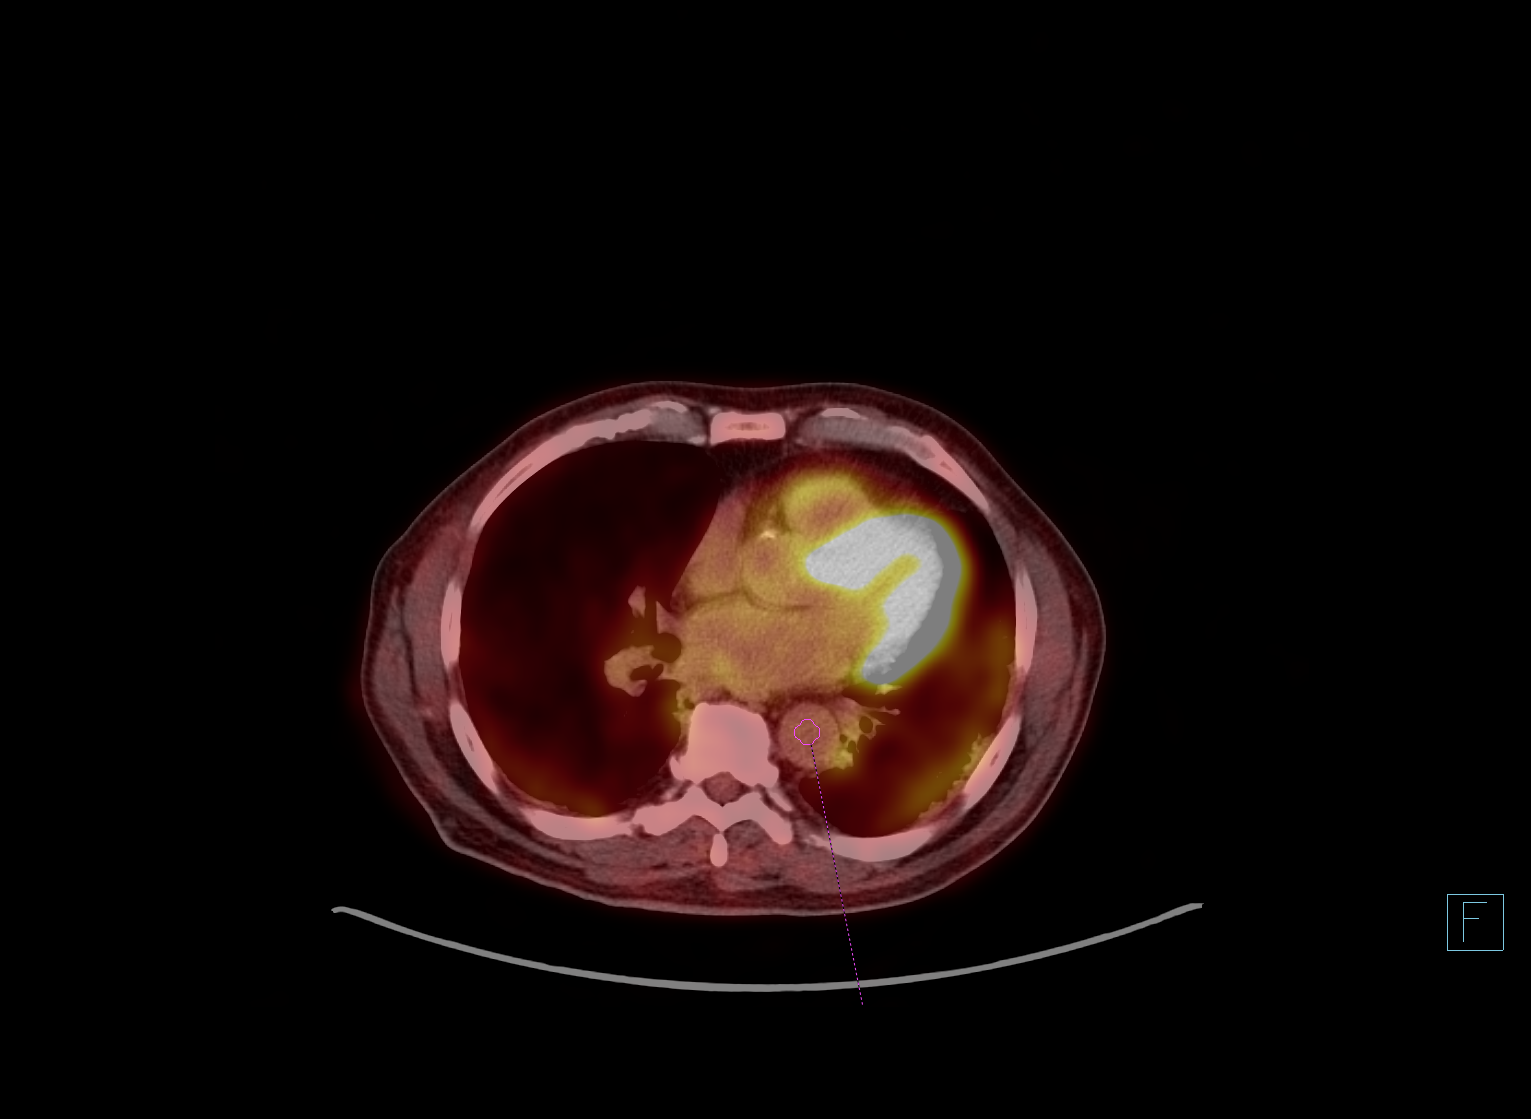

[17 of 25 positions shown; findings below may reference images not displayed]

FINDINGS: Mediastinal blood pool activity: SUV max

Liver activity: SUV max NA

NECK: No neck mass or upper cervical adenopathy. There is an
enlarged somewhat necrotic appearing left supraclavicular lymph node
measuring approximately 12 mm on image 57/3. This is hypermetabolic
with SUV max of 5.71.

Incidental CT findings: none

CHEST: Ill-defined consolidative process in the right upper lobe
peripherally is hypermetabolic with SUV max of 7.88. This is
certainly worrisome for infiltrating tumor. It does appear to
involve the pleura and is also significant surrounding interstitial
thickening which could represent interstitial spread of tumor. An
infectious process is also possible but felt to be unlikely.

Bulky right hilar and mediastinal lymphadenopathy is hypermetabolic
and worrisome for neoplastic lymphadenopathy. 2.3 cm right
paratracheal node on image 91/3 has an SUV max of [REDACTED] mm right hilar node on image 107/3 has an SUV max of [REDACTED] mm subcarinal lymph node on image 108/3 has an SUV max of 5.31.

Patchy nodular opacities at the level lung base along with extensive
interstitial thickening in the left lung demonstrates areas of mild
hypermetabolism. SUV max is 3.55. This appears somewhat progressive
since the prior CT scan and could reflect infection or tumor.

Incidental CT findings: Age advanced atherosclerotic calcifications
involving the aorta and coronary arteries.

Underlying emphysematous changes and interstitial lung disease.

ABDOMEN/PELVIS: Abdomen left adrenal gland lesion is not
hypermetabolic and is low attenuation in and consistent with a
benign adenoma. No findings suspicious for metastatic disease
involving the liver. No abdominal/pelvic lymphadenopathy.

Incidental CT findings: Age advanced atherosclerotic calcifications
involving the aorta and branch vessels. Aortoiliac stents are noted.
There is also evidence for prior fem-fem bypass graft.

Small gallstone noted the gallbladder.

Status post cystectomy and prostatectomy with ileal conduit.

SKELETON: No significant bony findings. No evidence of metastatic
bone disease.

Incidental CT findings: none
IMPRESSION: 1. Hypermetabolic ill-defined right upper lobe nodular irregular
airspace process suspicious for infiltrating neoplasm. There is also
hypermetabolic right hilar and mediastinal lymphadenopathy.
Metastatic disease is also possible but seems less likely.
2. Hypermetabolic left supraclavicular lymph node may be amenable to
ultrasound-guided biopsy.
3. Progressive nodular airspace process in the left lower lobe
demonstrating mild hypermetabolism. Infection versus neoplasm.
4. No abdominal/pelvic metastatic disease.
5. Surgical changes from cysto prostatectomy and ileal conduit.
6. Significant age advanced vascular disease.

## 2021-06-11 ENCOUNTER — Ambulatory Visit
Admission: RE | Admit: 2021-06-11 | Discharge: 2021-06-11 | Disposition: A | Discharge status: Home / Self Care | Payer: Medicare Other | Source: Ambulatory Visit | Attending: Oncology | Admitting: Oncology

## 2021-06-11 ENCOUNTER — Other Ambulatory Visit: Payer: Self-pay

## 2021-06-11 DIAGNOSIS — C3411 Malignant neoplasm of upper lobe, right bronchus or lung: Secondary | ICD-10-CM | POA: Insufficient documentation

## 2021-06-11 DIAGNOSIS — D3500 Benign neoplasm of unspecified adrenal gland: Secondary | ICD-10-CM | POA: Diagnosis not present

## 2021-06-11 DIAGNOSIS — K429 Umbilical hernia without obstruction or gangrene: Secondary | ICD-10-CM | POA: Diagnosis not present

## 2021-06-11 DIAGNOSIS — I700 Atherosclerosis of aorta: Secondary | ICD-10-CM | POA: Diagnosis not present

## 2021-06-11 DIAGNOSIS — C3490 Malignant neoplasm of unspecified part of unspecified bronchus or lung: Secondary | ICD-10-CM | POA: Diagnosis not present

## 2021-06-11 DIAGNOSIS — J432 Centrilobular emphysema: Secondary | ICD-10-CM | POA: Diagnosis not present

## 2021-06-11 DIAGNOSIS — K449 Diaphragmatic hernia without obstruction or gangrene: Secondary | ICD-10-CM | POA: Diagnosis not present

## 2021-06-11 DIAGNOSIS — I2510 Atherosclerotic heart disease of native coronary artery without angina pectoris: Secondary | ICD-10-CM | POA: Diagnosis not present

## 2021-06-15 ENCOUNTER — Other Ambulatory Visit: Payer: Self-pay | Admitting: *Deleted

## 2021-06-15 DIAGNOSIS — C3411 Malignant neoplasm of upper lobe, right bronchus or lung: Secondary | ICD-10-CM

## 2021-06-16 ENCOUNTER — Inpatient Hospital Stay: Payer: Medicare Other

## 2021-06-16 ENCOUNTER — Encounter: Payer: Self-pay | Admitting: Oncology

## 2021-06-16 ENCOUNTER — Other Ambulatory Visit: Payer: Self-pay

## 2021-06-16 ENCOUNTER — Inpatient Hospital Stay (HOSPITAL_BASED_OUTPATIENT_CLINIC_OR_DEPARTMENT_OTHER): Payer: Medicare Other | Admitting: Oncology

## 2021-06-16 VITALS — BP 134/74 | HR 81 | Temp 97.8°F | Resp 18 | Wt 190.2 lb

## 2021-06-16 DIAGNOSIS — Z5112 Encounter for antineoplastic immunotherapy: Secondary | ICD-10-CM

## 2021-06-16 DIAGNOSIS — Z87891 Personal history of nicotine dependence: Secondary | ICD-10-CM | POA: Diagnosis not present

## 2021-06-16 DIAGNOSIS — C3411 Malignant neoplasm of upper lobe, right bronchus or lung: Secondary | ICD-10-CM

## 2021-06-16 DIAGNOSIS — I10 Essential (primary) hypertension: Secondary | ICD-10-CM | POA: Diagnosis not present

## 2021-06-16 DIAGNOSIS — C3401 Malignant neoplasm of right main bronchus: Secondary | ICD-10-CM

## 2021-06-16 DIAGNOSIS — Z8551 Personal history of malignant neoplasm of bladder: Secondary | ICD-10-CM | POA: Diagnosis not present

## 2021-06-16 DIAGNOSIS — Z79899 Other long term (current) drug therapy: Secondary | ICD-10-CM | POA: Diagnosis not present

## 2021-06-16 LAB — COMPREHENSIVE METABOLIC PANEL
ALT: 17 U/L (ref 0–44)
AST: 17 U/L (ref 15–41)
Albumin: 3.8 g/dL (ref 3.5–5.0)
Alkaline Phosphatase: 68 U/L (ref 38–126)
Anion gap: 7 (ref 5–15)
BUN: 33 mg/dL — ABNORMAL HIGH (ref 8–23)
CO2: 24 mmol/L (ref 22–32)
Calcium: 9.1 mg/dL (ref 8.9–10.3)
Chloride: 107 mmol/L (ref 98–111)
Creatinine, Ser: 1.91 mg/dL — ABNORMAL HIGH (ref 0.61–1.24)
GFR, Estimated: 37 mL/min — ABNORMAL LOW (ref >60)
Glucose, Bld: 113 mg/dL — ABNORMAL HIGH (ref 70–99)
Potassium: 4 mmol/L (ref 3.5–5.1)
Sodium: 138 mmol/L (ref 135–145)
Total Bilirubin: 0.2 mg/dL — ABNORMAL LOW (ref 0.3–1.2)
Total Protein: 7.5 g/dL (ref 6.5–8.1)

## 2021-06-16 LAB — CBC WITH DIFFERENTIAL/PLATELET
Abs Immature Granulocytes: 0.03 10*3/uL (ref 0.00–0.07)
Basophils Absolute: 0 10*3/uL (ref 0.0–0.1)
Basophils Relative: 0 %
Eosinophils Absolute: 0.6 10*3/uL — ABNORMAL HIGH (ref 0.0–0.5)
Eosinophils Relative: 9 %
HCT: 44 % (ref 39.0–52.0)
Hemoglobin: 14.4 g/dL (ref 13.0–17.0)
Immature Granulocytes: 0 %
Lymphocytes Relative: 18 %
Lymphs Abs: 1.4 10*3/uL (ref 0.7–4.0)
MCH: 30.8 pg (ref 26.0–34.0)
MCHC: 32.7 g/dL (ref 30.0–36.0)
MCV: 94 fL (ref 80.0–100.0)
Monocytes Absolute: 0.5 10*3/uL (ref 0.1–1.0)
Monocytes Relative: 7 %
Neutro Abs: 4.9 10*3/uL (ref 1.7–7.7)
Neutrophils Relative %: 66 %
Platelets: 220 10*3/uL (ref 150–400)
RBC: 4.68 MIL/uL (ref 4.22–5.81)
RDW: 12.9 % (ref 11.5–15.5)
WBC: 7.5 10*3/uL (ref 4.0–10.5)
nRBC: 0 % (ref 0.0–0.2)

## 2021-06-16 LAB — TSH: TSH: 5.544 u[IU]/mL — ABNORMAL HIGH (ref 0.350–4.500)

## 2021-06-16 MED ORDER — HEPARIN SOD (PORK) LOCK FLUSH 100 UNIT/ML IV SOLN
500.0000 [IU] | Freq: Once | INTRAVENOUS | Status: AC | PRN
  Filled 2021-06-16: qty 5

## 2021-06-16 MED ORDER — SODIUM CHLORIDE 0.9 % IV SOLN
Freq: Once | INTRAVENOUS | Status: AC
  Filled 2021-06-16: qty 250

## 2021-06-16 MED ORDER — SODIUM CHLORIDE 0.9 % IV SOLN
10.0000 mg/kg | Freq: Once | INTRAVENOUS | Status: AC
  Administered 2021-06-16: 860 mg via INTRAVENOUS
  Filled 2021-06-16: qty 10

## 2021-06-16 MED ORDER — HEPARIN SOD (PORK) LOCK FLUSH 100 UNIT/ML IV SOLN
INTRAVENOUS | Status: AC
  Administered 2021-06-16: 500 [IU]
  Filled 2021-06-16: qty 5

## 2021-06-16 NOTE — Progress Notes (Signed)
Hematology/Oncology Consult note [facility]  Telephone:([phone] [phone]  Patient Care Team: Perrin Maltese, MD as PCP - General (Internal Medicine) Telford Nab, RN as Oncology Nurse Navigator Sindy Guadeloupe, MD as Consulting Physician (Hematology and Oncology)   Name of the patient: Martin Jennings  [id number]  [birth year]   Date of visit: 06/16/21  Diagnosis- stage IIIc adenocarcinoma of the lung cT3 cN3 M0  Chief complaint/ Reason for visit-on treatment assessment prior to cycle 23 of maintenance durvalumab  Heme/Onc history: Patient is a 70 year old male with a history of tobacco dependence who was admitted to the hospital for symptoms of acute onset shortness of breath and was found to have Covid pneumonia.  He underwent CT angio chest which showed extensive mediastinal as well as bilateral hilar adenopathy.  Masslike opacity with architectural distortion involving the right upper lobe measuring 5.7 x 2.9 x 2.3 cm and groundglass opacities bilaterally.  Mass involving the left adrenal gland as well as a smaller right adrenal gland possibly indicating adrenal metastases.     This was followed by a PET CT scan 2 weeks later which showed the consolidative process in the right upper lobe suv 7.88 concerning for infiltrating tumor.  It appears to involve the pleura associated interstitial thickening was also seen.  Bulky right hilar and mediastinal adenopathy.  Enlarged somewhat necrotic appearing left supraclavicular lymph node was also hypermetabolic.  Adrenal gland was not hypermetabolic and was consistent with a benign adenoma.  No metastatic disease involving liver.  No features of distant metastatic disease.   Pathology showed metastatic non-small cell lung cancer which was TTF-1 positive consistent with adenocarcinoma   Patient finished concurrent chemoradiation with weekly CarboTaxol.  Scan showed partial response and patient is currently on  maintenance durvalumab which was started on 07/29/2020    Interval history-patient is tolerating treatment well so far.  Denies any specific complaints at this time.  He has chronic hip pain which has remained stable.  ECOG PS- 1 Pain scale- 0  Review of systems- Review of Systems  Constitutional:  Negative for chills, fever, malaise/fatigue and weight loss.  HENT:  Negative for congestion, ear discharge and nosebleeds.   Eyes:  Negative for blurred vision.  Respiratory:  Negative for cough, hemoptysis, sputum production, shortness of breath and wheezing.   Cardiovascular:  Negative for chest pain, palpitations, orthopnea and claudication.  Gastrointestinal:  Negative for abdominal pain, blood in stool, constipation, diarrhea, heartburn, melena, nausea and vomiting.  Genitourinary:  Negative for dysuria, flank pain, frequency, hematuria and urgency.  Musculoskeletal:  Negative for back pain, joint pain and myalgias.  Skin:  Negative for rash.  Neurological:  Negative for dizziness, tingling, focal weakness, seizures, weakness and headaches.  Endo/Heme/Allergies:  Does not bruise/bleed easily.  Psychiatric/Behavioral:  Negative for depression and suicidal ideas. The patient does not have insomnia.      No Known Allergies   Past Medical History:  Diagnosis Date   Bladder cancer ([place]) 2012   COVID-19 virus infection 04/03/2020   Hypertension    Lung cancer ([place])    Peripheral vascular disease ([place])      Past Surgical History:  Procedure Laterality Date   BLADDER REMOVAL     LOWER EXTREMITY ANGIOGRAPHY Left 04/12/2017   Procedure: Lower Extremity Angiography;  Surgeon: Katha Cabal, MD;  Location: [place];  Service: Cardiovascular;  Laterality: Left;   PORTA CATH INSERTION N/A 05/22/2020   Procedure: PORTA CATH INSERTION;  Surgeon: Algernon Huxley, MD;  Location: [place];  Service: Cardiovascular;  Laterality: N/A;   uretostomy     VASCULAR SURGERY       Social History   Socioeconomic History   Marital status: Married    Spouse name: Not on file   Number of children: Not on file   Years of education: Not on file   Highest education level: Not on file  Occupational History   Not on file  Tobacco Use   Smoking status: Former    Packs/day: 1.25    Years: 50.00    Pack years: 62.50    Types: Cigarettes    Quit date: 03/13/2020    Years since quitting: 1.2   Smokeless tobacco: Never  Vaping Use   Vaping Use: Never used  Substance and Sexual Activity   Alcohol use: No   Drug use: Not Currently    Types: Marijuana   Sexual activity: Not Currently  Other Topics Concern   Not on file  Social History Narrative   Not on file   Social Determinants of Health   Financial Resource Strain: Not on file  Food Insecurity: Not on file  Transportation Needs: Not on file  Physical Activity: Not on file  Stress: Not on file  Social Connections: Not on file  Intimate Partner Violence: Not on file    History reviewed. No pertinent family history.   Current Outpatient Medications:    amLODipine (NORVASC) 10 MG tablet, Take 10 mg by mouth daily. , Disp: , Rfl:    atorvastatin (LIPITOR) 20 MG tablet, Take 20 mg by mouth every evening. , Disp: , Rfl:    carvedilol (COREG) 6.25 MG tablet, Take 6.25 mg by mouth 2 (two) times daily., Disp: , Rfl:    clopidogrel (PLAVIX) 75 MG tablet, Take 75 mg by mouth daily. , Disp: , Rfl:    lidocaine-prilocaine (EMLA) cream, Apply 1 application topically as needed., Disp: 30 g, Rfl: 2   oxyCODONE (OXY IR/ROXICODONE) 5 MG immediate release tablet, Take 1 tablet (5 mg total) by mouth every 6 (six) hours as needed for severe pain., Disp: 45 tablet, Rfl: 0 No current facility-administered medications for this visit.  Facility-Administered Medications Ordered in Other Visits:    [COMPLETED] heparin lock flush 100 unit/mL, 500 Units, Intracatheter, Once PRN, Sindy Guadeloupe, MD, 500 Units at 06/16/21 1230    sodium chloride flush (NS) 0.9 % injection 10 mL, 10 mL, Intravenous, PRN, Sindy Guadeloupe, MD, 10 mL at 09/18/20 0850  Physical exam:  Vitals:   06/16/21 0932  BP: 134/74  Pulse: 81  Resp: 18  Temp: 97.8 F (36.6 C)  SpO2: 98%  Weight: 190 lb 3.2 oz (86.3 kg)   Physical Exam Constitutional:      General: He is not in acute distress. Cardiovascular:     Rate and Rhythm: Normal rate and regular rhythm.     Heart sounds: Normal heart sounds.  Pulmonary:     Effort: Pulmonary effort is normal.     Breath sounds: Normal breath sounds.  Abdominal:     General: Bowel sounds are normal.     Palpations: Abdomen is soft.  Skin:    General: Skin is warm and dry.  Neurological:     Mental Status: He is alert and oriented to person, place, and time.     CMP Latest Ref Rng & Units 06/16/2021  Glucose 70 - 99 mg/dL 113(H)  BUN 8 - 23 mg/dL  33(H)  Creatinine 0.61 - 1.24 mg/dL 1.91(H)  Sodium 135 - 145 mmol/L 138  Potassium 3.5 - 5.1 mmol/L 4.0  Chloride 98 - 111 mmol/L 107  CO2 22 - 32 mmol/L 24  Calcium 8.9 - 10.3 mg/dL 9.1  Total Protein 6.5 - 8.1 g/dL 7.5  Total Bilirubin 0.3 - 1.2 mg/dL 0.2(L)  Alkaline Phos 38 - 126 U/L 68  AST 15 - 41 U/L 17  ALT 0 - 44 U/L 17   CBC Latest Ref Rng & Units 06/16/2021  WBC 4.0 - 10.5 K/uL 7.5  Hemoglobin 13.0 - 17.0 g/dL 14.4  Hematocrit 39.0 - 52.0 % 44.0  Platelets 150 - 400 K/uL 220    No images are attached to the encounter.  CT CHEST ABDOMEN PELVIS WO CONTRAST  Result Date: 06/12/2021 CLINICAL DATA:  Lung cancer restaging. EXAM: CT CHEST, ABDOMEN AND PELVIS WITHOUT CONTRAST TECHNIQUE: Multidetector CT imaging of the chest, abdomen and pelvis was performed following the standard protocol without IV contrast. COMPARISON:  Noncontrast exam 02/17/2021, CT with IV contrast 11/03/2020. FINDINGS: CT CHEST FINDINGS Cardiovascular: There is aortic and three-vessel coronary artery calcific arteriosclerosis. The pulmonary trunk is 3.4 cm  indicating arterial hypertension. No pericardial effusion is seen. Patchy calcification of the great vessels is present. Right IJ port catheter terminates in the SVC. Mediastinum/Nodes: Index pretracheal lymph node is stable in size at 1.0 x 0.9 cm on series 2 axial 21. An index subcarinal lymph node also is stable measuring 1.8 x 1.0 cm on axial 30. No other adenopathy is visible without contrast but their are noted scattered borderline sized bilateral hilar lymph nodes which are stable in the interval as well. Small hiatal hernia. Thyroid gland, trachea and main bronchi are unremarkable. There are post XRT changes about the right hilum with a stable appearance since the most recent exam. Lungs/Pleura: Minimal right pleural effusion is present improved over both of the prior studies. Right upper lobe apical consolidative bands are again noted and slightly less prominent than previously. Centrilobular emphysematous disease is unchanged as well as subpleural fibrosis with a lower zonal predominance. There is no acute infiltrate or new mass. Evidence of lower zonal subpleural bronchiolectasis was better seen on the last CT due to technical factors. Musculoskeletal: No regional destructive bone lesions or chest wall mass. CT ABDOMEN PELVIS FINDINGS Hepatobiliary: Subcentimeter hypodensity in the dome of the left lobe is unchanged and probably a small cyst. No other focal abnormality without contrast is seen in the liver. The gallbladder and bile ducts are unremarkable save for a 1 cm stone in the proximal gallbladder, which was previously in the fundus. Pancreas: No focal abnormality without contrast. Spleen: No focal abnormality without contrast. Adrenals/Urinary Tract: Asymmetric left renal cortical volume loss. There is a stable 2.4 x 1.6 cm left adrenal adenoma and stable 1.5 cm right adrenal adenoma. And both measure less than 5 Hounsfield units. No focal abnormality of the unenhanced renal cortex is seen. No  stones or hydronephrosis. Cystoprostatectomy changes are again shown with right lower quadrant ureteroileal conduit Stomach/Bowel: No dilatation or wall thickening including the appendix. Left colonic diverticula without findings of acute colitis or diverticulitis. Vascular/Lymphatic: Heavy aortoiliac calcific disease. Left renal artery stent. Bilateral common iliac and external iliac artery stents are again seen with fem-fem bypass graft and old scarring in the groin areas. No abdominal or pelvic adenopathy is seen. Reproductive: Cystoprostatectomy. Other: Chronic umbilical fat hernia. No free air, hemorrhage or fluid. Musculoskeletal: No appreciable destructive bone lesions. IMPRESSION:  1. Essentially stable posttreatment appearance of the right upper lobe/perihilar area. Bands of consolidation in the right apex are slightly less prominent than previously and the volume of right pleural fluid is now minimal. No new or increased adenopathy is seen. Scattered borderline hilar lymph nodes and mediastinal nodes described above are unaltered. 2. No noncontrast evidence of metastatic disease in the abdomen and pelvis. Chronic findings described above. Electronically Signed   By: Telford Nab M.D.   On: 06/12/2021 04:40     Assessment and plan- Patient is a 70 y.o. male with stage IIIc adenocarcinoma of the right upper lobe of the lung cT4 N3 M0.  He is here for on treatment assessment prior to cycle 23 of maintenance durvalumab  Tonsillar proceed with cycle 23 of maintenance durvalumab today and I will see him back in 2 weeks for cycle 24.I have reviewed CT chest abdomen and pelvis without contrast images independently and discussed findings with the patient which shows stable posttreatment appearance of the right upper lobe area.  No evidence of mediastinal adenopathy or distant recurrence.  TSH is mildly elevated today at 5 which we will repeat again in 6 weeks   Visit Diagnosis 1. Encounter for  antineoplastic immunotherapy   2. Malignant neoplasm of upper lobe of left lung ([place])      Dr. Randa Evens, MD, MPH [facility] [phone] 06/16/2021 12:25 PM

## 2021-06-16 NOTE — Patient Instructions (Signed)
[facility]   Discharge Instructions: Thank you for choosing [facility] to provide your oncology and hematology care.  If you have a lab appointment with the [facility], please go directly to the [facility] and check in at the registration area.  Wear comfortable clothing and clothing appropriate for easy access to any Portacath or PICC line.   We strive to give you quality time with your provider. You may need to reschedule your appointment if you arrive late (15 or more minutes).  Arriving late affects you and other patients whose appointments are after yours.  Also, if you miss three or more appointments without notifying the office, you may be dismissed from the clinic at the provider's discretion.      For prescription refill requests, have your pharmacy contact our office and allow 72 hours for refills to be completed.    Today you received the following chemotherapy and/or immunotherapy agents: Imfinzi.      To help prevent nausea and vomiting after your treatment, we encourage you to take your nausea medication as directed.  BELOW ARE SYMPTOMS THAT SHOULD BE REPORTED IMMEDIATELY: *FEVER GREATER THAN 100.4 F (38 C) OR HIGHER *CHILLS OR SWEATING *NAUSEA AND VOMITING THAT IS NOT CONTROLLED WITH YOUR NAUSEA MEDICATION *UNUSUAL SHORTNESS OF BREATH *UNUSUAL BRUISING OR BLEEDING *URINARY PROBLEMS (pain or burning when urinating, or frequent urination) *BOWEL PROBLEMS (unusual diarrhea, constipation, pain near the anus) TENDERNESS IN MOUTH AND THROAT WITH OR WITHOUT PRESENCE OF ULCERS (sore throat, sores in mouth, or a toothache) UNUSUAL RASH, SWELLING OR PAIN  UNUSUAL VAGINAL DISCHARGE OR ITCHING   Items with * indicate a potential emergency and should be followed up as soon as possible or go to the Emergency Department if any problems should occur.  Please show the CHEMOTHERAPY ALERT CARD or IMMUNOTHERAPY ALERT CARD at check-in  to the Emergency Department and triage nurse.  Should you have questions after your visit or need to cancel or reschedule your appointment, please contact [facility]  [phone] and follow the prompts.  Office hours are 8:00 a.m. to 4:30 p.m. Monday - Friday. Please note that voicemails left after 4:00 p.m. may not be returned until the following business day.  We are closed weekends and major holidays. You have access to a nurse at all times for urgent questions. Please call the main number to the clinic [phone] and follow the prompts.  For any non-urgent questions, you may also contact your provider using MyChart. We now offer e-Visits for anyone 53 and older to request care online for non-urgent symptoms. For details visit [email or web address].   Also download the MyChart app! Go to the app store, search "MyChart", open the app, select [facility], and log in with your MyChart username and password.  Due to Covid, a mask is required upon entering the hospital/clinic. If you do not have a mask, one will be given to you upon arrival. For doctor visits, patients may have 1 support person aged 53 or older with them. For treatment visits, patients cannot have anyone with them due to current Covid guidelines and our immunocompromised population.

## 2021-06-16 NOTE — Progress Notes (Signed)
Pt does not have any new concerns for todays visit.

## 2021-06-16 NOTE — Progress Notes (Signed)
1035- Creatinine: 1.91. MD, Dr. Janese Banks, notified and aware. Per MD order: okay to proceed with scheduled Imfinzi treatment today.

## 2021-06-23 ENCOUNTER — Other Ambulatory Visit: Payer: Self-pay | Admitting: Oncology

## 2021-06-23 DIAGNOSIS — I10 Essential (primary) hypertension: Secondary | ICD-10-CM | POA: Diagnosis not present

## 2021-06-23 DIAGNOSIS — N189 Chronic kidney disease, unspecified: Secondary | ICD-10-CM | POA: Diagnosis not present

## 2021-06-23 DIAGNOSIS — R7303 Prediabetes: Secondary | ICD-10-CM | POA: Diagnosis not present

## 2021-06-23 DIAGNOSIS — E782 Mixed hyperlipidemia: Secondary | ICD-10-CM | POA: Diagnosis not present

## 2021-06-23 DIAGNOSIS — M109 Gout, unspecified: Secondary | ICD-10-CM | POA: Diagnosis not present

## 2021-06-30 ENCOUNTER — Encounter: Payer: Self-pay | Admitting: Oncology

## 2021-06-30 ENCOUNTER — Inpatient Hospital Stay (HOSPITAL_BASED_OUTPATIENT_CLINIC_OR_DEPARTMENT_OTHER): Payer: Medicare Other | Admitting: Oncology

## 2021-06-30 ENCOUNTER — Inpatient Hospital Stay: Payer: Medicare Other | Attending: Oncology

## 2021-06-30 ENCOUNTER — Inpatient Hospital Stay: Payer: Medicare Other

## 2021-06-30 ENCOUNTER — Other Ambulatory Visit: Payer: Self-pay

## 2021-06-30 VITALS — BP 148/73 | HR 73 | Temp 98.3°F | Resp 16 | Ht 71.0 in | Wt 191.9 lb

## 2021-06-30 DIAGNOSIS — Z5112 Encounter for antineoplastic immunotherapy: Secondary | ICD-10-CM

## 2021-06-30 DIAGNOSIS — F17210 Nicotine dependence, cigarettes, uncomplicated: Secondary | ICD-10-CM | POA: Diagnosis not present

## 2021-06-30 DIAGNOSIS — Z79899 Other long term (current) drug therapy: Secondary | ICD-10-CM | POA: Diagnosis not present

## 2021-06-30 DIAGNOSIS — C3411 Malignant neoplasm of upper lobe, right bronchus or lung: Secondary | ICD-10-CM | POA: Diagnosis not present

## 2021-06-30 DIAGNOSIS — C3401 Malignant neoplasm of right main bronchus: Secondary | ICD-10-CM

## 2021-06-30 LAB — COMPREHENSIVE METABOLIC PANEL
ALT: 12 U/L (ref 0–44)
AST: 17 U/L (ref 15–41)
Albumin: 3.5 g/dL (ref 3.5–5.0)
Alkaline Phosphatase: 62 U/L (ref 38–126)
Anion gap: 12 (ref 5–15)
BUN: 20 mg/dL (ref 8–23)
CO2: 24 mmol/L (ref 22–32)
Calcium: 8.9 mg/dL (ref 8.9–10.3)
Chloride: 103 mmol/L (ref 98–111)
Creatinine, Ser: 1.72 mg/dL — ABNORMAL HIGH (ref 0.61–1.24)
GFR, Estimated: 42 mL/min — ABNORMAL LOW (ref >60)
Glucose, Bld: 112 mg/dL — ABNORMAL HIGH (ref 70–99)
Potassium: 3.3 mmol/L — ABNORMAL LOW (ref 3.5–5.1)
Sodium: 139 mmol/L (ref 135–145)
Total Bilirubin: 0.4 mg/dL (ref 0.3–1.2)
Total Protein: 7.5 g/dL (ref 6.5–8.1)

## 2021-06-30 LAB — CBC WITH DIFFERENTIAL/PLATELET
Abs Immature Granulocytes: 0.02 10*3/uL (ref 0.00–0.07)
Basophils Absolute: 0 10*3/uL (ref 0.0–0.1)
Basophils Relative: 1 %
Eosinophils Absolute: 0.6 10*3/uL — ABNORMAL HIGH (ref 0.0–0.5)
Eosinophils Relative: 9 %
HCT: 43.1 % (ref 39.0–52.0)
Hemoglobin: 14.3 g/dL (ref 13.0–17.0)
Immature Granulocytes: 0 %
Lymphocytes Relative: 16 %
Lymphs Abs: 1.1 10*3/uL (ref 0.7–4.0)
MCH: 31.1 pg (ref 26.0–34.0)
MCHC: 33.2 g/dL (ref 30.0–36.0)
MCV: 93.7 fL (ref 80.0–100.0)
Monocytes Absolute: 0.5 10*3/uL (ref 0.1–1.0)
Monocytes Relative: 8 %
Neutro Abs: 4.3 10*3/uL (ref 1.7–7.7)
Neutrophils Relative %: 66 %
Platelets: 194 10*3/uL (ref 150–400)
RBC: 4.6 MIL/uL (ref 4.22–5.81)
RDW: 12.8 % (ref 11.5–15.5)
WBC: 6.6 10*3/uL (ref 4.0–10.5)
nRBC: 0 % (ref 0.0–0.2)

## 2021-06-30 MED ORDER — HEPARIN SOD (PORK) LOCK FLUSH 100 UNIT/ML IV SOLN
500.0000 [IU] | Freq: Once | INTRAVENOUS | Status: DC
  Filled 2021-06-30: qty 5

## 2021-06-30 MED ORDER — HEPARIN SOD (PORK) LOCK FLUSH 100 UNIT/ML IV SOLN
INTRAVENOUS | Status: AC
  Administered 2021-06-30: 500 [IU]
  Filled 2021-06-30: qty 5

## 2021-06-30 MED ORDER — SODIUM CHLORIDE 0.9 % IV SOLN
Freq: Once | INTRAVENOUS | Status: AC
  Filled 2021-06-30: qty 250

## 2021-06-30 MED ORDER — SODIUM CHLORIDE 0.9 % IV SOLN
10.0000 mg/kg | Freq: Once | INTRAVENOUS | Status: AC
  Administered 2021-06-30: 860 mg via INTRAVENOUS
  Filled 2021-06-30: qty 10

## 2021-06-30 MED ORDER — SODIUM CHLORIDE 0.9% FLUSH
10.0000 mL | INTRAVENOUS | Status: DC | PRN
  Administered 2021-06-30: 10 mL via INTRAVENOUS
  Filled 2021-06-30: qty 10

## 2021-06-30 MED ORDER — HEPARIN SOD (PORK) LOCK FLUSH 100 UNIT/ML IV SOLN
500.0000 [IU] | Freq: Once | INTRAVENOUS | Status: AC | PRN
  Filled 2021-06-30: qty 5

## 2021-06-30 NOTE — Patient Instructions (Signed)
[facility] [place]  Discharge Instructions: Thank you for choosing [facility] to provide your oncology and hematology care.  If you have a lab appointment with the [facility], please go directly to the [facility] and check in at the registration area.  Wear comfortable clothing and clothing appropriate for easy access to any Portacath or PICC line.   We strive to give you quality time with your provider. You may need to reschedule your appointment if you arrive late (15 or more minutes).  Arriving late affects you and other patients whose appointments are after yours.  Also, if you miss three or more appointments without notifying the office, you may be dismissed from the clinic at the provider's discretion.      For prescription refill requests, have your pharmacy contact our office and allow 72 hours for refills to be completed.    Today you received the following chemotherapy and/or immunotherapy agents: Imfinzi      To help prevent nausea and vomiting after your treatment, we encourage you to take your nausea medication as directed.  BELOW ARE SYMPTOMS THAT SHOULD BE REPORTED IMMEDIATELY: *FEVER GREATER THAN 100.4 F (38 C) OR HIGHER *CHILLS OR SWEATING *NAUSEA AND VOMITING THAT IS NOT CONTROLLED WITH YOUR NAUSEA MEDICATION *UNUSUAL SHORTNESS OF BREATH *UNUSUAL BRUISING OR BLEEDING *URINARY PROBLEMS (pain or burning when urinating, or frequent urination) *BOWEL PROBLEMS (unusual diarrhea, constipation, pain near the anus) TENDERNESS IN MOUTH AND THROAT WITH OR WITHOUT PRESENCE OF ULCERS (sore throat, sores in mouth, or a toothache) UNUSUAL RASH, SWELLING OR PAIN  UNUSUAL VAGINAL DISCHARGE OR ITCHING   Items with * indicate a potential emergency and should be followed up as soon as possible or go to the Emergency Department if any problems should occur.  Please show the CHEMOTHERAPY ALERT CARD or IMMUNOTHERAPY ALERT CARD at check-in to  the Emergency Department and triage nurse.  Should you have questions after your visit or need to cancel or reschedule your appointment, please contact [facility] [place] AT [place]  [phone] and follow the prompts.  Office hours are 8:00 a.m. to 4:30 p.m. Monday - Friday. Please note that voicemails left after 4:00 p.m. may not be returned until the following business day.  We are closed weekends and major holidays. You have access to a nurse at all times for urgent questions. Please call the main number to the clinic [phone] and follow the prompts.  For any non-urgent questions, you may also contact your provider using MyChart. We now offer e-Visits for anyone 82 and older to request care online for non-urgent symptoms. For details visit [email or web address].   Also download the MyChart app! Go to the app store, search "MyChart", open the app, select [facility] [place], and log in with your MyChart username and password.  Due to Covid, a mask is required upon entering the hospital/clinic. If you do not have a mask, one will be given to you upon arrival. For doctor visits, patients may have 1 support person aged 65 or older with them. For treatment visits, patients cannot have anyone with them due to current Covid guidelines and our immunocompromised population.

## 2021-06-30 NOTE — Progress Notes (Signed)
Hematology/Oncology Consult note [facility]  Telephone:([phone] [phone]  Patient Care Team: Perrin Maltese, MD as PCP - General (Internal Medicine) Telford Nab, RN as Oncology Nurse Navigator Sindy Guadeloupe, MD as Consulting Physician (Hematology and Oncology)   Name of the patient: Martin Jennings  [id number]  [birth year]   Date of visit: 06/30/21  Diagnosis- stage IIIc adenocarcinoma of the lung cT3 cN3 M0  Chief complaint/ Reason for visit-on treatment assessment prior to cycle 24 of maintenance durvalumab  Heme/Onc history: Patient is a 70 year old male with a history of tobacco dependence who was admitted to the hospital for symptoms of acute onset shortness of breath and was found to have Covid pneumonia.  He underwent CT angio chest which showed extensive mediastinal as well as bilateral hilar adenopathy.  Masslike opacity with architectural distortion involving the right upper lobe measuring 5.7 x 2.9 x 2.3 cm and groundglass opacities bilaterally.  Mass involving the left adrenal gland as well as a smaller right adrenal gland possibly indicating adrenal metastases.     This was followed by a PET CT scan 2 weeks later which showed the consolidative process in the right upper lobe suv 7.88 concerning for infiltrating tumor.  It appears to involve the pleura associated interstitial thickening was also seen.  Bulky right hilar and mediastinal adenopathy.  Enlarged somewhat necrotic appearing left supraclavicular lymph node was also hypermetabolic.  Adrenal gland was not hypermetabolic and was consistent with a benign adenoma.  No metastatic disease involving liver.  No features of distant metastatic disease.   Pathology showed metastatic non-small cell lung cancer which was TTF-1 positive consistent with adenocarcinoma   Patient finished concurrent chemoradiation with weekly CarboTaxol.  Scan showed partial response and patient is currently on  maintenance durvalumab which was started on 07/29/2020    Interval history-patient is tolerating treatment well without any significant side effects.  Denies any new complaints at this time  ECOG PS- 1 Pain scale- 0 Opioid associated constipation- no  Review of systems- Review of Systems  Constitutional:  Negative for chills, fever, malaise/fatigue and weight loss.  HENT:  Negative for congestion, ear discharge and nosebleeds.   Eyes:  Negative for blurred vision.  Respiratory:  Negative for cough, hemoptysis, sputum production, shortness of breath and wheezing.   Cardiovascular:  Negative for chest pain, palpitations, orthopnea and claudication.  Gastrointestinal:  Negative for abdominal pain, blood in stool, constipation, diarrhea, heartburn, melena, nausea and vomiting.  Genitourinary:  Negative for dysuria, flank pain, frequency, hematuria and urgency.  Musculoskeletal:  Negative for back pain, joint pain and myalgias.  Skin:  Negative for rash.  Neurological:  Negative for dizziness, tingling, focal weakness, seizures, weakness and headaches.  Endo/Heme/Allergies:  Does not bruise/bleed easily.  Psychiatric/Behavioral:  Negative for depression and suicidal ideas. The patient does not have insomnia.      No Known Allergies   Past Medical History:  Diagnosis Date   Bladder cancer ([place]) 2012   COVID-19 virus infection 04/03/2020   Hypertension    Lung cancer ([place])    Peripheral vascular disease ([place])      Past Surgical History:  Procedure Laterality Date   BLADDER REMOVAL     LOWER EXTREMITY ANGIOGRAPHY Left 04/12/2017   Procedure: Lower Extremity Angiography;  Surgeon: Katha Cabal, MD;  Location: [place] [facility];  Service: Cardiovascular;  Laterality: Left;   PORTA CATH INSERTION N/A 05/22/2020   Procedure: PORTA CATH INSERTION;  Surgeon: Leotis Pain  S, MD;  Location: [place] [facility];  Service: Cardiovascular;  Laterality: N/A;   uretostomy     VASCULAR  SURGERY      Social History   Socioeconomic History   Marital status: Married    Spouse name: Not on file   Number of children: Not on file   Years of education: Not on file   Highest education level: Not on file  Occupational History   Not on file  Tobacco Use   Smoking status: Some Days    Packs/day: 1.25    Years: 50.00    Pack years: 62.50    Types: Cigarettes   Smokeless tobacco: Never   Tobacco comments:    Per pt. He smokes 1 pack every 3 days  Vaping Use   Vaping Use: Never used  Substance and Sexual Activity   Alcohol use: No   Drug use: Not Currently    Types: Marijuana   Sexual activity: Not Currently  Other Topics Concern   Not on file  Social History Narrative   Not on file   Social Determinants of Health   Financial Resource Strain: Not on file  Food Insecurity: Not on file  Transportation Needs: Not on file  Physical Activity: Not on file  Stress: Not on file  Social Connections: Not on file  Intimate Partner Violence: Not on file    History reviewed. No pertinent family history.   Current Outpatient Medications:    amLODipine (NORVASC) 10 MG tablet, Take 10 mg by mouth daily. , Disp: , Rfl:    atorvastatin (LIPITOR) 20 MG tablet, Take 20 mg by mouth every evening. , Disp: , Rfl:    clopidogrel (PLAVIX) 75 MG tablet, Take 75 mg by mouth daily. , Disp: , Rfl:    lidocaine-prilocaine (EMLA) cream, Apply 1 application topically as needed., Disp: 30 g, Rfl: 2   oxyCODONE (OXY IR/ROXICODONE) 5 MG immediate release tablet, TAKE 1 TABLET BY MOUTH EVERY 6 HOURS AS NEEDED FOR SEVERE PAIN, Disp: 45 tablet, Rfl: 0 No current facility-administered medications for this visit.  Facility-Administered Medications Ordered in Other Visits:    sodium chloride flush (NS) 0.9 % injection 10 mL, 10 mL, Intravenous, PRN, Sindy Guadeloupe, MD, 10 mL at 09/18/20 0850  Physical exam:  Vitals:   06/30/21 0941  BP: (!) 148/73  Pulse: 73  Resp: 16  Temp: 98.3 F (36.8  C)  TempSrc: Tympanic  Weight: 191 lb 14.4 oz (87 kg)  Height: 5\' 11"  (1.803 m)   Physical Exam Constitutional:      General: He is not in acute distress. Cardiovascular:     Rate and Rhythm: Normal rate and regular rhythm.     Heart sounds: Normal heart sounds.  Pulmonary:     Effort: Pulmonary effort is normal.     Breath sounds: Normal breath sounds.  Abdominal:     General: Bowel sounds are normal.     Palpations: Abdomen is soft.  Skin:    General: Skin is warm and dry.  Neurological:     Mental Status: He is alert and oriented to person, place, and time.     CMP Latest Ref Rng & Units 06/30/2021  Glucose 70 - 99 mg/dL 112(H)  BUN 8 - 23 mg/dL 20  Creatinine 0.61 - 1.24 mg/dL 1.72(H)  Sodium 135 - 145 mmol/L 139  Potassium 3.5 - 5.1 mmol/L 3.3(L)  Chloride 98 - 111 mmol/L 103  CO2 22 - 32 mmol/L 24  Calcium  8.9 - 10.3 mg/dL 8.9  Total Protein 6.5 - 8.1 g/dL 7.5  Total Bilirubin 0.3 - 1.2 mg/dL 0.4  Alkaline Phos 38 - 126 U/L 62  AST 15 - 41 U/L 17  ALT 0 - 44 U/L 12   CBC Latest Ref Rng & Units 06/30/2021  WBC 4.0 - 10.5 K/uL 6.6  Hemoglobin 13.0 - 17.0 g/dL 14.3  Hematocrit 39.0 - 52.0 % 43.1  Platelets 150 - 400 K/uL 194    No images are attached to the encounter.  CT CHEST ABDOMEN PELVIS WO CONTRAST  Result Date: 06/12/2021 CLINICAL DATA:  Lung cancer restaging. EXAM: CT CHEST, ABDOMEN AND PELVIS WITHOUT CONTRAST TECHNIQUE: Multidetector CT imaging of the chest, abdomen and pelvis was performed following the standard protocol without IV contrast. COMPARISON:  Noncontrast exam 02/17/2021, CT with IV contrast 11/03/2020. FINDINGS: CT CHEST FINDINGS Cardiovascular: There is aortic and three-vessel coronary artery calcific arteriosclerosis. The pulmonary trunk is 3.4 cm indicating arterial hypertension. No pericardial effusion is seen. Patchy calcification of the great vessels is present. Right IJ port catheter terminates in the SVC. Mediastinum/Nodes: Index  pretracheal lymph node is stable in size at 1.0 x 0.9 cm on series 2 axial 21. An index subcarinal lymph node also is stable measuring 1.8 x 1.0 cm on axial 30. No other adenopathy is visible without contrast but their are noted scattered borderline sized bilateral hilar lymph nodes which are stable in the interval as well. Small hiatal hernia. Thyroid gland, trachea and main bronchi are unremarkable. There are post XRT changes about the right hilum with a stable appearance since the most recent exam. Lungs/Pleura: Minimal right pleural effusion is present improved over both of the prior studies. Right upper lobe apical consolidative bands are again noted and slightly less prominent than previously. Centrilobular emphysematous disease is unchanged as well as subpleural fibrosis with a lower zonal predominance. There is no acute infiltrate or new mass. Evidence of lower zonal subpleural bronchiolectasis was better seen on the last CT due to technical factors. Musculoskeletal: No regional destructive bone lesions or chest wall mass. CT ABDOMEN PELVIS FINDINGS Hepatobiliary: Subcentimeter hypodensity in the dome of the left lobe is unchanged and probably a small cyst. No other focal abnormality without contrast is seen in the liver. The gallbladder and bile ducts are unremarkable save for a 1 cm stone in the proximal gallbladder, which was previously in the fundus. Pancreas: No focal abnormality without contrast. Spleen: No focal abnormality without contrast. Adrenals/Urinary Tract: Asymmetric left renal cortical volume loss. There is a stable 2.4 x 1.6 cm left adrenal adenoma and stable 1.5 cm right adrenal adenoma. And both measure less than 5 Hounsfield units. No focal abnormality of the unenhanced renal cortex is seen. No stones or hydronephrosis. Cystoprostatectomy changes are again shown with right lower quadrant ureteroileal conduit Stomach/Bowel: No dilatation or wall thickening including the appendix. Left  colonic diverticula without findings of acute colitis or diverticulitis. Vascular/Lymphatic: Heavy aortoiliac calcific disease. Left renal artery stent. Bilateral common iliac and external iliac artery stents are again seen with fem-fem bypass graft and old scarring in the groin areas. No abdominal or pelvic adenopathy is seen. Reproductive: Cystoprostatectomy. Other: Chronic umbilical fat hernia. No free air, hemorrhage or fluid. Musculoskeletal: No appreciable destructive bone lesions. IMPRESSION: 1. Essentially stable posttreatment appearance of the right upper lobe/perihilar area. Bands of consolidation in the right apex are slightly less prominent than previously and the volume of right pleural fluid is now minimal. No new or increased  adenopathy is seen. Scattered borderline hilar lymph nodes and mediastinal nodes described above are unaltered. 2. No noncontrast evidence of metastatic disease in the abdomen and pelvis. Chronic findings described above. Electronically Signed   By: Telford Nab M.D.   On: 06/12/2021 04:40     Assessment and plan- Patient is a 70 y.o. male with stage IIIc adenocarcinoma of the right upper lobe of the lung cT4 N3 M0.  He is here for on treatment assessment prior to cycle 24 of maintenance durvalumab  Counts okay to proceed with cycle 24 of maintenance durvalumab today.  He will directly proceed for durvalumab in 2 weeks for cycle 25 and I will see him back in 4 weeks for cycle 26 which would be his last cycle.  Patient did have recent CT chest abdomen pelvis without contrast on 06/11/2021 which overall showedEvolving changes of radiation in the right upper lobe and no concerning evidence of recurrent disease.  After 26 cycles of maintenance durvalumab plan would be to monitor him off treatment with surveillance scans every 3 months.  CKD: Chronic and creatinine stable around 1.9.  Continue to monitor   Visit Diagnosis 1. Malignant neoplasm of upper lobe of right lung  ([place])   2. Encounter for antineoplastic immunotherapy      Dr. Randa Evens, MD, MPH [facility] [phone] 06/30/2021 1:09 PM

## 2021-06-30 NOTE — Progress Notes (Signed)
Pt says he is doing good, he does have gout on left big toe. He has started to smoke again. No other issues

## 2021-06-30 NOTE — Progress Notes (Signed)
Per Dr. Janese Banks, ok to treat with a creatinine of 1.9 today.

## 2021-07-14 ENCOUNTER — Other Ambulatory Visit: Payer: Self-pay

## 2021-07-14 ENCOUNTER — Inpatient Hospital Stay: Payer: Medicare Other

## 2021-07-14 VITALS — BP 148/76 | HR 83 | Temp 98.0°F | Resp 18 | Wt 192.4 lb

## 2021-07-14 DIAGNOSIS — C3411 Malignant neoplasm of upper lobe, right bronchus or lung: Secondary | ICD-10-CM | POA: Diagnosis not present

## 2021-07-14 DIAGNOSIS — Z5112 Encounter for antineoplastic immunotherapy: Secondary | ICD-10-CM | POA: Diagnosis not present

## 2021-07-14 DIAGNOSIS — N189 Chronic kidney disease, unspecified: Secondary | ICD-10-CM | POA: Diagnosis not present

## 2021-07-14 DIAGNOSIS — Z79899 Other long term (current) drug therapy: Secondary | ICD-10-CM | POA: Diagnosis not present

## 2021-07-14 DIAGNOSIS — F17210 Nicotine dependence, cigarettes, uncomplicated: Secondary | ICD-10-CM | POA: Diagnosis not present

## 2021-07-14 DIAGNOSIS — C3401 Malignant neoplasm of right main bronchus: Secondary | ICD-10-CM

## 2021-07-14 DIAGNOSIS — M109 Gout, unspecified: Secondary | ICD-10-CM | POA: Diagnosis not present

## 2021-07-14 LAB — COMPREHENSIVE METABOLIC PANEL
ALT: 11 U/L (ref 0–44)
AST: 20 U/L (ref 15–41)
Albumin: 3.5 g/dL (ref 3.5–5.0)
Alkaline Phosphatase: 57 U/L (ref 38–126)
Anion gap: 10 (ref 5–15)
BUN: 26 mg/dL — ABNORMAL HIGH (ref 8–23)
CO2: 24 mmol/L (ref 22–32)
Calcium: 8.4 mg/dL — ABNORMAL LOW (ref 8.9–10.3)
Chloride: 102 mmol/L (ref 98–111)
Creatinine, Ser: 1.9 mg/dL — ABNORMAL HIGH (ref 0.61–1.24)
GFR, Estimated: 37 mL/min — ABNORMAL LOW (ref >60)
Glucose, Bld: 237 mg/dL — ABNORMAL HIGH (ref 70–99)
Potassium: 3.3 mmol/L — ABNORMAL LOW (ref 3.5–5.1)
Sodium: 136 mmol/L (ref 135–145)
Total Bilirubin: 0.5 mg/dL (ref 0.3–1.2)
Total Protein: 7.1 g/dL (ref 6.5–8.1)

## 2021-07-14 LAB — CBC WITH DIFFERENTIAL/PLATELET
Abs Immature Granulocytes: 0.03 10*3/uL (ref 0.00–0.07)
Basophils Absolute: 0 10*3/uL (ref 0.0–0.1)
Basophils Relative: 0 %
Eosinophils Absolute: 0.5 10*3/uL (ref 0.0–0.5)
Eosinophils Relative: 7 %
HCT: 43.4 % (ref 39.0–52.0)
Hemoglobin: 14.1 g/dL (ref 13.0–17.0)
Immature Granulocytes: 0 %
Lymphocytes Relative: 13 %
Lymphs Abs: 0.9 10*3/uL (ref 0.7–4.0)
MCH: 30.9 pg (ref 26.0–34.0)
MCHC: 32.5 g/dL (ref 30.0–36.0)
MCV: 95 fL (ref 80.0–100.0)
Monocytes Absolute: 0.5 10*3/uL (ref 0.1–1.0)
Monocytes Relative: 7 %
Neutro Abs: 4.9 10*3/uL (ref 1.7–7.7)
Neutrophils Relative %: 73 %
Platelets: 194 10*3/uL (ref 150–400)
RBC: 4.57 MIL/uL (ref 4.22–5.81)
RDW: 12.9 % (ref 11.5–15.5)
WBC: 6.8 10*3/uL (ref 4.0–10.5)
nRBC: 0 % (ref 0.0–0.2)

## 2021-07-14 MED ORDER — HEPARIN SOD (PORK) LOCK FLUSH 100 UNIT/ML IV SOLN
500.0000 [IU] | Freq: Once | INTRAVENOUS | Status: AC
  Filled 2021-07-14: qty 5

## 2021-07-14 MED ORDER — HEPARIN SOD (PORK) LOCK FLUSH 100 UNIT/ML IV SOLN
500.0000 [IU] | Freq: Once | INTRAVENOUS | Status: DC | PRN
  Filled 2021-07-14: qty 5

## 2021-07-14 MED ORDER — SODIUM CHLORIDE 0.9 % IV SOLN
Freq: Once | INTRAVENOUS | Status: AC
  Filled 2021-07-14: qty 250

## 2021-07-14 MED ORDER — SODIUM CHLORIDE 0.9% FLUSH
10.0000 mL | INTRAVENOUS | Status: DC | PRN
  Administered 2021-07-14: 11:00:00 10 mL via INTRAVENOUS
  Filled 2021-07-14: qty 10

## 2021-07-14 MED ORDER — HEPARIN SOD (PORK) LOCK FLUSH 100 UNIT/ML IV SOLN
INTRAVENOUS | Status: AC
  Administered 2021-07-14: 14:00:00 500 [IU] via INTRAVENOUS
  Filled 2021-07-14: qty 5

## 2021-07-14 MED ORDER — SODIUM CHLORIDE 0.9 % IV SOLN
10.0000 mg/kg | Freq: Once | INTRAVENOUS | Status: AC
  Administered 2021-07-14: 12:00:00 860 mg via INTRAVENOUS
  Filled 2021-07-14: qty 10

## 2021-07-14 MED ORDER — HEPARIN SOD (PORK) LOCK FLUSH 100 UNIT/ML IV SOLN
INTRAVENOUS | Status: AC
  Filled 2021-07-14: qty 5

## 2021-07-14 NOTE — Progress Notes (Signed)
Per Faythe Casa NP 07/14/21 labs reviewed (creatinine 1.9) and okay to proceed with Imfinzi.

## 2021-07-14 NOTE — Patient Instructions (Signed)
[facility] AT [place]  Discharge Instructions: Thank you for choosing [facility] to provide your oncology and hematology care.  If you have a lab appointment with the [facility], please go directly to the [facility] and check in at the registration area.  Wear comfortable clothing and clothing appropriate for easy access to any Portacath or PICC line.   We strive to give you quality time with your provider. You may need to reschedule your appointment if you arrive late (15 or more minutes).  Arriving late affects you and other patients whose appointments are after yours.  Also, if you miss three or more appointments without notifying the office, you may be dismissed from the clinic at the providers discretion.      For prescription refill requests, have your pharmacy contact our office and allow 72 hours for refills to be completed.    Today you received the following chemotherapy and/or immunotherapy agents Imfinzi        To help prevent nausea and vomiting after your treatment, we encourage you to take your nausea medication as directed.  BELOW ARE SYMPTOMS THAT SHOULD BE REPORTED IMMEDIATELY: *FEVER GREATER THAN 100.4 F (38 C) OR HIGHER *CHILLS OR SWEATING *NAUSEA AND VOMITING THAT IS NOT CONTROLLED WITH YOUR NAUSEA MEDICATION *UNUSUAL SHORTNESS OF BREATH *UNUSUAL BRUISING OR BLEEDING *URINARY PROBLEMS (pain or burning when urinating, or frequent urination) *BOWEL PROBLEMS (unusual diarrhea, constipation, pain near the anus) TENDERNESS IN MOUTH AND THROAT WITH OR WITHOUT PRESENCE OF ULCERS (sore throat, sores in mouth, or a toothache) UNUSUAL RASH, SWELLING OR PAIN  UNUSUAL VAGINAL DISCHARGE OR ITCHING   Items with * indicate a potential emergency and should be followed up as soon as possible or go to the Emergency Department if any problems should occur.  Please show the CHEMOTHERAPY ALERT CARD or IMMUNOTHERAPY ALERT CARD at check-in to  the Emergency Department and triage nurse.  Should you have questions after your visit or need to cancel or reschedule your appointment, please contact [facility] [place] AT [place]  [phone] and follow the prompts.  Office hours are 8:00 a.m. to 4:30 p.m. Monday - Friday. Please note that voicemails left after 4:00 p.m. may not be returned until the following business day.  We are closed weekends and major holidays. You have access to a nurse at all times for urgent questions. Please call the main number to the clinic [phone] and follow the prompts.  For any non-urgent questions, you may also contact your provider using MyChart. We now offer e-Visits for anyone 15 and older to request care online for non-urgent symptoms. For details visit [email or web address].   Also download the MyChart app! Go to the app store, search "MyChart", open the app, select [facility], and log in with your MyChart username and password.  Due to Covid, a mask is required upon entering the hospital/clinic. If you do not have a mask, one will be given to you upon arrival. For doctor visits, patients may have 1 support person aged 47 or older with them. For treatment visits, patients cannot have anyone with them due to current Covid guidelines and our immunocompromised population.

## 2021-07-22 ENCOUNTER — Ambulatory Visit: Payer: Medicare Other | Admitting: Radiation Oncology

## 2021-07-28 ENCOUNTER — Inpatient Hospital Stay: Payer: Medicare Other | Attending: Oncology

## 2021-07-28 ENCOUNTER — Other Ambulatory Visit: Payer: Self-pay

## 2021-07-28 ENCOUNTER — Inpatient Hospital Stay: Payer: Medicare Other

## 2021-07-28 ENCOUNTER — Encounter: Payer: Self-pay | Admitting: Oncology

## 2021-07-28 ENCOUNTER — Inpatient Hospital Stay (HOSPITAL_BASED_OUTPATIENT_CLINIC_OR_DEPARTMENT_OTHER): Payer: Medicare Other | Admitting: Oncology

## 2021-07-28 VITALS — BP 130/74 | HR 80 | Temp 97.7°F | Resp 16 | Ht 71.0 in | Wt 193.5 lb

## 2021-07-28 DIAGNOSIS — F17210 Nicotine dependence, cigarettes, uncomplicated: Secondary | ICD-10-CM | POA: Insufficient documentation

## 2021-07-28 DIAGNOSIS — C3411 Malignant neoplasm of upper lobe, right bronchus or lung: Secondary | ICD-10-CM | POA: Insufficient documentation

## 2021-07-28 DIAGNOSIS — C3412 Malignant neoplasm of upper lobe, left bronchus or lung: Secondary | ICD-10-CM | POA: Diagnosis not present

## 2021-07-28 DIAGNOSIS — N189 Chronic kidney disease, unspecified: Secondary | ICD-10-CM | POA: Diagnosis not present

## 2021-07-28 DIAGNOSIS — Z79899 Other long term (current) drug therapy: Secondary | ICD-10-CM | POA: Diagnosis not present

## 2021-07-28 DIAGNOSIS — I129 Hypertensive chronic kidney disease with stage 1 through stage 4 chronic kidney disease, or unspecified chronic kidney disease: Secondary | ICD-10-CM | POA: Insufficient documentation

## 2021-07-28 DIAGNOSIS — Z5112 Encounter for antineoplastic immunotherapy: Secondary | ICD-10-CM | POA: Insufficient documentation

## 2021-07-28 DIAGNOSIS — C3401 Malignant neoplasm of right main bronchus: Secondary | ICD-10-CM

## 2021-07-28 LAB — CBC WITH DIFFERENTIAL/PLATELET
Abs Immature Granulocytes: 0.03 10*3/uL (ref 0.00–0.07)
Basophils Absolute: 0 10*3/uL (ref 0.0–0.1)
Basophils Relative: 0 %
Eosinophils Absolute: 0.5 10*3/uL (ref 0.0–0.5)
Eosinophils Relative: 8 %
HCT: 43.9 % (ref 39.0–52.0)
Hemoglobin: 14.3 g/dL (ref 13.0–17.0)
Immature Granulocytes: 0 %
Lymphocytes Relative: 14 %
Lymphs Abs: 1 10*3/uL (ref 0.7–4.0)
MCH: 30.8 pg (ref 26.0–34.0)
MCHC: 32.6 g/dL (ref 30.0–36.0)
MCV: 94.6 fL (ref 80.0–100.0)
Monocytes Absolute: 0.5 10*3/uL (ref 0.1–1.0)
Monocytes Relative: 8 %
Neutro Abs: 4.9 10*3/uL (ref 1.7–7.7)
Neutrophils Relative %: 70 %
Platelets: 173 10*3/uL (ref 150–400)
RBC: 4.64 MIL/uL (ref 4.22–5.81)
RDW: 12.9 % (ref 11.5–15.5)
WBC: 7 10*3/uL (ref 4.0–10.5)
nRBC: 0 % (ref 0.0–0.2)

## 2021-07-28 LAB — COMPREHENSIVE METABOLIC PANEL
ALT: 13 U/L (ref 0–44)
AST: 19 U/L (ref 15–41)
Albumin: 3.6 g/dL (ref 3.5–5.0)
Alkaline Phosphatase: 66 U/L (ref 38–126)
Anion gap: 9 (ref 5–15)
BUN: 26 mg/dL — ABNORMAL HIGH (ref 8–23)
CO2: 23 mmol/L (ref 22–32)
Calcium: 8.4 mg/dL — ABNORMAL LOW (ref 8.9–10.3)
Chloride: 104 mmol/L (ref 98–111)
Creatinine, Ser: 1.85 mg/dL — ABNORMAL HIGH (ref 0.61–1.24)
GFR, Estimated: 39 mL/min — ABNORMAL LOW (ref >60)
Glucose, Bld: 162 mg/dL — ABNORMAL HIGH (ref 70–99)
Potassium: 3.6 mmol/L (ref 3.5–5.1)
Sodium: 136 mmol/L (ref 135–145)
Total Bilirubin: 0.4 mg/dL (ref 0.3–1.2)
Total Protein: 7.5 g/dL (ref 6.5–8.1)

## 2021-07-28 LAB — TSH: TSH: 2.136 u[IU]/mL (ref 0.350–4.500)

## 2021-07-28 MED ORDER — SODIUM CHLORIDE 0.9 % IV SOLN
Freq: Once | INTRAVENOUS | Status: AC
  Filled 2021-07-28: qty 250

## 2021-07-28 MED ORDER — SODIUM CHLORIDE 0.9 % IV SOLN
10.0000 mg/kg | Freq: Once | INTRAVENOUS | Status: AC
  Administered 2021-07-28: 860 mg via INTRAVENOUS
  Filled 2021-07-28: qty 10

## 2021-07-28 MED ORDER — HEPARIN SOD (PORK) LOCK FLUSH 100 UNIT/ML IV SOLN
500.0000 [IU] | Freq: Once | INTRAVENOUS | Status: DC | PRN
  Filled 2021-07-28: qty 5

## 2021-07-28 MED ORDER — HEPARIN SOD (PORK) LOCK FLUSH 100 UNIT/ML IV SOLN
INTRAVENOUS | Status: AC
  Filled 2021-07-28: qty 5

## 2021-07-28 MED ORDER — SODIUM CHLORIDE 0.9% FLUSH
10.0000 mL | INTRAVENOUS | Status: DC | PRN
  Filled 2021-07-28: qty 10

## 2021-07-28 NOTE — Progress Notes (Signed)
Pt doing good . He has left foot but the right side of it with flare up of gout. He is on medicine for it colchicine

## 2021-07-28 NOTE — Progress Notes (Signed)
Hematology/Oncology Consult note [facility]  Telephone:([phone] [phone]  Patient Care Team: Perrin Maltese, MD as PCP - General (Internal Medicine) Telford Nab, RN as Oncology Nurse Navigator Sindy Guadeloupe, MD as Consulting Physician (Hematology and Oncology)   Name of the patient: Martin Jennings  [id number]  13-Oct-1950   Date of visit: 07/28/21  Diagnosis- stage IIIc adenocarcinoma of the lung cT3 cN3 M0  Chief complaint/ Reason for visit-on treatment assessment prior to cycle 26 of maintenance durvalumab  Heme/Onc history: Patient is a 71 year old male with a history of tobacco dependence who was admitted to the hospital for symptoms of acute onset shortness of breath and was found to have Covid pneumonia.  He underwent CT angio chest which showed extensive mediastinal as well as bilateral hilar adenopathy.  Masslike opacity with architectural distortion involving the right upper lobe measuring 5.7 x 2.9 x 2.3 cm and groundglass opacities bilaterally.  Mass involving the left adrenal gland as well as a smaller right adrenal gland possibly indicating adrenal metastases.     This was followed by a PET CT scan 2 weeks later which showed the consolidative process in the right upper lobe suv 7.88 concerning for infiltrating tumor.  It appears to involve the pleura associated interstitial thickening was also seen.  Bulky right hilar and mediastinal adenopathy.  Enlarged somewhat necrotic appearing left supraclavicular lymph node was also hypermetabolic.  Adrenal gland was not hypermetabolic and was consistent with a benign adenoma.  No metastatic disease involving liver.  No features of distant metastatic disease.   Pathology showed metastatic non-small cell lung cancer which was TTF-1 positive consistent with adenocarcinoma   Patient finished concurrent chemoradiation with weekly CarboTaxol.  Scan showed partial response and patient is currently on  maintenance durvalumab which was started on 07/29/2020      Interval history-patient reports doing well and denies any specific complaints at this time.  Tolerating treatments well so far  ECOG PS- 1 Pain scale- 0  Review of systems- Review of Systems  Constitutional:  Negative for chills, fever, malaise/fatigue and weight loss.  HENT:  Negative for congestion, ear discharge and nosebleeds.   Eyes:  Negative for blurred vision.  Respiratory:  Negative for cough, hemoptysis, sputum production, shortness of breath and wheezing.   Cardiovascular:  Negative for chest pain, palpitations, orthopnea and claudication.  Gastrointestinal:  Negative for abdominal pain, blood in stool, constipation, diarrhea, heartburn, melena, nausea and vomiting.  Genitourinary:  Negative for dysuria, flank pain, frequency, hematuria and urgency.  Musculoskeletal:  Negative for back pain, joint pain and myalgias.  Skin:  Negative for rash.  Neurological:  Negative for dizziness, tingling, focal weakness, seizures, weakness and headaches.  Endo/Heme/Allergies:  Does not bruise/bleed easily.  Psychiatric/Behavioral:  Negative for depression and suicidal ideas. The patient does not have insomnia.      No Known Allergies   Past Medical History:  Diagnosis Date   Bladder cancer ([place]) 2012   COVID-19 virus infection 04/03/2020   Hypertension    Lung cancer ([place])    Peripheral vascular disease ([place])      Past Surgical History:  Procedure Laterality Date   BLADDER REMOVAL     LOWER EXTREMITY ANGIOGRAPHY Left 04/12/2017   Procedure: Lower Extremity Angiography;  Surgeon: Katha Cabal, MD;  Location: [place];  Service: Cardiovascular;  Laterality: Left;   PORTA CATH INSERTION N/A 05/22/2020   Procedure: PORTA CATH INSERTION;  Surgeon: Algernon Huxley, MD;  Location: [facility];  Service: Cardiovascular;  Laterality: N/A;   uretostomy     VASCULAR SURGERY      Social History    Socioeconomic History   Marital status: Married    Spouse name: Not on file   Number of children: Not on file   Years of education: Not on file   Highest education level: Not on file  Occupational History   Not on file  Tobacco Use   Smoking status: Some Days    Packs/day: 0.50    Years: 50.00    Pack years: 25.00    Types: Cigarettes   Smokeless tobacco: Never   Tobacco comments:    Per pt. He smokes 1 pack every 3 days  Vaping Use   Vaping Use: Never used  Substance and Sexual Activity   Alcohol use: No   Drug use: Not Currently    Types: Marijuana   Sexual activity: Not Currently  Other Topics Concern   Not on file  Social History Narrative   Not on file   Social Determinants of Health   Financial Resource Strain: Not on file  Food Insecurity: Not on file  Transportation Needs: Not on file  Physical Activity: Not on file  Stress: Not on file  Social Connections: Not on file  Intimate Partner Violence: Not on file    No family history on file.   Current Outpatient Medications:    amLODipine (NORVASC) 10 MG tablet, Take 10 mg by mouth daily. , Disp: , Rfl:    atorvastatin (LIPITOR) 20 MG tablet, Take 20 mg by mouth every evening. , Disp: , Rfl:    clopidogrel (PLAVIX) 75 MG tablet, Take 75 mg by mouth daily. , Disp: , Rfl:    colchicine 0.6 MG tablet, Take 0.6 mg by mouth daily., Disp: , Rfl:    lidocaine-prilocaine (EMLA) cream, Apply 1 application topically as needed., Disp: 30 g, Rfl: 2   oxyCODONE (OXY IR/ROXICODONE) 5 MG immediate release tablet, TAKE 1 TABLET BY MOUTH EVERY 6 HOURS AS NEEDED FOR SEVERE PAIN, Disp: 45 tablet, Rfl: 0 No current facility-administered medications for this visit.  Facility-Administered Medications Ordered in Other Visits:    heparin lock flush 100 UNIT/ML injection, , , ,    heparin lock flush 100 unit/mL, 500 Units, Intracatheter, Once PRN, Sindy Guadeloupe, MD   sodium chloride flush (NS) 0.9 % injection 10 mL, 10 mL,  Intravenous, PRN, Sindy Guadeloupe, MD, 10 mL at 09/18/20 0850   sodium chloride flush (NS) 0.9 % injection 10 mL, 10 mL, Intracatheter, PRN, Sindy Guadeloupe, MD  Physical exam:  Vitals:   07/28/21 0959  BP: 130/74  Pulse: 80  Resp: 16  Temp: 97.7 F (36.5 C)  TempSrc: Tympanic  SpO2: 97%  Weight: 193 lb 8 oz (87.8 kg)  Height: 5\' 11"  (1.803 m)   Physical Exam Constitutional:      General: He is not in acute distress. Cardiovascular:     Rate and Rhythm: Normal rate and regular rhythm.     Heart sounds: Normal heart sounds.  Pulmonary:     Effort: Pulmonary effort is normal.     Breath sounds: Normal breath sounds.  Abdominal:     General: Bowel sounds are normal.     Palpations: Abdomen is soft.  Skin:    General: Skin is warm and dry.  Neurological:     Mental Status: He is alert and oriented to person, place, and time.  CMP Latest Ref Rng & Units 07/28/2021  Glucose 70 - 99 mg/dL 162(H)  BUN 8 - 23 mg/dL 26(H)  Creatinine 0.61 - 1.24 mg/dL 1.85(H)  Sodium 135 - 145 mmol/L 136  Potassium 3.5 - 5.1 mmol/L 3.6  Chloride 98 - 111 mmol/L 104  CO2 22 - 32 mmol/L 23  Calcium 8.9 - 10.3 mg/dL 8.4(L)  Total Protein 6.5 - 8.1 g/dL 7.5  Total Bilirubin 0.3 - 1.2 mg/dL 0.4  Alkaline Phos 38 - 126 U/L 66  AST 15 - 41 U/L 19  ALT 0 - 44 U/L 13   CBC Latest Ref Rng & Units 07/28/2021  WBC 4.0 - 10.5 K/uL 7.0  Hemoglobin 13.0 - 17.0 g/dL 14.3  Hematocrit 39.0 - 52.0 % 43.9  Platelets 150 - 400 K/uL 173   Assessment and plan- Patient is a 71 y.o. male with stage IIIc adenocarcinoma of the right upper lobe of the lung cT4 N3 M0.  He is here for on treatment assessment prior to cycle 26 of maintenance durvalumab  Counts okay to proceed with cycle 26 of maintenance durvalumab today which would be his last cycle.  Plan is to now monitor him off treatment with surveillance scans every 3 months.  We will plan to get CT chest and abdomen pelvis without contrast in about 6 weeks and  see him thereafter.  CKD: Overall stable   Visit Diagnosis 1. Encounter for antineoplastic immunotherapy   2. Malignant neoplasm of upper lobe of left lung ([place])      Dr. Randa Evens, MD, MPH [facility] [phone] 07/28/2021 1:16 PM

## 2021-07-28 NOTE — Progress Notes (Signed)
Creatinine 1.85 - ok to proceed with durvalumab per Dr. Janese Banks

## 2021-07-28 NOTE — Patient Instructions (Signed)
[facility] AT [place]  Discharge Instructions: Thank you for choosing [facility] to provide your oncology and hematology care.  If you have a lab appointment with the [facility], please go directly to the [facility] and check in at the registration area.  Wear comfortable clothing and clothing appropriate for easy access to any Portacath or PICC line.   We strive to give you quality time with your provider. You may need to reschedule your appointment if you arrive late (15 or more minutes).  Arriving late affects you and other patients whose appointments are after yours.  Also, if you miss three or more appointments without notifying the office, you may be dismissed from the clinic at the providers discretion.      For prescription refill requests, have your pharmacy contact our office and allow 72 hours for refills to be completed.    Today you received the following chemotherapy and/or immunotherapy agents - durvalumab      To help prevent nausea and vomiting after your treatment, we encourage you to take your nausea medication as directed.  BELOW ARE SYMPTOMS THAT SHOULD BE REPORTED IMMEDIATELY: *FEVER GREATER THAN 100.4 F (38 C) OR HIGHER *CHILLS OR SWEATING *NAUSEA AND VOMITING THAT IS NOT CONTROLLED WITH YOUR NAUSEA MEDICATION *UNUSUAL SHORTNESS OF BREATH *UNUSUAL BRUISING OR BLEEDING *URINARY PROBLEMS (pain or burning when urinating, or frequent urination) *BOWEL PROBLEMS (unusual diarrhea, constipation, pain near the anus) TENDERNESS IN MOUTH AND THROAT WITH OR WITHOUT PRESENCE OF ULCERS (sore throat, sores in mouth, or a toothache) UNUSUAL RASH, SWELLING OR PAIN  UNUSUAL VAGINAL DISCHARGE OR ITCHING   Items with * indicate a potential emergency and should be followed up as soon as possible or go to the Emergency Department if any problems should occur.  Please show the CHEMOTHERAPY ALERT CARD or IMMUNOTHERAPY ALERT CARD at check-in  to the Emergency Department and triage nurse.  Should you have questions after your visit or need to cancel or reschedule your appointment, please contact [facility] [place] [facility] [place]  [phone] and follow the prompts.  Office hours are 8:00 a.m. to 4:30 p.m. Monday - Friday. Please note that voicemails left after 4:00 p.m. may not be returned until the following business day.  We are closed weekends and major holidays. You have access to a nurse at all times for urgent questions. Please call the main number to the clinic [phone] and follow the prompts.  For any non-urgent questions, you may also contact your provider using MyChart. We now offer e-Visits for anyone 13 and older to request care online for non-urgent symptoms. For details visit [email or web address].   Also download the MyChart app! Go to the app store, search "MyChart", open the app, select [facility], and log in with your MyChart username and password.  Due to Covid, a mask is required upon entering the hospital/clinic. If you do not have a mask, one will be given to you upon arrival. For doctor visits, patients may have 1 support person aged 42 or older with them. For treatment visits, patients cannot have anyone with them due to current Covid guidelines and our immunocompromised population.   Durvalumab injection What is this medication? DURVALUMAB (dur VAL ue mab) is a monoclonal antibody. It is used to treat lung cancer. This medicine may be used for other purposes; ask your health care provider or pharmacist if you have questions. COMMON BRAND NAME(S): [email or web address] What should I tell my care team before I  take this medication? They need to know if you have any of these conditions: autoimmune diseases like Crohn's disease, ulcerative colitis, or lupus have had or planning to have an allogeneic stem cell transplant (uses someone else's stem cells) history of organ transplant history of radiation to the  chest nervous system problems like myasthenia gravis or Guillain-Barre syndrome an unusual or allergic reaction to durvalumab, other medicines, foods, dyes, or preservatives pregnant or trying to get pregnant breast-feeding How should I use this medication? This medicine is for infusion into a vein. It is given by a health care professional in a hospital or clinic setting. A special MedGuide will be given to you before each treatment. Be sure to read this information carefully each time. Talk to your pediatrician regarding the use of this medicine in children. Special care may be needed. Overdosage: If you think you have taken too much of this medicine contact a poison control center or emergency room at once. NOTE: This medicine is only for you. Do not share this medicine with others. What if I miss a dose? It is important not to miss your dose. Call your doctor or health care professional if you are unable to keep an appointment. What may interact with this medication? Interactions have not been studied. This list may not describe all possible interactions. Give your health care provider a list of all the medicines, herbs, non-prescription drugs, or dietary supplements you use. Also tell them if you smoke, drink alcohol, or use illegal drugs. Some items may interact with your medicine. What should I watch for while using this medication? This medication may make you feel generally unwell. Continue your course of treatment even though you feel ill unless your care team tells you to stop. You may need blood work done while you are taking this medication. Do not become pregnant while taking this medication or for 3 months after stopping it. Women should inform their care team if they wish to become pregnant or think they might be pregnant. There is a potential for serious side effects to an unborn child. Talk to your care team or pharmacist for more information. Do not breast-feed an infant while  taking this medication or for 3 months after stopping it. What side effects may I notice from receiving this medication? Side effects that you should report to your care team as soon as possible: Allergic reactions--skin rash, itching, hives, swelling of the face, lips, tongue, or throat Bloody or watery diarrhea Dizziness, loss of balance or coordination, confusion or trouble speaking Dry cough, shortness of breath or trouble breathing Flushing, mostly over the face, neck, and chest, during injection High blood sugar (hyperglycemia)--increased thirst or amount of urine, unusual weakness or fatigue, blurry vision High thyroid levels (hyperthyroidism)--fast or irregular heartbeat, weight loss, excessive sweating or sensitivity to heat, tremors or shaking, anxiety, nervousness, irregular menstrual cycle or spotting Infection--fever, chills, cough, or sore throat Liver injury--right upper belly pain, loss of appetite, nausea, light-colored stool, dark yellow or brown urine, yellowing skin or eyes, unusual weakness or fatigue Low adrenal gland function--nausea, vomiting, loss of appetite, unusual weakness or fatigue, dizziness, low blood pressure Low thyroid levels (hypothyroidism)--unusual weakness or fatigue, increased sensitivity to cold, constipation, hair loss, dry skin, weight gain, feelings of depression Pancreatitis--severe stomach pain that spreads to your back or gets worse after eating or when touched, fever, nausea, vomiting Rash, fever, and swollen lymph nodes Redness, blistering, peeling or loosening of the skin, including inside the mouth Wheezing--trouble breathing  with loud or whistling sounds Side effects that usually do not require medical attention (report these to your care team if they continue or are bothersome): Fatigue Hair loss This list may not describe all possible side effects. Call your doctor for medical advice about side effects. You may report side effects to FDA at  1-800-FDA-1088. Where should I keep my medication? This medication is given in a hospital or clinic. It will not be stored at home. NOTE: This sheet is a summary. It may not cover all possible information. If you have questions about this medicine, talk to your doctor, pharmacist, or health care provider.  2022 Elsevier/Gold Standard (2021-03-31 00:00:00)

## 2021-07-30 ENCOUNTER — Other Ambulatory Visit: Payer: Self-pay

## 2021-07-30 ENCOUNTER — Encounter: Payer: Self-pay | Admitting: Radiation Oncology

## 2021-07-30 ENCOUNTER — Ambulatory Visit
Admission: RE | Admit: 2021-07-30 | Discharge: 2021-07-30 | Disposition: A | Discharge status: Home / Self Care | Payer: Medicare Other | Source: Ambulatory Visit | Attending: Radiation Oncology | Admitting: Radiation Oncology

## 2021-07-30 VITALS — BP 128/74 | HR 75 | Temp 97.2°F | Wt 193.3 lb

## 2021-07-30 DIAGNOSIS — Z923 Personal history of irradiation: Secondary | ICD-10-CM | POA: Insufficient documentation

## 2021-07-30 DIAGNOSIS — Z9221 Personal history of antineoplastic chemotherapy: Secondary | ICD-10-CM | POA: Diagnosis not present

## 2021-07-30 DIAGNOSIS — C3412 Malignant neoplasm of upper lobe, left bronchus or lung: Secondary | ICD-10-CM

## 2021-07-30 DIAGNOSIS — C3411 Malignant neoplasm of upper lobe, right bronchus or lung: Secondary | ICD-10-CM | POA: Diagnosis not present

## 2021-07-30 DIAGNOSIS — Z08 Encounter for follow-up examination after completed treatment for malignant neoplasm: Secondary | ICD-10-CM | POA: Diagnosis not present

## 2021-07-30 NOTE — Progress Notes (Signed)
Radiation Oncology Follow up Note  Name: Martin Jennings   Date:   07/30/2021 MRN:  [id number] DOB: 01-Apr-1951    This 71 y.o. 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  REFERRING PROVIDER: Perrin Maltese, MD  HPI: Patient is a 71 year old male now out 11 months having completed concurrent chemoradiation therapy for stage IIIc adenocarcinoma the right upper lobe.  He is being on maintenance.  Durvalumab which she has been tolerating well.  He specifically denies cough hemoptysis chest tightness p.o. intake is good.  He is having no pain issues.  Recent CT scan of his chest shows stable posttreatment appearance of the right upper lobe and perihilar regions.  No evidence of adenopathy or other areas suggestive of progressive disease.  COMPLICATIONS OF TREATMENT: none  FOLLOW UP COMPLIANCE: keeps appointments   PHYSICAL EXAM:  BP 128/74    Pulse 75    Temp (!) 97.2 F (36.2 C) (Tympanic)    Wt 193 lb 4.8 oz (87.7 kg)    BMI 26.96 kg/m  Well-developed well-nourished patient in NAD. HEENT reveals PERLA, EOMI, discs not visualized.  Oral cavity is clear. No oral mucosal lesions are identified. Neck is clear without evidence of cervical or supraclavicular adenopathy. Lungs are clear to A&P. Cardiac examination is essentially unremarkable with regular rate and rhythm without murmur rub or thrill. Abdomen is benign with no organomegaly or masses noted. Motor sensory and DTR levels are equal and symmetric in the upper and lower extremities. Cranial nerves II through XII are grossly intact. Proprioception is intact. No peripheral adenopathy or edema is identified. No motor or sensory levels are noted. Crude visual fields are within normal range.  RADIOLOGY RESULTS: Serial CT scans reviewed compatible with above-stated findings  PLAN: At this time patient is doing well with no evidence  of disease now at 11 months from concurrent chemoradiation for stage IIIc disease.  He is currently on maintenance immunotherapy under medical oncology's direction.  I have asked to see him back in 1 year for follow-up.  I be happy to reevaluate him at any time should further consultation be indicated. I would like to take this opportunity to thank you for allowing me to participate in the care of your patient.Noreene Filbert, MD

## 2021-08-10 DIAGNOSIS — Z936 Other artificial openings of urinary tract status: Secondary | ICD-10-CM | POA: Diagnosis not present

## 2021-08-18 ENCOUNTER — Other Ambulatory Visit: Payer: Self-pay | Admitting: Oncology

## 2021-08-19 ENCOUNTER — Encounter: Payer: Self-pay | Admitting: Oncology

## 2021-08-19 NOTE — Telephone Encounter (Signed)
Please let him know he is no longer on chemo. Lung cancer in remission. Would recommend weaning off pain meds. I may not be able to continue beyond 3 months

## 2021-08-19 NOTE — Telephone Encounter (Signed)
I called and spoke with patient and explained to him Dr Elroy Channel response to wean off his Oxycodone as his chemotherapy has completed and his lung cancer is in remission. I recommended that he cut back to every 8 hours for a few days then every 12 hours, then daily then stop. I also advised that if his pain is not controlled or gets worse to please contact us immediately. He was in agreement with this

## 2021-09-06 IMAGING — CT CT CHEST-ABD-PELV W/O CM
2 of 4 series · 12 of 36 positions shown, 14 images · non-contrast
Comparison: 04/22/2020 PET-CT. 04/05/2020 chest CT angiogram.
06/29/2016 CT chest, abdomen and pelvis.

CLINICAL DATA: Stage IIIc non-small cell right upper lobe lung
adenocarcinoma status post concurrent chemoradiation therapy.
Restaging. Remote history of bladder cancer.

EXAM:
CT CHEST, ABDOMEN AND PELVIS WITHOUT CONTRAST
TECHNIQUE: Multidetector CT imaging of the chest, abdomen and pelvis was
performed following the standard protocol without IV contrast.

[Series 2: axials cap 5.00 · axial · 0.81mm/px · z∈[-1535,-935]mm · 9 of 144 slices shown, 11 images]
[im 12/144  mediastinal]
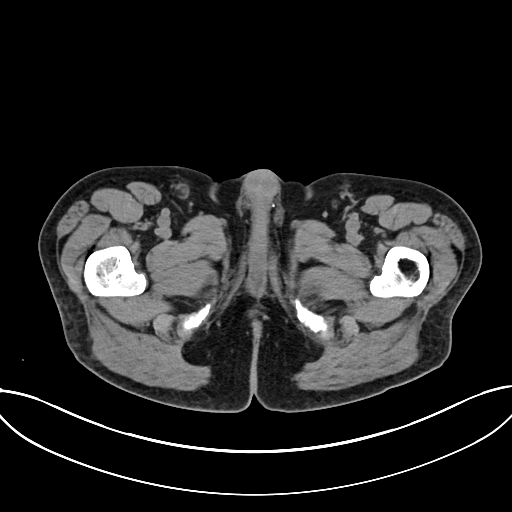
[im 12/144  bone]
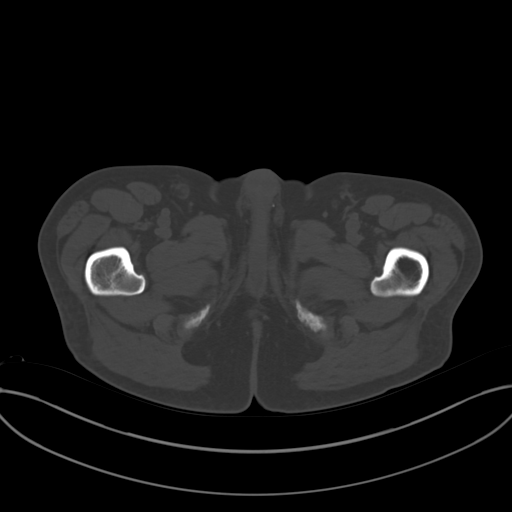
[im 24/144  mediastinal]
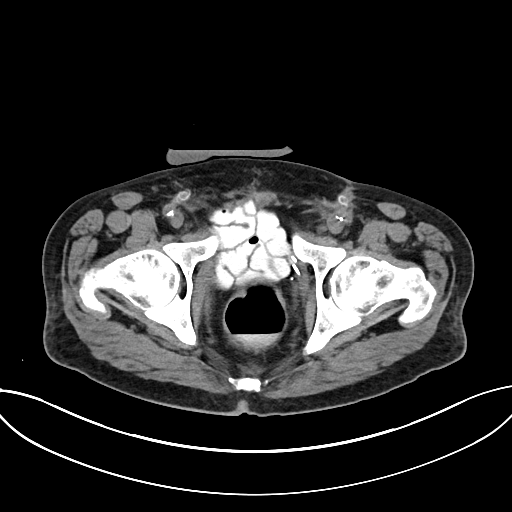
[im 48/144  mediastinal]
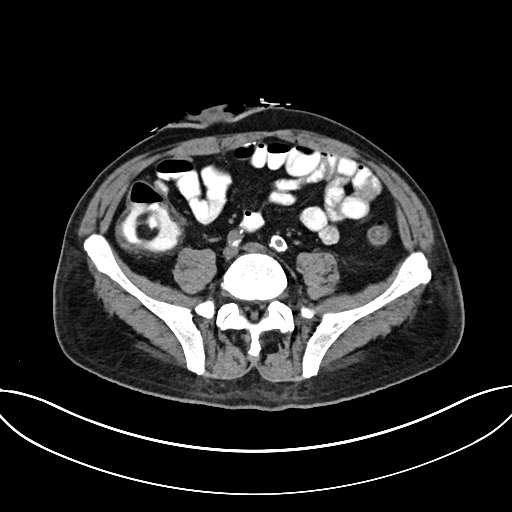
[im 60/144  mediastinal]
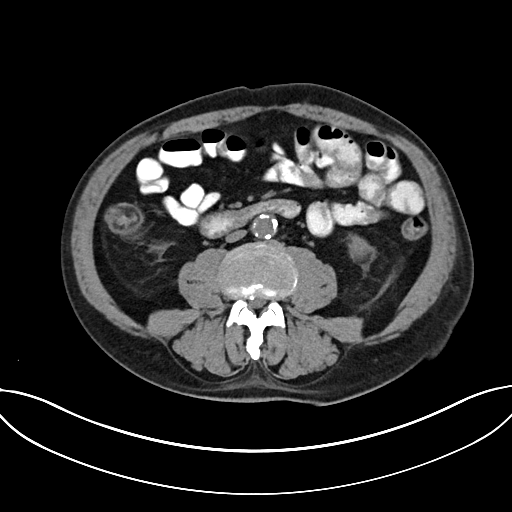
[im 72/144  mediastinal]
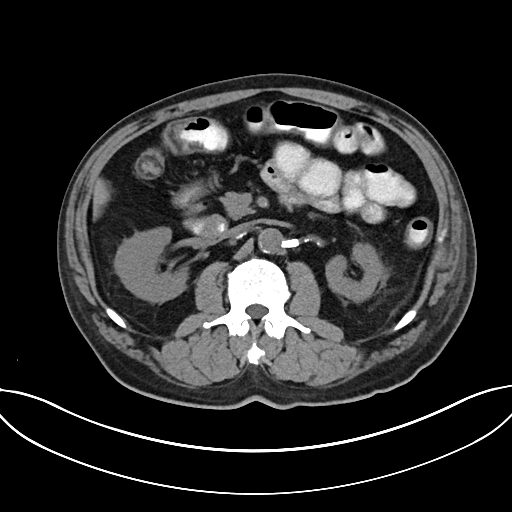
[im 84/144  mediastinal]
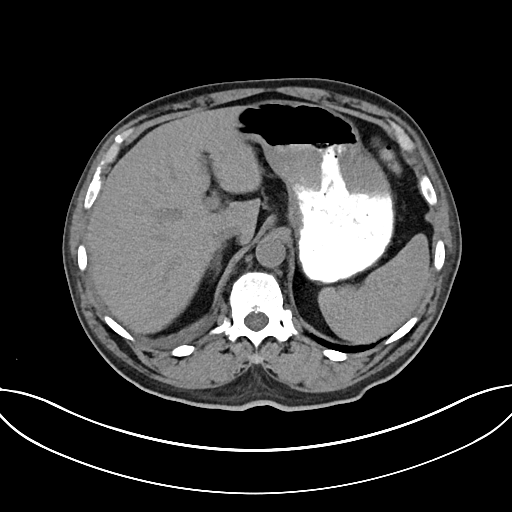
[im 96/144  mediastinal]
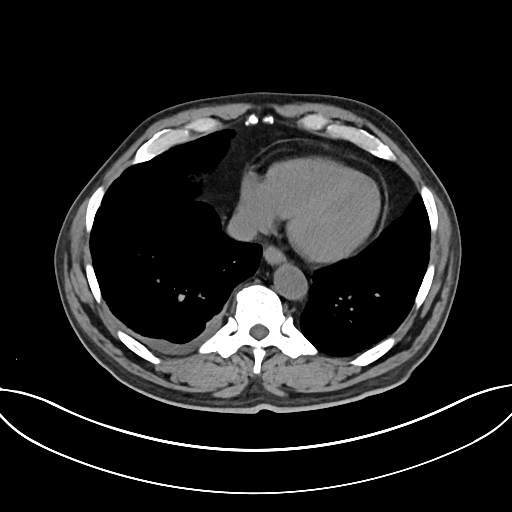
[im 120/144  mediastinal]
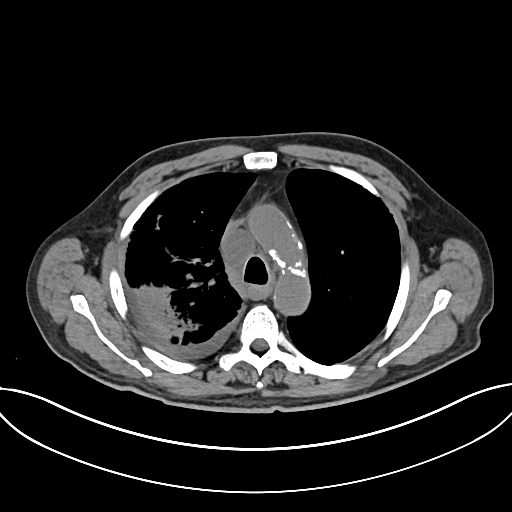
[im 132/144  mediastinal]
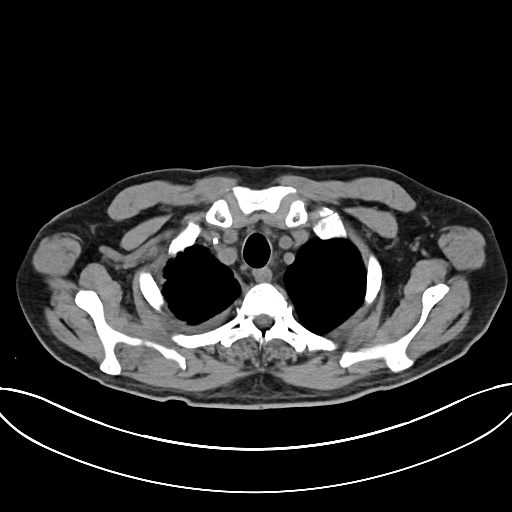
[im 132/144  bone]
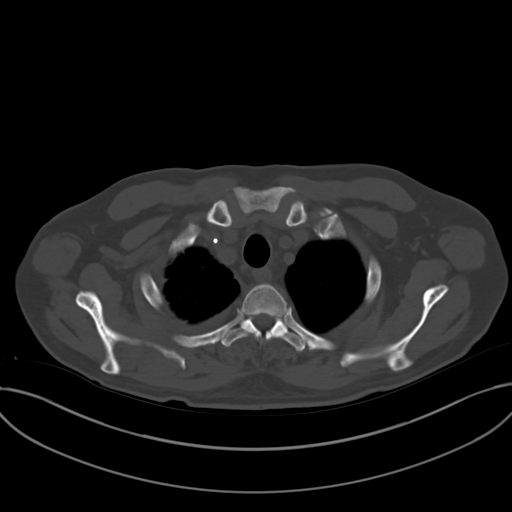

[Series 4: coronals cap 2.00 cor · coronal · 0.80mm/px · 3 of 141 slices shown]
[im 29/141  mediastinal]
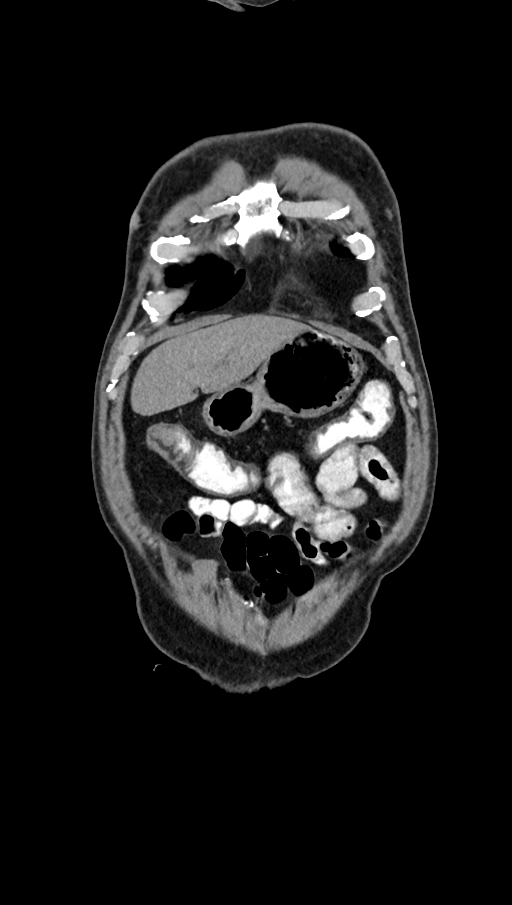
[im 57/141  mediastinal]
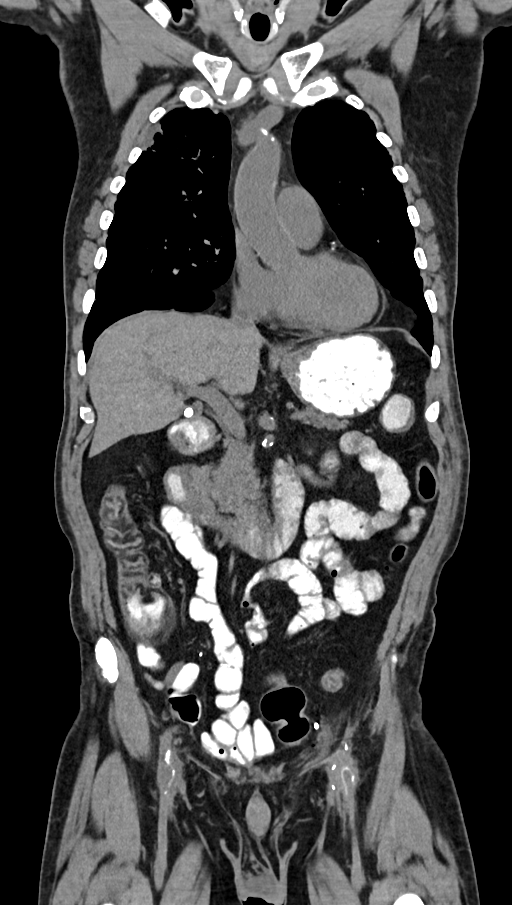
[im 85/141  mediastinal]
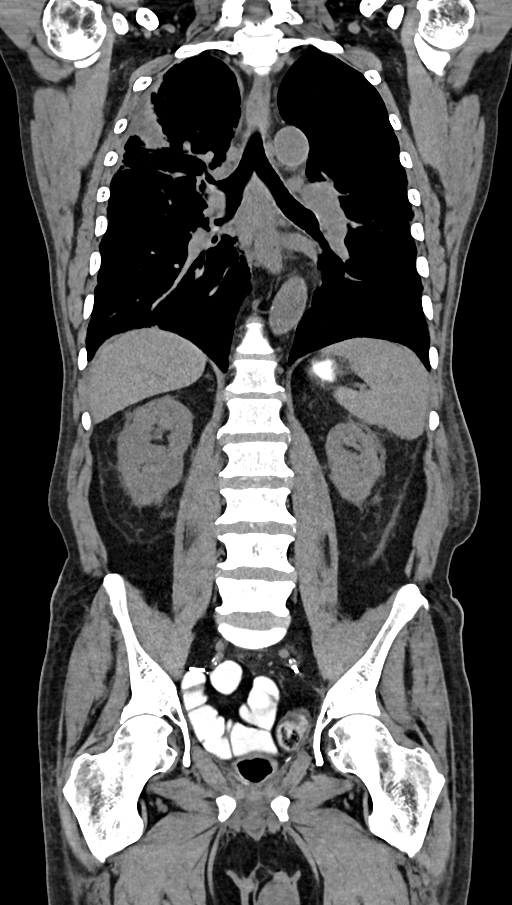

[12 of 36 positions shown; findings below may reference images not displayed]

FINDINGS: CT CHEST FINDINGS

Cardiovascular: Normal heart size. No significant pericardial
effusion/thickening. Three-vessel coronary atherosclerosis.
Atherosclerotic nonaneurysmal thoracic aorta. Dilated main pulmonary
artery. Right internal jugular Port-A-Cath terminates in the upper
third of the SVC.

Mediastinum/Nodes: No discrete thyroid nodules. Unremarkable
esophagus. No axillary adenopathy. Previously visualized enlarged
1.2 cm left supraclavicular node has decreased to 0.7 cm (series
2/image 6). Enlarged 2.6 cm right paratracheal node (series 2/image
24), stable. Enlarged 1.1 cm subcarinal node (series 2/image 33),
decreased from 1.5 cm. Enlarged 1.2 cm AP window node (series
2/image 30), stable. Enlarged 1.9 cm right hilar node (series
2/image 35), decreased from 2.4 cm. No discrete left hilar
adenopathy on these noncontrast images.

Lungs/Pleura: No pneumothorax. Small dependent right pleural
effusion, new. No left pleural effusion. Moderate centrilobular
emphysema. Irregular masslike focus of consolidation in the
peripheral right upper lobe measures 10.7 x 4.3 cm (series 3/image
58), previously 7.9 x 3.3 cm on 04/22/2020 PET-CT study using
similar measurement technique, increased. No new significant
pulmonary nodules. Widespread patchy peripheral reticulation and
ground-glass opacity throughout both lungs with associated mild
traction bronchiectasis, suggestive of evolving postinflammatory
fibrosis. Previously visualized patchy consolidation at the left
lung base has resolved.

Musculoskeletal: No aggressive appearing focal osseous lesions. Mild
thoracic spondylosis.

CT ABDOMEN PELVIS FINDINGS

Hepatobiliary: Normal liver size. Subcentimeter hypodense left liver
dome lesion is too small to characterize and is unchanged, more
likely benign. No new liver lesions. Cholelithiasis. No biliary
ductal dilatation.

Pancreas: Normal, with no mass or duct dilation.

Spleen: Normal size. No mass.

Adrenals/Urinary Tract: Left adrenal 2.6 cm nodule with density 6
HU, stable, compatible with a benign adenoma. No discrete right
adrenal nodule. No hydronephrosis. No renal stones. Simple 1.0 cm
upper left renal cyst. Status post cystectomy with ileal conduit
urinary diversion in the ventral right abdominal wall. Is of conduit
is decompressed and appears normal.

Stomach/Bowel: Small hiatal hernia. Otherwise normal nondistended
stomach. Normal caliber small bowel with no small bowel wall
thickening. Normal appendix. Oral contrast transits to the colon.
Normal large bowel with no diverticulosis, large bowel wall
thickening or pericolonic fat stranding.

Vascular/Lymphatic: Atherosclerotic nonaneurysmal abdominal aorta.
Multiple arterial stents are present extending from the aortic
bifurcation into the external iliac arteries bilaterally.
Femoral-femoral arterial bypass graft in place. No pathologically
enlarged lymph nodes in the abdomen or pelvis.

Reproductive: Prostatectomy.

Other: No pneumoperitoneum, ascites or focal fluid collection.

Musculoskeletal: No aggressive appearing focal osseous lesions.
Moderate lumbar spondylosis.
IMPRESSION: 1. Interval mixed response to therapy.
2. Irregular masslike consolidation in the peripheral right upper
lobe is increased. Although a portion of this could represent early
radiation change, progressive primary tumor cannot be excluded. New
small dependent right pleural effusion.
3. Decreased left supraclavicular, right hilar and subcarinal
lymphadenopathy. Stable paratracheal and AP window adenopathy.
4. No evidence of metastatic disease in the abdomen or pelvis.
5. Suggestion of background evolving postinflammatory fibrosis
throughout both lungs, probably due to patient's history of IJMLB-VL
infection in [DATE]. Chronic findings include: Three-vessel coronary atherosclerosis.
Dilated main pulmonary artery, suggesting pulmonary arterial
hypertension. Cholelithiasis. Small hiatal hernia. Left adrenal
adenoma. Aortic Atherosclerosis (O3MO0-455.5) and Emphysema
(O3MO0-M30.5).

## 2021-09-07 ENCOUNTER — Ambulatory Visit
Admission: RE | Admit: 2021-09-07 | Discharge: 2021-09-07 | Disposition: A | Discharge status: Home / Self Care | Payer: Medicare Other | Source: Ambulatory Visit | Attending: Oncology | Admitting: Oncology

## 2021-09-07 ENCOUNTER — Other Ambulatory Visit: Payer: Self-pay

## 2021-09-07 DIAGNOSIS — K449 Diaphragmatic hernia without obstruction or gangrene: Secondary | ICD-10-CM | POA: Diagnosis not present

## 2021-09-07 DIAGNOSIS — C3411 Malignant neoplasm of upper lobe, right bronchus or lung: Secondary | ICD-10-CM | POA: Diagnosis not present

## 2021-09-07 DIAGNOSIS — I2510 Atherosclerotic heart disease of native coronary artery without angina pectoris: Secondary | ICD-10-CM | POA: Diagnosis not present

## 2021-09-07 DIAGNOSIS — D3500 Benign neoplasm of unspecified adrenal gland: Secondary | ICD-10-CM | POA: Diagnosis not present

## 2021-09-07 DIAGNOSIS — K8020 Calculus of gallbladder without cholecystitis without obstruction: Secondary | ICD-10-CM | POA: Diagnosis not present

## 2021-09-07 DIAGNOSIS — C3491 Malignant neoplasm of unspecified part of right bronchus or lung: Secondary | ICD-10-CM | POA: Diagnosis not present

## 2021-09-07 DIAGNOSIS — Z8551 Personal history of malignant neoplasm of bladder: Secondary | ICD-10-CM | POA: Diagnosis not present

## 2021-09-15 ENCOUNTER — Encounter: Payer: Self-pay | Admitting: Oncology

## 2021-09-15 ENCOUNTER — Inpatient Hospital Stay: Payer: Medicare Other | Attending: Oncology

## 2021-09-15 ENCOUNTER — Inpatient Hospital Stay (HOSPITAL_BASED_OUTPATIENT_CLINIC_OR_DEPARTMENT_OTHER): Payer: Medicare Other | Admitting: Oncology

## 2021-09-15 ENCOUNTER — Other Ambulatory Visit: Payer: Self-pay

## 2021-09-15 VITALS — BP 140/75 | HR 78 | Temp 98.7°F | Resp 18 | Ht 71.0 in | Wt 196.4 lb

## 2021-09-15 DIAGNOSIS — Z79899 Other long term (current) drug therapy: Secondary | ICD-10-CM | POA: Diagnosis not present

## 2021-09-15 DIAGNOSIS — Z8551 Personal history of malignant neoplasm of bladder: Secondary | ICD-10-CM | POA: Insufficient documentation

## 2021-09-15 DIAGNOSIS — Z9221 Personal history of antineoplastic chemotherapy: Secondary | ICD-10-CM | POA: Diagnosis not present

## 2021-09-15 DIAGNOSIS — C3411 Malignant neoplasm of upper lobe, right bronchus or lung: Secondary | ICD-10-CM

## 2021-09-15 DIAGNOSIS — C3491 Malignant neoplasm of unspecified part of right bronchus or lung: Secondary | ICD-10-CM | POA: Insufficient documentation

## 2021-09-15 DIAGNOSIS — Z08 Encounter for follow-up examination after completed treatment for malignant neoplasm: Secondary | ICD-10-CM | POA: Diagnosis not present

## 2021-09-15 DIAGNOSIS — Z923 Personal history of irradiation: Secondary | ICD-10-CM | POA: Diagnosis not present

## 2021-09-15 DIAGNOSIS — Z85118 Personal history of other malignant neoplasm of bronchus and lung: Secondary | ICD-10-CM

## 2021-09-15 DIAGNOSIS — F17210 Nicotine dependence, cigarettes, uncomplicated: Secondary | ICD-10-CM | POA: Insufficient documentation

## 2021-09-15 DIAGNOSIS — Z95828 Presence of other vascular implants and grafts: Secondary | ICD-10-CM

## 2021-09-15 LAB — CBC WITH DIFFERENTIAL/PLATELET
Abs Immature Granulocytes: 0.02 10*3/uL (ref 0.00–0.07)
Basophils Absolute: 0 10*3/uL (ref 0.0–0.1)
Basophils Relative: 0 %
Eosinophils Absolute: 0.6 10*3/uL — ABNORMAL HIGH (ref 0.0–0.5)
Eosinophils Relative: 9 %
HCT: 46.3 % (ref 39.0–52.0)
Hemoglobin: 15.4 g/dL (ref 13.0–17.0)
Immature Granulocytes: 0 %
Lymphocytes Relative: 17 %
Lymphs Abs: 1.2 10*3/uL (ref 0.7–4.0)
MCH: 31.2 pg (ref 26.0–34.0)
MCHC: 33.3 g/dL (ref 30.0–36.0)
MCV: 93.7 fL (ref 80.0–100.0)
Monocytes Absolute: 0.5 10*3/uL (ref 0.1–1.0)
Monocytes Relative: 7 %
Neutro Abs: 4.7 10*3/uL (ref 1.7–7.7)
Neutrophils Relative %: 67 %
Platelets: 203 10*3/uL (ref 150–400)
RBC: 4.94 MIL/uL (ref 4.22–5.81)
RDW: 12.6 % (ref 11.5–15.5)
WBC: 7.1 10*3/uL (ref 4.0–10.5)
nRBC: 0 % (ref 0.0–0.2)

## 2021-09-15 LAB — COMPREHENSIVE METABOLIC PANEL
ALT: 13 U/L (ref 0–44)
AST: 14 U/L — ABNORMAL LOW (ref 15–41)
Albumin: 3.7 g/dL (ref 3.5–5.0)
Alkaline Phosphatase: 72 U/L (ref 38–126)
Anion gap: 5 (ref 5–15)
BUN: 27 mg/dL — ABNORMAL HIGH (ref 8–23)
CO2: 25 mmol/L (ref 22–32)
Calcium: 8.6 mg/dL — ABNORMAL LOW (ref 8.9–10.3)
Chloride: 106 mmol/L (ref 98–111)
Creatinine, Ser: 1.81 mg/dL — ABNORMAL HIGH (ref 0.61–1.24)
GFR, Estimated: 40 mL/min — ABNORMAL LOW (ref >60)
Glucose, Bld: 114 mg/dL — ABNORMAL HIGH (ref 70–99)
Potassium: 3.4 mmol/L — ABNORMAL LOW (ref 3.5–5.1)
Sodium: 136 mmol/L (ref 135–145)
Total Bilirubin: 0.5 mg/dL (ref 0.3–1.2)
Total Protein: 7.4 g/dL (ref 6.5–8.1)

## 2021-09-15 MED ORDER — HEPARIN SOD (PORK) LOCK FLUSH 100 UNIT/ML IV SOLN
500.0000 [IU] | Freq: Once | INTRAVENOUS | Status: AC
  Administered 2021-09-15: 500 [IU] via INTRAVENOUS
  Filled 2021-09-15: qty 5

## 2021-09-15 MED ORDER — SODIUM CHLORIDE 0.9% FLUSH
10.0000 mL | INTRAVENOUS | Status: DC | PRN
  Administered 2021-09-15: 10 mL via INTRAVENOUS
  Filled 2021-09-15: qty 10

## 2021-09-15 NOTE — Progress Notes (Signed)
Hematology/Oncology Consult note [facility]  Telephone:([phone] [phone]  Patient Care Team: Perrin Maltese, MD as PCP - General (Internal Medicine) Telford Nab, RN as Oncology Nurse Navigator Sindy Guadeloupe, MD as Consulting Physician (Hematology and Oncology)   Name of the patient: Martin Jennings  [id number]  08/09/1950   Date of visit: 09/15/21  Diagnosis- stage IIIc adenocarcinoma of the lung cT3 cN3 M0  Chief complaint/ Reason for visit-discuss CT scan results and further management  Heme/Onc history: Patient is a 71 year old male with a history of tobacco dependence who was admitted to the hospital for symptoms of acute onset shortness of breath and was found to have Covid pneumonia.  He underwent CT angio chest which showed extensive mediastinal as well as bilateral hilar adenopathy.  Masslike opacity with architectural distortion involving the right upper lobe measuring 5.7 x 2.9 x 2.3 cm and groundglass opacities bilaterally.  Mass involving the left adrenal gland as well as a smaller right adrenal gland possibly indicating adrenal metastases.     This was followed by a PET CT scan 2 weeks later which showed the consolidative process in the right upper lobe suv 7.88 concerning for infiltrating tumor.  It appears to involve the pleura associated interstitial thickening was also seen.  Bulky right hilar and mediastinal adenopathy.  Enlarged somewhat necrotic appearing left supraclavicular lymph node was also hypermetabolic.  Adrenal gland was not hypermetabolic and was consistent with a benign adenoma.  No metastatic disease involving liver.  No features of distant metastatic disease.   Pathology showed metastatic non-small cell lung cancer which was TTF-1 positive consistent with adenocarcinoma   Patient finished concurrent chemoradiation with weekly CarboTaxol.  Patient completed a year of maintenance durvalumab in early January 2023 with  stable disease.  Interval history-reports mild chronic fatigue which is essentially stable.  Reports chronic left hip pain ECOG PS- 1 Pain scale- 3   Review of systems- Review of Systems  Constitutional:  Positive for malaise/fatigue. Negative for chills, fever and weight loss.  HENT:  Negative for congestion, ear discharge and nosebleeds.   Eyes:  Negative for blurred vision.  Respiratory:  Negative for cough, hemoptysis, sputum production, shortness of breath and wheezing.   Cardiovascular:  Negative for chest pain, palpitations, orthopnea and claudication.  Gastrointestinal:  Negative for abdominal pain, blood in stool, constipation, diarrhea, heartburn, melena, nausea and vomiting.  Genitourinary:  Negative for dysuria, flank pain, frequency, hematuria and urgency.  Musculoskeletal:  Positive for joint pain (Left hip pain). Negative for back pain and myalgias.  Skin:  Negative for rash.  Neurological:  Negative for dizziness, tingling, focal weakness, seizures, weakness and headaches.  Endo/Heme/Allergies:  Does not bruise/bleed easily.  Psychiatric/Behavioral:  Negative for depression and suicidal ideas. The patient does not have insomnia.       No Known Allergies   Past Medical History:  Diagnosis Date   Bladder cancer ([place]) 2012   COVID-19 virus infection 04/03/2020   Hypertension    Lung cancer ([place])    Peripheral vascular disease ([place])      Past Surgical History:  Procedure Laterality Date   BLADDER REMOVAL     LOWER EXTREMITY ANGIOGRAPHY Left 04/12/2017   Procedure: Lower Extremity Angiography;  Surgeon: Katha Cabal, MD;  Location: [facility];  Service: Cardiovascular;  Laterality: Left;   PORTA CATH INSERTION N/A 05/22/2020   Procedure: PORTA CATH INSERTION;  Surgeon: Algernon Huxley, MD;  Location: [facility];  Service: Cardiovascular;  Laterality: N/A;   uretostomy     VASCULAR SURGERY      Social History   Socioeconomic History    Marital status: Married    Spouse name: Not on file   Number of children: Not on file   Years of education: Not on file   Highest education level: Not on file  Occupational History   Not on file  Tobacco Use   Smoking status: Some Days    Packs/day: 0.50    Years: 50.00    Pack years: 25.00    Types: Cigarettes   Smokeless tobacco: Never   Tobacco comments:    Per pt. He smokes 1 pack every 3 days  Vaping Use   Vaping Use: Never used  Substance and Sexual Activity   Alcohol use: No   Drug use: Not Currently    Types: Marijuana   Sexual activity: Not Currently  Other Topics Concern   Not on file  Social History Narrative   Not on file   Social Determinants of Health   Financial Resource Strain: Not on file  Food Insecurity: Not on file  Transportation Needs: Not on file  Physical Activity: Not on file  Stress: Not on file  Social Connections: Not on file  Intimate Partner Violence: Not on file    History reviewed. No pertinent family history.   Current Outpatient Medications:    amLODipine (NORVASC) 10 MG tablet, Take 10 mg by mouth daily. , Disp: , Rfl:    atorvastatin (LIPITOR) 20 MG tablet, Take 20 mg by mouth every evening. , Disp: , Rfl:    clopidogrel (PLAVIX) 75 MG tablet, Take 75 mg by mouth daily. , Disp: , Rfl:    colchicine 0.6 MG tablet, Take 0.6 mg by mouth daily., Disp: , Rfl:    lidocaine-prilocaine (EMLA) cream, Apply 1 application topically as needed., Disp: 30 g, Rfl: 2   oxyCODONE (OXY IR/ROXICODONE) 5 MG immediate release tablet, TAKE 1 TABLET BY MOUTH EVERY 6 HOURS AS NEEDED FOR SEVERE PAIN, Disp: 45 tablet, Rfl: 0 No current facility-administered medications for this visit.  Facility-Administered Medications Ordered in Other Visits:    sodium chloride flush (NS) 0.9 % injection 10 mL, 10 mL, Intravenous, PRN, Sindy Guadeloupe, MD, 10 mL at 09/18/20 0850  Physical exam:  Vitals:   09/15/21 1122  BP: 140/75  Pulse: 78  Resp: 18  Temp: 98.7  F (37.1 C)  TempSrc: Tympanic  SpO2: 94%  Weight: 196 lb 6.4 oz (89.1 kg)  Height: 5\' 11"  (1.803 m)   Physical Exam Constitutional:      General: He is not in acute distress. Cardiovascular:     Rate and Rhythm: Normal rate and regular rhythm.     Heart sounds: Normal heart sounds.  Pulmonary:     Effort: Pulmonary effort is normal.     Breath sounds: Normal breath sounds.  Abdominal:     General: Bowel sounds are normal.     Palpations: Abdomen is soft.  Skin:    General: Skin is warm and dry.  Neurological:     Mental Status: He is alert and oriented to person, place, and time.     CMP Latest Ref Rng & Units 09/15/2021  Glucose 70 - 99 mg/dL 114(H)  BUN 8 - 23 mg/dL 27(H)  Creatinine 0.61 - 1.24 mg/dL 1.81(H)  Sodium 135 - 145 mmol/L 136  Potassium 3.5 - 5.1 mmol/L 3.4(L)  Chloride 98 - 111  mmol/L 106  CO2 22 - 32 mmol/L 25  Calcium 8.9 - 10.3 mg/dL 8.6(L)  Total Protein 6.5 - 8.1 g/dL 7.4  Total Bilirubin 0.3 - 1.2 mg/dL 0.5  Alkaline Phos 38 - 126 U/L 72  AST 15 - 41 U/L 14(L)  ALT 0 - 44 U/L 13   CBC Latest Ref Rng & Units 09/15/2021  WBC 4.0 - 10.5 K/uL 7.1  Hemoglobin 13.0 - 17.0 g/dL 15.4  Hematocrit 39.0 - 52.0 % 46.3  Platelets 150 - 400 K/uL 203    No images are attached to the encounter.  CT CHEST ABDOMEN PELVIS WO CONTRAST  Result Date: 09/07/2021 CLINICAL DATA:  Right lung cancer restaging. Prior radiation therapy and chemotherapy (treatment completed in January 2023). Also history of bladder surgery for bladder cancer. EXAM: CT CHEST, ABDOMEN AND PELVIS WITHOUT CONTRAST TECHNIQUE: Multidetector CT imaging of the chest, abdomen and pelvis was performed following the standard protocol without IV contrast. RADIATION DOSE REDUCTION: This exam was performed according to the departmental dose-optimization program which includes automated exposure control, adjustment of the mA and/or kV according to patient size and/or use of iterative reconstruction  technique. COMPARISON:  Multiple exams, including 06/11/2021 FINDINGS: CT CHEST FINDINGS Cardiovascular: My sense is that the left subclavian catheter may coil of into the internal jugular vein. The catheter terminates in the upper SVC, as before. Coronary, aortic arch, and branch vessel atherosclerotic vascular disease. Mediastinum/Nodes: Index right paratracheal node 0.6 cm in short axis on image 22 series 2, previously 0.8 cm by my measurement. Mild indistinctness of tissue planes along the right side of the mediastinum in the vicinity of the expected radiation port. No current pathologic adenopathy in the chest. Small type 1 hiatal hernia. Lungs/Pleura: Mostly similar right paramediastinal consolidation in the right upper lobe/right suprahilar region, with bandlike densities extending to the lung apex on the right likewise similar to previous. No new or progressive nodularity. Calcified granuloma anteriorly in the left upper lobe on image 60 series 3, unchanged. There is some scattered mild peripheral interstitial accentuation. Centrilobular emphysema. Musculoskeletal: Unremarkable CT ABDOMEN PELVIS FINDINGS Hepatobiliary: Contracted gallbladder. 8 mm gallstone within the gallbladder. Stable nonspecific 8 by 6 mm hypodense lesion in segment 3 of the liver, image 50 series 2, no change from 06/29/2016 hence highly likely to be benign and incidental. No biliary dilatation. Pancreas: Unremarkable Spleen: Unremarkable Adrenals/Urinary Tract: 2.3 by 1.8 cm hypodense left adrenal mass, internal density -3 Hounsfield units, compatible with adenoma. Low-density nodularity in the right adrenal gland, under 10 Hounsfield units, likewise probably small adenomas. 1.2 by 1.0 cm exophytic lesion from the left kidney upper pole, internal density 24 Hounsfield units precontrast, nonspecific for complex cyst versus mass (however this lesion has been present at least through 07/21/2010, hence is not felt to be an aggressive  process). No substantial hydronephrosis. Patient has had prior cystectomy. Ileal conduit noted. Stomach/Bowel: Unremarkable Vascular/Lymphatic: Atherosclerosis is present, including aortoiliac atherosclerotic disease. Left renal artery stent. Bilateral iliac stents. Femoral-femoral bypass graft. Atherosclerotic plaque in the branches of the abdominal aorta. Reproductive: Cystoprostatectomy. Other: No supplemental non-categorized findings. Musculoskeletal: Unremarkable IMPRESSION: 1. Stable post therapy related findings in the right lung, without findings of active recurrence or malignancy. 2. Probable chronic loop in the central venous catheter up into the internal jugular vein. The catheter terminates in the upper SVC. 3. Complex lesion of the left kidney upper pole is not substantially changed in size from 2011 hence is likely a benign process such as complex cyst. 4.  Other imaging findings of potential clinical significance: Aortic Atherosclerosis (ICD10-I70.0) and Emphysema (ICD10-J43.9). Substantial systemic atherosclerosis. Cholelithiasis. Small type 1 hiatal hernia. Scattered peripheral interstitial accentuation. In the lungs. Adrenal adenomas. Cystoprostatectomy with ileal conduit, no complicating feature. Electronically Signed   By: Van Clines M.D.   On: 09/07/2021 16:02     Assessment and plan- Patient is a 71 y.o. male with history of stage III adenocarcinoma of the lung s/p concurrent chemoradiation followed by maintenance durvalumab.  He is here to discuss CT scan results and further management  I have reviewed CT chest abdomen pelvis images independently and discussed findings with the patient which does not show any evidence of recurrent or progressive disease.He continues to have stable postradiation changes in the right upper lobe.  Complex lesion of the left kidney not significantly changed since 2011.  I will see him back in 4 months with a repeat scan.  Left hip pain: Unrelated to  malignancy.  I am unable to prescribe oxycodone long-term.  He will need to get in touch with Dr. Humphrey Rolls for long-term management of left hip pain as well as possible referral to orthopedics  Patient has a port in place which will be flushed every 3 months   Visit Diagnosis 1. Encounter for follow-up surveillance of lung cancer      Dr. Randa Evens, MD, MPH [facility] [phone] 09/15/2021 4:21 PM

## 2021-09-15 NOTE — Addendum Note (Signed)
Addended by: Luella Cook on: 09/15/2021 08:23 PM   Modules accepted: Orders

## 2021-09-22 DIAGNOSIS — I129 Hypertensive chronic kidney disease with stage 1 through stage 4 chronic kidney disease, or unspecified chronic kidney disease: Secondary | ICD-10-CM | POA: Diagnosis not present

## 2021-09-22 DIAGNOSIS — N189 Chronic kidney disease, unspecified: Secondary | ICD-10-CM | POA: Diagnosis not present

## 2021-09-22 DIAGNOSIS — E782 Mixed hyperlipidemia: Secondary | ICD-10-CM | POA: Diagnosis not present

## 2021-10-02 DIAGNOSIS — R7303 Prediabetes: Secondary | ICD-10-CM | POA: Diagnosis not present

## 2021-10-02 DIAGNOSIS — J309 Allergic rhinitis, unspecified: Secondary | ICD-10-CM | POA: Diagnosis not present

## 2021-10-02 DIAGNOSIS — I10 Essential (primary) hypertension: Secondary | ICD-10-CM | POA: Diagnosis not present

## 2021-10-02 DIAGNOSIS — F5101 Primary insomnia: Secondary | ICD-10-CM | POA: Diagnosis not present

## 2021-10-02 DIAGNOSIS — E782 Mixed hyperlipidemia: Secondary | ICD-10-CM | POA: Diagnosis not present

## 2021-10-21 ENCOUNTER — Encounter: Payer: Self-pay | Admitting: Oncology

## 2021-10-21 ENCOUNTER — Ambulatory Visit: Payer: Self-pay | Admitting: Urology

## 2021-11-05 ENCOUNTER — Encounter: Payer: Self-pay | Admitting: Urology

## 2021-11-05 ENCOUNTER — Ambulatory Visit (INDEPENDENT_AMBULATORY_CARE_PROVIDER_SITE_OTHER): Payer: Medicare Other | Admitting: Urology

## 2021-11-05 VITALS — BP 149/82 | HR 74 | Ht 71.0 in | Wt 191.0 lb

## 2021-11-05 DIAGNOSIS — N5232 Erectile dysfunction following radical cystectomy: Secondary | ICD-10-CM

## 2021-11-05 NOTE — Progress Notes (Signed)
? ?11/05/2021 ?12:25 PM  ? ?Daleen Bo Derosia ?1951/07/02 ?[id number] ? ?Referring provider: Perrin Maltese, MD ?[address] ?[place],  [place] [address] ? ?Chief Complaint  ?Patient presents with  ? Erectile Dysfunction  ? ? ?HPI: ?Martin Jennings is a 71 y.o. male referred for evaluation of erectile dysfunction. ? ?History pT0N0 bladder cancer s/p neoadjuvant chemotherapy and radical cystoprostatectomy with ileal conduit August 2012.  Complete response to neoadjuvant chemotherapy and no tumor identified in specimen ?Last saw Dr. Ricky Ala at [facility] in May 2018 and was released from routine monitoring ?Erectile dysfunction postoperatively not responding to PDE 5 inhibitors ?He has no significant erectile activity.  He has not tried second line options ? ? ?PMH: ?Past Medical History:  ?Diagnosis Date  ? Bladder cancer [facility]) 2012  ? COVID-19 virus infection 04/03/2020  ? Hypertension   ? Lung cancer ([place])   ? Peripheral vascular disease ([place])   ? ? ?Surgical History: ?Past Surgical History:  ?Procedure Laterality Date  ? BLADDER REMOVAL    ? LOWER EXTREMITY ANGIOGRAPHY Left 04/12/2017  ? Procedure: Lower Extremity Angiography;  Surgeon: Katha Cabal, MD;  Location: [place] [facility];  Service: Cardiovascular;  Laterality: Left;  ? PORTA CATH INSERTION N/A 05/22/2020  ? Procedure: PORTA CATH INSERTION;  Surgeon: Algernon Huxley, MD;  Location: [facility];  Service: Cardiovascular;  Laterality: N/A;  ? uretostomy    ? VASCULAR SURGERY    ? ? ?Home Medications:  ?Allergies as of 11/05/2021   ?No Known Allergies ?  ? ?  ?Medication List  ?  ? ?  ? Accurate as of November 05, 2021 12:25 PM. If you have any questions, ask your nurse or doctor.  ?  ?  ? ?  ? ?amLODipine 10 MG tablet ?Commonly known as: NORVASC ?Take 10 mg by mouth daily. ?  ?atorvastatin 20 MG tablet ?Commonly known as: LIPITOR ?Take 20 mg by mouth every evening. ?  ?clopidogrel 75 MG tablet ?Commonly known as: PLAVIX ?Take 75 mg by mouth daily. ?   ?colchicine 0.6 MG tablet ?Take 0.6 mg by mouth daily. ?  ?lidocaine-prilocaine cream ?Commonly known as: EMLA ?Apply 1 application topically as needed. ?  ?oxyCODONE 5 MG immediate release tablet ?Commonly known as: Oxy IR/ROXICODONE ?TAKE 1 TABLET BY MOUTH EVERY 6 HOURS AS NEEDED FOR SEVERE PAIN ?  ? ?  ? ? ?Allergies: No Known Allergies ? ?Family History: ?No family history on file. ? ?Social History:  reports that he has been smoking cigarettes. He has a 25.00 pack-year smoking history. He has never used smokeless tobacco. He reports that he does not currently use drugs after having used the following drugs: Marijuana. He reports that he does not drink alcohol. ? ? ?Physical Exam: ?BP (!) 149/82   Pulse 74   Ht 5\' 11"  (1.803 m)   Wt 191 lb (86.6 kg)   BMI 26.64 kg/m?   ?Constitutional:  Alert and oriented, No acute distress. ?HEENT: [place] AT, moist mucus membranes.  Trachea midline, no masses. ?Respiratory: Normal respiratory effort, no increased work of breathing. ?Psychiatric: Normal mood and affect. ? ? ?Assessment & Plan:   ? ?1.  Post cystoprostatectomy erectile dysfunction ?PDE 5 inhibitor refractory ?We discussed the neurogenic etiology post prostatectomy ?Second line options were reviewed including intracavernosal injections, vacuum erection devices and penile implant surgery ?He was provided literature on these options ?Side effects of intracavernosal injections were discussed including priapism and rarely corporal scarring.  If he  is interested in intracavernosal injections he will call back and schedule an injection training appointment ? ? ?Abbie Sons, MD ? ?[place] ?[address] ?[place], [place] [address] ?[phone] ? ?

## 2021-12-03 DIAGNOSIS — Z936 Other artificial openings of urinary tract status: Secondary | ICD-10-CM | POA: Diagnosis not present

## 2021-12-15 ENCOUNTER — Inpatient Hospital Stay: Payer: Medicare Other | Attending: Oncology

## 2021-12-15 DIAGNOSIS — Z452 Encounter for adjustment and management of vascular access device: Secondary | ICD-10-CM | POA: Insufficient documentation

## 2021-12-15 DIAGNOSIS — Z85118 Personal history of other malignant neoplasm of bronchus and lung: Secondary | ICD-10-CM | POA: Insufficient documentation

## 2021-12-15 DIAGNOSIS — Z95828 Presence of other vascular implants and grafts: Secondary | ICD-10-CM

## 2021-12-15 MED ORDER — SODIUM CHLORIDE 0.9% FLUSH
10.0000 mL | Freq: Once | INTRAVENOUS | Status: AC
  Administered 2021-12-15: 10 mL via INTRAVENOUS
  Filled 2021-12-15: qty 10

## 2021-12-15 MED ORDER — HEPARIN SOD (PORK) LOCK FLUSH 100 UNIT/ML IV SOLN
500.0000 [IU] | Freq: Once | INTRAVENOUS | Status: AC
  Administered 2021-12-15: 500 [IU] via INTRAVENOUS
  Filled 2021-12-15: qty 5

## 2021-12-23 DIAGNOSIS — E782 Mixed hyperlipidemia: Secondary | ICD-10-CM | POA: Diagnosis not present

## 2021-12-23 DIAGNOSIS — N189 Chronic kidney disease, unspecified: Secondary | ICD-10-CM | POA: Diagnosis not present

## 2021-12-23 DIAGNOSIS — I129 Hypertensive chronic kidney disease with stage 1 through stage 4 chronic kidney disease, or unspecified chronic kidney disease: Secondary | ICD-10-CM | POA: Diagnosis not present

## 2021-12-24 IMAGING — NM NM BONE WHOLE BODY
2 series · 10 of 10 positions shown · non-contrast
Comparison: CT chest, abdomen and pelvis 11/03/2020.

CLINICAL DATA: History of lung and bladder carcinoma. Left hip
pain.

EXAM:
NUCLEAR MEDICINE WHOLE BODY BONE SCAN
TECHNIQUE: Whole body anterior and posterior images were obtained approximately
3 hours after intravenous injection of radiopharmaceutical.
RADIOPHARMACEUTICALS:  21.1 mCi Eechnetium-77m MDP IV

[Series 1000: statics · 2.40mm/px · 4 acquisitions, 8 frames shown]
[im 1/4]
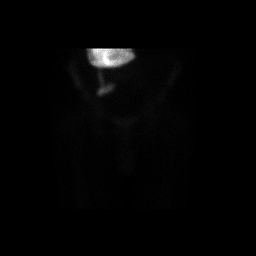
[im 1/4]
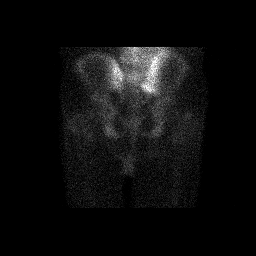
[im 2/4]
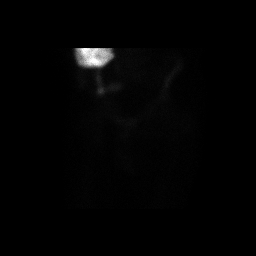
[im 2/4]
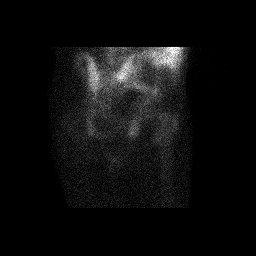
[im 3/4]
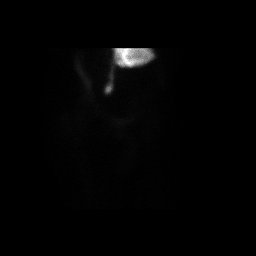
[im 3/4]
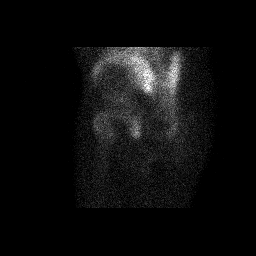
[im 4/4]
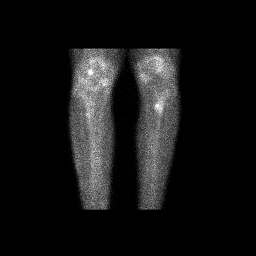
[im 4/4]
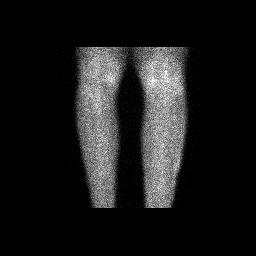

[Series 1000: 3 hr wholebody · 2.40mm/px · 2 of 2 frames shown]
[frame 1/2]
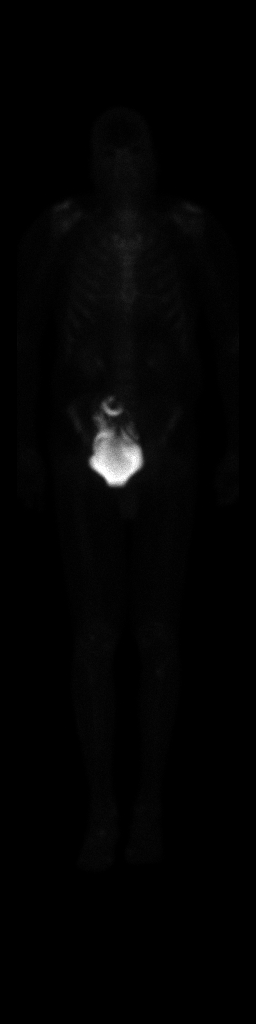
[frame 2/2]
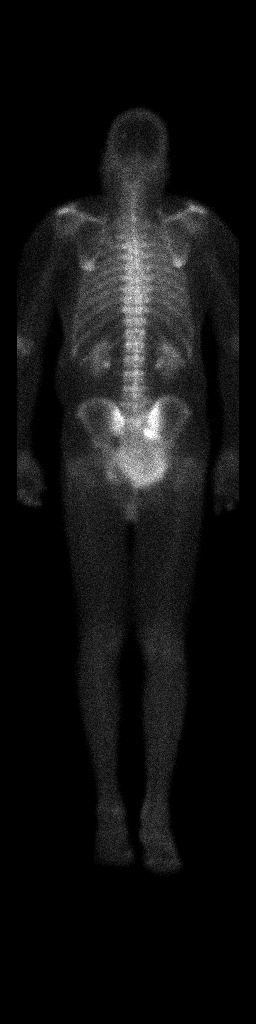

[10 of 10 positions shown; findings below may reference images not displayed]

FINDINGS: No osseous uptake to suggest metastatic disease identified. There is
scattered degenerative change about the feet and knee. Small foci of
increased uptake in the midline of the proximal right and left tibia
is seen. Soft tissue uptake is negative.
IMPRESSION: Negative for metastatic disease.

Punctate foci of increased uptake in the proximal right and left
tibia may be due to old Osgood-Schlatter disease or possibly stress
change.

## 2022-01-05 DIAGNOSIS — M109 Gout, unspecified: Secondary | ICD-10-CM | POA: Diagnosis not present

## 2022-01-05 DIAGNOSIS — I129 Hypertensive chronic kidney disease with stage 1 through stage 4 chronic kidney disease, or unspecified chronic kidney disease: Secondary | ICD-10-CM | POA: Diagnosis not present

## 2022-01-05 DIAGNOSIS — J449 Chronic obstructive pulmonary disease, unspecified: Secondary | ICD-10-CM | POA: Diagnosis not present

## 2022-01-05 DIAGNOSIS — E559 Vitamin D deficiency, unspecified: Secondary | ICD-10-CM | POA: Diagnosis not present

## 2022-01-05 DIAGNOSIS — R0600 Dyspnea, unspecified: Secondary | ICD-10-CM | POA: Diagnosis not present

## 2022-01-05 DIAGNOSIS — I10 Essential (primary) hypertension: Secondary | ICD-10-CM | POA: Diagnosis not present

## 2022-01-05 DIAGNOSIS — D519 Vitamin B12 deficiency anemia, unspecified: Secondary | ICD-10-CM | POA: Diagnosis not present

## 2022-01-05 DIAGNOSIS — N189 Chronic kidney disease, unspecified: Secondary | ICD-10-CM | POA: Diagnosis not present

## 2022-01-05 DIAGNOSIS — E782 Mixed hyperlipidemia: Secondary | ICD-10-CM | POA: Diagnosis not present

## 2022-01-05 DIAGNOSIS — F5101 Primary insomnia: Secondary | ICD-10-CM | POA: Diagnosis not present

## 2022-01-05 DIAGNOSIS — R7303 Prediabetes: Secondary | ICD-10-CM | POA: Diagnosis not present

## 2022-01-05 DIAGNOSIS — I739 Peripheral vascular disease, unspecified: Secondary | ICD-10-CM | POA: Diagnosis not present

## 2022-01-06 ENCOUNTER — Ambulatory Visit
Admission: RE | Admit: 2022-01-06 | Discharge: 2022-01-06 | Disposition: A | Discharge status: Home / Self Care | Payer: Medicare Other | Source: Ambulatory Visit | Attending: Oncology | Admitting: Oncology

## 2022-01-06 DIAGNOSIS — K8020 Calculus of gallbladder without cholecystitis without obstruction: Secondary | ICD-10-CM | POA: Insufficient documentation

## 2022-01-06 DIAGNOSIS — Z08 Encounter for follow-up examination after completed treatment for malignant neoplasm: Secondary | ICD-10-CM | POA: Insufficient documentation

## 2022-01-06 DIAGNOSIS — Z9221 Personal history of antineoplastic chemotherapy: Secondary | ICD-10-CM | POA: Diagnosis not present

## 2022-01-06 DIAGNOSIS — M25552 Pain in left hip: Secondary | ICD-10-CM | POA: Diagnosis not present

## 2022-01-06 DIAGNOSIS — C3491 Malignant neoplasm of unspecified part of right bronchus or lung: Secondary | ICD-10-CM | POA: Diagnosis not present

## 2022-01-06 DIAGNOSIS — Z8551 Personal history of malignant neoplasm of bladder: Secondary | ICD-10-CM | POA: Insufficient documentation

## 2022-01-06 DIAGNOSIS — I700 Atherosclerosis of aorta: Secondary | ICD-10-CM | POA: Diagnosis not present

## 2022-01-06 DIAGNOSIS — Z95820 Peripheral vascular angioplasty status with implants and grafts: Secondary | ICD-10-CM | POA: Diagnosis not present

## 2022-01-06 DIAGNOSIS — J439 Emphysema, unspecified: Secondary | ICD-10-CM | POA: Diagnosis not present

## 2022-01-06 DIAGNOSIS — Z85118 Personal history of other malignant neoplasm of bronchus and lung: Secondary | ICD-10-CM | POA: Diagnosis not present

## 2022-01-06 DIAGNOSIS — I739 Peripheral vascular disease, unspecified: Secondary | ICD-10-CM | POA: Diagnosis not present

## 2022-01-06 DIAGNOSIS — J929 Pleural plaque without asbestos: Secondary | ICD-10-CM | POA: Diagnosis not present

## 2022-01-06 DIAGNOSIS — Z9889 Other specified postprocedural states: Secondary | ICD-10-CM | POA: Insufficient documentation

## 2022-01-06 DIAGNOSIS — Z923 Personal history of irradiation: Secondary | ICD-10-CM | POA: Diagnosis not present

## 2022-01-06 DIAGNOSIS — Z906 Acquired absence of other parts of urinary tract: Secondary | ICD-10-CM | POA: Insufficient documentation

## 2022-01-06 DIAGNOSIS — E279 Disorder of adrenal gland, unspecified: Secondary | ICD-10-CM | POA: Insufficient documentation

## 2022-01-06 DIAGNOSIS — N281 Cyst of kidney, acquired: Secondary | ICD-10-CM | POA: Diagnosis not present

## 2022-01-13 ENCOUNTER — Inpatient Hospital Stay (HOSPITAL_BASED_OUTPATIENT_CLINIC_OR_DEPARTMENT_OTHER): Payer: Medicare Other | Admitting: Oncology

## 2022-01-13 ENCOUNTER — Inpatient Hospital Stay: Payer: Medicare Other | Attending: Oncology

## 2022-01-13 ENCOUNTER — Encounter: Payer: Self-pay | Admitting: Oncology

## 2022-01-13 ENCOUNTER — Other Ambulatory Visit: Payer: Self-pay

## 2022-01-13 VITALS — BP 137/82 | HR 72 | Temp 97.0°F | Resp 18 | Wt 193.0 lb

## 2022-01-13 DIAGNOSIS — Z85118 Personal history of other malignant neoplasm of bronchus and lung: Secondary | ICD-10-CM | POA: Diagnosis not present

## 2022-01-13 DIAGNOSIS — F17210 Nicotine dependence, cigarettes, uncomplicated: Secondary | ICD-10-CM | POA: Diagnosis not present

## 2022-01-13 DIAGNOSIS — Z923 Personal history of irradiation: Secondary | ICD-10-CM | POA: Insufficient documentation

## 2022-01-13 DIAGNOSIS — Z9221 Personal history of antineoplastic chemotherapy: Secondary | ICD-10-CM | POA: Insufficient documentation

## 2022-01-13 DIAGNOSIS — Z08 Encounter for follow-up examination after completed treatment for malignant neoplasm: Secondary | ICD-10-CM

## 2022-01-13 DIAGNOSIS — Z79899 Other long term (current) drug therapy: Secondary | ICD-10-CM | POA: Insufficient documentation

## 2022-01-13 DIAGNOSIS — Z8551 Personal history of malignant neoplasm of bladder: Secondary | ICD-10-CM | POA: Insufficient documentation

## 2022-01-13 DIAGNOSIS — R21 Rash and other nonspecific skin eruption: Secondary | ICD-10-CM | POA: Diagnosis not present

## 2022-01-13 LAB — CBC WITH DIFFERENTIAL/PLATELET
Abs Immature Granulocytes: 0.02 10*3/uL (ref 0.00–0.07)
Basophils Absolute: 0.1 10*3/uL (ref 0.0–0.1)
Basophils Relative: 1 %
Eosinophils Absolute: 0.5 10*3/uL (ref 0.0–0.5)
Eosinophils Relative: 8 %
HCT: 50.4 % (ref 39.0–52.0)
Hemoglobin: 16.5 g/dL (ref 13.0–17.0)
Immature Granulocytes: 0 %
Lymphocytes Relative: 20 %
Lymphs Abs: 1.1 10*3/uL (ref 0.7–4.0)
MCH: 31.7 pg (ref 26.0–34.0)
MCHC: 32.7 g/dL (ref 30.0–36.0)
MCV: 96.9 fL (ref 80.0–100.0)
Monocytes Absolute: 0.5 10*3/uL (ref 0.1–1.0)
Monocytes Relative: 8 %
Neutro Abs: 3.7 10*3/uL (ref 1.7–7.7)
Neutrophils Relative %: 63 %
Platelets: 161 10*3/uL (ref 150–400)
RBC: 5.2 MIL/uL (ref 4.22–5.81)
RDW: 13.2 % (ref 11.5–15.5)
WBC: 5.8 10*3/uL (ref 4.0–10.5)
nRBC: 0 % (ref 0.0–0.2)

## 2022-01-13 LAB — COMPREHENSIVE METABOLIC PANEL
ALT: 12 U/L (ref 0–44)
AST: 15 U/L (ref 15–41)
Albumin: 3.7 g/dL (ref 3.5–5.0)
Alkaline Phosphatase: 77 U/L (ref 38–126)
Anion gap: 7 (ref 5–15)
BUN: 25 mg/dL — ABNORMAL HIGH (ref 8–23)
CO2: 25 mmol/L (ref 22–32)
Calcium: 9 mg/dL (ref 8.9–10.3)
Chloride: 106 mmol/L (ref 98–111)
Creatinine, Ser: 1.97 mg/dL — ABNORMAL HIGH (ref 0.61–1.24)
GFR, Estimated: 36 mL/min — ABNORMAL LOW (ref >60)
Glucose, Bld: 93 mg/dL (ref 70–99)
Potassium: 4.3 mmol/L (ref 3.5–5.1)
Sodium: 138 mmol/L (ref 135–145)
Total Bilirubin: 0.2 mg/dL — ABNORMAL LOW (ref 0.3–1.2)
Total Protein: 7.3 g/dL (ref 6.5–8.1)

## 2022-01-13 NOTE — Progress Notes (Signed)
Hematology/Oncology Consult note [facility]  Telephone:([phone] Fax:([phone]  Patient Care Team: Perrin Maltese, MD as PCP - General (Internal Medicine) Telford Nab, RN as Oncology Nurse Navigator Sindy Guadeloupe, MD as Consulting Physician (Hematology and Oncology) Noreene Filbert, MD as Consulting Physician (Radiation Oncology)   Name of the patient: Martin Jennings  [id number]  [birth year]   Date of visit: 01/13/22  Diagnosis- stage IIIc adenocarcinoma of the lung cT3 cN3 M0  Chief complaint/ Reason for visit-discuss CT scan results and further management  Heme/Onc history: Patient is a 71 year old male with a history of tobacco dependence who was admitted to the hospital for symptoms of acute onset shortness of breath and was found to have Covid pneumonia.  He underwent CT angio chest which showed extensive mediastinal as well as bilateral hilar adenopathy.  Masslike opacity with architectural distortion involving the right upper lobe measuring 5.7 x 2.9 x 2.3 cm and groundglass opacities bilaterally.  Mass involving the left adrenal gland as well as a smaller right adrenal gland possibly indicating adrenal metastases.     This was followed by a PET CT scan 2 weeks later which showed the consolidative process in the right upper lobe suv 7.88 concerning for infiltrating tumor.  It appears to involve the pleura associated interstitial thickening was also seen.  Bulky right hilar and mediastinal adenopathy.  Enlarged somewhat necrotic appearing left supraclavicular lymph node was also hypermetabolic.  Adrenal gland was not hypermetabolic and was consistent with a benign adenoma.  No metastatic disease involving liver.  No features of distant metastatic disease.   Pathology showed metastatic non-small cell lung cancer which was TTF-1 positive consistent with adenocarcinoma   Patient finished concurrent chemoradiation with weekly CarboTaxol.  Patient  completed a year of maintenance durvalumab in early January 2023 with stable disease.    Interval history-patient reports developing a skin rash over his bilateral forearms and abdominal wall about 2 weeks ago which is gradually healing.  Reports that presently his rash does not bother him and it is not pruritic.  He has some ongoing cough with clear sputum production.  He has not had any fevers  ECOG PS- 1 Pain scale-0  Review of systems- Review of Systems  Constitutional:  Negative for chills, fever, malaise/fatigue and weight loss.  HENT:  Negative for congestion, ear discharge and nosebleeds.   Eyes:  Negative for blurred vision.  Respiratory:  Positive for cough. Negative for hemoptysis, sputum production, shortness of breath and wheezing.   Cardiovascular:  Negative for chest pain, palpitations, orthopnea and claudication.  Gastrointestinal:  Negative for abdominal pain, blood in stool, constipation, diarrhea, heartburn, melena, nausea and vomiting.  Genitourinary:  Negative for dysuria, flank pain, frequency, hematuria and urgency.  Musculoskeletal:  Negative for back pain, joint pain and myalgias.  Skin:  Positive for rash.  Neurological:  Negative for dizziness, tingling, focal weakness, seizures, weakness and headaches.  Endo/Heme/Allergies:  Does not bruise/bleed easily.  Psychiatric/Behavioral:  Negative for depression and suicidal ideas. The patient does not have insomnia.       No Known Allergies   Past Medical History:  Diagnosis Date   Bladder cancer ([place]) 2012   COVID-19 virus infection 04/03/2020   Hypertension    Lung cancer ([place])    Peripheral vascular disease [facility])      Past Surgical History:  Procedure Laterality Date   BLADDER REMOVAL     LOWER EXTREMITY ANGIOGRAPHY Left 04/12/2017   Procedure: Lower Extremity  Angiography;  Surgeon: Katha Cabal, MD;  Location: [place] [facility];  Service: Cardiovascular;  Laterality: Left;   PORTA CATH INSERTION  N/A 05/22/2020   Procedure: PORTA CATH INSERTION;  Surgeon: Algernon Huxley, MD;  Location: [place] [facility];  Service: Cardiovascular;  Laterality: N/A;   uretostomy     VASCULAR SURGERY      Social History   Socioeconomic History   Marital status: Married    Spouse name: Not on file   Number of children: Not on file   Years of education: Not on file   Highest education level: Not on file  Occupational History   Not on file  Tobacco Use   Smoking status: Some Days    Packs/day: 0.50    Years: 50.00    Total pack years: 25.00    Types: Cigarettes   Smokeless tobacco: Never   Tobacco comments:    Per pt. He smokes 1 pack every 3 days  Vaping Use   Vaping Use: Never used  Substance and Sexual Activity   Alcohol use: No   Drug use: Not Currently    Types: Marijuana   Sexual activity: Not Currently  Other Topics Concern   Not on file  Social History Narrative   Not on file   Social Determinants of Health   Financial Resource Strain: Not on file  Food Insecurity: Not on file  Transportation Needs: Not on file  Physical Activity: Not on file  Stress: Not on file  Social Connections: Not on file  Intimate Partner Violence: Not on file    History reviewed. No pertinent family history.   Current Outpatient Medications:    amLODipine (NORVASC) 10 MG tablet, Take 10 mg by mouth daily. , Disp: , Rfl:    atorvastatin (LIPITOR) 20 MG tablet, Take 20 mg by mouth every evening. , Disp: , Rfl:    clopidogrel (PLAVIX) 75 MG tablet, Take 75 mg by mouth daily. , Disp: , Rfl:    colchicine 0.6 MG tablet, Take 0.6 mg by mouth daily., Disp: , Rfl:    fexofenadine (ALLEGRA) 180 MG tablet, Take 180 mg by mouth daily as needed., Disp: , Rfl:    oxyCODONE (OXY IR/ROXICODONE) 5 MG immediate release tablet, TAKE 1 TABLET BY MOUTH EVERY 6 HOURS AS NEEDED FOR SEVERE PAIN, Disp: 45 tablet, Rfl: 0   lidocaine-prilocaine (EMLA) cream, Apply 1 application topically as needed. (Patient not  taking: Reported on 01/13/2022), Disp: 30 g, Rfl: 2   traZODone (DESYREL) 50 MG tablet, traZODone HCl 50 MG Oral Tablet QTY: 30 tablet Days: 30 Refills: 0  Written: 10/09/21 Patient Instructions: take 1 tablet by mouth nightly as needed for insomnia (Patient not taking: Reported on 01/13/2022), Disp: , Rfl:    zaleplon (SONATA) 5 MG capsule, Take 5 mg by mouth at bedtime as needed. (Patient not taking: Reported on 01/13/2022), Disp: , Rfl:  No current facility-administered medications for this visit.  Facility-Administered Medications Ordered in Other Visits:    sodium chloride flush (NS) 0.9 % injection 10 mL, 10 mL, Intravenous, PRN, Sindy Guadeloupe, MD, 10 mL at 09/18/20 0850  Physical exam:  Vitals:   01/13/22 1025  BP: 137/82  Pulse: 72  Resp: 18  Temp: (!) 97 F (36.1 C)  TempSrc: Tympanic  SpO2: 98%  Weight: 193 lb (87.5 kg)   Physical Exam Constitutional:      General: He is not in acute distress. Cardiovascular:     Rate and Rhythm:  Normal rate and regular rhythm.     Heart sounds: Normal heart sounds.  Pulmonary:     Effort: Pulmonary effort is normal.     Breath sounds: Normal breath sounds.  Abdominal:     General: Bowel sounds are normal.     Palpations: Abdomen is soft.  Skin:    General: Skin is warm and dry.     Comments: Scattered circular discrete lesions noted over anterior abdominal wall which appear to be healing presently with residual hyperpigmentation.  Similar areas noted on bilateral forearms as well.  Neurological:     Mental Status: He is alert and oriented to person, place, and time.         Latest Ref Rng & Units 01/13/2022    9:36 AM  CMP  Glucose 70 - 99 mg/dL 93   BUN 8 - 23 mg/dL 25   Creatinine 0.61 - 1.24 mg/dL 1.97   Sodium 135 - 145 mmol/L 138   Potassium 3.5 - 5.1 mmol/L 4.3   Chloride 98 - 111 mmol/L 106   CO2 22 - 32 mmol/L 25   Calcium 8.9 - 10.3 mg/dL 9.0   Total Protein 6.5 - 8.1 g/dL 7.3   Total Bilirubin 0.3 - 1.2 mg/dL 0.2    Alkaline Phos 38 - 126 U/L 77   AST 15 - 41 U/L 15   ALT 0 - 44 U/L 12       Latest Ref Rng & Units 01/13/2022    9:36 AM  CBC  WBC 4.0 - 10.5 K/uL 5.8   Hemoglobin 13.0 - 17.0 g/dL 16.5   Hematocrit 39.0 - 52.0 % 50.4   Platelets 150 - 400 K/uL 161     No images are attached to the encounter.  CT CHEST ABDOMEN PELVIS WO CONTRAST  Result Date: 01/07/2022 CLINICAL DATA:  Restaging of RIGHT-sided lung cancer in a 71 year old male. * Tracking Code: BO * EXAM: CT CHEST, ABDOMEN AND PELVIS WITHOUT CONTRAST TECHNIQUE: Multidetector CT imaging of the chest, abdomen and pelvis was performed following the standard protocol without IV contrast. RADIATION DOSE REDUCTION: This exam was performed according to the departmental dose-optimization program which includes automated exposure control, adjustment of the mA and/or kV according to patient size and/or use of iterative reconstruction technique. COMPARISON:  September 07, 2021. FINDINGS: CT CHEST FINDINGS Cardiovascular: Calcified aortic atherosclerosis and signs of calcified coronary artery disease. Stable heart size. Stable aortic contour. Redundant loop of catheter for RIGHT-sided Port-A-Cath is again noted with stable position of the Port-A-Cath in the proximal superior vena cava. Mediastinum/Nodes: No signs of mediastinal adenopathy. No axillary lymphadenopathy. No supraclavicular or thoracic inlet lymphadenopathy. Fullness about the RIGHT hilum following post treatment changes is stable. Thickening of the esophagus is long segment and related to prior radiotherapy without change. Lungs/Pleura: Stable post treatment related changes in the RIGHT upper lobe extending from RIGHT suprahilar upper lobe along the medial aspect of the RIGHT upper lobe and mediastinal border extending towards RIGHT lung apex without change. Areas of septal thickening elsewhere in the chest compatible with chronic interstitial changes also stable. No consolidation. No sign of  pleural effusion. Musculoskeletal: See below for full musculoskeletal details. CT ABDOMEN PELVIS FINDINGS Hepatobiliary: Smooth hepatic contours. No focal, suspicious hepatic lesion on noncontrast imaging. No pericholecystic stranding with gallstone in the dependent gallbladder. No gross biliary duct distension. Pancreas: Normal contour without signs of inflammation. Spleen: Normal. Adrenals/Urinary Tract: Stable 2.5 cm LEFT adrenal lesion with low-density and nodularity of the adrenal  glands bilaterally elsewhere without change. Mild perinephric stranding bilaterally. Small cyst in the upper pole the LEFT kidney is stable and has been present since 2011 and requires no dedicated follow-up. Mild cortical loss on the LEFT. No nephrolithiasis or hydronephrosis. Postoperative changes of cystectomy and urinary diversion with ileal conduit. Ostomy over the RIGHT lower quadrant. Stomach/Bowel: Signs of partial small bowel resection for creation of ileal conduit without change or without acute process. The appendix is normal. No signs of colonic thickening or acute colonic findings. Vascular/Lymphatic: Vascular disease with signs of bilateral common iliac arterial stenting and LEFT renal artery stenting. No aneurysmal dilation of the abdominal aorta. No adenopathy in the retroperitoneum or upper abdomen. Post lymphadenectomy in the pelvis related to cysto prostatectomy. Femoral femoral bypass grafting with similar appearance of postoperative changes about the LEFT and RIGHT groin. Reproductive: Post prostatectomy. Other: No ascites. Musculoskeletal: No acute bone finding. No destructive bone process. Spinal degenerative changes. IMPRESSION: 1. Stable post treatment changes in the RIGHT chest. 2. Chronic interstitial thickening elsewhere in the chest compatible with interstitial lung disease, associated with pulmonary emphysema. 3. No evidence of metastatic or recurrent disease. 4. Stable low-density LEFT adrenal lesion  compatible with adrenal adenoma for which no dedicated follow-up is recommended. 5. Postoperative changes of cystectomy and urinary diversion with ileal conduit. 6. Cholelithiasis. 7. Vascular disease with femoral femoral bypass grafting and signs of vascular stenting Aortic Atherosclerosis (ICD10-I70.0). Electronically Signed   By: Zetta Bills M.D.   On: 01/07/2022 16:06     Assessment and plan- Patient is a 71 y.o. male with history of stage III adenocarcinoma of the lung s/p concurrent chemoradiation followed by maintenance durvalumab.  I have reviewed CT chest abdomen pelvis images independently and discussed findings with the patient which does not show any evidence of recurrent or progressive disease.He has baseline changes of chronic lung disease but there was no overt consolidation or inflammation seen.  I am therefore holding off on giving him any antibiotics in the absence of fever or concerning radiological findings for his cough.  I will see him back in 4 months with labs prior and scans prior.   Visit Diagnosis 1. Encounter for follow-up surveillance of lung cancer      Dr. Randa Evens, MD, MPH [facility] at [facility] [phone] 01/13/2022 1:57 PM

## 2022-01-13 NOTE — Progress Notes (Signed)
Patient reports and cough/mucus

## 2022-02-15 ENCOUNTER — Other Ambulatory Visit: Payer: Self-pay

## 2022-03-23 DIAGNOSIS — Z936 Other artificial openings of urinary tract status: Secondary | ICD-10-CM | POA: Diagnosis not present

## 2022-04-06 DIAGNOSIS — I10 Essential (primary) hypertension: Secondary | ICD-10-CM | POA: Diagnosis not present

## 2022-04-06 DIAGNOSIS — Z1211 Encounter for screening for malignant neoplasm of colon: Secondary | ICD-10-CM | POA: Diagnosis not present

## 2022-04-06 DIAGNOSIS — Z0000 Encounter for general adult medical examination without abnormal findings: Secondary | ICD-10-CM | POA: Diagnosis not present

## 2022-04-06 DIAGNOSIS — N189 Chronic kidney disease, unspecified: Secondary | ICD-10-CM | POA: Diagnosis not present

## 2022-04-06 DIAGNOSIS — R7303 Prediabetes: Secondary | ICD-10-CM | POA: Diagnosis not present

## 2022-04-06 DIAGNOSIS — I739 Peripheral vascular disease, unspecified: Secondary | ICD-10-CM | POA: Diagnosis not present

## 2022-04-06 DIAGNOSIS — M109 Gout, unspecified: Secondary | ICD-10-CM | POA: Diagnosis not present

## 2022-04-06 DIAGNOSIS — E559 Vitamin D deficiency, unspecified: Secondary | ICD-10-CM | POA: Diagnosis not present

## 2022-04-06 DIAGNOSIS — R7301 Impaired fasting glucose: Secondary | ICD-10-CM | POA: Diagnosis not present

## 2022-04-15 ENCOUNTER — Inpatient Hospital Stay: Payer: Medicare Other | Attending: Oncology

## 2022-04-15 DIAGNOSIS — Z85118 Personal history of other malignant neoplasm of bronchus and lung: Secondary | ICD-10-CM | POA: Insufficient documentation

## 2022-04-15 DIAGNOSIS — Z9221 Personal history of antineoplastic chemotherapy: Secondary | ICD-10-CM | POA: Diagnosis not present

## 2022-04-15 DIAGNOSIS — Z923 Personal history of irradiation: Secondary | ICD-10-CM | POA: Diagnosis not present

## 2022-04-15 DIAGNOSIS — Z452 Encounter for adjustment and management of vascular access device: Secondary | ICD-10-CM | POA: Insufficient documentation

## 2022-04-15 DIAGNOSIS — F17210 Nicotine dependence, cigarettes, uncomplicated: Secondary | ICD-10-CM | POA: Insufficient documentation

## 2022-04-15 DIAGNOSIS — Z95828 Presence of other vascular implants and grafts: Secondary | ICD-10-CM

## 2022-04-15 MED ORDER — SODIUM CHLORIDE 0.9% FLUSH
10.0000 mL | Freq: Once | INTRAVENOUS | Status: AC
  Administered 2022-04-15: 10 mL via INTRAVENOUS
  Filled 2022-04-15: qty 10

## 2022-04-15 MED ORDER — HEPARIN SOD (PORK) LOCK FLUSH 100 UNIT/ML IV SOLN
500.0000 [IU] | Freq: Once | INTRAVENOUS | Status: AC
  Administered 2022-04-15: 500 [IU] via INTRAVENOUS
  Filled 2022-04-15: qty 5

## 2022-04-24 DIAGNOSIS — E782 Mixed hyperlipidemia: Secondary | ICD-10-CM | POA: Diagnosis not present

## 2022-04-24 DIAGNOSIS — I129 Hypertensive chronic kidney disease with stage 1 through stage 4 chronic kidney disease, or unspecified chronic kidney disease: Secondary | ICD-10-CM | POA: Diagnosis not present

## 2022-04-24 DIAGNOSIS — N189 Chronic kidney disease, unspecified: Secondary | ICD-10-CM | POA: Diagnosis not present

## 2022-05-03 ENCOUNTER — Ambulatory Visit: Payer: Medicare Other

## 2022-05-04 ENCOUNTER — Ambulatory Visit
Admission: RE | Admit: 2022-05-04 | Discharge: 2022-05-04 | Disposition: A | Discharge status: Home / Self Care | Payer: Medicare Other | Source: Ambulatory Visit | Attending: Oncology | Admitting: Oncology

## 2022-05-04 DIAGNOSIS — J984 Other disorders of lung: Secondary | ICD-10-CM | POA: Diagnosis not present

## 2022-05-04 DIAGNOSIS — K8020 Calculus of gallbladder without cholecystitis without obstruction: Secondary | ICD-10-CM | POA: Diagnosis not present

## 2022-05-04 DIAGNOSIS — I700 Atherosclerosis of aorta: Secondary | ICD-10-CM | POA: Insufficient documentation

## 2022-05-04 DIAGNOSIS — K7689 Other specified diseases of liver: Secondary | ICD-10-CM | POA: Diagnosis not present

## 2022-05-04 DIAGNOSIS — Z08 Encounter for follow-up examination after completed treatment for malignant neoplasm: Secondary | ICD-10-CM | POA: Insufficient documentation

## 2022-05-04 DIAGNOSIS — N2889 Other specified disorders of kidney and ureter: Secondary | ICD-10-CM | POA: Diagnosis not present

## 2022-05-04 DIAGNOSIS — Z85118 Personal history of other malignant neoplasm of bronchus and lung: Secondary | ICD-10-CM | POA: Diagnosis not present

## 2022-05-04 DIAGNOSIS — J439 Emphysema, unspecified: Secondary | ICD-10-CM | POA: Insufficient documentation

## 2022-05-04 DIAGNOSIS — J849 Interstitial pulmonary disease, unspecified: Secondary | ICD-10-CM | POA: Diagnosis not present

## 2022-05-04 DIAGNOSIS — I2510 Atherosclerotic heart disease of native coronary artery without angina pectoris: Secondary | ICD-10-CM | POA: Diagnosis not present

## 2022-05-04 DIAGNOSIS — C3411 Malignant neoplasm of upper lobe, right bronchus or lung: Secondary | ICD-10-CM | POA: Diagnosis not present

## 2022-05-04 LAB — POCT I-STAT CREATININE: Creatinine, Ser: 1.1 mg/dL (ref 0.61–1.24)

## 2022-05-04 MED ORDER — IOHEXOL 300 MG/ML  SOLN
100.0000 mL | Freq: Once | INTRAMUSCULAR | Status: AC | PRN
  Administered 2022-05-04: 100 mL via INTRAVENOUS

## 2022-05-04 MED ORDER — HEPARIN SOD (PORK) LOCK FLUSH 100 UNIT/ML IV SOLN
500.0000 [IU] | Freq: Once | INTRAVENOUS | Status: AC
  Administered 2022-05-04: 500 [IU] via INTRAVENOUS

## 2022-05-07 ENCOUNTER — Ambulatory Visit: Payer: Medicare Other | Admitting: Oncology

## 2022-05-07 ENCOUNTER — Other Ambulatory Visit: Payer: Medicare Other

## 2022-05-11 ENCOUNTER — Inpatient Hospital Stay: Payer: Medicare Other | Attending: Oncology

## 2022-05-11 ENCOUNTER — Inpatient Hospital Stay (HOSPITAL_BASED_OUTPATIENT_CLINIC_OR_DEPARTMENT_OTHER): Payer: Medicare Other | Admitting: Oncology

## 2022-05-11 ENCOUNTER — Encounter: Payer: Self-pay | Admitting: Oncology

## 2022-05-11 VITALS — BP 137/87 | HR 77 | Temp 96.8°F | Resp 18 | Wt 197.0 lb

## 2022-05-11 DIAGNOSIS — Z452 Encounter for adjustment and management of vascular access device: Secondary | ICD-10-CM | POA: Diagnosis not present

## 2022-05-11 DIAGNOSIS — Z79899 Other long term (current) drug therapy: Secondary | ICD-10-CM | POA: Insufficient documentation

## 2022-05-11 DIAGNOSIS — F17210 Nicotine dependence, cigarettes, uncomplicated: Secondary | ICD-10-CM | POA: Insufficient documentation

## 2022-05-11 DIAGNOSIS — Z95828 Presence of other vascular implants and grafts: Secondary | ICD-10-CM

## 2022-05-11 DIAGNOSIS — Z08 Encounter for follow-up examination after completed treatment for malignant neoplasm: Secondary | ICD-10-CM | POA: Diagnosis not present

## 2022-05-11 DIAGNOSIS — Z7902 Long term (current) use of antithrombotics/antiplatelets: Secondary | ICD-10-CM | POA: Diagnosis not present

## 2022-05-11 DIAGNOSIS — Z8551 Personal history of malignant neoplasm of bladder: Secondary | ICD-10-CM | POA: Insufficient documentation

## 2022-05-11 DIAGNOSIS — Z85118 Personal history of other malignant neoplasm of bronchus and lung: Secondary | ICD-10-CM

## 2022-05-11 DIAGNOSIS — I10 Essential (primary) hypertension: Secondary | ICD-10-CM | POA: Insufficient documentation

## 2022-05-11 LAB — COMPREHENSIVE METABOLIC PANEL
ALT: 12 U/L (ref 0–44)
AST: 14 U/L — ABNORMAL LOW (ref 15–41)
Albumin: 3.6 g/dL (ref 3.5–5.0)
Alkaline Phosphatase: 69 U/L (ref 38–126)
Anion gap: 6 (ref 5–15)
BUN: 30 mg/dL — ABNORMAL HIGH (ref 8–23)
CO2: 25 mmol/L (ref 22–32)
Calcium: 9 mg/dL (ref 8.9–10.3)
Chloride: 111 mmol/L (ref 98–111)
Creatinine, Ser: 2.1 mg/dL — ABNORMAL HIGH (ref 0.61–1.24)
GFR, Estimated: 33 mL/min — ABNORMAL LOW (ref >60)
Glucose, Bld: 82 mg/dL (ref 70–99)
Potassium: 3.7 mmol/L (ref 3.5–5.1)
Sodium: 142 mmol/L (ref 135–145)
Total Bilirubin: 0.4 mg/dL (ref 0.3–1.2)
Total Protein: 7 g/dL (ref 6.5–8.1)

## 2022-05-11 LAB — CBC WITH DIFFERENTIAL/PLATELET
Abs Immature Granulocytes: 0.03 10*3/uL (ref 0.00–0.07)
Basophils Absolute: 0 10*3/uL (ref 0.0–0.1)
Basophils Relative: 0 %
Eosinophils Absolute: 0.3 10*3/uL (ref 0.0–0.5)
Eosinophils Relative: 4 %
HCT: 52 % (ref 39.0–52.0)
Hemoglobin: 17.4 g/dL — ABNORMAL HIGH (ref 13.0–17.0)
Immature Granulocytes: 0 %
Lymphocytes Relative: 12 %
Lymphs Abs: 0.8 10*3/uL (ref 0.7–4.0)
MCH: 32 pg (ref 26.0–34.0)
MCHC: 33.5 g/dL (ref 30.0–36.0)
MCV: 95.6 fL (ref 80.0–100.0)
Monocytes Absolute: 0.5 10*3/uL (ref 0.1–1.0)
Monocytes Relative: 7 %
Neutro Abs: 5.2 10*3/uL (ref 1.7–7.7)
Neutrophils Relative %: 77 %
Platelets: 163 10*3/uL (ref 150–400)
RBC: 5.44 MIL/uL (ref 4.22–5.81)
RDW: 12.9 % (ref 11.5–15.5)
WBC: 6.8 10*3/uL (ref 4.0–10.5)
nRBC: 0 % (ref 0.0–0.2)

## 2022-05-11 MED ORDER — HEPARIN SOD (PORK) LOCK FLUSH 100 UNIT/ML IV SOLN
500.0000 [IU] | Freq: Once | INTRAVENOUS | Status: AC
  Administered 2022-05-11: 500 [IU] via INTRAVENOUS
  Filled 2022-05-11: qty 5

## 2022-05-11 MED ORDER — SODIUM CHLORIDE 0.9% FLUSH
10.0000 mL | Freq: Once | INTRAVENOUS | Status: AC
  Administered 2022-05-11: 10 mL via INTRAVENOUS
  Filled 2022-05-11: qty 10

## 2022-05-11 NOTE — Progress Notes (Signed)
Hematology/Oncology Consult note [facility]  Telephone:([phone] [phone]  Patient Care Team: Perrin Maltese, MD as PCP - General (Internal Medicine) Telford Nab, RN as Oncology Nurse Navigator Sindy Guadeloupe, MD as Consulting Physician (Hematology and Oncology) Noreene Filbert, MD as Consulting Physician (Radiation Oncology)   Name of the patient: Nephi Savage  [id number]  September 29, 1950   Date of visit: 05/11/22  Diagnosis- stage IIIc adenocarcinoma of the lung cT3 cN3 M0  Chief complaint/ Reason for visit-routine follow-up of lung cancer  Heme/Onc history: Patient is a 71 year old male with a history of tobacco dependence who was admitted to the hospital for symptoms of acute onset shortness of breath and was found to have Covid pneumonia.  He underwent CT angio chest which showed extensive mediastinal as well as bilateral hilar adenopathy.  Masslike opacity with architectural distortion involving the right upper lobe measuring 5.7 x 2.9 x 2.3 cm and groundglass opacities bilaterally.  Mass involving the left adrenal gland as well as a smaller right adrenal gland possibly indicating adrenal metastases.     This was followed by a PET CT scan 2 weeks later which showed the consolidative process in the right upper lobe suv 7.88 concerning for infiltrating tumor.  It appears to involve the pleura associated interstitial thickening was also seen.  Bulky right hilar and mediastinal adenopathy.  Enlarged somewhat necrotic appearing left supraclavicular lymph node was also hypermetabolic.  Adrenal gland was not hypermetabolic and was consistent with a benign adenoma.  No metastatic disease involving liver.  No features of distant metastatic disease.   Pathology showed metastatic non-small cell lung cancer which was TTF-1 positive consistent with adenocarcinoma   Patient had concurrent chemoradiation followed by maintenance durvalumab ending in January  2023.  He has remained in remission since then.  Interval history-patient is doing well overall.  Denies any worsening cough or shortness of breath.  No recent infections or hospitalizations.  Has some baseline fatigue  ECOG PS- 1 Pain scale- 0   Review of systems- Review of Systems  Constitutional:  Negative for chills, fever, malaise/fatigue and weight loss.  HENT:  Negative for congestion, ear discharge and nosebleeds.   Eyes:  Negative for blurred vision.  Respiratory:  Negative for cough, hemoptysis, sputum production, shortness of breath and wheezing.   Cardiovascular:  Negative for chest pain, palpitations, orthopnea and claudication.  Gastrointestinal:  Negative for abdominal pain, blood in stool, constipation, diarrhea, heartburn, melena, nausea and vomiting.  Genitourinary:  Negative for dysuria, flank pain, frequency, hematuria and urgency.  Musculoskeletal:  Negative for back pain, joint pain and myalgias.  Skin:  Negative for rash.  Neurological:  Negative for dizziness, tingling, focal weakness, seizures, weakness and headaches.  Endo/Heme/Allergies:  Does not bruise/bleed easily.  Psychiatric/Behavioral:  Negative for depression and suicidal ideas. The patient does not have insomnia.       No Known Allergies   Past Medical History:  Diagnosis Date   Bladder cancer ([place]) 2012   COVID-19 virus infection 04/03/2020   Hypertension    Lung cancer ([place])    Peripheral vascular disease ([place])      Past Surgical History:  Procedure Laterality Date   BLADDER REMOVAL     LOWER EXTREMITY ANGIOGRAPHY Left 04/12/2017   Procedure: Lower Extremity Angiography;  Surgeon: Katha Cabal, MD;  Location: [place] [facility];  Service: Cardiovascular;  Laterality: Left;   PORTA CATH INSERTION N/A 05/22/2020   Procedure: PORTA CATH INSERTION;  Surgeon:  Algernon Huxley, MD;  Location: [place] [facility];  Service: Cardiovascular;  Laterality: N/A;   uretostomy     VASCULAR  SURGERY      Social History   Socioeconomic History   Marital status: Married    Spouse name: Not on file   Number of children: Not on file   Years of education: Not on file   Highest education level: Not on file  Occupational History   Not on file  Tobacco Use   Smoking status: Some Days    Packs/day: 0.50    Years: 50.00    Total pack years: 25.00    Types: Cigarettes   Smokeless tobacco: Never   Tobacco comments:    Per pt. He smokes 1 pack every 3 days  Vaping Use   Vaping Use: Never used  Substance and Sexual Activity   Alcohol use: No   Drug use: Not Currently    Types: Marijuana   Sexual activity: Not Currently  Other Topics Concern   Not on file  Social History Narrative   Not on file   Social Determinants of Health   Financial Resource Strain: Not on file  Food Insecurity: Not on file  Transportation Needs: Not on file  Physical Activity: Not on file  Stress: Not on file  Social Connections: Not on file  Intimate Partner Violence: Not on file    History reviewed. No pertinent family history.   Current Outpatient Medications:    amLODipine (NORVASC) 10 MG tablet, Take 10 mg by mouth daily. , Disp: , Rfl:    atorvastatin (LIPITOR) 20 MG tablet, Take 20 mg by mouth every evening. , Disp: , Rfl:    cholecalciferol (VITAMIN D3) 25 MCG (1000 UNIT) tablet, Take 1,000 Units by mouth daily., Disp: , Rfl:    clopidogrel (PLAVIX) 75 MG tablet, Take 75 mg by mouth daily. , Disp: , Rfl:    colchicine 0.6 MG tablet, Take 0.6 mg by mouth daily., Disp: , Rfl:    fexofenadine (ALLEGRA) 180 MG tablet, Take 180 mg by mouth daily as needed., Disp: , Rfl:    TRELEGY ELLIPTA 100-62.5-25 MCG/ACT AEPB, Take 1 puff by mouth daily., Disp: , Rfl:    lidocaine-prilocaine (EMLA) cream, Apply 1 application topically as needed. (Patient not taking: Reported on 01/13/2022), Disp: 30 g, Rfl: 2   oxyCODONE (OXY IR/ROXICODONE) 5 MG immediate release tablet, TAKE 1 TABLET BY MOUTH EVERY  6 HOURS AS NEEDED FOR SEVERE PAIN (Patient not taking: Reported on 05/11/2022), Disp: 45 tablet, Rfl: 0   traZODone (DESYREL) 50 MG tablet, traZODone HCl 50 MG Oral Tablet QTY: 30 tablet Days: 30 Refills: 0  Written: 10/09/21 Patient Instructions: take 1 tablet by mouth nightly as needed for insomnia (Patient not taking: Reported on 01/13/2022), Disp: , Rfl:    zaleplon (SONATA) 5 MG capsule, Take 5 mg by mouth at bedtime as needed. (Patient not taking: Reported on 01/13/2022), Disp: , Rfl:  No current facility-administered medications for this visit.  Facility-Administered Medications Ordered in Other Visits:    sodium chloride flush (NS) 0.9 % injection 10 mL, 10 mL, Intravenous, PRN, Sindy Guadeloupe, MD, 10 mL at 09/18/20 0850  Physical exam:  Vitals:   05/11/22 1307  Resp: 18  Weight: 197 lb (89.4 kg)   Physical Exam Cardiovascular:     Rate and Rhythm: Normal rate and regular rhythm.     Heart sounds: Normal heart sounds.  Pulmonary:     Effort: Pulmonary effort  is normal.     Breath sounds: Normal breath sounds.  Abdominal:     General: Bowel sounds are normal.     Palpations: Abdomen is soft.  Skin:    General: Skin is warm and dry.  Neurological:     Mental Status: He is alert and oriented to person, place, and time.         Latest Ref Rng & Units 05/11/2022   12:29 PM  CMP  Glucose 70 - 99 mg/dL 82   BUN 8 - 23 mg/dL 30   Creatinine 0.61 - 1.24 mg/dL 2.10   Sodium 135 - 145 mmol/L 142   Potassium 3.5 - 5.1 mmol/L 3.7   Chloride 98 - 111 mmol/L 111   CO2 22 - 32 mmol/L 25   Calcium 8.9 - 10.3 mg/dL 9.0   Total Protein 6.5 - 8.1 g/dL 7.0   Total Bilirubin 0.3 - 1.2 mg/dL 0.4   Alkaline Phos 38 - 126 U/L 69   AST 15 - 41 U/L 14   ALT 0 - 44 U/L 12       Latest Ref Rng & Units 05/11/2022   12:29 PM  CBC  WBC 4.0 - 10.5 K/uL 6.8   Hemoglobin 13.0 - 17.0 g/dL 17.4   Hematocrit 39.0 - 52.0 % 52.0   Platelets 150 - 400 K/uL 163     No images are attached to  the encounter.  CT CHEST ABDOMEN PELVIS W CONTRAST  Result Date: 05/04/2022 CLINICAL DATA:  Right lung cancer diagnosed 9/21. Status post radiation therapy and chemotherapy. Non-small-cell primary. * Tracking Code: BO * EXAM: CT CHEST, ABDOMEN, AND PELVIS WITH CONTRAST TECHNIQUE: Multidetector CT imaging of the chest, abdomen and pelvis was performed following the standard protocol during bolus administration of intravenous contrast. RADIATION DOSE REDUCTION: This exam was performed according to the departmental dose-optimization program which includes automated exposure control, adjustment of the mA and/or kV according to patient size and/or use of iterative reconstruction technique. CONTRAST:  121mL OMNIPAQUE IOHEXOL 300 MG/ML  SOLN COMPARISON:  01/06/2022 FINDINGS: CT CHEST FINDINGS Cardiovascular: Advanced aortic and branch vessel atherosclerosis. Normal heart size, without pericardial effusion. Lad and left circumflex coronary artery calcification. No central pulmonary embolism, on this non-dedicated study. Pulmonary artery enlargement, outflow tract 3.1 cm. Mediastinum/Nodes: No supraclavicular adenopathy. No mediastinal or hilar adenopathy. Tiny hiatal hernia. Lungs/Pleura: No pleural fluid.  Moderate centrilobular emphysema. Similar configuration of medial right upper lobe interstitial thickening and architectural distortion, likely radiation induced. No evidence of residual or local recurrent disease. Again identified is interstitial lung disease, as evidenced by subpleural reticulation and ground-glass without craniocaudal gradient. Musculoskeletal: No acute osseous abnormality. CT ABDOMEN PELVIS FINDINGS Hepatobiliary: 4 mm gallstone. Segment 2 3 mm hypoattenuating lesion is too small to characterize but likely a cyst, similar. Pancreas: Normal, without mass or ductal dilatation. Spleen: Normal in size, without focal abnormality. Adrenals/Urinary Tract: Left greater than right adrenal nodularity is  similar. The left adrenal nodule of 2.3 cm is low-density on the prior noncontrast, consistent with an adenoma. 1.0 cm right adrenal nodule measures 11 HU on the prior noncontrast CT and is also consistent with an adenoma. Mild renal cortical thinning bilaterally. Bilateral too small to characterize renal lesions, most likely cysts. No hydronephrosis. Status post cystoprostatectomy and ileal conduit creation, without acute complication. Stomach/Bowel: Normal remainder of the stomach. Scattered colonic diverticula. Normal terminal ileum and appendix. Otherwise normal small bowel. Vascular/Lymphatic: Advanced aortic and branch vessel atherosclerosis. Left renal artery stent. Bilateral  common iliac artery stent grafts. Status post fem-fem bypass, presumably bypassing chronically occluded left iliac vasculature. Pelvic node dissection. No abdominopelvic adenopathy. Reproductive: Prostatectomy. Other: No significant free fluid. No free intraperitoneal air. No evidence of omental or peritoneal disease. Musculoskeletal: Presumed bone island within the left acetabulum, similar back to 11/03/2020. IMPRESSION: 1. Radiation change within the right upper lobe, without recurrent or metastatic disease. 2. Status post cystoprostatectomy and ileal conduit creation without acute complication. 3. Interstitial lung disease, favoring nonspecific interstitial pneumonitis. 4. Aortic atherosclerosis (ICD10-I70.0), coronary artery atherosclerosis and emphysema (ICD10-J43.9). 5. Cholelithiasis. Electronically Signed   By: Abigail Miyamoto M.D.   On: 05/04/2022 11:11     Assessment and plan- Patient is a 71 y.o. male with history of stage III adenocarcinoma of the lung s/p concurrent chemoradiation followed by maintenance durvalumab.  He is here to discuss CT scan results and further management   I have reviewed CT chest abdomen pelvis images independently and discussed findings with the patient which shows changes of radiation in the right  upper lobe.  No evidence of mediastinal or hilar adenopathy.  No other evidence of recurrent or progressive disease.  He continues to remain in remission.  There were some nonspecific findings of possible interstitial pneumonitis.  He has had these changes in the past as well.  No new respiratory complaints.  Continue to monitor  I will see him back.  4 months with CT chest abdomen and pelvis with contrast prior.   Visit Diagnosis 1. Encounter for follow-up surveillance of lung cancer      Dr. Randa Evens, MD, MPH [facility] [phone] 05/11/2022 1:08 PM

## 2022-05-12 ENCOUNTER — Ambulatory Visit: Payer: Medicare Other | Admitting: Oncology

## 2022-05-12 ENCOUNTER — Other Ambulatory Visit: Payer: Medicare Other

## 2022-07-06 DIAGNOSIS — R7303 Prediabetes: Secondary | ICD-10-CM | POA: Diagnosis not present

## 2022-07-06 DIAGNOSIS — E1165 Type 2 diabetes mellitus with hyperglycemia: Secondary | ICD-10-CM | POA: Diagnosis not present

## 2022-07-06 DIAGNOSIS — I10 Essential (primary) hypertension: Secondary | ICD-10-CM | POA: Diagnosis not present

## 2022-07-06 DIAGNOSIS — E782 Mixed hyperlipidemia: Secondary | ICD-10-CM | POA: Diagnosis not present

## 2022-07-06 DIAGNOSIS — I739 Peripheral vascular disease, unspecified: Secondary | ICD-10-CM | POA: Diagnosis not present

## 2022-07-06 DIAGNOSIS — N189 Chronic kidney disease, unspecified: Secondary | ICD-10-CM | POA: Diagnosis not present

## 2022-07-06 DIAGNOSIS — M109 Gout, unspecified: Secondary | ICD-10-CM | POA: Diagnosis not present

## 2022-07-06 DIAGNOSIS — E559 Vitamin D deficiency, unspecified: Secondary | ICD-10-CM | POA: Diagnosis not present

## 2022-07-24 DIAGNOSIS — E782 Mixed hyperlipidemia: Secondary | ICD-10-CM | POA: Diagnosis not present

## 2022-07-24 DIAGNOSIS — N189 Chronic kidney disease, unspecified: Secondary | ICD-10-CM | POA: Diagnosis not present

## 2022-07-24 DIAGNOSIS — I129 Hypertensive chronic kidney disease with stage 1 through stage 4 chronic kidney disease, or unspecified chronic kidney disease: Secondary | ICD-10-CM | POA: Diagnosis not present

## 2022-07-29 ENCOUNTER — Other Ambulatory Visit: Payer: Self-pay

## 2022-07-29 ENCOUNTER — Ambulatory Visit: Payer: Medicare Other | Attending: Radiation Oncology | Admitting: Radiation Oncology

## 2022-07-29 ENCOUNTER — Inpatient Hospital Stay
Admission: EM | Admit: 2022-07-29 | Discharge: 2022-08-02 | DRG: 871 | Disposition: A | Discharge status: Home Health | Payer: Medicare Other | Attending: Obstetrics and Gynecology | Admitting: Obstetrics and Gynecology

## 2022-07-29 ENCOUNTER — Emergency Department: Payer: Medicare Other

## 2022-07-29 DIAGNOSIS — J101 Influenza due to other identified influenza virus with other respiratory manifestations: Secondary | ICD-10-CM | POA: Diagnosis present

## 2022-07-29 DIAGNOSIS — R5381 Other malaise: Secondary | ICD-10-CM | POA: Diagnosis present

## 2022-07-29 DIAGNOSIS — A419 Sepsis, unspecified organism: Secondary | ICD-10-CM | POA: Diagnosis not present

## 2022-07-29 DIAGNOSIS — Z87891 Personal history of nicotine dependence: Secondary | ICD-10-CM | POA: Diagnosis not present

## 2022-07-29 DIAGNOSIS — N179 Acute kidney failure, unspecified: Secondary | ICD-10-CM | POA: Diagnosis present

## 2022-07-29 DIAGNOSIS — J811 Chronic pulmonary edema: Secondary | ICD-10-CM | POA: Diagnosis not present

## 2022-07-29 DIAGNOSIS — J9601 Acute respiratory failure with hypoxia: Secondary | ICD-10-CM | POA: Diagnosis present

## 2022-07-29 DIAGNOSIS — Z7951 Long term (current) use of inhaled steroids: Secondary | ICD-10-CM | POA: Diagnosis not present

## 2022-07-29 DIAGNOSIS — R059 Cough, unspecified: Secondary | ICD-10-CM | POA: Diagnosis not present

## 2022-07-29 DIAGNOSIS — Z79899 Other long term (current) drug therapy: Secondary | ICD-10-CM

## 2022-07-29 DIAGNOSIS — Z9889 Other specified postprocedural states: Secondary | ICD-10-CM

## 2022-07-29 DIAGNOSIS — E872 Acidosis, unspecified: Secondary | ICD-10-CM | POA: Diagnosis not present

## 2022-07-29 DIAGNOSIS — C3411 Malignant neoplasm of upper lobe, right bronchus or lung: Secondary | ICD-10-CM | POA: Diagnosis not present

## 2022-07-29 DIAGNOSIS — C3490 Malignant neoplasm of unspecified part of unspecified bronchus or lung: Secondary | ICD-10-CM | POA: Diagnosis present

## 2022-07-29 DIAGNOSIS — Z1152 Encounter for screening for COVID-19: Secondary | ICD-10-CM

## 2022-07-29 DIAGNOSIS — D6959 Other secondary thrombocytopenia: Secondary | ICD-10-CM | POA: Diagnosis not present

## 2022-07-29 DIAGNOSIS — R0902 Hypoxemia: Principal | ICD-10-CM

## 2022-07-29 DIAGNOSIS — R0602 Shortness of breath: Secondary | ICD-10-CM | POA: Diagnosis not present

## 2022-07-29 DIAGNOSIS — N1832 Chronic kidney disease, stage 3b: Secondary | ICD-10-CM | POA: Diagnosis not present

## 2022-07-29 DIAGNOSIS — Z9221 Personal history of antineoplastic chemotherapy: Secondary | ICD-10-CM

## 2022-07-29 DIAGNOSIS — I509 Heart failure, unspecified: Secondary | ICD-10-CM | POA: Diagnosis not present

## 2022-07-29 DIAGNOSIS — J1000 Influenza due to other identified influenza virus with unspecified type of pneumonia: Secondary | ICD-10-CM | POA: Diagnosis present

## 2022-07-29 DIAGNOSIS — Z7902 Long term (current) use of antithrombotics/antiplatelets: Secondary | ICD-10-CM | POA: Diagnosis not present

## 2022-07-29 DIAGNOSIS — I1310 Hypertensive heart and chronic kidney disease without heart failure, with stage 1 through stage 4 chronic kidney disease, or unspecified chronic kidney disease: Secondary | ICD-10-CM | POA: Diagnosis present

## 2022-07-29 DIAGNOSIS — R6520 Severe sepsis without septic shock: Secondary | ICD-10-CM | POA: Diagnosis not present

## 2022-07-29 DIAGNOSIS — I739 Peripheral vascular disease, unspecified: Secondary | ICD-10-CM | POA: Diagnosis not present

## 2022-07-29 DIAGNOSIS — Z936 Other artificial openings of urinary tract status: Secondary | ICD-10-CM

## 2022-07-29 DIAGNOSIS — J9600 Acute respiratory failure, unspecified whether with hypoxia or hypercapnia: Secondary | ICD-10-CM | POA: Diagnosis not present

## 2022-07-29 DIAGNOSIS — I10 Essential (primary) hypertension: Secondary | ICD-10-CM | POA: Diagnosis present

## 2022-07-29 DIAGNOSIS — J984 Other disorders of lung: Secondary | ICD-10-CM | POA: Diagnosis not present

## 2022-07-29 DIAGNOSIS — Z8616 Personal history of COVID-19: Secondary | ICD-10-CM

## 2022-07-29 DIAGNOSIS — R6889 Other general symptoms and signs: Secondary | ICD-10-CM | POA: Diagnosis not present

## 2022-07-29 DIAGNOSIS — J09X1 Influenza due to identified novel influenza A virus with pneumonia: Secondary | ICD-10-CM | POA: Diagnosis not present

## 2022-07-29 DIAGNOSIS — Z85118 Personal history of other malignant neoplasm of bronchus and lung: Secondary | ICD-10-CM

## 2022-07-29 DIAGNOSIS — F17210 Nicotine dependence, cigarettes, uncomplicated: Secondary | ICD-10-CM | POA: Diagnosis not present

## 2022-07-29 DIAGNOSIS — A4189 Other specified sepsis: Principal | ICD-10-CM | POA: Diagnosis present

## 2022-07-29 DIAGNOSIS — Z923 Personal history of irradiation: Secondary | ICD-10-CM | POA: Diagnosis not present

## 2022-07-29 DIAGNOSIS — C679 Malignant neoplasm of bladder, unspecified: Secondary | ICD-10-CM

## 2022-07-29 DIAGNOSIS — Z8551 Personal history of malignant neoplasm of bladder: Secondary | ICD-10-CM

## 2022-07-29 DIAGNOSIS — J111 Influenza due to unidentified influenza virus with other respiratory manifestations: Secondary | ICD-10-CM

## 2022-07-29 DIAGNOSIS — F17200 Nicotine dependence, unspecified, uncomplicated: Secondary | ICD-10-CM | POA: Diagnosis present

## 2022-07-29 DIAGNOSIS — I517 Cardiomegaly: Secondary | ICD-10-CM | POA: Diagnosis not present

## 2022-07-29 DIAGNOSIS — R509 Fever, unspecified: Secondary | ICD-10-CM | POA: Diagnosis not present

## 2022-07-29 DIAGNOSIS — R918 Other nonspecific abnormal finding of lung field: Secondary | ICD-10-CM | POA: Diagnosis not present

## 2022-07-29 DIAGNOSIS — Z743 Need for continuous supervision: Secondary | ICD-10-CM | POA: Diagnosis not present

## 2022-07-29 HISTORY — DX: Sepsis, unspecified organism: A41.9

## 2022-07-29 HISTORY — DX: Acute respiratory failure with hypoxia: J96.01

## 2022-07-29 HISTORY — DX: Influenza due to identified novel influenza A virus with pneumonia: J09.X1

## 2022-07-29 LAB — URINALYSIS, COMPLETE (UACMP) WITH MICROSCOPIC
Bilirubin Urine: NEGATIVE
Glucose, UA: NEGATIVE mg/dL
Hgb urine dipstick: NEGATIVE
Ketones, ur: NEGATIVE mg/dL
Leukocytes,Ua: NEGATIVE
Nitrite: NEGATIVE
Protein, ur: 30 mg/dL — AB
Specific Gravity, Urine: 1.013 (ref 1.005–1.030)
pH: 7 (ref 5.0–8.0)

## 2022-07-29 LAB — CBC WITH DIFFERENTIAL/PLATELET
Abs Immature Granulocytes: 0.01 10*3/uL (ref 0.00–0.07)
Basophils Absolute: 0 10*3/uL (ref 0.0–0.1)
Basophils Relative: 1 %
Eosinophils Absolute: 0 10*3/uL (ref 0.0–0.5)
Eosinophils Relative: 1 %
HCT: 50.3 % (ref 39.0–52.0)
Hemoglobin: 16.6 g/dL (ref 13.0–17.0)
Immature Granulocytes: 0 %
Lymphocytes Relative: 7 %
Lymphs Abs: 0.3 10*3/uL — ABNORMAL LOW (ref 0.7–4.0)
MCH: 31.9 pg (ref 26.0–34.0)
MCHC: 33 g/dL (ref 30.0–36.0)
MCV: 96.7 fL (ref 80.0–100.0)
Monocytes Absolute: 0.6 10*3/uL (ref 0.1–1.0)
Monocytes Relative: 15 %
Neutro Abs: 3.2 10*3/uL (ref 1.7–7.7)
Neutrophils Relative %: 76 %
Platelets: 120 10*3/uL — ABNORMAL LOW (ref 150–400)
RBC: 5.2 MIL/uL (ref 4.22–5.81)
RDW: 12.7 % (ref 11.5–15.5)
Smear Review: NORMAL
WBC: 4.2 10*3/uL (ref 4.0–10.5)
nRBC: 0 % (ref 0.0–0.2)

## 2022-07-29 LAB — RESP PANEL BY RT-PCR (RSV, FLU A&B, COVID)  RVPGX2
Influenza A by PCR: POSITIVE — AB
Influenza B by PCR: NEGATIVE
Resp Syncytial Virus by PCR: NEGATIVE
SARS Coronavirus 2 by RT PCR: NEGATIVE

## 2022-07-29 LAB — COMPREHENSIVE METABOLIC PANEL
ALT: 19 U/L (ref 0–44)
AST: 23 U/L (ref 15–41)
Albumin: 3.7 g/dL (ref 3.5–5.0)
Alkaline Phosphatase: 57 U/L (ref 38–126)
Anion gap: 11 (ref 5–15)
BUN: 18 mg/dL (ref 8–23)
CO2: 24 mmol/L (ref 22–32)
Calcium: 8.7 mg/dL — ABNORMAL LOW (ref 8.9–10.3)
Chloride: 105 mmol/L (ref 98–111)
Creatinine, Ser: 1.78 mg/dL — ABNORMAL HIGH (ref 0.61–1.24)
GFR, Estimated: 40 mL/min — ABNORMAL LOW (ref >60)
Glucose, Bld: 117 mg/dL — ABNORMAL HIGH (ref 70–99)
Potassium: 3.8 mmol/L (ref 3.5–5.1)
Sodium: 140 mmol/L (ref 135–145)
Total Bilirubin: 0.9 mg/dL (ref 0.3–1.2)
Total Protein: 7.5 g/dL (ref 6.5–8.1)

## 2022-07-29 LAB — PROTIME-INR
INR: 1.2 (ref 0.8–1.2)
Prothrombin Time: 15 seconds (ref 11.4–15.2)

## 2022-07-29 LAB — LACTIC ACID, PLASMA
Lactic Acid, Venous: 1.2 mmol/L (ref 0.5–1.9)
Lactic Acid, Venous: 2.1 mmol/L (ref 0.5–1.9)

## 2022-07-29 LAB — APTT: aPTT: 41 seconds — ABNORMAL HIGH (ref 24–36)

## 2022-07-29 LAB — TROPONIN I (HIGH SENSITIVITY): Troponin I (High Sensitivity): 30 ng/L — ABNORMAL HIGH (ref <18)

## 2022-07-29 LAB — BRAIN NATRIURETIC PEPTIDE: B Natriuretic Peptide: 121.6 pg/mL — ABNORMAL HIGH (ref 0.0–100.0)

## 2022-07-29 MED ORDER — AMLODIPINE BESYLATE 10 MG PO TABS
10.0000 mg | ORAL_TABLET | Freq: Every day | ORAL | Status: DC
  Administered 2022-07-30 – 2022-08-02 (×4): 10 mg via ORAL
  Filled 2022-07-29: qty 2
  Filled 2022-07-29 (×3): qty 1

## 2022-07-29 MED ORDER — HYDROCODONE-ACETAMINOPHEN 5-325 MG PO TABS: 1.0000 | ORAL_TABLET | ORAL | Status: DC | PRN

## 2022-07-29 MED ORDER — METRONIDAZOLE 500 MG/100ML IV SOLN
500.0000 mg | Freq: Once | INTRAVENOUS | Status: AC
  Administered 2022-07-29: 500 mg via INTRAVENOUS
  Filled 2022-07-29: qty 100

## 2022-07-29 MED ORDER — OSELTAMIVIR PHOSPHATE 30 MG PO CAPS
30.0000 mg | ORAL_CAPSULE | Freq: Two times a day (BID) | ORAL | Status: DC
  Filled 2022-07-29: qty 1

## 2022-07-29 MED ORDER — VANCOMYCIN HCL 1750 MG/350ML IV SOLN
1750.0000 mg | Freq: Once | INTRAVENOUS | Status: AC
  Administered 2022-07-29: 1750 mg via INTRAVENOUS
  Filled 2022-07-29: qty 350

## 2022-07-29 MED ORDER — ACETAMINOPHEN 500 MG PO TABS
1000.0000 mg | ORAL_TABLET | Freq: Once | ORAL | Status: AC
  Administered 2022-07-29: 1000 mg via ORAL
  Filled 2022-07-29: qty 2

## 2022-07-29 MED ORDER — VANCOMYCIN HCL IN DEXTROSE 1-5 GM/200ML-% IV SOLN: 1000.0000 mg | Freq: Once | INTRAVENOUS | Status: DC

## 2022-07-29 MED ORDER — ONDANSETRON HCL 4 MG/2ML IJ SOLN
4.0000 mg | Freq: Four times a day (QID) | INTRAMUSCULAR | Status: DC | PRN
  Filled 2022-07-29: qty 2

## 2022-07-29 MED ORDER — ENOXAPARIN SODIUM 40 MG/0.4ML IJ SOSY
40.0000 mg | PREFILLED_SYRINGE | Freq: Every day | INTRAMUSCULAR | Status: DC
  Administered 2022-07-29 – 2022-08-01 (×4): 40 mg via SUBCUTANEOUS
  Filled 2022-07-29 (×4): qty 0.4

## 2022-07-29 MED ORDER — SODIUM CHLORIDE 0.9 % IV BOLUS (SEPSIS)
1000.0000 mL | Freq: Once | INTRAVENOUS | Status: AC
  Administered 2022-07-29: 1000 mL via INTRAVENOUS

## 2022-07-29 MED ORDER — IPRATROPIUM-ALBUTEROL 0.5-2.5 (3) MG/3ML IN SOLN
3.0000 mL | Freq: Once | RESPIRATORY_TRACT | Status: AC
  Administered 2022-07-29: 3 mL via RESPIRATORY_TRACT
  Filled 2022-07-29: qty 3

## 2022-07-29 MED ORDER — OSELTAMIVIR PHOSPHATE 75 MG PO CAPS
75.0000 mg | ORAL_CAPSULE | Freq: Once | ORAL | Status: AC
  Administered 2022-07-29: 75 mg via ORAL
  Filled 2022-07-29: qty 1

## 2022-07-29 MED ORDER — LACTATED RINGERS IV SOLN: INTRAVENOUS | Status: AC

## 2022-07-29 MED ORDER — FUROSEMIDE 10 MG/ML IJ SOLN
60.0000 mg | Freq: Once | INTRAMUSCULAR | Status: AC
  Administered 2022-07-29: 60 mg via INTRAVENOUS
  Filled 2022-07-29: qty 8

## 2022-07-29 MED ORDER — ONDANSETRON HCL 4 MG PO TABS: 4.0000 mg | ORAL_TABLET | Freq: Four times a day (QID) | ORAL | Status: DC | PRN

## 2022-07-29 MED ORDER — ACETAMINOPHEN 650 MG RE SUPP: 650.0000 mg | Freq: Four times a day (QID) | RECTAL | Status: DC | PRN

## 2022-07-29 MED ORDER — SODIUM CHLORIDE 0.9 % IV SOLN
2.0000 g | Freq: Once | INTRAVENOUS | Status: AC
  Administered 2022-07-29: 2 g via INTRAVENOUS
  Filled 2022-07-29: qty 12.5

## 2022-07-29 MED ORDER — ACETAMINOPHEN 325 MG PO TABS
650.0000 mg | ORAL_TABLET | Freq: Four times a day (QID) | ORAL | Status: DC | PRN
  Administered 2022-07-30 – 2022-07-31 (×3): 650 mg via ORAL
  Filled 2022-07-29 (×3): qty 2

## 2022-07-29 MED ORDER — ATORVASTATIN CALCIUM 20 MG PO TABS
20.0000 mg | ORAL_TABLET | Freq: Every evening | ORAL | Status: DC
  Administered 2022-07-30 – 2022-08-01 (×3): 20 mg via ORAL
  Filled 2022-07-29 (×3): qty 1

## 2022-07-29 MED ORDER — OSELTAMIVIR PHOSPHATE 30 MG PO CAPS
30.0000 mg | ORAL_CAPSULE | Freq: Two times a day (BID) | ORAL | Status: DC
  Administered 2022-07-30 – 2022-08-02 (×7): 30 mg via ORAL
  Filled 2022-07-29 (×8): qty 1

## 2022-07-29 NOTE — Consult Note (Signed)
CODE SEPSIS - PHARMACY COMMUNICATION  **Broad Spectrum Antibiotics should be administered within 1 hour of Sepsis diagnosis**  Time Code Sepsis Called/Page Received: 1756  Antibiotics Ordered: cefepime, vancomycin  Time of 1st antibiotic administration: 1826  Additional action taken by pharmacy: N/A     Alison Murray ,PharmD Clinical Pharmacist  07/29/2022  6:05 PM

## 2022-07-29 NOTE — Consult Note (Signed)
CODE SEPSIS - PHARMACY COMMUNICATION  **Broad Spectrum Antibiotics should be administered within 1 hour of Sepsis diagnosis**  Time Code Sepsis Called/Page Received: 1756  Antibiotics Ordered: Cefepime, Vancomycin  Time of 1st antibiotic administration: 1826  Additional action taken by pharmacy: N/A  Gretel Acre, PharmD PGY1 Pharmacy Resident 07/29/2022 6:47 PM

## 2022-07-29 NOTE — Assessment & Plan Note (Addendum)
Related to influenza A and sepsis  Lower suspicion for mild fluid overload as well given findings on chest x-ray.  With history of cancer, PE also in the differential We will get baseline echocardiogram as well as BNP.  No prior history of heart disease Patient got a dose of Lasix in the ED Daily weight Monitor for fluid overload

## 2022-07-29 NOTE — ED Notes (Signed)
1 set of blood cultures, 1 gray top on ice, 1 light green top, lavender top, blue top, and red top obtained on the LEFT hand by this tech. Covid swab obtained as well.

## 2022-07-29 NOTE — Progress Notes (Signed)
PHARMACY NOTE:  ANTIMICROBIAL RENAL DOSAGE ADJUSTMENT  Current antimicrobial regimen includes a mismatch between antimicrobial dosage and estimated renal function.  As per policy approved by the Pharmacy & Therapeutics and Medical Executive Committees, the antimicrobial dosage will be adjusted accordingly.  Current antimicrobial dosage:  Tamiflu 75 mg PO BID   Indication: Flu   Renal Function:  Estimated Creatinine Clearance: 44.8 mL/min (A) (by C-G formula based on SCr of 1.78 mg/dL (H)). []      On intermittent HD, scheduled: []      On CRRT    Antimicrobial dosage has been changed to: Tamiflu 75 mg x 1 followed by 30 mg PO BID X 5 days total  Additional comments:   Thank you for allowing pharmacy to be a part of this patient's care.  Dorothe Pea, PharmD, BCPS Clinical Pharmacist   07/29/2022 9:55 PM

## 2022-07-29 NOTE — ED Provider Notes (Signed)
[facility] Provider Note    Event Date/Time   First MD Initiated Contact with Patient 07/29/22 1754     (approximate)  History   Chief Complaint: Cough and Fever  HPI  Martin Jennings is a 72 y.o. male with a past medical history of hypertension, lung cancer, presents to the emergency department for worsening cough congestion shortness of breath.  Patient states over the past month or so he has had a cough but over the past several days it has worsened significantly.  Patient has near constant coughing throughout my evaluation.  Occasional sputum production per patient.  Denies any chest pain or abdominal pain.  Patient states he has been feeling weak EMS found the patient be febrile to 100.4 currently satting in the 70s on room air with no baseline O2 requirement.  Nontender abdomen but the patient does have a urostomy bag present x 5 years per patient.  Physical Exam   Triage Vital Signs: ED Triage Vitals [07/29/22 1755]  Enc Vitals Group     BP      Pulse      Resp      Temp      Temp src      SpO2      Weight 210 lb (95.3 kg)     Height 5\' 11"  (1.803 m)     Head Circumference      Peak Flow      Pain Score      Pain Loc      Pain Edu?      Excl. in [place]?     Most recent vital signs: There were no vitals filed for this visit.  General: Awake, frequent cough. CV:  Good peripheral perfusion.  Regular rate and rhythm  Resp:  Normal effort.  Equal breath sounds bilaterally.  No obvious wheeze rales or rhonchi but very frequent cough near constant. Abd:  No distention.  Soft, nontender.  No rebound or guarding.  Back present.   ED Results / Procedures / Treatments   EKG  EKG viewed and interpreted by myself shows sinus tachycardia 104 bpm with a narrow QRS, normal axis, normal intervals besides slight PR prolongation, nonspecific ST changes.  RADIOLOGY  Chest x-ray viewed and interpreted by myself shows some bilateral haziness but no obvious  or significant consolidation seen on my evaluation. Radiology reads cardiomegaly with mild diffuse bilateral interstitial opacities possibly edema.   MEDICATIONS ORDERED IN ED: Medications  sodium chloride 0.9 % bolus 1,000 mL (has no administration in time range)  ceFEPIme (MAXIPIME) 2 g in sodium chloride 0.9 % 100 mL IVPB (has no administration in time range)  metroNIDAZOLE (FLAGYL) IVPB 500 mg (has no administration in time range)  vancomycin (VANCOCIN) IVPB 1000 mg/200 mL premix (has no administration in time range)     IMPRESSION / MDM / ASSESSMENT AND PLAN / ED COURSE  I reviewed the triage vital signs and the nursing notes.  Patient's presentation is most consistent with acute presentation with potential threat to life or bodily function.  Patient presents emergency department for worsening cough and shortness of breath.  States symptoms of cough have been ongoing for the past month but have worsened significantly over the past few days along with generalized weakness found to be febrile by EMS to 100.4.  Patient has a near constant cough throughout my evaluation wet sounding cough occasional sputum.  We will check labs, cultures, chest x-ray, COVID/flu/RSV.  Given the patient's fever  cough hypoxia we will start the patient on broad-spectrum antibiotics until further information is obtained.  We will dose DuoNebs and continue to closely monitor while awaiting results.  I reviewed Dr. Elroy Channel notes from oncology.  Patient has adenocarcinoma of the lung status post chemotherapy with ongoing chemotherapy.  Patient is satting in the mid 90s currently on 3 L but no baseline O2 requirement.  Patient's urinalysis does not appear to show any obvious or significant infection.  Lactic acid of 1.2, chemistry shows chronic renal insufficiency largely unchanged from historical values.  Patient's influenza A swab is positive.  X-ray is read as diffuse pulmonary interstitial opacities possible edema.  I  have added on a BNP to help further evaluate this versus an atypical infection with influenza.  While awaiting BNP results we will dose a one-time dose of 60 mg IV Lasix.  I have also added on a troponin as a precaution.  Given the patient's new onset hypoxia likely due to influenza A patient will require admission to the hospital service for further workup and treatment.    CRITICAL CARE Performed by: Harvest Dark   Total critical care time: 30 minutes  Critical care time was exclusive of separately billable procedures and treating other patients.  Critical care was necessary to treat or prevent imminent or life-threatening deterioration.  Critical care was time spent personally by me on the following activities: development of treatment plan with patient and/or surrogate as well as nursing, discussions with consultants, evaluation of patient's response to treatment, examination of patient, obtaining history from patient or surrogate, ordering and performing treatments and interventions, ordering and review of laboratory studies, ordering and review of radiographic studies, pulse oximetry and re-evaluation of patient's condition.   FINAL CLINICAL IMPRESSION(S) / ED DIAGNOSES   Dyspnea Hypoxia Sepsis  Note:  This document was prepared using Dragon voice recognition software and may include unintentional dictation errors.   Harvest Dark, MD 07/29/22 2034

## 2022-07-29 NOTE — Assessment & Plan Note (Signed)
-   Continue amlodipine ?

## 2022-07-29 NOTE — Assessment & Plan Note (Signed)
>>  ASSESSMENT AND PLAN FOR HISTORY OF BLADDER CANCER S/P NEOADJUVANT CHEMOTHERAPY AND CYSTOPROSTATECTOMY WRITTEN ON 07/29/2022  9:04 PM BY Andris Baumann, MD  S/p ileal conduit 2012 Last seen by urology in April 2023

## 2022-07-29 NOTE — Assessment & Plan Note (Signed)
s/p chemoradiation, currently in remission, last seen by oncology 05/11/2022,

## 2022-07-29 NOTE — Progress Notes (Signed)
PHARMACY NOTE:  ANTIMICROBIAL RENAL DOSAGE ADJUSTMENT  Current antimicrobial regimen includes a mismatch between antimicrobial dosage and estimated renal function.  As per policy approved by the Pharmacy & Therapeutics and Medical Executive Committees, the antimicrobial dosage will be adjusted accordingly.  Current antimicrobial dosage:  Tamiflu 75 mg PO BID   Indication: Flu   Renal Function:  Estimated Creatinine Clearance: 44.8 mL/min (A) (by C-G formula based on SCr of 1.78 mg/dL (H)). []      On intermittent HD, scheduled: []      On CRRT    Antimicrobial dosage has been changed to:  Tamiflu 30 mg PO BID X 5 days   Additional comments:   Thank you for allowing pharmacy to be a part of this patient's care.  Devani Odonnel D, [facility] 07/29/2022 9:40 PM

## 2022-07-29 NOTE — Progress Notes (Signed)
PHARMACY -  BRIEF ANTIBIOTIC NOTE   Pharmacy has received consult(s) for vancomycin, cefepime from an ED provider.  The patient's profile has been reviewed for ht/wt/allergies/indication/available labs.    One time order(s) placed for Vancomycin 1750 mg IV x 1 and Cefepime 2 gm IV x 1  Further antibiotics/pharmacy consults should be ordered by admitting physician if indicated.                       Thank you, Alison Murray 07/29/2022  6:04 PM

## 2022-07-29 NOTE — Sepsis Progress Note (Signed)
Elink following code sepsis °

## 2022-07-29 NOTE — Assessment & Plan Note (Signed)
Creatinine 1.78 above baseline of 1.0 couple months prior, likely related to sepsis Expecting improvement with IV hydration Monitor renal function and avoid nephrotoxins

## 2022-07-29 NOTE — Assessment & Plan Note (Signed)
Acute respiratory failure with hypoxia Sepsis Sepsis criteria: Fever, tachycardia.  Normal WBC but lactic acidosis of 2.1 as well as AKI and respiratory failure Patient with increasing shortness of breath requiring 3 L to maintain sats 94-96 Influenza A positive, COVID and RSV negative Continue sepsis fluids Tamiflu 75 twice daily Supplemental oxygen to keep sats over 94% Antitussives, incentive spirometry DuoNebs as needed

## 2022-07-29 NOTE — H&P (Signed)
History and Physical    Patient: Martin Jennings:[id number] DOB: 05-09-1951 DOA: 07/29/2022 DOS: the patient was seen and examined on 07/29/2022 PCP: Perrin Maltese, MD  Patient coming from: Home  Chief Complaint:  Chief Complaint  Patient presents with   Cough   Fever    HPI: Martin Jennings is a 72 y.o. male with medical history significant for bladder cancer s/p neoadjuvant chemotherapy and radical cystoprostatectomy with ileal conduit August 2012 , hypertension, peripheral vascular disease, metastatic non-small cell adenocarcinoma of the lung s/p chemoradiation, currently in remission, last seen by oncology 05/11/2022, who was brought in by EMS with a several day history of a worsening cough.  Patient has had a cough for a month.  With EMS he had a temperature of 100.4 and required 3 L to maintain sats in the mid 90s.  He denies chest pain, vomiting, abdominal pain or diarrhea. ED course and data review: Tmax 100.4 with pulse up to 113, SBP 160s to 180s and O2 sat 94-96 on 3 L.  CBC unremarkable except for new thrombocytopenia 120,000 down from 1 63,000 a couple months prior.  Lactic acid 2.1.  Creatinine 1.78, Up from baseline of 1.0 a couple months prior.  Urinalysis unremarkable.  Respiratory viral panel positive for influenza A.  EKG, personally viewed and interpreted shows sinus tachycardia at 104 with nonspecific ST-T wave changes.  Chest x-ray shows mild diffuse bilateral interstitial pulmonary opacity, likely edema as detailed below: IMPRESSION: 1. Cardiomegaly with mild diffuse bilateral interstitial pulmonary opacity, likely edema. 2. Unchanged post treatment appearance of the paramedian right upper lobe.  Patient treated with DuoNebs, and started on sepsis fluids, cefepime vancomycin and Flagyl, also given a dose of Lasix and hospitalist consulted for admission.   Review of Systems: As mentioned in the history of present illness. All other systems reviewed and are  negative.  Past Medical History:  Diagnosis Date   Bladder cancer ([place]) 2012   COVID-19 virus infection 04/03/2020   Hypertension    Lung cancer ([place])    Peripheral vascular disease [facility])    Past Surgical History:  Procedure Laterality Date   BLADDER REMOVAL     LOWER EXTREMITY ANGIOGRAPHY Left 04/12/2017   Procedure: Lower Extremity Angiography;  Surgeon: Katha Cabal, MD;  Location: [place] [facility];  Service: Cardiovascular;  Laterality: Left;   PORTA CATH INSERTION N/A 05/22/2020   Procedure: PORTA CATH INSERTION;  Surgeon: Algernon Huxley, MD;  Location: [place] [facility];  Service: Cardiovascular;  Laterality: N/A;   uretostomy     VASCULAR SURGERY     Social History:  reports that he has been smoking cigarettes. He has a 25.00 pack-year smoking history. He has never used smokeless tobacco. He reports that he does not currently use drugs after having used the following drugs: Marijuana. He reports that he does not drink alcohol.  No Known Allergies  No family history on file.  Prior to Admission medications   Medication Sig Start Date End Date Taking? Authorizing Provider  amLODipine (NORVASC) 10 MG tablet Take 10 mg by mouth daily.  03/12/17   [provider]  atorvastatin (LIPITOR) 20 MG tablet Take 20 mg by mouth every evening.  01/27/17   [provider]  cholecalciferol (VITAMIN D3) 25 MCG (1000 UNIT) tablet Take 1,000 Units by mouth daily.    [provider]  clopidogrel (PLAVIX) 75 MG tablet Take 75 mg by mouth daily.  04/30/13   [provider]  colchicine 0.6 MG tablet Take 0.6 mg by mouth daily.    [provider]  fexofenadine (ALLEGRA) 180 MG tablet Take 180 mg by mouth daily as needed. 11/25/21   [provider]  lidocaine-prilocaine (EMLA) cream Apply 1 application topically as needed. Patient not taking: Reported on 01/13/2022 03/09/21   Sindy Guadeloupe, MD  oxyCODONE (OXY IR/ROXICODONE) 5 MG immediate  release tablet TAKE 1 TABLET BY MOUTH EVERY 6 HOURS AS NEEDED FOR SEVERE PAIN Patient not taking: Reported on 05/11/2022 08/19/21   Sindy Guadeloupe, MD  traZODone (DESYREL) 50 MG tablet traZODone HCl 50 MG Oral Tablet QTY: 30 tablet Days: 30 Refills: 0  Written: 10/09/21 Patient Instructions: take 1 tablet by mouth nightly as needed for insomnia Patient not taking: Reported on 01/13/2022 10/09/21   [provider]  TRELEGY ELLIPTA 100-62.5-25 MCG/ACT AEPB Take 1 puff by mouth daily. 04/06/22   [provider]  zaleplon (SONATA) 5 MG capsule Take 5 mg by mouth at bedtime as needed. Patient not taking: Reported on 01/13/2022 10/02/21   [provider]  prochlorperazine (COMPAZINE) 10 MG tablet Take 1 tablet (10 mg total) by mouth every 6 (six) hours as needed (Nausea or vomiting). Patient not taking: Reported on 05/27/2020 05/21/20 07/27/20  Sindy Guadeloupe, MD    Physical Exam: Vitals:   07/29/22 1830 07/29/22 1930 07/29/22 1934 07/29/22 2000  BP: (!) 180/81 (!) 152/68  (!) 167/79  Pulse: (!) 102 (!) 113  (!) 106  Resp: (!) 29 (!) 27  (!) 28  Temp:   (!) 100.4 F (38 C)   TempSrc:   Oral   SpO2:  94%  96%  Weight:      Height:       Physical Exam Vitals and nursing note reviewed.  Constitutional:      Appearance: He is ill-appearing.  HENT:     Head: Normocephalic and atraumatic.  Cardiovascular:     Rate and Rhythm: Regular rhythm. Tachycardia present.     Heart sounds: Normal heart sounds.  Pulmonary:     Effort: Tachypnea present.     Breath sounds: Wheezing and rales present.  Abdominal:     Palpations: Abdomen is soft.     Tenderness: There is no abdominal tenderness.  Neurological:     Mental Status: Mental status is at baseline.     Labs on Admission: I have personally reviewed following labs and imaging studies  CBC: Recent Labs  Lab 07/29/22 1810  WBC 4.2  NEUTROABS 3.2  HGB 16.6  HCT 50.3  MCV 96.7  PLT 458*   Basic Metabolic  Panel: Recent Labs  Lab 07/29/22 1810  NA 140  K 3.8  CL 105  CO2 24  GLUCOSE 117*  BUN 18  CREATININE 1.78*  CALCIUM 8.7*   GFR: Estimated Creatinine Clearance: 44.8 mL/min (A) (by C-G formula based on SCr of 1.78 mg/dL (H)). Liver Function Tests: Recent Labs  Lab 07/29/22 1810  AST 23  ALT 19  ALKPHOS 57  BILITOT 0.9  PROT 7.5  ALBUMIN 3.7   No results for input(s): "LIPASE", "AMYLASE" in the last 168 hours. No results for input(s): "AMMONIA" in the last 168 hours. Coagulation Profile: Recent Labs  Lab 07/29/22 1810  INR 1.2   Cardiac Enzymes: No results for input(s): "CKTOTAL", "CKMB", "CKMBINDEX", "TROPONINI" in the last 168 hours. BNP (last 3 results) No results for input(s): "PROBNP" in the last 8760 hours. HbA1C: No results for input(s): "HGBA1C" in the  last 72 hours. CBG: No results for input(s): "GLUCAP" in the last 168 hours. Lipid Profile: No results for input(s): "CHOL", "HDL", "LDLCALC", "TRIG", "CHOLHDL", "LDLDIRECT" in the last 72 hours. Thyroid Function Tests: No results for input(s): "TSH", "T4TOTAL", "FREET4", "T3FREE", "THYROIDAB" in the last 72 hours. Anemia Panel: No results for input(s): "VITAMINB12", "FOLATE", "FERRITIN", "TIBC", "IRON", "RETICCTPCT" in the last 72 hours. Urine analysis:    Component Value Date/Time   COLORURINE YELLOW (A) 07/29/2022 1830   APPEARANCEUR CLOUDY (A) 07/29/2022 1830   LABSPEC 1.013 07/29/2022 1830   PHURINE 7.0 07/29/2022 1830   GLUCOSEU NEGATIVE 07/29/2022 1830   HGBUR NEGATIVE 07/29/2022 1830   [place] NEGATIVE 07/29/2022 1830   [place] 07/29/2022 1830   PROTEINUR 30 (A) 07/29/2022 1830   NITRITE NEGATIVE 07/29/2022 1830   LEUKOCYTESUR NEGATIVE 07/29/2022 1830    Radiological Exams on Admission: DG Chest Port 1 View  Result Date: 07/29/2022 CLINICAL DATA:  Questionable sepsis EXAM: PORTABLE CHEST 1 VIEW COMPARISON:  04/11/2020 FINDINGS: Cardiomegaly. Right chest port catheter. Mild  diffuse bilateral interstitial pulmonary opacity. Unchanged post treatment appearance of the paramedian right upper lobe. The visualized skeletal structures are unremarkable. IMPRESSION: 1. Cardiomegaly with mild diffuse bilateral interstitial pulmonary opacity, likely edema. 2. Unchanged post treatment appearance of the paramedian right upper lobe. Electronically Signed   By: Delanna Ahmadi M.D.   On: 07/29/2022 18:23     Data Reviewed: Relevant notes from primary care and specialist visits, past discharge summaries as available in EHR, including Care Everywhere. Prior diagnostic testing as pertinent to current admission diagnoses Updated medications and problem lists for reconciliation ED course, including vitals, labs, imaging, treatment and response to treatment Triage notes, nursing and pharmacy notes and ED provider's notes Notable results as noted in HPI   Assessment and Plan: * Influenza A with pneumonia Acute respiratory failure with hypoxia Sepsis Sepsis criteria: Fever, tachycardia.  Normal WBC but lactic acidosis of 2.1 as well as AKI and respiratory failure Patient with increasing shortness of breath requiring 3 L to maintain sats 94-96 Influenza A positive, COVID and RSV negative Continue sepsis fluids Tamiflu 75 twice daily Supplemental oxygen to keep sats over 94% Antitussives, incentive spirometry DuoNebs as needed  Acute respiratory failure with hypoxia (HCC) Related to influenza A and sepsis  Lower suspicion for mild fluid overload as well given findings on chest x-ray.  With history of cancer, PE also in the differential We will get baseline echocardiogram as well as BNP.  No prior history of heart disease Patient got a dose of Lasix in the ED Daily weight Monitor for fluid overload  AKI (acute kidney injury) ([place]) Creatinine 1.78 above baseline of 1.0 couple months prior, likely related to sepsis Expecting improvement with IV hydration Monitor renal function  and avoid nephrotoxins  History of bladder cancer s/p neoadjuvant chemotherapy and cystoprostatectomy S/p ileal conduit 2012 Last seen by urology in April 2023  Malignant neoplasm of lung [facility]) s/p chemoradiation, currently in remission, last seen by oncology 05/11/2022,  PVD (peripheral vascular disease) ([place]) Continue atorvastatin  Essential hypertension Continue amlodipine     DVT prophylaxis: Lovenox  Consults: none  Advance Care Planning:   Code Status: Prior   Family Communication: none  Disposition Plan: Back to previous home environment  Severity of Illness: The appropriate patient status for this patient is INPATIENT. Inpatient status is judged to be reasonable and necessary in order to provide the required intensity of service to ensure the patient's safety. The patient's presenting symptoms,  physical exam findings, and initial radiographic and laboratory data in the context of their chronic comorbidities is felt to place them at high risk for further clinical deterioration. Furthermore, it is not anticipated that the patient will be medically stable for discharge from the hospital within 2 midnights of admission.   * I certify that at the point of admission it is my clinical judgment that the patient will require inpatient hospital care spanning beyond 2 midnights from the point of admission due to high intensity of service, high risk for further deterioration and high frequency of surveillance required.*  Author: Athena Masse, MD 07/29/2022 8:54 PM  For on call review [email or web address].

## 2022-07-29 NOTE — Assessment & Plan Note (Signed)
Continue atorvastatin

## 2022-07-29 NOTE — ED Triage Notes (Signed)
BIBA from home c/o progressive cough x months worsening over the past few days.   EMS reports fever of 100.4, hypoxia requiring 3L and HTN.   Pt DOES NOT wear oxygen at home.  PMH lung CA., urostomy bag

## 2022-07-29 NOTE — Assessment & Plan Note (Signed)
S/p ileal conduit 2012 Last seen by urology in April 2023

## 2022-07-30 ENCOUNTER — Encounter: Payer: Self-pay | Admitting: Internal Medicine

## 2022-07-30 ENCOUNTER — Inpatient Hospital Stay (HOSPITAL_COMMUNITY)
Admit: 2022-07-30 | Discharge: 2022-07-30 | Disposition: A | Discharge status: Home / Self Care | Payer: Medicare Other | Attending: Internal Medicine | Admitting: Internal Medicine

## 2022-07-30 DIAGNOSIS — I509 Heart failure, unspecified: Secondary | ICD-10-CM | POA: Diagnosis not present

## 2022-07-30 DIAGNOSIS — J09X1 Influenza due to identified novel influenza A virus with pneumonia: Secondary | ICD-10-CM | POA: Diagnosis not present

## 2022-07-30 DIAGNOSIS — N1832 Chronic kidney disease, stage 3b: Secondary | ICD-10-CM | POA: Insufficient documentation

## 2022-07-30 LAB — BASIC METABOLIC PANEL
Anion gap: 10 (ref 5–15)
BUN: 21 mg/dL (ref 8–23)
CO2: 26 mmol/L (ref 22–32)
Calcium: 7.9 mg/dL — ABNORMAL LOW (ref 8.9–10.3)
Chloride: 102 mmol/L (ref 98–111)
Creatinine, Ser: 1.89 mg/dL — ABNORMAL HIGH (ref 0.61–1.24)
GFR, Estimated: 37 mL/min — ABNORMAL LOW (ref >60)
Glucose, Bld: 115 mg/dL — ABNORMAL HIGH (ref 70–99)
Potassium: 3.3 mmol/L — ABNORMAL LOW (ref 3.5–5.1)
Sodium: 138 mmol/L (ref 135–145)

## 2022-07-30 LAB — ECHOCARDIOGRAM COMPLETE
AR max vel: 2.71 cm2
AV Area VTI: 2.54 cm2
AV Area mean vel: 2.38 cm2
AV Mean grad: 4 mmHg
AV Peak grad: 6.2 mmHg
Ao pk vel: 1.24 m/s
Area-P 1/2: 3.27 cm2
Height: 71 in
MV VTI: 3.46 cm2
S' Lateral: 3.1 cm
Weight: 3360 oz

## 2022-07-30 LAB — URINE CULTURE

## 2022-07-30 LAB — CORTISOL-AM, BLOOD: Cortisol - AM: 16.6 ug/dL (ref 6.7–22.6)

## 2022-07-30 LAB — LACTIC ACID, PLASMA
Lactic Acid, Venous: 2 mmol/L (ref 0.5–1.9)
Lactic Acid, Venous: 1.5 mmol/L (ref 0.5–1.9)

## 2022-07-30 LAB — CBC
HCT: 49.5 % (ref 39.0–52.0)
Hemoglobin: 16.2 g/dL (ref 13.0–17.0)
MCH: 32 pg (ref 26.0–34.0)
MCHC: 32.7 g/dL (ref 30.0–36.0)
MCV: 97.6 fL (ref 80.0–100.0)
Platelets: 112 10*3/uL — ABNORMAL LOW (ref 150–400)
RBC: 5.07 MIL/uL (ref 4.22–5.81)
RDW: 13.1 % (ref 11.5–15.5)
WBC: 4 10*3/uL (ref 4.0–10.5)
nRBC: 0 % (ref 0.0–0.2)

## 2022-07-30 LAB — PROCALCITONIN: Procalcitonin: 0.39 ng/mL

## 2022-07-30 MED ORDER — LABETALOL HCL 5 MG/ML IV SOLN: 20.0000 mg | INTRAVENOUS | Status: DC | PRN

## 2022-07-30 MED ORDER — GUAIFENESIN-DM 100-10 MG/5ML PO SYRP
5.0000 mL | ORAL_SOLUTION | ORAL | Status: DC | PRN
  Administered 2022-07-30 – 2022-08-01 (×5): 5 mL via ORAL
  Filled 2022-07-30 (×5): qty 10

## 2022-07-30 MED ORDER — CLOPIDOGREL BISULFATE 75 MG PO TABS
75.0000 mg | ORAL_TABLET | Freq: Every day | ORAL | Status: DC
  Administered 2022-07-30 – 2022-08-02 (×4): 75 mg via ORAL
  Filled 2022-07-30 (×4): qty 1

## 2022-07-30 NOTE — Progress Notes (Addendum)
PROGRESS NOTE    Martin Jennings  HYW:[id number] DOB: 31-Jul-1950 DOA: 07/29/2022 PCP: Perrin Maltese, MD  Outpatient Specialists: oncology    Brief Narrative:   From admission h and p  Martin Jennings is a 72 y.o. male with medical history significant for bladder cancer s/p neoadjuvant chemotherapy and radical cystoprostatectomy with ileal conduit August 2012 , hypertension, peripheral vascular disease, metastatic non-small cell adenocarcinoma of the lung s/p chemoradiation, currently in remission, last seen by oncology 05/11/2022, who was brought in by EMS with a several day history of a worsening cough.  Patient has had a cough for a month.  With EMS he had a temperature of 100.4 and required 3 L to maintain sats in the mid 90s.  He denies chest pain, vomiting, abdominal pain or diarrhea.   Assessment & Plan:   Principal Problem:   Influenza A with pneumonia Active Problems:   Acute respiratory failure with hypoxia ([place])   Sepsis ([place])   AKI (acute kidney injury) ([place])   Tobacco use disorder   Essential hypertension   PVD (peripheral vascular disease) ([place])   Malignant neoplasm of lung ([place])   History of bladder cancer s/p neoadjuvant chemotherapy and cystoprostatectomy   History of ileal conduit 2012   Stage 3b chronic kidney disease (CKD) (HCC)  # Acute hypoxic respiratory failure Desats to low 80s off oxygen, currently requiring 2 L - [place] O2, wean as able  # Influenza A # Sepsis With hypoxia as above. Sepsis by tachycardia, elevated lactate. Lactate has normalized. Underlying lung cancer, smoking, complicate course - cont tamiflu - check dimer, is at risk for PE  # Debility - PT advising HH PT, ordered  # HTN Here with elevated BPs - cont home amlodipine - labetalol prn  # CKD 3b Cr 1.78 appears to be at baseline - monitor  # Thrombocytopenia Likely reactive 2/2 infection - monitor for now  # Elevated BNP Mildly elevated at 120s, does have pulm edema on cxr,  no peripheral edema. No heart failure diagnosis - TTE pending  # Hx bladder cancer, s/p cystoprostatectomy, s/p ileal conduit - ostomy consult  # Lung cancer S/p chemoradiation, now in remission  - outpt onc f/u  # PAD - cont home plavix, statin   DVT prophylaxis: lovenox Code Status: full Family Communication: wife updated telephonically 1/5  Level of care: med/surg Status is: Inpatient    Consultants:  none  Procedures: none  Antimicrobials:  none    Subjective: Remains sob, complains of coughing  Objective: Vitals:   07/30/22 0500 07/30/22 0700 07/30/22 0800 07/30/22 0900  BP: (!) 170/83 (!) 171/91 (!) 170/91 (!) 179/99  Pulse: 83 82 76 93  Resp: (!) 26 (!) 21 (!) 24 (!) 27  Temp:      TempSrc:      SpO2: 95%     Weight:      Height:        Intake/Output Summary (Last 24 hours) at 07/30/2022 0928 Last data filed at 07/30/2022 0439 Gross per 24 hour  Intake --  Output 600 ml  Net -600 ml   Filed Weights   07/29/22 1755  Weight: 95.3 kg    Examination:  General exam: Appears calm and comfortable  Respiratory system: rhonchi throughout Cardiovascular system: S1 & S2 heard, tachycardic. No JVD, murmurs, rubs, gallops or clicks.  Gastrointestinal system: Abdomen is nondistended, soft and nontender. No organomegaly or masses felt. Normal bowel sounds heard. Central nervous system: Alert and oriented. No  focal neurological deficits. Extremities: Symmetric 5 x 5 power. No edema Skin: No rashes, lesions or ulcers Psychiatry: Judgement and insight appear normal. Mood & affect appropriate.     Data Reviewed: I have personally reviewed following labs and imaging studies  CBC: Recent Labs  Lab 07/29/22 1810 07/30/22 0436  WBC 4.2 4.0  NEUTROABS 3.2  --   HGB 16.6 16.2  HCT 50.3 49.5  MCV 96.7 97.6  PLT 120* 546*   Basic Metabolic Panel: Recent Labs  Lab 07/29/22 1810 07/30/22 0436  NA 140 138  K 3.8 3.3*  CL 105 102  CO2 24 26   GLUCOSE 117* 115*  BUN 18 21  CREATININE 1.78* 1.89*  CALCIUM 8.7* 7.9*   GFR: Estimated Creatinine Clearance: 42.2 mL/min (A) (by C-G formula based on SCr of 1.89 mg/dL (H)). Liver Function Tests: Recent Labs  Lab 07/29/22 1810  AST 23  ALT 19  ALKPHOS 57  BILITOT 0.9  PROT 7.5  ALBUMIN 3.7   No results for input(s): "LIPASE", "AMYLASE" in the last 168 hours. No results for input(s): "AMMONIA" in the last 168 hours. Coagulation Profile: Recent Labs  Lab 07/29/22 1810  INR 1.2   Cardiac Enzymes: No results for input(s): "CKTOTAL", "CKMB", "CKMBINDEX", "TROPONINI" in the last 168 hours. BNP (last 3 results) No results for input(s): "PROBNP" in the last 8760 hours. HbA1C: No results for input(s): "HGBA1C" in the last 72 hours. CBG: No results for input(s): "GLUCAP" in the last 168 hours. Lipid Profile: No results for input(s): "CHOL", "HDL", "LDLCALC", "TRIG", "CHOLHDL", "LDLDIRECT" in the last 72 hours. Thyroid Function Tests: No results for input(s): "TSH", "T4TOTAL", "FREET4", "T3FREE", "THYROIDAB" in the last 72 hours. Anemia Panel: No results for input(s): "VITAMINB12", "FOLATE", "FERRITIN", "TIBC", "IRON", "RETICCTPCT" in the last 72 hours. Urine analysis:    Component Value Date/Time   COLORURINE YELLOW (A) 07/29/2022 1830   APPEARANCEUR CLOUDY (A) 07/29/2022 1830   LABSPEC 1.013 07/29/2022 1830   PHURINE 7.0 07/29/2022 1830   GLUCOSEU NEGATIVE 07/29/2022 1830   HGBUR NEGATIVE 07/29/2022 1830   [place] NEGATIVE 07/29/2022 1830   [place] 07/29/2022 1830   PROTEINUR 30 (A) 07/29/2022 1830   NITRITE NEGATIVE 07/29/2022 1830   LEUKOCYTESUR NEGATIVE 07/29/2022 1830   Sepsis Labs: @LABRCNTIP (procalcitonin:4,lacticidven:4)  ) Recent Results (from the past 240 hour(s))  Resp panel by RT-PCR (RSV, Flu A&B, Covid) Anterior Nasal Swab     Status: Abnormal   Collection Time: 07/29/22  6:08 PM   Specimen: Anterior Nasal Swab  Result Value Ref  Range Status   SARS Coronavirus 2 by RT PCR NEGATIVE NEGATIVE Final    Comment: (NOTE) SARS-CoV-2 target nucleic acids are NOT DETECTED.  The SARS-CoV-2 RNA is generally detectable in upper respiratory specimens during the acute phase of infection. The lowest concentration of SARS-CoV-2 viral copies this assay can detect is 138 copies/mL. A negative result does not preclude SARS-Cov-2 infection and should not be used as the sole basis for treatment or other patient management decisions. A negative result may occur with  improper specimen collection/handling, submission of specimen other than nasopharyngeal swab, presence of viral mutation(s) within the areas targeted by this assay, and inadequate number of viral copies(<138 copies/mL). A negative result must be combined with clinical observations, patient history, and epidemiological information. The expected result is Negative.  Fact Sheet for Patients:  EntrepreneurPulse.com.au  Fact Sheet for Healthcare Providers:  IncredibleEmployment.be  This test is no t yet approved or cleared by the [place] FDA  and  has been authorized for detection and/or diagnosis of SARS-CoV-2 by FDA under an Emergency Use Authorization (EUA). This EUA will remain  in effect (meaning this test can be used) for the duration of the COVID-19 declaration under Section 564(b)(1) of the Act, 21 U.S.C.section 360bbb-3(b)(1), unless the authorization is terminated  or revoked sooner.       Influenza A by PCR POSITIVE (A) NEGATIVE Final   Influenza B by PCR NEGATIVE NEGATIVE Final    Comment: (NOTE) The Xpert Xpress SARS-CoV-2/FLU/RSV plus assay is intended as an aid in the diagnosis of influenza from Nasopharyngeal swab specimens and should not be used as a sole basis for treatment. Nasal washings and aspirates are unacceptable for Xpert Xpress SARS-CoV-2/FLU/RSV testing.  Fact Sheet for  Patients: EntrepreneurPulse.com.au  Fact Sheet for Healthcare Providers: IncredibleEmployment.be  This test is not yet approved or cleared by the [place] FDA and has been authorized for detection and/or diagnosis of SARS-CoV-2 by FDA under an Emergency Use Authorization (EUA). This EUA will remain in effect (meaning this test can be used) for the duration of the COVID-19 declaration under Section 564(b)(1) of the Act, 21 U.S.C. section 360bbb-3(b)(1), unless the authorization is terminated or revoked.     Resp Syncytial Virus by PCR NEGATIVE NEGATIVE Final    Comment: (NOTE) Fact Sheet for Patients: EntrepreneurPulse.com.au  Fact Sheet for Healthcare Providers: IncredibleEmployment.be  This test is not yet approved or cleared by the [place] FDA and has been authorized for detection and/or diagnosis of SARS-CoV-2 by FDA under an Emergency Use Authorization (EUA). This EUA will remain in effect (meaning this test can be used) for the duration of the COVID-19 declaration under Section 564(b)(1) of the Act, 21 U.S.C. section 360bbb-3(b)(1), unless the authorization is terminated or revoked.  Performed at [facility], [address] [place], [place] [address]   Blood Culture (routine x 2)     Status: None (Preliminary result)   Collection Time: 07/29/22  6:10 PM   Specimen: BLOOD  Result Value Ref Range Status   Specimen Description BLOOD BLOOD RIGHT ARM  Final   Special Requests   Final    BOTTLES DRAWN AEROBIC AND ANAEROBIC Blood Culture adequate volume   Culture   Final    NO GROWTH < 12 HOURS Performed at [facility], [address] [place], [place] [address]    Report Status PENDING  Incomplete  Blood Culture (routine x 2)     Status: None (Preliminary result)   Collection Time: 07/29/22  6:11 PM   Specimen: BLOOD  Result Value Ref Range Status   Specimen  Description BLOOD BLOOD LEFT ARM  Final   Special Requests BOTTLES DRAWN AEROBIC AND ANAEROBIC BCAV  Final   Culture   Final    NO GROWTH < 12 HOURS Performed at [facility], [address] [place], [place] [address]    Report Status PENDING  Incomplete         Radiology Studies: DG Chest Port 1 View  Result Date: 07/29/2022 CLINICAL DATA:  Questionable sepsis EXAM: PORTABLE CHEST 1 VIEW COMPARISON:  04/11/2020 FINDINGS: Cardiomegaly. Right chest port catheter. Mild diffuse bilateral interstitial pulmonary opacity. Unchanged post treatment appearance of the paramedian right upper lobe. The visualized skeletal structures are unremarkable. IMPRESSION: 1. Cardiomegaly with mild diffuse bilateral interstitial pulmonary opacity, likely edema. 2. Unchanged post treatment appearance of the paramedian right upper lobe. Electronically Signed   By: Delanna Ahmadi M.D.   On: 07/29/2022  18:23        Scheduled Meds:  amLODipine  10 mg Oral Daily   atorvastatin  20 mg Oral QPM   enoxaparin (LOVENOX) injection  40 mg Subcutaneous QHS   oseltamivir  30 mg Oral BID   Continuous Infusions:   LOS: 1 day     Desma Maxim, MD [facility]   If 7PM-7AM, please contact night-coverage [email or web address] Password [facility] 07/30/2022, 9:28 AM

## 2022-07-30 NOTE — TOC Initial Note (Signed)
Transition of Care [facility]) - Initial/Assessment Note    Patient Details  Name: Martin Jennings MRN: [id number] Date of Birth: 1951-02-21  [facility]) CM/SW Contact:    Shelbie Hutching, RN Phone Number: 07/30/2022, 2:58 PM  Clinical Narrative:                 Patient admitted to the hospital with influenza A pneumonia.  RNCM met with patient at the bedside.  Patient is from home where he lives with his wife and is normally independent and drives.  Patient is current with PCP and gets prescriptions from [facility].   PT worked with patient earlier today and recommended [facility], patient agrees with having PT arranged and does not have a preference in agency.  Referral given to Feliberto Harts with [facility] for PT.  He says they should be able to accept.  TOC will cont to follow.  Patient is currently on acute O2 [place] 3L.  Expected Discharge Plan: [place] Barriers to Discharge: Continued Medical Work up   Patient Goals and CMS Choice Patient states their goals for this hospitalization and ongoing recovery are:: patient wants to get better and go home CMS Medicare.gov Compare Post Acute Care list provided to:: Patient Choice offered to / list presented to : Patient      Expected Discharge Plan and Services   Discharge Planning Services: CM Consult Post Acute Care Choice: [place] arrangements for the past 2 months: Single Family Home                 DME Arranged: N/A DME Agency: NA       HH Arranged: PT          Prior Living Arrangements/Services Living arrangements for the past 2 months: Single Family Home Lives with:: Spouse Patient language and need for interpreter reviewed:: Yes Do you feel safe going back to the place where you live?: Yes      Need for Family Participation in Patient Care: Yes (Comment) Care giver support system in place?: Yes (comment)   Criminal Activity/Legal Involvement Pertinent to Current  Situation/Hospitalization: No - Comment as needed  Activities of Daily Living Home Assistive Devices/Equipment: None ADL Screening (condition at time of admission) Patient's cognitive ability adequate to safely complete daily activities?: Yes Is the patient deaf or have difficulty hearing?: No Does the patient have difficulty seeing, even when wearing glasses/contacts?: No Does the patient have difficulty concentrating, remembering, or making decisions?: No Patient able to express need for assistance with ADLs?: Yes Does the patient have difficulty dressing or bathing?: No Independently performs ADLs?: Yes (appropriate for developmental age) Does the patient have difficulty walking or climbing stairs?: No Weakness of Legs: None Weakness of Arms/Hands: None  Permission Sought/Granted Permission sought to share information with : Case Manager, Family Supports, Other (comment) Permission granted to share information with : Yes, Verbal Permission Granted  Share Information with NAME: Martin Jennings  Permission granted to share info w AGENCY: home health agency  Permission granted to share info w Relationship: spouse  Permission granted to share info w Contact Information: [phone]  Emotional Assessment Appearance:: Appears stated age Attitude/Demeanor/Rapport: Engaged Affect (typically observed): Accepting Orientation: : Oriented to Self, Oriented to Place, Oriented to  Time, Oriented to Situation Alcohol / Substance Use: Not Applicable Psych Involvement: No (comment)  Admission diagnosis:  Influenza A with pneumonia [J09.X1] Patient Active Problem List   Diagnosis Date Noted  Stage 3b chronic kidney disease (CKD) ([place]) 07/30/2022   Influenza A with pneumonia 07/29/2022   Acute respiratory failure with hypoxia ([place]) 07/29/2022   Sepsis ([place]) 07/29/2022   History of ileal conduit 2012 07/29/2022   Prediabetes 03/21/2021   Chemotherapy induced neutropenia ([place]) 06/25/2020    Goals of care, counseling/discussion 05/19/2020   Malignant neoplasm of lung ([place]) 05/19/2020   CAP (community acquired pneumonia) 04/04/2020   AKI (acute kidney injury) ([place]) 04/04/2020   PVD (peripheral vascular disease) ([place]) 04/03/2020   Atherosclerosis of native arteries of extremity with rest pain ([place]) 04/03/2017   Atherosclerosis of native arteries of extremity with intermittent claudication ([place]) 01/25/2017   Leg pain 01/25/2017   Essential hypertension 01/25/2017   Hyperlipidemia 01/25/2017   Chronic kidney disease 06/02/2016   Nicotine dependence 06/02/2016   History of bladder cancer s/p neoadjuvant chemotherapy and cystoprostatectomy 06/02/2016   Tobacco use disorder 03/29/2011   Embolism and thrombosis of iliac artery ([place]) 02/26/2011   Malignant neoplasm of bladder, unspecified ([place]) 12/17/2010   PCP:  Martin Maltese, MD Pharmacy:   [place], [place] - [address] [place] [address] Phone: [phone] Fax: [phone]     Social Determinants of Health (SDOH) Social History: SDOH Screenings   Food Insecurity: No Food Insecurity (07/30/2022)  Housing: Low Risk  (07/30/2022)  Transportation Needs: No Transportation Needs (07/30/2022)  Utilities: Not At Risk (07/30/2022)  Tobacco Use: High Risk (07/30/2022)   SDOH Interventions:     Readmission Risk Interventions     No data to display

## 2022-07-30 NOTE — Evaluation (Signed)
Physical Therapy Evaluation Patient Details Name: Martin Jennings MRN: [id number] DOB: 05-Jan-1951 Today's Date: 07/30/2022  History of Present Illness  Martin Jennings is a 37yoM who is [facility] at [facility] with Flu and PNA, has been achy, weak, SOB at home.  Clinical Impression  [name] is seen in ED, asleep in gurney, O2 donned, but he is heavily congested and really unable to use nose for any appreciable inhalation. Pt agreeable to evaluation. He has body aches and fatigue but moves generally well today. He denies any frank loss of mobility or balance while he has been sick. Require no physical assist OOB to chair today, no device used. Pt is glad to sit up in chair and take his meal. Recommend HHPT at DC not knowing how long her will be here in hospital and whether he will sustain additional impairment of mobility through the course of this infection. Pt reports his DTR was sick last week but better now, reports both wife and DTR are capable of providing assist at DC.      Recommendations for follow up therapy are one component of a multi-disciplinary discharge planning process, led by the attending physician.  Recommendations may be updated based on patient status, additional functional criteria and insurance authorization.  Follow Up Recommendations Home health PT      Assistance Recommended at Discharge None  Patient can return home with the following  Help with stairs or ramp for entrance;Assistance with cooking/housework;Assist for transportation    Equipment Recommendations None recommended by PT  Recommendations for Other Services       Functional Status Assessment Patient has had a recent decline in their functional status and demonstrates the ability to make significant improvements in function in a reasonable and predictable amount of time.     Precautions / Restrictions Precautions Precautions: Fall Restrictions Weight Bearing Restrictions: No      Mobility  Bed  Mobility Overal bed mobility: Modified Independent                  Transfers Overall transfer level: Modified independent Equipment used: None                    Ambulation/Gait Ambulation/Gait assistance:  (deferred at this time, denies any gross dysfunction of gait while sick recently, appears balanced with stance and transfer, room in ED not optimized for mobility)                Stairs            Wheelchair Mobility    Modified Rankin (Stroke Patients Only)       Balance                                             Pertinent Vitals/Pain Pain Assessment Pain Assessment: No/denies pain ("no, just some body aches")    Home Living Family/patient expects to be discharged to:: Private residence Living Arrangements: Spouse/significant other;Children Available Help at Discharge: Family (lives with wife and DTR; DTR sick last week, now better.) Type of Home: House Home Access: Stairs to enter Entrance Stairs-Rails: Right Entrance Stairs-Number of Steps: 5   Home Layout: One level Home Equipment: Grab bars - toilet;Grab bars - tub/shower;Kasandra Knudsen - single point      Prior Function               Mobility  Comments: says he has hip pain that interferes with AMB >166ft, resolves with sitting; does not use device typically       Hand Dominance        Extremity/Trunk Assessment                Communication      Cognition Arousal/Alertness: Awake/alert Behavior During Therapy: WFL for tasks assessed/performed Overall Cognitive Status: Within Functional Limits for tasks assessed                                          General Comments      Exercises     Assessment/Plan    PT Assessment Patient needs continued PT services  PT Problem List Decreased activity tolerance;Decreased balance       PT Treatment Interventions Gait training;Functional mobility training;Therapeutic  activities;Patient/family education;Stair training;Therapeutic exercise;DME instruction    PT Goals (Current goals can be found in the Care Plan section)  Acute Rehab PT Goals Patient Stated Goal: feel better and tolerate ad lib mobility again PT Goal Formulation: With patient Time For Goal Achievement: 08/13/22 Potential to Achieve Goals: Good    Frequency Min 2X/week     Co-evaluation               AM-PAC PT "6 Clicks" Mobility  Outcome Measure Help needed turning from your back to your side while in a flat bed without using bedrails?: None Help needed moving from lying on your back to sitting on the side of a flat bed without using bedrails?: None Help needed moving to and from a bed to a chair (including a wheelchair)?: None Help needed standing up from a chair using your arms (e.g., wheelchair or bedside chair)?: None Help needed to walk in hospital room?: A Little Help needed climbing 3-5 steps with a railing? : A Little 6 Click Score: 22    End of Session Equipment Utilized During Treatment: Oxygen Activity Tolerance: Patient tolerated treatment well;No increased pain Patient left: in chair;with call bell/phone within reach;Other (comment) (lunch tray presented) Nurse Communication: Mobility status PT Visit Diagnosis: Other abnormalities of gait and mobility (R26.89);Muscle weakness (generalized) (M62.81)    Time: 8841-6606 PT Time Calculation (min) (ACUTE ONLY): 20 min   Charges:   PT Evaluation $PT Eval Low Complexity: 1 Low         1:32 PM, 07/30/22 Etta Grandchild, PT, DPT Physical Therapist - [facility]  [phone] ([place])    Vivia Rosenburg C 07/30/2022, 1:29 PM

## 2022-07-30 NOTE — Consult Note (Addendum)
[place] Nurse ostomy consult note Consult requested for urostomy supplies. Performed remotely after review of progress notes.  Pt had Urostomy for 5 years prior to this admission..  Supplies ordered for staff nurses of patient use while he is in the hospital: Use Supplies: barrier ring, Kellie Simmering # [phone] and urostomy pouch [place] # [phone] and drain adapter Kellie Simmering # [id number] Please re-consult if further assistance is needed.  Thank-you,  Julien Girt MSN, [place], [place], [place], [place]

## 2022-07-30 NOTE — Progress Notes (Signed)
*  PRELIMINARY RESULTS* Echocardiogram 2D Echocardiogram has been performed.  Martin Jennings 07/30/2022, 1:43 PM

## 2022-07-31 DIAGNOSIS — J09X1 Influenza due to identified novel influenza A virus with pneumonia: Secondary | ICD-10-CM | POA: Diagnosis not present

## 2022-07-31 LAB — BASIC METABOLIC PANEL
Anion gap: 10 (ref 5–15)
BUN: 27 mg/dL — ABNORMAL HIGH (ref 8–23)
CO2: 26 mmol/L (ref 22–32)
Calcium: 8.3 mg/dL — ABNORMAL LOW (ref 8.9–10.3)
Chloride: 101 mmol/L (ref 98–111)
Creatinine, Ser: 1.78 mg/dL — ABNORMAL HIGH (ref 0.61–1.24)
GFR, Estimated: 40 mL/min — ABNORMAL LOW (ref >60)
Glucose, Bld: 92 mg/dL (ref 70–99)
Potassium: 3.7 mmol/L (ref 3.5–5.1)
Sodium: 137 mmol/L (ref 135–145)

## 2022-07-31 LAB — CBC
HCT: 49.6 % (ref 39.0–52.0)
Hemoglobin: 16.2 g/dL (ref 13.0–17.0)
MCH: 31.5 pg (ref 26.0–34.0)
MCHC: 32.7 g/dL (ref 30.0–36.0)
MCV: 96.3 fL (ref 80.0–100.0)
Platelets: 118 10*3/uL — ABNORMAL LOW (ref 150–400)
RBC: 5.15 MIL/uL (ref 4.22–5.81)
RDW: 13 % (ref 11.5–15.5)
WBC: 3 10*3/uL — ABNORMAL LOW (ref 4.0–10.5)
nRBC: 0 % (ref 0.0–0.2)

## 2022-07-31 LAB — D-DIMER, QUANTITATIVE: D-Dimer, Quant: 1.31 ug/mL-FEU — ABNORMAL HIGH (ref 0.00–0.50)

## 2022-07-31 MED ORDER — MELATONIN 5 MG PO TABS
5.0000 mg | ORAL_TABLET | Freq: Every day | ORAL | Status: DC
  Administered 2022-07-31: 5 mg via ORAL
  Filled 2022-07-31: qty 1

## 2022-07-31 NOTE — Progress Notes (Incomplete)
SATURATION QUALIFICATIONS: (This note is used to comply with regulatory documentation for home oxygen)  Patient Saturations on Room Air at Rest = ***%  Patient Saturations on Room Air while Ambulating = 77%  Patient Saturations on *** Liters of oxygen while Ambulating = ***%  Please briefly explain why patient needs home oxygen:

## 2022-07-31 NOTE — Progress Notes (Signed)
PROGRESS NOTE    Martin Jennings  YTK:[id number] DOB: November 05, 1950 DOA: 07/29/2022 PCP: Perrin Maltese, MD  Outpatient Specialists: oncology    Brief Narrative:   From admission h and p  Martin Jennings is a 72 y.o. male with medical history significant for bladder cancer s/p neoadjuvant chemotherapy and radical cystoprostatectomy with ileal conduit August 2012 , hypertension, peripheral vascular disease, metastatic non-small cell adenocarcinoma of the lung s/p chemoradiation, currently in remission, last seen by oncology 05/11/2022, who was brought in by EMS with a several day history of a worsening cough.  Patient has had a cough for a month.  With EMS he had a temperature of 100.4 and required 3 L to maintain sats in the mid 90s.  He denies chest pain, vomiting, abdominal pain or diarrhea.   Assessment & Plan:   Principal Problem:   Influenza A with pneumonia Active Problems:   Acute respiratory failure with hypoxia ([place])   Sepsis ([place])   AKI (acute kidney injury) ([place])   Tobacco use disorder   Essential hypertension   PVD (peripheral vascular disease) ([place])   Malignant neoplasm of lung ([place])   History of bladder cancer s/p neoadjuvant chemotherapy and cystoprostatectomy   History of ileal conduit 2012   Stage 3b chronic kidney disease (CKD) ([facility])  # Acute hypoxic respiratory failure Desats to low 80s off oxygen, currently requiring 3 L - [place] O2, wean as able  # Influenza A # Sepsis With hypoxia as above. Sepsis by tachycardia, elevated lactate. Lactate has normalized. Underlying lung cancer, smoking, complicate course. Symptomatically improving. Dimer elevated but given improvement in symptoms holding off on vq scan for now - cont tamiflu  # Debility - PT advising HH PT, ordered  # HTN Bp now appropriate - cont home amlodipine - labetalol prn  # CKD 3b Cr 1.78 appears to be at baseline - monitor  # Thrombocytopenia Likely reactive 2/2 infection - monitor for  now  # Elevated BNP Mildly elevated at 120s, does have pulm edema on cxr, no peripheral edema. No heart failure diagnosis. Tte with g1 dd, no other abnormalities  # Hx bladder cancer, s/p cystoprostatectomy, s/p ileal conduit - ostomy nurse consult  # Lung cancer S/p chemoradiation, now in remission  - outpt onc f/u  # PAD - cont home plavix, statin   DVT prophylaxis: lovenox Code Status: full Family Communication: wife updated telephonically 1/6  Level of care: med/surg Status is: Inpatient    Consultants:  none  Procedures: none  Antimicrobials:  none    Subjective: Says energy improved, breathing improving. Ambulated with mobility without problem.  Objective: Vitals:   07/30/22 2105 07/31/22 0348 07/31/22 0426 07/31/22 0831  BP: (!) 148/83  135/77 122/77  Pulse: 76  85 80  Resp: 20  20 17   Temp: 98.5 F (36.9 C)  98.2 F (36.8 C) 97.7 F (36.5 C)  TempSrc: Oral     SpO2: 95%  96% 92%  Weight:  84.3 kg    Height:        Intake/Output Summary (Last 24 hours) at 07/31/2022 1308 Last data filed at 07/31/2022 0835 Gross per 24 hour  Intake 1000 ml  Output 600 ml  Net 400 ml   Filed Weights   07/29/22 1755 07/31/22 0348  Weight: 95.3 kg 84.3 kg    Examination:  General exam: Appears calm and comfortable  Respiratory system: few scattered rhonchi Cardiovascular system: S1 & S2 heard, tachycardic. No JVD, murmurs, rubs, gallops or  clicks.  Gastrointestinal system: Abdomen is nondistended, soft and nontender. No organomegaly or masses felt. Normal bowel sounds heard. Central nervous system: Alert and oriented. No focal neurological deficits. Extremities: Symmetric 5 x 5 power. No edema Skin: No rashes, lesions or ulcers Psychiatry: Judgement and insight appear normal. Mood & affect appropriate.     Data Reviewed: I have personally reviewed following labs and imaging studies  CBC: Recent Labs  Lab 07/29/22 1810 07/30/22 0436 07/31/22 0440   WBC 4.2 4.0 3.0*  NEUTROABS 3.2  --   --   HGB 16.6 16.2 16.2  HCT 50.3 49.5 49.6  MCV 96.7 97.6 96.3  PLT 120* 112* 944*   Basic Metabolic Panel: Recent Labs  Lab 07/29/22 1810 07/30/22 0436 07/31/22 0440  NA 140 138 137  K 3.8 3.3* 3.7  CL 105 102 101  CO2 24 26 26   GLUCOSE 117* 115* 92  BUN 18 21 27*  CREATININE 1.78* 1.89* 1.78*  CALCIUM 8.7* 7.9* 8.3*   GFR: Estimated Creatinine Clearance: 40.5 mL/min (A) (by C-G formula based on SCr of 1.78 mg/dL (H)). Liver Function Tests: Recent Labs  Lab 07/29/22 1810  AST 23  ALT 19  ALKPHOS 57  BILITOT 0.9  PROT 7.5  ALBUMIN 3.7   No results for input(s): "LIPASE", "AMYLASE" in the last 168 hours. No results for input(s): "AMMONIA" in the last 168 hours. Coagulation Profile: Recent Labs  Lab 07/29/22 1810  INR 1.2   Cardiac Enzymes: No results for input(s): "CKTOTAL", "CKMB", "CKMBINDEX", "TROPONINI" in the last 168 hours. BNP (last 3 results) No results for input(s): "PROBNP" in the last 8760 hours. HbA1C: No results for input(s): "HGBA1C" in the last 72 hours. CBG: No results for input(s): "GLUCAP" in the last 168 hours. Lipid Profile: No results for input(s): "CHOL", "HDL", "LDLCALC", "TRIG", "CHOLHDL", "LDLDIRECT" in the last 72 hours. Thyroid Function Tests: No results for input(s): "TSH", "T4TOTAL", "FREET4", "T3FREE", "THYROIDAB" in the last 72 hours. Anemia Panel: No results for input(s): "VITAMINB12", "FOLATE", "FERRITIN", "TIBC", "IRON", "RETICCTPCT" in the last 72 hours. Urine analysis:    Component Value Date/Time   COLORURINE YELLOW (A) 07/29/2022 1830   APPEARANCEUR CLOUDY (A) 07/29/2022 1830   LABSPEC 1.013 07/29/2022 1830   PHURINE 7.0 07/29/2022 1830   GLUCOSEU NEGATIVE 07/29/2022 1830   HGBUR NEGATIVE 07/29/2022 1830   [place] NEGATIVE 07/29/2022 1830   [place] 07/29/2022 1830   PROTEINUR 30 (A) 07/29/2022 1830   NITRITE NEGATIVE 07/29/2022 1830   LEUKOCYTESUR NEGATIVE  07/29/2022 1830   Sepsis Labs: @LABRCNTIP (procalcitonin:4,lacticidven:4)  ) Recent Results (from the past 240 hour(s))  Resp panel by RT-PCR (RSV, Flu A&B, Covid) Anterior Nasal Swab     Status: Abnormal   Collection Time: 07/29/22  6:08 PM   Specimen: Anterior Nasal Swab  Result Value Ref [place] Status   SARS Coronavirus 2 by RT PCR NEGATIVE NEGATIVE Final    Comment: (NOTE) SARS-CoV-2 target nucleic acids are NOT DETECTED.  The SARS-CoV-2 RNA is generally detectable in upper respiratory specimens during the acute phase of infection. The lowest concentration of SARS-CoV-2 viral copies this assay can detect is 138 copies/mL. A negative result does not preclude SARS-Cov-2 infection and should not be used as the sole basis for treatment or other patient management decisions. A negative result may occur with  improper specimen collection/handling, submission of specimen other than nasopharyngeal swab, presence of viral mutation(s) within the areas targeted by this assay, and inadequate number of viral copies(<138 copies/mL). A negative result  must be combined with clinical observations, patient history, and epidemiological information. The expected result is Negative.  Fact Sheet for Patients:  EntrepreneurPulse.com.au  Fact Sheet for Healthcare Providers:  IncredibleEmployment.be  This test is no t yet approved or cleared by the [place] FDA and  has been authorized for detection and/or diagnosis of SARS-CoV-2 by FDA under an Emergency Use Authorization (EUA). This EUA will remain  in effect (meaning this test can be used) for the duration of the COVID-19 declaration under Section 564(b)(1) of the Act, 21 U.S.C.section 360bbb-3(b)(1), unless the authorization is terminated  or revoked sooner.       Influenza A by PCR POSITIVE (A) NEGATIVE Final   Influenza B by PCR NEGATIVE NEGATIVE Final    Comment: (NOTE) The Xpert Xpress  SARS-CoV-2/FLU/RSV plus assay is intended as an aid in the diagnosis of influenza from Nasopharyngeal swab specimens and should not be used as a sole basis for treatment. Nasal washings and aspirates are unacceptable for Xpert Xpress SARS-CoV-2/FLU/RSV testing.  Fact Sheet for Patients: EntrepreneurPulse.com.au  Fact Sheet for Healthcare Providers: IncredibleEmployment.be  This test is not yet approved or cleared by the [place] FDA and has been authorized for detection and/or diagnosis of SARS-CoV-2 by FDA under an Emergency Use Authorization (EUA). This EUA will remain in effect (meaning this test can be used) for the duration of the COVID-19 declaration under Section 564(b)(1) of the Act, 21 U.S.C. section 360bbb-3(b)(1), unless the authorization is terminated or revoked.     Resp Syncytial Virus by PCR NEGATIVE NEGATIVE Final    Comment: (NOTE) Fact Sheet for Patients: EntrepreneurPulse.com.au  Fact Sheet for Healthcare Providers: IncredibleEmployment.be  This test is not yet approved or cleared by the [place] FDA and has been authorized for detection and/or diagnosis of SARS-CoV-2 by FDA under an Emergency Use Authorization (EUA). This EUA will remain in effect (meaning this test can be used) for the duration of the COVID-19 declaration under Section 564(b)(1) of the Act, 21 U.S.C. section 360bbb-3(b)(1), unless the authorization is terminated or revoked.  Performed at [facility], [address] [place], [place] [address]   Blood Culture (routine x 2)     Status: None (Preliminary result)   Collection Time: 07/29/22  6:10 PM   Specimen: BLOOD  Result Value Ref [place] Status   Specimen Description BLOOD BLOOD RIGHT ARM  Final   Special Requests   Final    BOTTLES DRAWN AEROBIC AND ANAEROBIC Blood Culture adequate volume   Culture   Final    NO GROWTH 2 DAYS Performed at  [facility], [address], [place], [place] [address]    Report Status PENDING  Incomplete  Blood Culture (routine x 2)     Status: None (Preliminary result)   Collection Time: 07/29/22  6:11 PM   Specimen: BLOOD  Result Value Ref [place] Status   Specimen Description BLOOD BLOOD LEFT ARM  Final   Special Requests BOTTLES DRAWN AEROBIC AND ANAEROBIC BCAV  Final   Culture   Final    NO GROWTH 2 DAYS Performed at [facility], [address] [place], [place] [address]    Report Status PENDING  Incomplete  Urine Culture     Status: Abnormal   Collection Time: 07/29/22  6:30 PM   Specimen: Urine, Random  Result Value Ref [place] Status   Specimen Description   Final    URINE, RANDOM Performed at [facility], [address], [place], [place] [address]  Special Requests   Final    NONE Performed at [facility], [address] [place], [place] [address]    Culture MULTIPLE SPECIES PRESENT, SUGGEST RECOLLECTION (A)  Final   Report Status 07/30/2022 FINAL  Final         Radiology Studies: ECHOCARDIOGRAM COMPLETE  Result Date: 07/30/2022    ECHOCARDIOGRAM REPORT   Patient Name:   ZACHRY HOPFENSPERGER Date of Exam: 07/30/2022 Medical Rec #:  [id number]      Height:       71.0 in Accession #:    [id number]     Weight:       210.0 lb Date of Birth:  1951/02/10      BSA:          2.153 m Patient Age:    64 years       BP:           179/99 mmHg Patient Gender: M              HR:           73 bpm. Exam Location:  [facility] Procedure: 2D Echo, Cardiac Doppler and Color Doppler Indications:     CHF  History:         Patient has no prior history of Echocardiogram examinations.                  Risk Factors:Hypertension, Dyslipidemia and Current Smoker. Flu                  +, Bladder CA, Lung CA.  Sonographer:     Wenda Low Referring Phys:  [id number] Athena Masse Diagnosing Phys: Ida Rogue MD  Sonographer Comments: Image acquisition challenging due to  respiratory motion. IMPRESSIONS  1. Left ventricular ejection fraction, by estimation, is 55 to 60%. The left ventricle has normal function. The left ventricle has no regional wall motion abnormalities. There is moderate left ventricular hypertrophy. Left ventricular diastolic parameters are consistent with Grade I diastolic dysfunction (impaired relaxation).  2. Right ventricular systolic function is normal. The right ventricular size is normal. Tricuspid regurgitation signal is inadequate for assessing PA pressure.  3. The mitral valve is normal in structure. No evidence of mitral valve regurgitation. No evidence of mitral stenosis.  4. The aortic valve has an indeterminant number of cusps. Aortic valve regurgitation is not visualized. Aortic valve sclerosis is present, with no evidence of aortic valve stenosis.  5. The inferior vena cava is normal in size with greater than 50% respiratory variability, suggesting right atrial pressure of 3 mmHg. FINDINGS  Left Ventricle: Left ventricular ejection fraction, by estimation, is 55 to 60%. The left ventricle has normal function. The left ventricle has no regional wall motion abnormalities. The left ventricular internal cavity size was normal in size. There is  moderate left ventricular hypertrophy. Left ventricular diastolic parameters are consistent with Grade I diastolic dysfunction (impaired relaxation). Right Ventricle: The right ventricular size is normal. No increase in right ventricular wall thickness. Right ventricular systolic function is normal. Tricuspid regurgitation signal is inadequate for assessing PA pressure. Left Atrium: Left atrial size was normal in size. Right Atrium: Right atrial size was normal in size. Pericardium: There is no evidence of pericardial effusion. Mitral Valve: The mitral valve is normal in structure. No evidence of mitral valve regurgitation. No evidence of mitral valve stenosis. MV peak gradient, 2.7 mmHg. The mean mitral valve  gradient is 1.0 mmHg. Tricuspid Valve: The tricuspid valve  is normal in structure. Tricuspid valve regurgitation is mild . No evidence of tricuspid stenosis. Aortic Valve: The aortic valve has an indeterminant number of cusps. Aortic valve regurgitation is not visualized. Aortic valve sclerosis is present, with no evidence of aortic valve stenosis. Aortic valve mean gradient measures 4.0 mmHg. Aortic valve peak gradient measures 6.2 mmHg. Aortic valve area, by VTI measures 2.54 cm. Pulmonic Valve: The pulmonic valve was normal in structure. Pulmonic valve regurgitation is not visualized. No evidence of pulmonic stenosis. Aorta: The aortic root is normal in size and structure. Venous: The inferior vena cava is normal in size with greater than 50% respiratory variability, suggesting right atrial pressure of 3 mmHg. IAS/Shunts: No atrial level shunt detected by color flow Doppler.  LEFT VENTRICLE PLAX 2D LVIDd:         4.90 cm   Diastology LVIDs:         3.10 cm   LV e' medial:    6.20 cm/s LV PW:         1.20 cm   LV E/e' medial:  9.2 LV IVS:        1.30 cm   LV e' lateral:   8.59 cm/s LVOT diam:     2.00 cm   LV E/e' lateral: 6.6 LV SV:         70 LV SV Index:   33 LVOT Area:     3.14 cm  RIGHT VENTRICLE RV Basal diam:  3.10 cm RV Mid diam:    2.60 cm RV S prime:     11.10 cm/s TAPSE (M-mode): 2.2 cm LEFT ATRIUM             Index        RIGHT ATRIUM           Index LA diam:        3.20 cm 1.49 cm/m   RA Area:     16.90 cm LA Vol (A2C):   34.0 ml 15.79 ml/m  RA Volume:   44.60 ml  20.72 ml/m LA Vol (A4C):   34.7 ml 16.12 ml/m LA Biplane Vol: 34.5 ml 16.03 ml/m  AORTIC VALVE                    PULMONIC VALVE AV Area (Vmax):    2.71 cm     PV Vmax:       0.85 m/s AV Area (Vmean):   2.38 cm     PV Peak grad:  2.9 mmHg AV Area (VTI):     2.54 cm AV Vmax:           124.00 cm/s AV Vmean:          92.600 cm/s AV VTI:            0.277 m AV Peak Grad:      6.2 mmHg AV Mean Grad:      4.0 mmHg LVOT Vmax:          107.00 cm/s LVOT Vmean:        70.100 cm/s LVOT VTI:          0.224 m LVOT/AV VTI ratio: 0.81  AORTA Ao Root diam: 3.50 cm MITRAL VALVE MV Area (PHT): 3.27 cm    SHUNTS MV Area VTI:   3.46 cm    Systemic VTI:  0.22 m MV Peak grad:  2.7 mmHg    Systemic Diam: 2.00 cm MV Mean grad:  1.0 mmHg MV Vmax:  0.82 m/s MV Vmean:      40.5 cm/s MV Decel Time: 232 msec MV E velocity: 57.00 cm/s MV A velocity: 69.10 cm/s MV E/A ratio:  0.82 Ida Rogue MD Electronically signed by Ida Rogue MD Signature Date/Time: 07/30/2022/1:58:42 PM    Final    DG Chest Port 1 View  Result Date: 07/29/2022 CLINICAL DATA:  Questionable sepsis EXAM: PORTABLE CHEST 1 VIEW COMPARISON:  04/11/2020 FINDINGS: Cardiomegaly. Right chest port catheter. Mild diffuse bilateral interstitial pulmonary opacity. Unchanged post treatment appearance of the paramedian right upper lobe. The visualized skeletal structures are unremarkable. IMPRESSION: 1. Cardiomegaly with mild diffuse bilateral interstitial pulmonary opacity, likely edema. 2. Unchanged post treatment appearance of the paramedian right upper lobe. Electronically Signed   By: Delanna Ahmadi M.D.   On: 07/29/2022 18:23        Scheduled Meds:  amLODipine  10 mg Oral Daily   atorvastatin  20 mg Oral QPM   clopidogrel  75 mg Oral Daily   enoxaparin (LOVENOX) injection  40 mg Subcutaneous QHS   oseltamivir  30 mg Oral BID   Continuous Infusions:   LOS: 2 days     Desma Maxim, MD [facility]   If 7PM-7AM, please contact night-coverage [email or web address] Password [email or web address] 07/31/2022, 1:08 PM

## 2022-07-31 NOTE — Progress Notes (Signed)
       CROSS COVER NOTE  NAME: Martin Jennings MRN: [id number] DOB : 25-Feb-1951 ATTENDING PHYSICIAN: Gwynne Edinger, MD    Date of Service   07/31/2022   HPI/Events of Note   Medication request received for sleep aid.  Interventions   Assessment/Plan: Melatonin      To reach the provider On-Call:   7AM- 7PM see care teams to locate the attending and reach out to them via [email or web address]. 7PM-7AM contact night-coverage If you still have difficulty reaching the appropriate provider, please page the [facility] (Director on Call) for [facility] on amion for assistance  This document was prepared using Set designer software and may include unintentional dictation errors.  Neomia Glass DNP, MBA, FNP-BC Nurse Practitioner [facility] Pager [phone]

## 2022-07-31 NOTE — Plan of Care (Signed)
  Problem: Respiratory: Goal: Ability to maintain adequate ventilation will improve Outcome: Progressing   Problem: Activity: Goal: Risk for activity intolerance will decrease Outcome: Progressing   Problem: Nutrition: Goal: Adequate nutrition will be maintained Outcome: Progressing   Problem: Pain Managment: Goal: General experience of comfort will improve Outcome: Progressing

## 2022-07-31 NOTE — Progress Notes (Addendum)
Mobility Specialist - Progress Note    07/31/22 1517  Mobility  Activity Ambulated independently in hallway;Stood at bedside  Level of Assistance Independent  Assistive Device None  Distance Ambulated (ft) 160 ft  Activity Response Tolerated well  Mobility Referral Yes  $Mobility charge 1 Mobility   Post Mobility O2: 96% Pt resting bed on 2L upon entry. Pt STS and ambulates in hallway indep. Pt returned to bed and left with needs in reach.   Loma Sender Mobility Specialist 07/31/22, 3:20 PM

## 2022-07-31 NOTE — Progress Notes (Signed)
Mobility Specialist - Progress Note   Nurse requested Mobility Specialist to perform oxygen saturation test with pt which includes removing pt from oxygen both at rest and while ambulating.  Below are the results from that testing.     Patient Saturations on Room Air at Rest = spO2 81%  Patient Saturations on Room Air while Ambulating = sp02 79% .  Rested and performed pursed lip breathing for 1 minute with sp02 at 86%.  Patient Saturations on 2L Liters of oxygen while Ambulating = sp02 90% Patient returned to 96% on 2L of oxygen while resting. At end of testing pt left in room on 3  Liters of oxygen.  Reported results to nurse.

## 2022-08-01 DIAGNOSIS — J09X1 Influenza due to identified novel influenza A virus with pneumonia: Secondary | ICD-10-CM | POA: Diagnosis not present

## 2022-08-01 LAB — CBC
HCT: 47.8 % (ref 39.0–52.0)
Hemoglobin: 15.8 g/dL (ref 13.0–17.0)
MCH: 31.4 pg (ref 26.0–34.0)
MCHC: 33.1 g/dL (ref 30.0–36.0)
MCV: 95 fL (ref 80.0–100.0)
Platelets: 131 10*3/uL — ABNORMAL LOW (ref 150–400)
RBC: 5.03 MIL/uL (ref 4.22–5.81)
RDW: 13.1 % (ref 11.5–15.5)
WBC: 2.2 10*3/uL — ABNORMAL LOW (ref 4.0–10.5)
nRBC: 0 % (ref 0.0–0.2)

## 2022-08-01 MED ORDER — DOXYLAMINE SUCCINATE (SLEEP) 25 MG PO TABS
25.0000 mg | ORAL_TABLET | Freq: Every evening | ORAL | Status: DC | PRN
  Administered 2022-08-01: 25 mg via ORAL
  Filled 2022-08-01 (×2): qty 1

## 2022-08-01 MED ORDER — DIPHENHYDRAMINE HCL 25 MG PO CAPS: 25.0000 mg | ORAL_CAPSULE | Freq: Once | ORAL | Status: DC

## 2022-08-01 MED ORDER — FLUTICASONE PROPIONATE 50 MCG/ACT NA SUSP
1.0000 | Freq: Every day | NASAL | Status: DC
  Administered 2022-08-01 – 2022-08-02 (×2): 1 via NASAL
  Filled 2022-08-01: qty 16

## 2022-08-01 NOTE — Progress Notes (Signed)
PROGRESS NOTE    Martin Jennings  SWN:[id number] DOB: 25-Oct-1950 DOA: 07/29/2022 PCP: Perrin Maltese, MD  Outpatient Specialists: oncology    Brief Narrative:   From admission h and p  Martin Jennings is a 72 y.o. male with medical history significant for bladder cancer s/p neoadjuvant chemotherapy and radical cystoprostatectomy with ileal conduit August 2012 , hypertension, peripheral vascular disease, metastatic non-small cell adenocarcinoma of the lung s/p chemoradiation, currently in remission, last seen by oncology 05/11/2022, who was brought in by EMS with a several day history of a worsening cough.  Patient has had a cough for a month.  With EMS he had a temperature of 100.4 and required 3 L to maintain sats in the mid 90s.  He denies chest pain, vomiting, abdominal pain or diarrhea.   Assessment & Plan:   Principal Problem:   Influenza A with pneumonia Active Problems:   Acute respiratory failure with hypoxia ([place])   Sepsis ([place])   AKI (acute kidney injury) ([place])   Tobacco use disorder   Essential hypertension   PVD (peripheral vascular disease) ([place])   Malignant neoplasm of lung ([place])   History of bladder cancer s/p neoadjuvant chemotherapy and cystoprostatectomy   History of ileal conduit 2012   Stage 3b chronic kidney disease (CKD) ([facility])  # Acute hypoxic respiratory failure Desats to low 80s off oxygen, currently requiring 2 L, hasn't been able to wean off yet - [place] O2, wean as able  # Influenza A # Sepsis With hypoxia as above. Sepsis by tachycardia, elevated lactate. Lactate has normalized. Underlying lung cancer, smoking, complicate course. Symptomatically improving. Dimer elevated, given risks and persistent o2 requirement will proceed w/ vq scan - cont tamiflu - f/u vq scan  # Debility - PT advising HH PT, ordered  # HTN Bp now appropriate - cont home amlodipine - labetalol prn  # CKD 3b Cr 1.68 appears to be at baseline - monitor  #  Thrombocytopenia Likely reactive 2/2 infection, improving - monitor for now  # Elevated BNP Mildly elevated at 120s, does have pulm edema on cxr, no peripheral edema. No heart failure diagnosis. TTE with g1 dd, no other abnormalities  # Hx bladder cancer, s/p cystoprostatectomy, s/p ileal conduit - ostomy nurse consult  # Lung cancer S/p chemoradiation, now in remission  - outpt onc f/u  # PAD - cont home plavix, statin   DVT prophylaxis: lovenox Code Status: full Family Communication: wife updated telephonically 1/6, no answer today. Son updated telephnically today  Level of care: med/surg Status is: Inpatient    Consultants:  none  Procedures: none  Antimicrobials:  none    Subjective: Says same as yesterday, energy improved, breathing improving. Ambulated with mobility without problem.  Objective: Vitals:   07/31/22 1936 08/01/22 0452 08/01/22 0500 08/01/22 0823  BP: 118/71 128/66  136/71  Pulse: 72 75  76  Resp: 20 16  17   Temp: 98.6 F (37 C) 98.1 F (36.7 C)  97.9 F (36.6 C)  TempSrc: Oral Oral    SpO2: 95% 94%  (!) 88%  Weight:   84.3 kg   Height:        Intake/Output Summary (Last 24 hours) at 08/01/2022 1513 Last data filed at 08/01/2022 1100 Gross per 24 hour  Intake 100 ml  Output 1375 ml  Net -1275 ml   Filed Weights   07/29/22 1755 07/31/22 0348 08/01/22 0500  Weight: 95.3 kg 84.3 kg 84.3 kg    Examination:  General exam: Appears calm and comfortable  Respiratory system: few scattered rhonchi Cardiovascular system: S1 & S2 heard, tachycardic. No JVD, murmurs, rubs, gallops or clicks.  Gastrointestinal system: Abdomen is nondistended, soft and nontender. No organomegaly or masses felt. Normal bowel sounds heard. Central nervous system: Alert and oriented. No focal neurological deficits. Extremities: Symmetric 5 x 5 power. No edema Skin: No rashes, lesions or ulcers Psychiatry: Judgement and insight appear normal. Mood & affect  appropriate.     Data Reviewed: I have personally reviewed following labs and imaging studies  CBC: Recent Labs  Lab 07/29/22 1810 07/30/22 0436 07/31/22 0440 08/01/22 0432  WBC 4.2 4.0 3.0* 2.2*  NEUTROABS 3.2  --   --   --   HGB 16.6 16.2 16.2 15.8  HCT 50.3 49.5 49.6 47.8  MCV 96.7 97.6 96.3 95.0  PLT 120* 112* 118* 161*   Basic Metabolic Panel: Recent Labs  Lab 07/29/22 1810 07/30/22 0436 07/31/22 0440  NA 140 138 137  K 3.8 3.3* 3.7  CL 105 102 101  CO2 24 26 26   GLUCOSE 117* 115* 92  BUN 18 21 27*  CREATININE 1.78* 1.89* 1.78*  CALCIUM 8.7* 7.9* 8.3*   GFR: Estimated Creatinine Clearance: 40.5 mL/min (A) (by C-G formula based on SCr of 1.78 mg/dL (H)). Liver Function Tests: Recent Labs  Lab 07/29/22 1810  AST 23  ALT 19  ALKPHOS 57  BILITOT 0.9  PROT 7.5  ALBUMIN 3.7   No results for input(s): "LIPASE", "AMYLASE" in the last 168 hours. No results for input(s): "AMMONIA" in the last 168 hours. Coagulation Profile: Recent Labs  Lab 07/29/22 1810  INR 1.2   Cardiac Enzymes: No results for input(s): "CKTOTAL", "CKMB", "CKMBINDEX", "TROPONINI" in the last 168 hours. BNP (last 3 results) No results for input(s): "PROBNP" in the last 8760 hours. HbA1C: No results for input(s): "HGBA1C" in the last 72 hours. CBG: No results for input(s): "GLUCAP" in the last 168 hours. Lipid Profile: No results for input(s): "CHOL", "HDL", "LDLCALC", "TRIG", "CHOLHDL", "LDLDIRECT" in the last 72 hours. Thyroid Function Tests: No results for input(s): "TSH", "T4TOTAL", "FREET4", "T3FREE", "THYROIDAB" in the last 72 hours. Anemia Panel: No results for input(s): "VITAMINB12", "FOLATE", "FERRITIN", "TIBC", "IRON", "RETICCTPCT" in the last 72 hours. Urine analysis:    Component Value Date/Time   COLORURINE YELLOW (A) 07/29/2022 1830   APPEARANCEUR CLOUDY (A) 07/29/2022 1830   LABSPEC 1.013 07/29/2022 1830   PHURINE 7.0 07/29/2022 1830   GLUCOSEU NEGATIVE  07/29/2022 1830   HGBUR NEGATIVE 07/29/2022 1830   [place] NEGATIVE 07/29/2022 1830   [place] 07/29/2022 1830   PROTEINUR 30 (A) 07/29/2022 1830   NITRITE NEGATIVE 07/29/2022 1830   LEUKOCYTESUR NEGATIVE 07/29/2022 1830   Sepsis Labs: @LABRCNTIP (procalcitonin:4,lacticidven:4)  ) Recent Results (from the past 240 hour(s))  Resp panel by RT-PCR (RSV, Flu A&B, Covid) Anterior Nasal Swab     Status: Abnormal   Collection Time: 07/29/22  6:08 PM   Specimen: Anterior Nasal Swab  Result Value Ref Range Status   SARS Coronavirus 2 by RT PCR NEGATIVE NEGATIVE Final    Comment: (NOTE) SARS-CoV-2 target nucleic acids are NOT DETECTED.  The SARS-CoV-2 RNA is generally detectable in upper respiratory specimens during the acute phase of infection. The lowest concentration of SARS-CoV-2 viral copies this assay can detect is 138 copies/mL. A negative result does not preclude SARS-Cov-2 infection and should not be used as the sole basis for treatment or other patient management decisions. A negative result  may occur with  improper specimen collection/handling, submission of specimen other than nasopharyngeal swab, presence of viral mutation(s) within the areas targeted by this assay, and inadequate number of viral copies(<138 copies/mL). A negative result must be combined with clinical observations, patient history, and epidemiological information. The expected result is Negative.  Fact Sheet for Patients:  EntrepreneurPulse.com.au  Fact Sheet for Healthcare Providers:  IncredibleEmployment.be  This test is no t yet approved or cleared by the [place] FDA and  has been authorized for detection and/or diagnosis of SARS-CoV-2 by FDA under an Emergency Use Authorization (EUA). This EUA will remain  in effect (meaning this test can be used) for the duration of the COVID-19 declaration under Section 564(b)(1) of the Act, 21 U.S.C.section  360bbb-3(b)(1), unless the authorization is terminated  or revoked sooner.       Influenza A by PCR POSITIVE (A) NEGATIVE Final   Influenza B by PCR NEGATIVE NEGATIVE Final    Comment: (NOTE) The Xpert Xpress SARS-CoV-2/FLU/RSV plus assay is intended as an aid in the diagnosis of influenza from Nasopharyngeal swab specimens and should not be used as a sole basis for treatment. Nasal washings and aspirates are unacceptable for Xpert Xpress SARS-CoV-2/FLU/RSV testing.  Fact Sheet for Patients: EntrepreneurPulse.com.au  Fact Sheet for Healthcare Providers: IncredibleEmployment.be  This test is not yet approved or cleared by the [place] FDA and has been authorized for detection and/or diagnosis of SARS-CoV-2 by FDA under an Emergency Use Authorization (EUA). This EUA will remain in effect (meaning this test can be used) for the duration of the COVID-19 declaration under Section 564(b)(1) of the Act, 21 U.S.C. section 360bbb-3(b)(1), unless the authorization is terminated or revoked.     Resp Syncytial Virus by PCR NEGATIVE NEGATIVE Final    Comment: (NOTE) Fact Sheet for Patients: EntrepreneurPulse.com.au  Fact Sheet for Healthcare Providers: IncredibleEmployment.be  This test is not yet approved or cleared by the [place] FDA and has been authorized for detection and/or diagnosis of SARS-CoV-2 by FDA under an Emergency Use Authorization (EUA). This EUA will remain in effect (meaning this test can be used) for the duration of the COVID-19 declaration under Section 564(b)(1) of the Act, 21 U.S.C. section 360bbb-3(b)(1), unless the authorization is terminated or revoked.  Performed at [facility], [address] [place], [place] [address]   Blood Culture (routine x 2)     Status: None (Preliminary result)   Collection Time: 07/29/22  6:10 PM   Specimen: BLOOD  Result Value Ref  Range Status   Specimen Description BLOOD BLOOD RIGHT ARM  Final   Special Requests   Final    BOTTLES DRAWN AEROBIC AND ANAEROBIC Blood Culture adequate volume   Culture   Final    NO GROWTH 3 DAYS Performed at [facility], [address] [place], [place] [address]    Report Status PENDING  Incomplete  Blood Culture (routine x 2)     Status: None (Preliminary result)   Collection Time: 07/29/22  6:11 PM   Specimen: BLOOD  Result Value Ref Range Status   Specimen Description BLOOD BLOOD LEFT ARM  Final   Special Requests BOTTLES DRAWN AEROBIC AND ANAEROBIC BCAV  Final   Culture   Final    NO GROWTH 3 DAYS Performed at [facility], [address] [place], [place] [address]    Report Status PENDING  Incomplete  Urine Culture     Status: Abnormal   Collection Time: 07/29/22  6:30 PM  Specimen: Urine, Random  Result Value Ref Range Status   Specimen Description   Final    URINE, RANDOM Performed at [facility], [address], [place], [place] [address]    Special Requests   Final    NONE Performed at [facility], [address] [place], [place] [address]    Culture MULTIPLE SPECIES PRESENT, SUGGEST RECOLLECTION (A)  Final   Report Status 07/30/2022 FINAL  Final         Radiology Studies: No results found.      Scheduled Meds:  amLODipine  10 mg Oral Daily   atorvastatin  20 mg Oral QPM   clopidogrel  75 mg Oral Daily   enoxaparin (LOVENOX) injection  40 mg Subcutaneous QHS   melatonin  5 mg Oral QHS   oseltamivir  30 mg Oral BID   Continuous Infusions:   LOS: 3 days     Desma Maxim, MD [facility]   If 7PM-7AM, please contact night-coverage [email or web address] Password [email or web address] 08/01/2022, 3:13 PM

## 2022-08-01 NOTE — Progress Notes (Signed)
Mobility Specialist - Progress Note    08/01/22 1804  Mobility  Activity Ambulated independently in hallway  Level of Assistance Independent  Assistive Device None  Distance Ambulated (ft) 320 ft  Activity Response Tolerated well  Mobility Referral Yes  $Mobility charge 1 Mobility     [employer] Mobility Specialist 08/01/22, 6:04 PM

## 2022-08-01 NOTE — Progress Notes (Signed)
Mobility Specialist - Progress Note   Pre-mobility: SpO2: (88) During mobility: SpO2 (83) Post-mobility:SPO2(86)     08/01/22 1601  Mobility  Activity Ambulated independently in hallway  Level of Assistance Independent  Assistive Device None  Distance Ambulated (ft) 320 ft  Activity Response Tolerated well  Mobility Referral Yes  $Mobility charge 1 Mobility   Pt resting in bed on 1L upon entry. Pt STS and ambulates to hallway around NS Indep. Pt displayed no symptoms of SOB or lightheadedness during walk despite low O2 stats. (RN Notified) Pt returned to bed and left with needs in reach.   Loma Sender Mobility Specialist 08/01/22, 4:05 PM

## 2022-08-01 NOTE — Progress Notes (Signed)
       CROSS COVER NOTE  NAME: Martin Jennings MRN: [id number] DOB : [birth year] ATTENDING PHYSICIAN: Martin Edinger, MD    Date of Service   08/01/2022   HPI/Events of Note   Medication request received for sleep aid. Martin Jennings reports Melatonin was ineffective last night.  Interventions   Assessment/Plan: [employer] [facility] Please discuss with patient on rounds tomorrow      To reach the provider On-Call:   7AM- 7PM see care teams to locate the attending and reach out to them via [email or web address]. 7PM-7AM contact night-coverage If you still have difficulty reaching the appropriate provider, please page the [facility] (Director on Call) for [facility] on amion for assistance  This document was prepared using Set designer software and may include unintentional dictation errors.  Martin Glass DNP, MBA, FNP-BC Nurse Practitioner [facility] Pager [phone]

## 2022-08-02 ENCOUNTER — Inpatient Hospital Stay: Payer: Medicare Other

## 2022-08-02 DIAGNOSIS — J09X1 Influenza due to identified novel influenza A virus with pneumonia: Secondary | ICD-10-CM | POA: Diagnosis not present

## 2022-08-02 LAB — CBC
HCT: 48 % (ref 39.0–52.0)
Hemoglobin: 15.7 g/dL (ref 13.0–17.0)
MCH: 31.5 pg (ref 26.0–34.0)
MCHC: 32.7 g/dL (ref 30.0–36.0)
MCV: 96.2 fL (ref 80.0–100.0)
Platelets: 130 10*3/uL — ABNORMAL LOW (ref 150–400)
RBC: 4.99 MIL/uL (ref 4.22–5.81)
RDW: 13 % (ref 11.5–15.5)
WBC: 2.3 10*3/uL — ABNORMAL LOW (ref 4.0–10.5)
nRBC: 0 % (ref 0.0–0.2)

## 2022-08-02 MED ORDER — TECHNETIUM TO 99M ALBUMIN AGGREGATED
4.3700 | Freq: Once | INTRAVENOUS | Status: AC | PRN
  Administered 2022-08-02: 4.37 via INTRAVENOUS

## 2022-08-02 NOTE — Care Management Important Message (Signed)
Important Message  Patient Details  Name: Martin Jennings MRN: [id number] Date of Birth: [birth year]   Medicare Important Message Given:  Yes  Reviewed Medicare IM with patient via room phone [phone]).  Copy of Medicare IM mailed to home address on file.     Dannette Barbara 08/02/2022, 2:02 PM

## 2022-08-02 NOTE — Progress Notes (Signed)
SATURATION QUALIFICATIONS: (This note is used to comply with regulatory documentation for home oxygen)  Patient Saturations on Room Air at Rest = 91%  Patient Saturations on Room Air while Ambulating = 77%  Patient Saturations on 2 Liters of oxygen while Ambulating = 93%  Please briefly explain why patient needs home oxygen:

## 2022-08-02 NOTE — Progress Notes (Signed)
PT Cancellation Note  Patient Details Name: Martin Jennings MRN: [id number] DOB: 08/05/1950   Cancelled Treatment:    Reason Eval/Treat Not Completed: Patient at procedure or test/unavailable. Chart reviewed prior to entry. Transport taking pt off of unite for procedure. Stating pt will be available later this a.m. PT to return as able.    Salem Caster. Fairly IV, PT, DPT Physical Therapist- [facility]  08/02/2022, 9:51 AM

## 2022-08-02 NOTE — Discharge Summary (Signed)
Martin Jennings EXH:[id number] DOB: [birth year] DOA: 07/29/2022  PCP: Perrin Maltese, MD  Admit date: 07/29/2022 Discharge date: 08/02/2022  Time spent: 35 minutes  Recommendations for Outpatient Follow-up:  Pcp f/u, assess oxygen status at that time, also CBC    Discharge Diagnoses:  Principal Problem:   Influenza A with pneumonia Active Problems:   Acute respiratory failure with hypoxia ([place])   Sepsis ([place])   AKI (acute kidney injury) ([place])   Tobacco use disorder   Essential hypertension   PVD (peripheral vascular disease) ([place])   Malignant neoplasm of lung ([place])   History of bladder cancer s/p neoadjuvant chemotherapy and cystoprostatectomy   History of ileal conduit 2012   Stage 3b chronic kidney disease (CKD) ([place])   Discharge Condition: improved  Diet recommendation: heart healthy  Filed Weights   07/31/22 0348 08/01/22 0500 08/02/22 0450  Weight: 84.3 kg 84.3 kg 83.6 kg    History of present illness:  From admission h and p Martin Jennings is a 72 y.o. male with medical history significant for bladder cancer s/p neoadjuvant chemotherapy and radical cystoprostatectomy with ileal conduit August 2012 , hypertension, peripheral vascular disease, metastatic non-small cell adenocarcinoma of the lung s/p chemoradiation, currently in remission, last seen by oncology 05/11/2022, who was brought in by EMS with a several day history of a worsening cough.  Patient has had a cough for a month.  With EMS he had a temperature of 100.4 and required 3 L to maintain sats in the mid 90s.  He denies chest pain, vomiting, abdominal pain or diarrhea.   Hospital Course:  Patient presented with acute hypoxic respiratory failure with dyspnea secondary to influenza a. He was treated with a course of tamiflu which he completed inpatient. He required o2 that was weaned off at rest but given significant desaturation to 77% with ambulation (recovered to 93% with 2 L), will d/c home with home o2 which will  need to be reassessed at PCP f/u. Given persistent o2 requirement vq scan was pursued which showed no PE. PT evaluated, advising HH PT, which was ordered. Ckd 3b at baseline. BNP mildly elevated, TTE obtained which was normal save for grade one diastolic dysfunction - clinically no signs of fluid overload. Patient with mild, slowly improving thrombocytopenia, likely 2/2 infection, advise cbc at pcp f/u.   Procedures: none   Consultations: none  Discharge Exam: Vitals:   08/02/22 0450 08/02/22 0822  BP: 136/74 (!) 145/112  Pulse: 75 79  Resp: 20 17  Temp: 98.5 F (36.9 C) 98.2 F (36.8 C)  SpO2: 95% 94%    General: NAD Cardiovascular: RRR Respiratory: few scattered rhonchi  Discharge Instructions   Discharge Instructions     Diet - low sodium heart healthy   Complete by: As directed    Increase activity slowly   Complete by: As directed       Allergies as of 08/02/2022   No Known Allergies      Medication List     STOP taking these medications    lidocaine-prilocaine cream Commonly known as: EMLA   oxyCODONE 5 MG immediate release tablet Commonly known as: Oxy IR/ROXICODONE   traZODone 50 MG tablet Commonly known as: DESYREL   Trelegy Ellipta 100-62.5-25 MCG/ACT Aepb Generic drug: Fluticasone-Umeclidin-Vilant   zaleplon 5 MG capsule Commonly known as: SONATA       TAKE these medications    amLODipine 10 MG tablet Commonly known as: NORVASC Take 10 mg by mouth daily.  atorvastatin 20 MG tablet Commonly known as: LIPITOR Take 20 mg by mouth every evening.   cholecalciferol 25 MCG (1000 UNIT) tablet Commonly known as: VITAMIN D3 Take 1,000 Units by mouth daily.   clopidogrel 75 MG tablet Commonly known as: PLAVIX Take 75 mg by mouth daily.   colchicine 0.6 MG tablet Take 0.6 mg by mouth daily.   fexofenadine 180 MG tablet Commonly known as: ALLEGRA Take 180 mg by mouth daily as needed.               Durable Medical Equipment   (From admission, onward)           Start     Ordered   08/02/22 1204  DME Oxygen  Once       Question Answer Comment  Length of Need 6 Months   Mode or (Route) Nasal cannula   Liters per Minute 2   Oxygen delivery system Gas      08/02/22 1203           No Known Allergies  Follow-up Information     Perrin Maltese, MD. Go to.   Specialty: Internal Medicine Why: 08/10/2021 1:15 PM Contact information: [place] [address] [phone]                  The results of significant diagnostics from this hospitalization (including imaging, microbiology, ancillary and laboratory) are listed below for reference.    Significant Diagnostic Studies: NM Pulmonary Perfusion  Result Date: 08/02/2022 CLINICAL DATA:  Hypoxia.  Concern for pulmonary embolism. EXAM: NUCLEAR MEDICINE PERFUSION LUNG SCAN TECHNIQUE: Perfusion images were obtained in multiple projections after intravenous injection of radiopharmaceutical. RADIOPHARMACEUTICALS:  4.5 mCi Tc-20m MAA COMPARISON:  Chest radiograph 08/02/2022, CT 10 04/2022 FINDINGS: Wedge-shaped peripheral perfusion defect in the RIGHT upper lobe corresponds to RIGHT upper lobe scarring seen on comparison radiograph and CT. No wedge-shaped peripheral perfusion defects to localize acute pulmonary embolism. IMPRESSION: No evidence acute pulmonary embolism. Electronically Signed   By: Suzy Bouchard M.D.   On: 08/02/2022 11:20   DG Chest Port 1 View  Result Date: 08/02/2022 CLINICAL DATA:  Cough and fever. History of right lung cancer diagnosed September 2021. EXAM: PORTABLE CHEST 1 VIEW COMPARISON:  07/29/2022, CT 05/04/2022 FINDINGS: Right IJ central venous catheter unchanged. Lungs are adequately inflated demonstrate somewhat linear airspace density over the right upper lobe/apex slightly worse compared to the prior chest radiograph. Volume loss of the right upper lobe with elevation of the minor fissure unchanged. No  effusion. Cardiomediastinal silhouette and remainder of the exam is unchanged. IMPRESSION: Slight worsening linear airspace density over the right upper lobe/apex which may be due to atelectasis or early infection versus post treatment changes and less likely local recurrence of patient's known history of lung cancer. Consider re-evaluation with chest CT. Electronically Signed   By: Marin Olp M.D.   On: 08/02/2022 09:15   ECHOCARDIOGRAM COMPLETE  Result Date: 07/30/2022    ECHOCARDIOGRAM REPORT   Patient Name:   KARTEL WOLBERT Date of Exam: 07/30/2022 Medical Rec #:  [id number]      Height:       71.0 in Accession #:    [id number]     Weight:       210.0 lb Date of Birth:  04-06-1951      BSA:          2.153 m Patient Age:    35 years       BP:  179/99 mmHg Patient Gender: M              HR:           73 bpm. Exam Location:  [facility] Procedure: 2D Echo, Cardiac Doppler and Color Doppler Indications:     CHF  History:         Patient has no prior history of Echocardiogram examinations.                  Risk Factors:Hypertension, Dyslipidemia and Current Smoker. Flu                  +, Bladder CA, Lung CA.  Sonographer:     Wenda Low Referring Phys:  [id number] Athena Masse Diagnosing Phys: Ida Rogue MD  Sonographer Comments: Image acquisition challenging due to respiratory motion. IMPRESSIONS  1. Left ventricular ejection fraction, by estimation, is 55 to 60%. The left ventricle has normal function. The left ventricle has no regional wall motion abnormalities. There is moderate left ventricular hypertrophy. Left ventricular diastolic parameters are consistent with Grade I diastolic dysfunction (impaired relaxation).  2. Right ventricular systolic function is normal. The right ventricular size is normal. Tricuspid regurgitation signal is inadequate for assessing PA pressure.  3. The mitral valve is normal in structure. No evidence of mitral valve regurgitation. No evidence of mitral stenosis.  4.  The aortic valve has an indeterminant number of cusps. Aortic valve regurgitation is not visualized. Aortic valve sclerosis is present, with no evidence of aortic valve stenosis.  5. The inferior vena cava is normal in size with greater than 50% respiratory variability, suggesting right atrial pressure of 3 mmHg. FINDINGS  Left Ventricle: Left ventricular ejection fraction, by estimation, is 55 to 60%. The left ventricle has normal function. The left ventricle has no regional wall motion abnormalities. The left ventricular internal cavity size was normal in size. There is  moderate left ventricular hypertrophy. Left ventricular diastolic parameters are consistent with Grade I diastolic dysfunction (impaired relaxation). Right Ventricle: The right ventricular size is normal. No increase in right ventricular wall thickness. Right ventricular systolic function is normal. Tricuspid regurgitation signal is inadequate for assessing PA pressure. Left Atrium: Left atrial size was normal in size. Right Atrium: Right atrial size was normal in size. Pericardium: There is no evidence of pericardial effusion. Mitral Valve: The mitral valve is normal in structure. No evidence of mitral valve regurgitation. No evidence of mitral valve stenosis. MV peak gradient, 2.7 mmHg. The mean mitral valve gradient is 1.0 mmHg. Tricuspid Valve: The tricuspid valve is normal in structure. Tricuspid valve regurgitation is mild . No evidence of tricuspid stenosis. Aortic Valve: The aortic valve has an indeterminant number of cusps. Aortic valve regurgitation is not visualized. Aortic valve sclerosis is present, with no evidence of aortic valve stenosis. Aortic valve mean gradient measures 4.0 mmHg. Aortic valve peak gradient measures 6.2 mmHg. Aortic valve area, by VTI measures 2.54 cm. Pulmonic Valve: The pulmonic valve was normal in structure. Pulmonic valve regurgitation is not visualized. No evidence of pulmonic stenosis. Aorta: The aortic  root is normal in size and structure. Venous: The inferior vena cava is normal in size with greater than 50% respiratory variability, suggesting right atrial pressure of 3 mmHg. IAS/Shunts: No atrial level shunt detected by color flow Doppler.  LEFT VENTRICLE PLAX 2D LVIDd:         4.90 cm   Diastology LVIDs:         3.10 cm  LV e' medial:    6.20 cm/s LV PW:         1.20 cm   LV E/e' medial:  9.2 LV IVS:        1.30 cm   LV e' lateral:   8.59 cm/s LVOT diam:     2.00 cm   LV E/e' lateral: 6.6 LV SV:         70 LV SV Index:   33 LVOT Area:     3.14 cm  RIGHT VENTRICLE RV Basal diam:  3.10 cm RV Mid diam:    2.60 cm RV S prime:     11.10 cm/s TAPSE (M-mode): 2.2 cm LEFT ATRIUM             Index        RIGHT ATRIUM           Index LA diam:        3.20 cm 1.49 cm/m   RA Area:     16.90 cm LA Vol (A2C):   34.0 ml 15.79 ml/m  RA Volume:   44.60 ml  20.72 ml/m LA Vol (A4C):   34.7 ml 16.12 ml/m LA Biplane Vol: 34.5 ml 16.03 ml/m  AORTIC VALVE                    PULMONIC VALVE AV Area (Vmax):    2.71 cm     PV Vmax:       0.85 m/s AV Area (Vmean):   2.38 cm     PV Peak grad:  2.9 mmHg AV Area (VTI):     2.54 cm AV Vmax:           124.00 cm/s AV Vmean:          92.600 cm/s AV VTI:            0.277 m AV Peak Grad:      6.2 mmHg AV Mean Grad:      4.0 mmHg LVOT Vmax:         107.00 cm/s LVOT Vmean:        70.100 cm/s LVOT VTI:          0.224 m LVOT/AV VTI ratio: 0.81  AORTA Ao Root diam: 3.50 cm MITRAL VALVE MV Area (PHT): 3.27 cm    SHUNTS MV Area VTI:   3.46 cm    Systemic VTI:  0.22 m MV Peak grad:  2.7 mmHg    Systemic Diam: 2.00 cm MV Mean grad:  1.0 mmHg MV Vmax:       0.82 m/s MV Vmean:      40.5 cm/s MV Decel Time: 232 msec MV E velocity: 57.00 cm/s MV A velocity: 69.10 cm/s MV E/A ratio:  0.82 Ida Rogue MD Electronically signed by Ida Rogue MD Signature Date/Time: 07/30/2022/1:58:42 PM    Final    DG Chest Port 1 View  Result Date: 07/29/2022 CLINICAL DATA:  Questionable sepsis EXAM: PORTABLE  CHEST 1 VIEW COMPARISON:  04/11/2020 FINDINGS: Cardiomegaly. Right chest port catheter. Mild diffuse bilateral interstitial pulmonary opacity. Unchanged post treatment appearance of the paramedian right upper lobe. The visualized skeletal structures are unremarkable. IMPRESSION: 1. Cardiomegaly with mild diffuse bilateral interstitial pulmonary opacity, likely edema. 2. Unchanged post treatment appearance of the paramedian right upper lobe. Electronically Signed   By: Delanna Ahmadi M.D.   On: 07/29/2022 18:23    Microbiology: Recent Results (from the past 240 hour(s))  Resp panel by RT-PCR (RSV, Flu  A&B, Covid) Anterior Nasal Swab     Status: Abnormal   Collection Time: 07/29/22  6:08 PM   Specimen: Anterior Nasal Swab  Result Value Ref Range Status   SARS Coronavirus 2 by RT PCR NEGATIVE NEGATIVE Final    Comment: (NOTE) SARS-CoV-2 target nucleic acids are NOT DETECTED.  The SARS-CoV-2 RNA is generally detectable in upper respiratory specimens during the acute phase of infection. The lowest concentration of SARS-CoV-2 viral copies this assay can detect is 138 copies/mL. A negative result does not preclude SARS-Cov-2 infection and should not be used as the sole basis for treatment or other patient management decisions. A negative result may occur with  improper specimen collection/handling, submission of specimen other than nasopharyngeal swab, presence of viral mutation(s) within the areas targeted by this assay, and inadequate number of viral copies(<138 copies/mL). A negative result must be combined with clinical observations, patient history, and epidemiological information. The expected result is Negative.  Fact Sheet for Patients:  EntrepreneurPulse.com.au  Fact Sheet for Healthcare Providers:  IncredibleEmployment.be  This test is no t yet approved or cleared by the [place] FDA and  has been authorized for detection and/or diagnosis of  SARS-CoV-2 by FDA under an Emergency Use Authorization (EUA). This EUA will remain  in effect (meaning this test can be used) for the duration of the COVID-19 declaration under Section 564(b)(1) of the Act, 21 U.S.C.section 360bbb-3(b)(1), unless the authorization is terminated  or revoked sooner.       Influenza A by PCR POSITIVE (A) NEGATIVE Final   Influenza B by PCR NEGATIVE NEGATIVE Final    Comment: (NOTE) The Xpert Xpress SARS-CoV-2/FLU/RSV plus assay is intended as an aid in the diagnosis of influenza from Nasopharyngeal swab specimens and should not be used as a sole basis for treatment. Nasal washings and aspirates are unacceptable for Xpert Xpress SARS-CoV-2/FLU/RSV testing.  Fact Sheet for Patients: EntrepreneurPulse.com.au  Fact Sheet for Healthcare Providers: IncredibleEmployment.be  This test is not yet approved or cleared by the [place] FDA and has been authorized for detection and/or diagnosis of SARS-CoV-2 by FDA under an Emergency Use Authorization (EUA). This EUA will remain in effect (meaning this test can be used) for the duration of the COVID-19 declaration under Section 564(b)(1) of the Act, 21 U.S.C. section 360bbb-3(b)(1), unless the authorization is terminated or revoked.     Resp Syncytial Virus by PCR NEGATIVE NEGATIVE Final    Comment: (NOTE) Fact Sheet for Patients: EntrepreneurPulse.com.au  Fact Sheet for Healthcare Providers: IncredibleEmployment.be  This test is not yet approved or cleared by the [place] FDA and has been authorized for detection and/or diagnosis of SARS-CoV-2 by FDA under an Emergency Use Authorization (EUA). This EUA will remain in effect (meaning this test can be used) for the duration of the COVID-19 declaration under Section 564(b)(1) of the Act, 21 U.S.C. section 360bbb-3(b)(1), unless the authorization is terminated  or revoked.  Performed at [facility], [address] [place], [place] [address]   Blood Culture (routine x 2)     Status: None (Preliminary result)   Collection Time: 07/29/22  6:10 PM   Specimen: BLOOD  Result Value Ref Range Status   Specimen Description BLOOD BLOOD RIGHT ARM  Final   Special Requests   Final    BOTTLES DRAWN AEROBIC AND ANAEROBIC Blood Culture adequate volume   Culture   Final    NO GROWTH 4 DAYS Performed at [facility], [address], [place], [place] [address]  Report Status PENDING  Incomplete  Blood Culture (routine x 2)     Status: None (Preliminary result)   Collection Time: 07/29/22  6:11 PM   Specimen: BLOOD  Result Value Ref Range Status   Specimen Description BLOOD BLOOD LEFT ARM  Final   Special Requests BOTTLES DRAWN AEROBIC AND ANAEROBIC BCAV  Final   Culture   Final    NO GROWTH 4 DAYS Performed at [facility], [address] [place], [place] [address]    Report Status PENDING  Incomplete  Urine Culture     Status: Abnormal   Collection Time: 07/29/22  6:30 PM   Specimen: Urine, Random  Result Value Ref Range Status   Specimen Description   Final    URINE, RANDOM Performed at [facility], [address] [place], [place] [address]    Special Requests   Final    NONE Performed at [facility], [address] [place], [place] [address]    Culture MULTIPLE SPECIES PRESENT, SUGGEST RECOLLECTION (A)  Final   Report Status 07/30/2022 FINAL  Final     Labs: Basic Metabolic Panel: Recent Labs  Lab 07/29/22 1810 07/30/22 0436 07/31/22 0440  NA 140 138 137  K 3.8 3.3* 3.7  CL 105 102 101  CO2 24 26 26   GLUCOSE 117* 115* 92  BUN 18 21 27*  CREATININE 1.78* 1.89* 1.78*  CALCIUM 8.7* 7.9* 8.3*   Liver Function Tests: Recent Labs  Lab 07/29/22 1810  AST 23  ALT 19  ALKPHOS 57  BILITOT 0.9  PROT 7.5  ALBUMIN 3.7   No results for input(s): "LIPASE", "AMYLASE" in the  last 168 hours. No results for input(s): "AMMONIA" in the last 168 hours. CBC: Recent Labs  Lab 07/29/22 1810 07/30/22 0436 07/31/22 0440 08/01/22 0432 08/02/22 0351  WBC 4.2 4.0 3.0* 2.2* 2.3*  NEUTROABS 3.2  --   --   --   --   HGB 16.6 16.2 16.2 15.8 15.7  HCT 50.3 49.5 49.6 47.8 48.0  MCV 96.7 97.6 96.3 95.0 96.2  PLT 120* 112* 118* 131* 130*   Cardiac Enzymes: No results for input(s): "CKTOTAL", "CKMB", "CKMBINDEX", "TROPONINI" in the last 168 hours. BNP: BNP (last 3 results) Recent Labs    07/29/22 2035  BNP 121.6*    ProBNP (last 3 results) No results for input(s): "PROBNP" in the last 8760 hours.  CBG: No results for input(s): "GLUCAP" in the last 168 hours.     Signed:  Desma Maxim MD.  [facility] 08/02/2022, 12:58 PM

## 2022-08-02 NOTE — Plan of Care (Signed)

## 2022-08-02 NOTE — TOC Transition Note (Signed)
Transition of Care [facility]) - CM/SW Discharge Note   Patient Details  Name: Martin Jennings MRN: [id number] Date of Birth: Jun 04, 1951  [facility] of [facility]) CM/SW Contact:  Candie Chroman, LCSW Phone Number: 08/02/2022, 1:32 PM   Clinical Narrative:   Patient has orders to discharge home today. [place] liaison is aware. Ordered oxygen through [employer] to be delivered to the room before he leaves. No further concerns. CSW signing off.  Final next level of care: Home w Home Health Services Barriers to Discharge: Barriers Resolved   Patient Goals and CMS Choice CMS Medicare.gov Compare Post Acute Care list provided to:: Patient Choice offered to / list presented to : Patient  Discharge Placement                  Patient to be transferred to facility by: Wife   Patient and family notified of of transfer: 08/02/22  Discharge Plan and Services Additional resources added to the After Visit Summary for     Discharge Planning Services: CM Consult Post Acute Care Choice: Home Health          DME Arranged: Oxygen DME Agency: [employer] Date DME Agency Contacted: 08/02/22   Representative spoke with at DME Agency: [employer] HH Arranged: PT, RN [place] Agency: [place] Date [place]: 08/02/22   Representative spoke with at [place]: [place] Determinants of Health ([place]) Interventions SDOH Screenings   Food Insecurity: No Food Insecurity (07/30/2022)  Housing: Low Risk  (07/30/2022)  Transportation Needs: No Transportation Needs (07/30/2022)  Utilities: Not At Risk (07/30/2022)  Tobacco Use: High Risk (07/30/2022)     Readmission Risk Interventions     No data to display

## 2022-08-02 NOTE — TOC Progression Note (Addendum)
Transition of Care [facility]) - Progression Note    Patient Details  Name: Martin Jennings MRN: [id number] Date of Birth: Nov 19, 1950  Transition of Care [facility]) CM/SW [place], LCSW Phone Number: 08/02/2022, 8:30 AM  Clinical Narrative:  [facility] is able to accept PT referral.   9:41 am: Updated patient. He confirmed he was not on oxygen prior to admission. Wife will transport him home at discharge.  12:31 pm: Patient will need home oxygen. Patient does not have DME agency preference. Ordered through [employer]. Asked MD to add RN to home health order.   Expected Discharge Plan: [place] Barriers to Discharge: Continued Medical Work up  Expected Discharge Plan and Services   Discharge Planning Services: CM Consult Post Acute Care Choice: [place] arrangements for the past 2 months: Single Family Home                 DME Arranged: N/A DME Agency: NA       HH Arranged: PT           Social Determinants of Health (SDOH) Interventions SDOH Screenings   Food Insecurity: No Food Insecurity (07/30/2022)  Housing: Low Risk  (07/30/2022)  Transportation Needs: No Transportation Needs (07/30/2022)  Utilities: Not At Risk (07/30/2022)  Tobacco Use: High Risk (07/30/2022)    Readmission Risk Interventions     No data to display

## 2022-08-03 DIAGNOSIS — Z8616 Personal history of COVID-19: Secondary | ICD-10-CM | POA: Diagnosis not present

## 2022-08-03 DIAGNOSIS — Z85118 Personal history of other malignant neoplasm of bronchus and lung: Secondary | ICD-10-CM | POA: Diagnosis not present

## 2022-08-03 DIAGNOSIS — Z95828 Presence of other vascular implants and grafts: Secondary | ICD-10-CM | POA: Diagnosis not present

## 2022-08-03 DIAGNOSIS — N1832 Chronic kidney disease, stage 3b: Secondary | ICD-10-CM | POA: Diagnosis not present

## 2022-08-03 DIAGNOSIS — I739 Peripheral vascular disease, unspecified: Secondary | ICD-10-CM | POA: Diagnosis not present

## 2022-08-03 DIAGNOSIS — Z9221 Personal history of antineoplastic chemotherapy: Secondary | ICD-10-CM | POA: Diagnosis not present

## 2022-08-03 DIAGNOSIS — R6520 Severe sepsis without septic shock: Secondary | ICD-10-CM | POA: Diagnosis not present

## 2022-08-03 DIAGNOSIS — Z906 Acquired absence of other parts of urinary tract: Secondary | ICD-10-CM | POA: Diagnosis not present

## 2022-08-03 DIAGNOSIS — Z936 Other artificial openings of urinary tract status: Secondary | ICD-10-CM | POA: Diagnosis not present

## 2022-08-03 DIAGNOSIS — Z7902 Long term (current) use of antithrombotics/antiplatelets: Secondary | ICD-10-CM | POA: Diagnosis not present

## 2022-08-03 DIAGNOSIS — D696 Thrombocytopenia, unspecified: Secondary | ICD-10-CM | POA: Diagnosis not present

## 2022-08-03 DIAGNOSIS — R000 Tachycardia, unspecified: Secondary | ICD-10-CM | POA: Diagnosis not present

## 2022-08-03 DIAGNOSIS — Z8551 Personal history of malignant neoplasm of bladder: Secondary | ICD-10-CM | POA: Diagnosis not present

## 2022-08-03 DIAGNOSIS — A419 Sepsis, unspecified organism: Secondary | ICD-10-CM | POA: Diagnosis not present

## 2022-08-03 DIAGNOSIS — N179 Acute kidney failure, unspecified: Secondary | ICD-10-CM | POA: Diagnosis not present

## 2022-08-03 DIAGNOSIS — Z720 Tobacco use: Secondary | ICD-10-CM | POA: Diagnosis not present

## 2022-08-03 DIAGNOSIS — I129 Hypertensive chronic kidney disease with stage 1 through stage 4 chronic kidney disease, or unspecified chronic kidney disease: Secondary | ICD-10-CM | POA: Diagnosis not present

## 2022-08-03 DIAGNOSIS — Z9981 Dependence on supplemental oxygen: Secondary | ICD-10-CM | POA: Diagnosis not present

## 2022-08-03 DIAGNOSIS — Z923 Personal history of irradiation: Secondary | ICD-10-CM | POA: Diagnosis not present

## 2022-08-03 DIAGNOSIS — J9601 Acute respiratory failure with hypoxia: Secondary | ICD-10-CM | POA: Diagnosis not present

## 2022-08-03 LAB — CULTURE, BLOOD (ROUTINE X 2)
Culture: NO GROWTH
Culture: NO GROWTH
Special Requests: ADEQUATE

## 2022-08-05 DIAGNOSIS — Z8551 Personal history of malignant neoplasm of bladder: Secondary | ICD-10-CM | POA: Diagnosis not present

## 2022-08-05 DIAGNOSIS — Z85118 Personal history of other malignant neoplasm of bronchus and lung: Secondary | ICD-10-CM | POA: Diagnosis not present

## 2022-08-05 DIAGNOSIS — Z720 Tobacco use: Secondary | ICD-10-CM | POA: Diagnosis not present

## 2022-08-05 DIAGNOSIS — Z906 Acquired absence of other parts of urinary tract: Secondary | ICD-10-CM | POA: Diagnosis not present

## 2022-08-05 DIAGNOSIS — Z7902 Long term (current) use of antithrombotics/antiplatelets: Secondary | ICD-10-CM | POA: Diagnosis not present

## 2022-08-05 DIAGNOSIS — A419 Sepsis, unspecified organism: Secondary | ICD-10-CM | POA: Diagnosis not present

## 2022-08-05 DIAGNOSIS — Z936 Other artificial openings of urinary tract status: Secondary | ICD-10-CM | POA: Diagnosis not present

## 2022-08-05 DIAGNOSIS — J9601 Acute respiratory failure with hypoxia: Secondary | ICD-10-CM | POA: Diagnosis not present

## 2022-08-05 DIAGNOSIS — Z923 Personal history of irradiation: Secondary | ICD-10-CM | POA: Diagnosis not present

## 2022-08-05 DIAGNOSIS — R000 Tachycardia, unspecified: Secondary | ICD-10-CM | POA: Diagnosis not present

## 2022-08-05 DIAGNOSIS — Z8616 Personal history of COVID-19: Secondary | ICD-10-CM | POA: Diagnosis not present

## 2022-08-05 DIAGNOSIS — I739 Peripheral vascular disease, unspecified: Secondary | ICD-10-CM | POA: Diagnosis not present

## 2022-08-05 DIAGNOSIS — Z9981 Dependence on supplemental oxygen: Secondary | ICD-10-CM | POA: Diagnosis not present

## 2022-08-05 DIAGNOSIS — Z95828 Presence of other vascular implants and grafts: Secondary | ICD-10-CM | POA: Diagnosis not present

## 2022-08-05 DIAGNOSIS — D696 Thrombocytopenia, unspecified: Secondary | ICD-10-CM | POA: Diagnosis not present

## 2022-08-05 DIAGNOSIS — Z9221 Personal history of antineoplastic chemotherapy: Secondary | ICD-10-CM | POA: Diagnosis not present

## 2022-08-05 DIAGNOSIS — R6520 Severe sepsis without septic shock: Secondary | ICD-10-CM | POA: Diagnosis not present

## 2022-08-05 DIAGNOSIS — N1832 Chronic kidney disease, stage 3b: Secondary | ICD-10-CM | POA: Diagnosis not present

## 2022-08-05 DIAGNOSIS — N179 Acute kidney failure, unspecified: Secondary | ICD-10-CM | POA: Diagnosis not present

## 2022-08-05 DIAGNOSIS — I129 Hypertensive chronic kidney disease with stage 1 through stage 4 chronic kidney disease, or unspecified chronic kidney disease: Secondary | ICD-10-CM | POA: Diagnosis not present

## 2022-08-10 ENCOUNTER — Other Ambulatory Visit: Payer: Self-pay | Admitting: Family

## 2022-08-10 ENCOUNTER — Ambulatory Visit
Admission: RE | Admit: 2022-08-10 | Discharge: 2022-08-10 | Disposition: A | Discharge status: Home / Self Care | Payer: Medicare Other | Attending: Family | Admitting: Family

## 2022-08-10 ENCOUNTER — Ambulatory Visit
Admission: RE | Admit: 2022-08-10 | Discharge: 2022-08-10 | Disposition: A | Discharge status: Home / Self Care | Payer: Medicare Other | Source: Ambulatory Visit | Attending: Family | Admitting: Family

## 2022-08-10 DIAGNOSIS — J449 Chronic obstructive pulmonary disease, unspecified: Secondary | ICD-10-CM | POA: Diagnosis not present

## 2022-08-10 DIAGNOSIS — J189 Pneumonia, unspecified organism: Secondary | ICD-10-CM

## 2022-08-10 DIAGNOSIS — R052 Subacute cough: Secondary | ICD-10-CM | POA: Diagnosis not present

## 2022-08-12 DIAGNOSIS — Z923 Personal history of irradiation: Secondary | ICD-10-CM | POA: Diagnosis not present

## 2022-08-12 DIAGNOSIS — Z720 Tobacco use: Secondary | ICD-10-CM | POA: Diagnosis not present

## 2022-08-12 DIAGNOSIS — Z8616 Personal history of COVID-19: Secondary | ICD-10-CM | POA: Diagnosis not present

## 2022-08-12 DIAGNOSIS — Z85118 Personal history of other malignant neoplasm of bronchus and lung: Secondary | ICD-10-CM | POA: Diagnosis not present

## 2022-08-12 DIAGNOSIS — I129 Hypertensive chronic kidney disease with stage 1 through stage 4 chronic kidney disease, or unspecified chronic kidney disease: Secondary | ICD-10-CM | POA: Diagnosis not present

## 2022-08-12 DIAGNOSIS — Z8551 Personal history of malignant neoplasm of bladder: Secondary | ICD-10-CM | POA: Diagnosis not present

## 2022-08-12 DIAGNOSIS — A419 Sepsis, unspecified organism: Secondary | ICD-10-CM | POA: Diagnosis not present

## 2022-08-12 DIAGNOSIS — Z7902 Long term (current) use of antithrombotics/antiplatelets: Secondary | ICD-10-CM | POA: Diagnosis not present

## 2022-08-12 DIAGNOSIS — N1832 Chronic kidney disease, stage 3b: Secondary | ICD-10-CM | POA: Diagnosis not present

## 2022-08-12 DIAGNOSIS — Z936 Other artificial openings of urinary tract status: Secondary | ICD-10-CM | POA: Diagnosis not present

## 2022-08-12 DIAGNOSIS — N179 Acute kidney failure, unspecified: Secondary | ICD-10-CM | POA: Diagnosis not present

## 2022-08-12 DIAGNOSIS — R000 Tachycardia, unspecified: Secondary | ICD-10-CM | POA: Diagnosis not present

## 2022-08-12 DIAGNOSIS — Z95828 Presence of other vascular implants and grafts: Secondary | ICD-10-CM | POA: Diagnosis not present

## 2022-08-12 DIAGNOSIS — R6520 Severe sepsis without septic shock: Secondary | ICD-10-CM | POA: Diagnosis not present

## 2022-08-12 DIAGNOSIS — Z906 Acquired absence of other parts of urinary tract: Secondary | ICD-10-CM | POA: Diagnosis not present

## 2022-08-12 DIAGNOSIS — Z9221 Personal history of antineoplastic chemotherapy: Secondary | ICD-10-CM | POA: Diagnosis not present

## 2022-08-12 DIAGNOSIS — I739 Peripheral vascular disease, unspecified: Secondary | ICD-10-CM | POA: Diagnosis not present

## 2022-08-12 DIAGNOSIS — J9601 Acute respiratory failure with hypoxia: Secondary | ICD-10-CM | POA: Diagnosis not present

## 2022-08-12 DIAGNOSIS — D696 Thrombocytopenia, unspecified: Secondary | ICD-10-CM | POA: Diagnosis not present

## 2022-08-12 DIAGNOSIS — Z9981 Dependence on supplemental oxygen: Secondary | ICD-10-CM | POA: Diagnosis not present

## 2022-08-24 DIAGNOSIS — N189 Chronic kidney disease, unspecified: Secondary | ICD-10-CM

## 2022-08-24 DIAGNOSIS — E782 Mixed hyperlipidemia: Secondary | ICD-10-CM

## 2022-08-24 DIAGNOSIS — I129 Hypertensive chronic kidney disease with stage 1 through stage 4 chronic kidney disease, or unspecified chronic kidney disease: Secondary | ICD-10-CM

## 2022-08-28 DIAGNOSIS — J9601 Acute respiratory failure with hypoxia: Secondary | ICD-10-CM | POA: Diagnosis not present

## 2022-08-28 DIAGNOSIS — D696 Thrombocytopenia, unspecified: Secondary | ICD-10-CM | POA: Diagnosis not present

## 2022-08-28 DIAGNOSIS — N1832 Chronic kidney disease, stage 3b: Secondary | ICD-10-CM | POA: Diagnosis not present

## 2022-08-28 DIAGNOSIS — Z9981 Dependence on supplemental oxygen: Secondary | ICD-10-CM | POA: Diagnosis not present

## 2022-08-28 DIAGNOSIS — I129 Hypertensive chronic kidney disease with stage 1 through stage 4 chronic kidney disease, or unspecified chronic kidney disease: Secondary | ICD-10-CM | POA: Diagnosis not present

## 2022-08-28 DIAGNOSIS — Z936 Other artificial openings of urinary tract status: Secondary | ICD-10-CM | POA: Diagnosis not present

## 2022-08-28 DIAGNOSIS — Z95828 Presence of other vascular implants and grafts: Secondary | ICD-10-CM | POA: Diagnosis not present

## 2022-08-28 DIAGNOSIS — R6520 Severe sepsis without septic shock: Secondary | ICD-10-CM | POA: Diagnosis not present

## 2022-08-28 DIAGNOSIS — Z720 Tobacco use: Secondary | ICD-10-CM | POA: Diagnosis not present

## 2022-08-28 DIAGNOSIS — N179 Acute kidney failure, unspecified: Secondary | ICD-10-CM | POA: Diagnosis not present

## 2022-08-28 DIAGNOSIS — A419 Sepsis, unspecified organism: Secondary | ICD-10-CM | POA: Diagnosis not present

## 2022-08-28 DIAGNOSIS — Z7902 Long term (current) use of antithrombotics/antiplatelets: Secondary | ICD-10-CM | POA: Diagnosis not present

## 2022-08-28 DIAGNOSIS — Z923 Personal history of irradiation: Secondary | ICD-10-CM | POA: Diagnosis not present

## 2022-08-28 DIAGNOSIS — I739 Peripheral vascular disease, unspecified: Secondary | ICD-10-CM | POA: Diagnosis not present

## 2022-08-28 DIAGNOSIS — Z8616 Personal history of COVID-19: Secondary | ICD-10-CM | POA: Diagnosis not present

## 2022-08-28 DIAGNOSIS — Z906 Acquired absence of other parts of urinary tract: Secondary | ICD-10-CM | POA: Diagnosis not present

## 2022-08-28 DIAGNOSIS — Z8551 Personal history of malignant neoplasm of bladder: Secondary | ICD-10-CM | POA: Diagnosis not present

## 2022-08-28 DIAGNOSIS — Z9221 Personal history of antineoplastic chemotherapy: Secondary | ICD-10-CM | POA: Diagnosis not present

## 2022-08-28 DIAGNOSIS — Z85118 Personal history of other malignant neoplasm of bronchus and lung: Secondary | ICD-10-CM | POA: Diagnosis not present

## 2022-08-28 DIAGNOSIS — R000 Tachycardia, unspecified: Secondary | ICD-10-CM | POA: Diagnosis not present

## 2022-09-02 DIAGNOSIS — J9600 Acute respiratory failure, unspecified whether with hypoxia or hypercapnia: Secondary | ICD-10-CM | POA: Diagnosis not present

## 2022-09-06 ENCOUNTER — Ambulatory Visit: Payer: Medicare Other

## 2022-09-06 ENCOUNTER — Encounter: Payer: Self-pay | Admitting: Oncology

## 2022-09-08 NOTE — Progress Notes (Addendum)
Follow Up Pharmacist Visit (CCM)  Clinical Summary  Next Pharmacist Follow Up: Not scheduled yet - Focused calls weekly and then will schedule as we go Next AWV: to be scheduled. Summary for PCP: - Patient is dealing with persistent dry cough. He completed course of benzonatate without much relief. Recommended Delsym OTC and will F/U with him in a week. - He was recently hospitalized for hypoxia and has undergone cardiac workup. - His BP was elevated at yesterday's visit and I will F/u with him weekly. He will also need a MACR for DM   Patient's Chronic Conditions: Chronic Kidney Disease (CKD), Hyperlipidemia/Dyslipidemia (HLD), Hypertension (HTN), Diabetes (DM)  Visit Date Visit Completed on: 09/06/2022  Subjective Information Subjective: the cough is not resolved, he mistook the medicine as once a day instead of 3 times a day. Last month he went to the hospital - he sat there and couldn't breathe. His wife called the ambulance. That was the first and last time he had trouble breathing. Recommended OTC Delsym and he needs a refill on his inhaler (albuterol) Otherwise, he sits there watching TV. He has been married 42yr and have 2 children (son and daughter), they have 3 GGK. They get to see 2 of them every weekend. His wife cooks at home just about everyday Lifestyle habits: Diet: He drinks about 1 cup of coffee a day, BF is bacon, sausage, eggs, cereal, oatmeal. [facility] Dinner is cooked at home - chicken, pork chops, mac and cheese, He drinks soda and tea (mainly). He drinks milk, eats cheese, ice cream. Snacks are potato chips, candy, cookies. Fruits are mainly oranges, grapes, peaches.  Exercise: none formally  Smoking - yes, 1/2PPD, quit one time but it just came back and hit him. He has tried to quit and quit for a month or two, but then has to have a taste in his month, He has been in this cycle for year now.  Alcohol : none What is the patient's  sleep pattern?: No sleep issues How many hours per night does patient typically sleep?: 4-7hours, day time naps for 2 hrs.  SDOH: Accountable Health Communities Health-Related Social Needs Screening Tool ([email or web address] SDOH questions were documented and reviewed (EMR or [employer]) within the past 12 months or since hospitalization?: No What is your living situation today? (ref #1): I have a steady place to live Think about the place you live. Do you have problems with any of the following? (ref #2): None of the above Within the past 12 months, you worried that your food would run out before you got money to buy more (ref #3): Never true Within the past 12 months, the food you bought just didn't last and you didn't have money to get more (ref #4): Never true In the past 12 months, has lack of reliable transportation kept you from medical appointments, meetings, work or from getting things needed for daily living? (ref #5): No In the past 12 months, has the electric, gas, oil, or water company threatened to shut off services in your home? (ref #6): No How often does anyone, including family and friends, physically hurt you? (ref #7): Never (1) How often does anyone, including family and friends, insult or talk down to you? (ref #8): Never (1) How often does anyone, including friends and family, threaten you with harm? (ref #9): Never (1) How often does anyone, including family and friends, scream or curse at you? (ref #10): Never (1)  Medication  Adherence Does the [facility] have access to medication refill history?: Yes Medication adherence rates for the STAR metric medications: Atorvastatin 20 mg - 06/08/21 90 DS Medication adherence rates for non-STAR metric medications: Fexofenadine 180 mg - 03/04/22 90 DS Is Patient using [facility] pharmacy?: No Name and location of Current pharmacy: [facility]  Current Rx insurance plan:  [facility] Additional Info: EF 55-60% 07/30/22 ED visit Hypoxia 07/29/22  [employer] mails BP and Anti - coagulant Assessment:: Potentially Non-adherent  Hypertension (HTN) Most Recent BP: 134/78 Most Recent HR: 81 taken on: 08/10/2022 Care Gap: Need BP documented or last BP 140/90 or higher: Needs to be addressed Assessed today?: Yes BP today is: 170/73 Heart Rate is: 88 Goal: <130/80 mmHG Is Patient checking BP at home?: Yes Patient home BP readings are ranging: 120-140/80s Has patient experienced hypotension, dizziness, falls or bradycardia?: No Additional Info: - Asked to check BP for 2-3 times a week - Bp and pulse write it down We discussed: Proper Home BP Measurement, Contacting PCP office for signs and symptoms of high or low blood pressure (hypotension, dizziness, falls, headaches, edema) Assessment:: Uncontrolled Drug: amlodipine 10 mg  Pharmacist Assessment: Appropriate, Query Effectiveness Plan/Follow up: Review : BP logs in 1 week and also F/U on cough (recommended OTC Delsym)  Hyperlipidemia/Dyslipidemia (HLD) Last Lipid panel on: 07/06/2022 TC (Goal<200): 114 LDL: 65 HDL (Goal>40): 35 TG (Goal<150): 64 ASCVD 10-year risk?is:: High (>20%) ASCVD Risk Score: 29.4 % Assessed today?: Yes LDL Goal: <70 We discussed: How a diet high in fruits/vegetables/nuts/whole grains/beans may help to reduce your cholesterol. Increasing soluble fiber intake.  Avoiding sugary foods and trans fat, limiting carbohydrates, and reducing portion sizes. Recommended increasing intake of healthy fats into their diet Assessment:: Controlled Drug: atorvastatin 20 mg Pharmacist Assessment: Appropriate, Effective, Safe, Accessible  Diabetes (DM) Most recent A1C: 6.8 taken on: 07/06/2022 Most Recent GFR: 39 taken on: 07/06/2022 Type: 2 Care Gap: Statin therapy needed: Addressed Care Gap: Need A1c documented or last A1c > 9 %: Addressed Care Gap: Need eye exam documented in EMR or by claim: Needs to be  addressed Care Gap: Need eGFR and uACR for kidney health evaluation: Needs to be addressed Assessed today?: No Plan/Follow up: Start : ACEi/ARB for DM if MACR>30 Order and Review : MACR  Chronic kidney disease (CKD) Most Recent GFR: 39 taken on: 07/06/2022 Previous GFR: 38 taken on: 04/06/2022 Assessed today?: No  Preventative Health Care Gap: Colorectal cancer screening: Needs to be addressed Care Gap: Breast cancer screening: Patient excluded from population (Age > 42, hx of bilateral mastectomy, frailty, hospice services) Care Gap: Annual Wellness Visit (AWV): Needs to be addressed Plan/Follow up: Patient says he has colonoscopy  [facility] Chart Review: 15 min 09/03/22 [facility] Assessment call time spent: 10 min 09/03/22 CPP Office Visit Documentation: 45mns 52secs 09/07/22 CPP Televisit 541ms 39secs 09/06/22 CPP Chart review 1712m 09/06/22  Pharmacist Interventions  Pharmacist Interventions discussed: Yes Discontinued Therapy: Ineffective therapy Monitoring: Routine monitoring

## 2022-09-09 ENCOUNTER — Ambulatory Visit: Admission: RE | Admit: 2022-09-09 | Payer: Medicare Other | Source: Ambulatory Visit

## 2022-09-10 ENCOUNTER — Ambulatory Visit
Admission: RE | Admit: 2022-09-10 | Discharge: 2022-09-10 | Disposition: A | Discharge status: Home / Self Care | Payer: Medicare Other | Source: Ambulatory Visit | Attending: Oncology | Admitting: Oncology

## 2022-09-10 DIAGNOSIS — I700 Atherosclerosis of aorta: Secondary | ICD-10-CM | POA: Diagnosis not present

## 2022-09-10 DIAGNOSIS — D3501 Benign neoplasm of right adrenal gland: Secondary | ICD-10-CM | POA: Diagnosis not present

## 2022-09-10 DIAGNOSIS — K449 Diaphragmatic hernia without obstruction or gangrene: Secondary | ICD-10-CM | POA: Insufficient documentation

## 2022-09-10 DIAGNOSIS — D3502 Benign neoplasm of left adrenal gland: Secondary | ICD-10-CM | POA: Diagnosis not present

## 2022-09-10 DIAGNOSIS — I2510 Atherosclerotic heart disease of native coronary artery without angina pectoris: Secondary | ICD-10-CM | POA: Diagnosis not present

## 2022-09-10 DIAGNOSIS — Z85118 Personal history of other malignant neoplasm of bronchus and lung: Secondary | ICD-10-CM | POA: Insufficient documentation

## 2022-09-10 DIAGNOSIS — J701 Chronic and other pulmonary manifestations due to radiation: Secondary | ICD-10-CM | POA: Diagnosis not present

## 2022-09-10 DIAGNOSIS — Z8551 Personal history of malignant neoplasm of bladder: Secondary | ICD-10-CM | POA: Diagnosis not present

## 2022-09-10 DIAGNOSIS — J479 Bronchiectasis, uncomplicated: Secondary | ICD-10-CM | POA: Diagnosis not present

## 2022-09-10 DIAGNOSIS — C3411 Malignant neoplasm of upper lobe, right bronchus or lung: Secondary | ICD-10-CM | POA: Diagnosis not present

## 2022-09-10 DIAGNOSIS — K8020 Calculus of gallbladder without cholecystitis without obstruction: Secondary | ICD-10-CM | POA: Insufficient documentation

## 2022-09-10 DIAGNOSIS — Z08 Encounter for follow-up examination after completed treatment for malignant neoplasm: Secondary | ICD-10-CM | POA: Insufficient documentation

## 2022-09-13 ENCOUNTER — Inpatient Hospital Stay: Payer: Medicare Other

## 2022-09-13 ENCOUNTER — Inpatient Hospital Stay: Payer: Medicare Other | Attending: Oncology | Admitting: Oncology

## 2022-09-13 ENCOUNTER — Inpatient Hospital Stay (HOSPITAL_BASED_OUTPATIENT_CLINIC_OR_DEPARTMENT_OTHER): Payer: Medicare Other | Admitting: Nurse Practitioner

## 2022-09-13 ENCOUNTER — Encounter: Payer: Self-pay | Admitting: Oncology

## 2022-09-13 VITALS — BP 134/86 | HR 85 | Temp 98.2°F | Resp 18 | Ht 71.0 in | Wt 187.2 lb

## 2022-09-13 DIAGNOSIS — Z79899 Other long term (current) drug therapy: Secondary | ICD-10-CM | POA: Insufficient documentation

## 2022-09-13 DIAGNOSIS — Z85118 Personal history of other malignant neoplasm of bronchus and lung: Secondary | ICD-10-CM | POA: Diagnosis not present

## 2022-09-13 DIAGNOSIS — F17219 Nicotine dependence, cigarettes, with unspecified nicotine-induced disorders: Secondary | ICD-10-CM

## 2022-09-13 DIAGNOSIS — F17210 Nicotine dependence, cigarettes, uncomplicated: Secondary | ICD-10-CM | POA: Diagnosis not present

## 2022-09-13 DIAGNOSIS — Z923 Personal history of irradiation: Secondary | ICD-10-CM | POA: Diagnosis not present

## 2022-09-13 DIAGNOSIS — Z08 Encounter for follow-up examination after completed treatment for malignant neoplasm: Secondary | ICD-10-CM | POA: Diagnosis not present

## 2022-09-13 DIAGNOSIS — I10 Essential (primary) hypertension: Secondary | ICD-10-CM | POA: Diagnosis not present

## 2022-09-13 DIAGNOSIS — Z9221 Personal history of antineoplastic chemotherapy: Secondary | ICD-10-CM | POA: Insufficient documentation

## 2022-09-13 DIAGNOSIS — C3411 Malignant neoplasm of upper lobe, right bronchus or lung: Secondary | ICD-10-CM | POA: Diagnosis not present

## 2022-09-13 DIAGNOSIS — Z7902 Long term (current) use of antithrombotics/antiplatelets: Secondary | ICD-10-CM | POA: Diagnosis not present

## 2022-09-13 DIAGNOSIS — Z87891 Personal history of nicotine dependence: Secondary | ICD-10-CM

## 2022-09-13 DIAGNOSIS — Z452 Encounter for adjustment and management of vascular access device: Secondary | ICD-10-CM | POA: Insufficient documentation

## 2022-09-13 DIAGNOSIS — Z8551 Personal history of malignant neoplasm of bladder: Secondary | ICD-10-CM | POA: Insufficient documentation

## 2022-09-13 LAB — CBC WITH DIFFERENTIAL/PLATELET
Abs Immature Granulocytes: 0.02 10*3/uL (ref 0.00–0.07)
Basophils Absolute: 0 10*3/uL (ref 0.0–0.1)
Basophils Relative: 1 %
Eosinophils Absolute: 0.1 10*3/uL (ref 0.0–0.5)
Eosinophils Relative: 2 %
HCT: 48.5 % (ref 39.0–52.0)
Hemoglobin: 15.8 g/dL (ref 13.0–17.0)
Immature Granulocytes: 0 %
Lymphocytes Relative: 14 %
Lymphs Abs: 0.9 10*3/uL (ref 0.7–4.0)
MCH: 31.3 pg (ref 26.0–34.0)
MCHC: 32.6 g/dL (ref 30.0–36.0)
MCV: 96 fL (ref 80.0–100.0)
Monocytes Absolute: 0.8 10*3/uL (ref 0.1–1.0)
Monocytes Relative: 13 %
Neutro Abs: 4.4 10*3/uL (ref 1.7–7.7)
Neutrophils Relative %: 70 %
Platelets: 181 10*3/uL (ref 150–400)
RBC: 5.05 MIL/uL (ref 4.22–5.81)
RDW: 13.2 % (ref 11.5–15.5)
WBC: 6.3 10*3/uL (ref 4.0–10.5)
nRBC: 0 % (ref 0.0–0.2)

## 2022-09-13 LAB — COMPREHENSIVE METABOLIC PANEL
ALT: 29 U/L (ref 0–44)
AST: 20 U/L (ref 15–41)
Albumin: 3.3 g/dL — ABNORMAL LOW (ref 3.5–5.0)
Alkaline Phosphatase: 58 U/L (ref 38–126)
Anion gap: 10 (ref 5–15)
BUN: 32 mg/dL — ABNORMAL HIGH (ref 8–23)
CO2: 21 mmol/L — ABNORMAL LOW (ref 22–32)
Calcium: 8.7 mg/dL — ABNORMAL LOW (ref 8.9–10.3)
Chloride: 106 mmol/L (ref 98–111)
Creatinine, Ser: 2.08 mg/dL — ABNORMAL HIGH (ref 0.61–1.24)
GFR, Estimated: 33 mL/min — ABNORMAL LOW (ref >60)
Glucose, Bld: 102 mg/dL — ABNORMAL HIGH (ref 70–99)
Potassium: 3.6 mmol/L (ref 3.5–5.1)
Sodium: 137 mmol/L (ref 135–145)
Total Bilirubin: 0.7 mg/dL (ref 0.3–1.2)
Total Protein: 7.4 g/dL (ref 6.5–8.1)

## 2022-09-13 MED ORDER — VARENICLINE TARTRATE 0.5 MG PO TABS: ORAL_TABLET | ORAL | 0 refills | Status: DC

## 2022-09-13 MED ORDER — SODIUM CHLORIDE 0.9% FLUSH
10.0000 mL | Freq: Once | INTRAVENOUS | Status: AC
  Administered 2022-09-13: 10 mL via INTRAVENOUS
  Filled 2022-09-13: qty 10

## 2022-09-13 MED ORDER — HEPARIN SOD (PORK) LOCK FLUSH 100 UNIT/ML IV SOLN
500.0000 [IU] | Freq: Once | INTRAVENOUS | Status: AC
  Administered 2022-09-13: 500 [IU] via INTRAVENOUS
  Filled 2022-09-13: qty 5

## 2022-09-13 NOTE — Progress Notes (Signed)
Patient states that he has not had his port flushed out and he can not remember the last time it was done.

## 2022-09-13 NOTE — Progress Notes (Signed)
Hematology/Oncology Consult note [facility]  Telephone:([phone] [phone]  Patient Care Team: Perrin Maltese, MD as PCP - General (Internal Medicine) Telford Nab, RN as Oncology Nurse Navigator Sindy Guadeloupe, MD as Consulting Physician (Hematology and Oncology) Noreene Filbert, MD as Consulting Physician (Radiation Oncology)   Name of the patient: Martin Jennings  [id number]  1951/03/11   Date of visit: 09/13/22  Diagnosis-history of stage IIIc adenocarcinoma of the lung cT3 cN3 M0   Chief complaint/ Reason for visit-routine follow-up of lung cancer  Heme/Onc history: Patient is a 72 year old male with a history of tobacco dependence who was admitted to the hospital for symptoms of acute onset shortness of breath and was found to have Covid pneumonia.  He underwent CT angio chest which showed extensive mediastinal as well as bilateral hilar adenopathy.  Masslike opacity with architectural distortion involving the right upper lobe measuring 5.7 x 2.9 x 2.3 cm and groundglass opacities bilaterally.  Mass involving the left adrenal gland as well as a smaller right adrenal gland possibly indicating adrenal metastases.     This was followed by a PET CT scan 2 weeks later which showed the consolidative process in the right upper lobe suv 7.88 concerning for infiltrating tumor.  It appears to involve the pleura associated interstitial thickening was also seen.  Bulky right hilar and mediastinal adenopathy.  Enlarged somewhat necrotic appearing left supraclavicular lymph node was also hypermetabolic.  Adrenal gland was not hypermetabolic and was consistent with a benign adenoma.  No metastatic disease involving liver.  No features of distant metastatic disease.   Pathology showed metastatic non-small cell lung cancer which was TTF-1 positive consistent with adenocarcinoma   Patient had concurrent chemoradiation followed by maintenance durvalumab ending in  January 2023.  He has remained in remission since then.    Interval history-patient is trying to quit smoking.  He reports some ongoing fatigue.  ECOG PS- 1 Pain scale- 0   Review of systems- Review of Systems  Constitutional:  Positive for malaise/fatigue. Negative for chills, fever and weight loss.  HENT:  Negative for congestion, ear discharge and nosebleeds.   Eyes:  Negative for blurred vision.  Respiratory:  Negative for cough, hemoptysis, sputum production, shortness of breath and wheezing.   Cardiovascular:  Negative for chest pain, palpitations, orthopnea and claudication.  Gastrointestinal:  Negative for abdominal pain, blood in stool, constipation, diarrhea, heartburn, melena, nausea and vomiting.  Genitourinary:  Negative for dysuria, flank pain, frequency, hematuria and urgency.  Musculoskeletal:  Negative for back pain, joint pain and myalgias.  Skin:  Negative for rash.  Neurological:  Negative for dizziness, tingling, focal weakness, seizures, weakness and headaches.  Endo/Heme/Allergies:  Does not bruise/bleed easily.  Psychiatric/Behavioral:  Negative for depression and suicidal ideas. The patient does not have insomnia.       No Known Allergies   Past Medical History:  Diagnosis Date   Bladder cancer ([place]) 2012   COVID-19 virus infection 04/03/2020   Hypertension    Lung cancer ([place])    Peripheral vascular disease ([place])      Past Surgical History:  Procedure Laterality Date   BLADDER REMOVAL     LOWER EXTREMITY ANGIOGRAPHY Left 04/12/2017   Procedure: Lower Extremity Angiography;  Surgeon: Katha Cabal, MD;  Location: [place] [facility];  Service: Cardiovascular;  Laterality: Left;   PORTA CATH INSERTION N/A 05/22/2020   Procedure: PORTA CATH INSERTION;  Surgeon: Algernon Huxley, MD;  Location: [facility]  INVASIVE CV LAB;  Service: Cardiovascular;  Laterality: N/A;   uretostomy     VASCULAR SURGERY      Social History   Socioeconomic History    Marital status: Married    Spouse name: Not on file   Number of children: Not on file   Years of education: Not on file   Highest education level: Not on file  Occupational History   Not on file  Tobacco Use   Smoking status: Some Days    Packs/day: 0.50    Years: 50.00    Total pack years: 25.00    Types: Cigarettes   Smokeless tobacco: Never   Tobacco comments:    Per pt. He smokes 1 pack every 3 days  Vaping Use   Vaping Use: Never used  Substance and Sexual Activity   Alcohol use: No   Drug use: Not Currently    Types: Marijuana   Sexual activity: Not Currently  Other Topics Concern   Not on file  Social History Narrative   Not on file   Social Determinants of Health   Financial Resource Strain: Not on file  Food Insecurity: No Food Insecurity (07/30/2022)   Hunger Vital Sign    Worried About Running Out of Food in the Last Year: Never true    Ran Out of Food in the Last Year: Never true  Transportation Needs: No Transportation Needs (07/30/2022)   PRAPARE - Hydrologist (Medical): No    Lack of Transportation (Non-Medical): No  Physical Activity: Not on file  Stress: Not on file  Social Connections: Not on file  Intimate Partner Violence: Not At Risk (07/30/2022)   Humiliation, Afraid, Rape, and Kick questionnaire    Fear of Current or Ex-Partner: No    Emotionally Abused: No    Physically Abused: No    Sexually Abused: No    History reviewed. No pertinent family history.   Current Outpatient Medications:    albuterol (VENTOLIN HFA) 108 (90 Base) MCG/ACT inhaler, Inhale into the lungs., Disp: , Rfl:    amLODipine (NORVASC) 10 MG tablet, Take 10 mg by mouth daily. , Disp: , Rfl:    atorvastatin (LIPITOR) 20 MG tablet, Take 20 mg by mouth every evening. , Disp: , Rfl:    cholecalciferol (VITAMIN D3) 25 MCG (1000 UNIT) tablet, Take 1,000 Units by mouth daily., Disp: , Rfl:    clopidogrel (PLAVIX) 75 MG tablet, Take 75 mg by mouth  daily. , Disp: , Rfl:    colchicine 0.6 MG tablet, Take 0.6 mg by mouth daily., Disp: , Rfl:    fexofenadine (ALLEGRA) 180 MG tablet, Take 180 mg by mouth daily as needed., Disp: , Rfl:  No current facility-administered medications for this visit.  Facility-Administered Medications Ordered in Other Visits:    sodium chloride flush (NS) 0.9 % injection 10 mL, 10 mL, Intravenous, PRN, Sindy Guadeloupe, MD, 10 mL at 09/18/20 0850  Physical exam:  Vitals:   09/13/22 1312  BP: 134/86  Pulse: 85  Resp: 18  Temp: 98.2 F (36.8 C)  TempSrc: Tympanic  SpO2: 93%  Weight: 187 lb 3.2 oz (84.9 kg)  Height: 5\' 11"  (1.803 m)   Physical Exam Cardiovascular:     Rate and Rhythm: Normal rate and regular rhythm.     Heart sounds: Normal heart sounds.  Pulmonary:     Effort: Pulmonary effort is normal.     Breath sounds: Normal breath sounds.  Abdominal:  General: Bowel sounds are normal.     Palpations: Abdomen is soft.  Skin:    General: Skin is warm and dry.  Neurological:     Mental Status: He is alert and oriented to person, place, and time.         Latest Ref Rng & Units 07/31/2022    4:40 AM  CMP  Glucose 70 - 99 mg/dL 92   BUN 8 - 23 mg/dL 27   Creatinine 0.61 - 1.24 mg/dL 1.78   Sodium 135 - 145 mmol/L 137   Potassium 3.5 - 5.1 mmol/L 3.7   Chloride 98 - 111 mmol/L 101   CO2 22 - 32 mmol/L 26   Calcium 8.9 - 10.3 mg/dL 8.3       Latest Ref Rng & Units 08/02/2022    3:51 AM  CBC  WBC 4.0 - 10.5 K/uL 2.3   Hemoglobin 13.0 - 17.0 g/dL 15.7   Hematocrit 39.0 - 52.0 % 48.0   Platelets 150 - 400 K/uL 130     No images are attached to the encounter.  CT CHEST ABDOMEN PELVIS WO CONTRAST  Result Date: 09/10/2022 CLINICAL DATA:  Stage III right upper lobe lung adenocarcinoma diagnosed 2021 status post concurrent chemoradiation therapy and maintenance immunotherapy. Restaging. Additional history of bladder cancer status post cystoprostatectomy. * Tracking Code: BO * EXAM: CT  CHEST, ABDOMEN AND PELVIS WITHOUT CONTRAST TECHNIQUE: Multidetector CT imaging of the chest, abdomen and pelvis was performed following the standard protocol without IV contrast. RADIATION DOSE REDUCTION: This exam was performed according to the departmental dose-optimization program which includes automated exposure control, adjustment of the mA and/or kV according to patient size and/or use of iterative reconstruction technique. COMPARISON:  05/04/2022 CT chest, abdomen and pelvis. FINDINGS: CT CHEST FINDINGS Cardiovascular: Normal heart size. No significant pericardial effusion/thickening. Three-vessel coronary atherosclerosis. Right internal jugular Port-A-Cath terminates in the upper third of the SVC. Atherosclerotic nonaneurysmal thoracic aorta. Dilated main pulmonary artery (3.6 cm diameter). Mediastinum/Nodes: No significant thyroid nodules. Unremarkable esophagus. No pathologically enlarged axillary, mediastinal or hilar lymph nodes. Lungs/Pleura: No pneumothorax. No pleural effusion. Moderate centrilobular emphysema with mild diffuse bronchial wall thickening. Sharply marginated patchy bandlike consolidation in right upper lobe with associated volume loss and distortion, unchanged, compatible with radiation fibrosis. No acute consolidative airspace disease or significant pulmonary nodules. Background moderate patchy peripheral reticulation and ground-glass opacity in both lungs with associated mild traction bronchiectasis and architectural distortion, not appreciably changed. No frank honeycombing. Musculoskeletal: No aggressive appearing focal osseous lesions. Mild thoracic spondylosis. CT ABDOMEN PELVIS FINDINGS Hepatobiliary: Normal liver size. Subcentimeter hypodense left liver dome lesion is too small to characterize and is unchanged, considered benign. No new liver lesions. Cholelithiasis. No biliary ductal dilatation. Pancreas: Normal, with no mass or duct dilation. Spleen: Normal size. No mass.  Adrenals/Urinary Tract: Stable bilateral adrenal adenomas measuring 2.2 cm on the left with density -4 HU, 1.2 cm inferiorly on the right with density -4 HU and 1.1 cm superiorly on the right with density -4 HU. No hydronephrosis. No renal stones. Simple 1.3 cm posterior left upper renal cyst, for which no follow-up imaging is recommended. No additional contour deforming renal lesions. Status post cystectomy and ileal conduit urinary diversion in the ventral right abdominal wall. Ileal conduit is decompressed and appears normal. Ureters are within normal limits with no ureteral stones. No cystectomy bed mass. Stomach/Bowel: Small hiatal hernia. Otherwise normal nondistended stomach. Stable enteroenterostomy in the lower right abdomen. No dilated or thick walled small  bowel loops. Normal appendix. Normal large bowel with no diverticulosis, large bowel wall thickening or pericolonic fat stranding. Vascular/Lymphatic: Atherosclerotic nonaneurysmal abdominal aorta. Stable bilateral common iliac artery and external iliac artery stents. Status post femoral-femoral arterial bypass. No pathologically enlarged lymph nodes in the abdomen or pelvis. Reproductive: Prostatectomy. Other: No pneumoperitoneum, ascites or focal fluid collection. Musculoskeletal: No aggressive appearing focal osseous lesions. Mild lumbar spondylosis. IMPRESSION: 1. Stable radiation fibrosis in the right upper lobe, with no evidence of local tumor recurrence. 2. No evidence of metastatic disease in the chest, abdomen or pelvis. 3. Stable postsurgical changes from cystoprostatectomy and ileal conduit urinary diversion. No evidence of local tumor recurrence. No hydronephrosis. 4. Chronic findings include: Stable bilateral adrenal adenomas, for which no follow-up imaging is recommended. Dilated main pulmonary artery, suggesting pulmonary arterial hypertension. Three-vessel coronary atherosclerosis. Cholelithiasis. Small hiatal hernia. Aortic  Atherosclerosis (ICD10-I70.0) and Emphysema (ICD10-J43.9). Electronically Signed   By: Ilona Sorrel M.D.   On: 09/10/2022 13:34     Assessment and plan- Patient is a 72 y.o. male with history of stage III adenocarcinoma of the lung s/p concurrent chemoradiation followed by maintenance durvalumab.  He is here for a routine surveillance visit of lung cancer  Patient completed 1 year of maintenance durvalumab in Jan 2023.  I have reviewed his recent CT chest abdomen and pelvis images independently and discussed findings with the patient which overall showsNo evidence of recurrent or progressive disease.  I will repeat his CT scans in 6 months and see him thereafter.  His CBC is normal and therefore his fatigue is not secondary to her anemia.  I will refer him to our smoking cessation clinic as he is interested in trying alternative medications to quit smoking.  Nicotine patches have not worked for him.  We will plan to take his port out at this time   Visit Diagnosis 1. Encounter for follow-up surveillance of lung cancer      Dr. Randa Evens, MD, MPH [facility] at [facility] [phone] 09/13/2022 1:17 PM

## 2022-09-13 NOTE — Patient Instructions (Addendum)
It was great to meet you today and I'm excited to help you with your smoking cessation journey. As we discussed I recommend starting nicotine patches for long acting nicotine support. Start with a 21 mg patch, applied every 24 hours. You can rotate sites to avoid skin irritation. Patch can be worn while bathing or showering. For short acting nicotine support and cravings, use nicotine gum or lozenges. I recommend the 4mg  piece of gum. These are very helpful to ease breakthrough withdrawal symptoms. remember the bite and pocket/park process, continue for about 30 minutes until tingling stops. I've also sent the prescription for the chantix. I'll see you back in a week.   Consider enrolling in the [place] program which can be purchased by going online to [email or web address]/quitmsart-kit    Please let me know if you have trouble getting or using medications. Ander Purpura, NP

## 2022-09-13 NOTE — Progress Notes (Signed)
Survivorship Care Plan visit completed.  Treatment summary reviewed and given to patient.  ASCO answers booklet reviewed and given to patient.  CARE program and Cancer Transitions discussed with patient along with other resources cancer center offers to patients and caregivers.  Patient verbalized understanding.    

## 2022-09-13 NOTE — Progress Notes (Signed)
Smoking Cessation [place]  Telephone:(336) [phone] Fax:(336) [phone]  Patient Care Team: Perrin Maltese, MD as PCP - General (Internal Medicine) Telford Nab, RN as Oncology Nurse Navigator Sindy Guadeloupe, MD as Consulting Physician (Hematology and Oncology) Noreene Filbert, MD as Consulting Physician (Radiation Oncology)   Name of the patient: Martin Jennings  [id number]  1950/10/24   Date of visit: 09/13/2022   Diagnosis- Tobacco dependence  Chief complaint/ Reason for visit- Smoking Cessation counseling  PMH-  Past Medical History:  Diagnosis Date   Bladder cancer ([place]) 2012   COVID-19 virus infection 04/03/2020   Hypertension    Lung cancer ([place])    Peripheral vascular disease ([place])    History of Presenting Illness- Martin Jennings is a 72 yo patient who presents to smoking cessation clinic to discuss strategies and techniques to quit smoking. The goal of this program is to implement proven ineffective interventions in a clinical setting to successfully quit smoking.   During our visit today, we will discuss strategies for tobacco cessation.   Smoking history: Tobacco Use: High Risk (09/13/2022)   Patient History    Smoking Tobacco Use: Some Days    Smokeless Tobacco Use: Never    Passive Exposure: Not on file    He uses cigarettes. He quits smoking routinely for about 4 weeks then begins slowly smoking again. He did quit through cancer treatments which was his longest period of time quit. He feels that habit and psychological cravings cause him to relapse. He used patches previously without success and says he smoked with patches on. He has not tried other other medications or gums. He is very ready to quit and would like to start quitting asap.   ECOG FS:1 - Symptomatic but completely ambulatory  Review of systems- Review of Systems  Constitutional:  Negative for chills, fever, malaise/fatigue and weight loss.  HENT:  Negative for  hearing loss, nosebleeds, sore throat and tinnitus.   Eyes:  Negative for blurred vision and double vision.  Respiratory:  Negative for cough, hemoptysis, shortness of breath and wheezing.   Cardiovascular:  Negative for chest pain, palpitations and leg swelling.  Gastrointestinal:  Negative for abdominal pain, blood in stool, constipation, diarrhea, melena, nausea and vomiting.  Genitourinary:  Negative for dysuria and urgency.  Musculoskeletal:  Negative for back pain, falls, joint pain and myalgias.  Skin:  Negative for itching and rash.  Neurological:  Negative for dizziness, tingling, sensory change, loss of consciousness, weakness and headaches.  Endo/Heme/Allergies:  Negative for environmental allergies. Does not bruise/bleed easily.  Psychiatric/Behavioral:  Negative for depression. The patient is not nervous/anxious and does not have insomnia.     No Known Allergies  Past Medical History:  Diagnosis Date   Bladder cancer ([place]) 2012   COVID-19 virus infection 04/03/2020   Hypertension    Lung cancer ([place])    Peripheral vascular disease ([place])    Past Surgical History:  Procedure Laterality Date   BLADDER REMOVAL     LOWER EXTREMITY ANGIOGRAPHY Left 04/12/2017   Procedure: Lower Extremity Angiography;  Surgeon: Katha Cabal, MD;  Location: [facility];  Service: Cardiovascular;  Laterality: Left;   PORTA CATH INSERTION N/A 05/22/2020   Procedure: PORTA CATH INSERTION;  Surgeon: Algernon Huxley, MD;  Location: [facility];  Service: Cardiovascular;  Laterality: N/A;   uretostomy     VASCULAR SURGERY     Social History   Socioeconomic History   Marital  status: Married    Spouse name: Not on file   Number of children: Not on file   Years of education: Not on file   Highest education level: Not on file  Occupational History   Not on file  Tobacco Use   Smoking status: Some Days    Packs/day: 0.50    Years: 50.00    Total pack years: 25.00    Types:  Cigarettes   Smokeless tobacco: Never   Tobacco comments:    Per pt. He smokes 1 pack every 3 days  Vaping Use   Vaping Use: Never used  Substance and Sexual Activity   Alcohol use: No   Drug use: Not Currently    Types: Marijuana   Sexual activity: Not Currently  Other Topics Concern   Not on file  Social History Narrative   Not on file   Social Determinants of Health   Financial Resource Strain: Not on file  Food Insecurity: No Food Insecurity (07/30/2022)   Hunger Vital Sign    Worried About Running Out of Food in the Last Year: Never true    Ran Out of Food in the Last Year: Never true  Transportation Needs: No Transportation Needs (07/30/2022)   PRAPARE - Hydrologist (Medical): No    Lack of Transportation (Non-Medical): No  Physical Activity: Not on file  Stress: Not on file  Social Connections: Not on file  Intimate Partner Violence: Not At Risk (07/30/2022)   Humiliation, Afraid, Rape, and Kick questionnaire    Fear of Current or Ex-Partner: No    Emotionally Abused: No    Physically Abused: No    Sexually Abused: No   No family history on file.  Current Outpatient Medications:    albuterol (VENTOLIN HFA) 108 (90 Base) MCG/ACT inhaler, Inhale into the lungs., Disp: , Rfl:    amLODipine (NORVASC) 10 MG tablet, Take 10 mg by mouth daily. , Disp: , Rfl:    atorvastatin (LIPITOR) 20 MG tablet, Take 20 mg by mouth every evening. , Disp: , Rfl:    cholecalciferol (VITAMIN D3) 25 MCG (1000 UNIT) tablet, Take 1,000 Units by mouth daily., Disp: , Rfl:    clopidogrel (PLAVIX) 75 MG tablet, Take 75 mg by mouth daily. , Disp: , Rfl:    colchicine 0.6 MG tablet, Take 0.6 mg by mouth daily., Disp: , Rfl:    fexofenadine (ALLEGRA) 180 MG tablet, Take 180 mg by mouth daily as needed., Disp: , Rfl:  No current facility-administered medications for this visit.  Facility-Administered Medications Ordered in Other Visits:    sodium chloride flush (NS) 0.9 %  injection 10 mL, 10 mL, Intravenous, PRN, Sindy Guadeloupe, MD, 10 mL at 09/18/20 9030  Physical exam: There were no vitals filed for this visit. Physical Exam Constitutional:      Appearance: He is not ill-appearing.  Cardiovascular:     Rate and Rhythm: Normal rate.  Pulmonary:     Comments: Diminished, strong cough Neurological:     Mental Status: He is alert and oriented to person, place, and time.        Latest Ref Rng & Units 09/13/2022   12:55 PM  CMP  Glucose 70 - 99 mg/dL 102   BUN 8 - 23 mg/dL 32   Creatinine 0.61 - 1.24 mg/dL 2.08   Sodium 135 - 145 mmol/L 137   Potassium 3.5 - 5.1 mmol/L 3.6   Chloride 98 - 111 mmol/L 106  CO2 22 - 32 mmol/L 21   Calcium 8.9 - 10.3 mg/dL 8.7   Total Protein 6.5 - 8.1 g/dL 7.4   Total Bilirubin 0.3 - 1.2 mg/dL 0.7   Alkaline Phos 38 - 126 U/L 58   AST 15 - 41 U/L 20   ALT 0 - 44 U/L 29       Latest Ref Rng & Units 09/13/2022   12:55 PM  CBC  WBC 4.0 - 10.5 K/uL 6.3   Hemoglobin 13.0 - 17.0 g/dL 15.8   Hematocrit 39.0 - 52.0 % 48.5   Platelets 150 - 400 K/uL 181    No images are attached to the encounter.  CT CHEST ABDOMEN PELVIS WO CONTRAST  Result Date: 09/10/2022 CLINICAL DATA:  Stage III right upper lobe lung adenocarcinoma diagnosed 2021 status post concurrent chemoradiation therapy and maintenance immunotherapy. Restaging. Additional history of bladder cancer status post cystoprostatectomy. * Tracking Code: BO * EXAM: CT CHEST, ABDOMEN AND PELVIS WITHOUT CONTRAST TECHNIQUE: Multidetector CT imaging of the chest, abdomen and pelvis was performed following the standard protocol without IV contrast. RADIATION DOSE REDUCTION: This exam was performed according to the departmental dose-optimization program which includes automated exposure control, adjustment of the mA and/or kV according to patient size and/or use of iterative reconstruction technique. COMPARISON:  05/04/2022 CT chest, abdomen and pelvis. FINDINGS: CT CHEST  FINDINGS Cardiovascular: Normal heart size. No significant pericardial effusion/thickening. Three-vessel coronary atherosclerosis. Right internal jugular Port-A-Cath terminates in the upper third of the SVC. Atherosclerotic nonaneurysmal thoracic aorta. Dilated main pulmonary artery (3.6 cm diameter). Mediastinum/Nodes: No significant thyroid nodules. Unremarkable esophagus. No pathologically enlarged axillary, mediastinal or hilar lymph nodes. Lungs/Pleura: No pneumothorax. No pleural effusion. Moderate centrilobular emphysema with mild diffuse bronchial wall thickening. Sharply marginated patchy bandlike consolidation in right upper lobe with associated volume loss and distortion, unchanged, compatible with radiation fibrosis. No acute consolidative airspace disease or significant pulmonary nodules. Background moderate patchy peripheral reticulation and ground-glass opacity in both lungs with associated mild traction bronchiectasis and architectural distortion, not appreciably changed. No frank honeycombing. Musculoskeletal: No aggressive appearing focal osseous lesions. Mild thoracic spondylosis. CT ABDOMEN PELVIS FINDINGS Hepatobiliary: Normal liver size. Subcentimeter hypodense left liver dome lesion is too small to characterize and is unchanged, considered benign. No new liver lesions. Cholelithiasis. No biliary ductal dilatation. Pancreas: Normal, with no mass or duct dilation. Spleen: Normal size. No mass. Adrenals/Urinary Tract: Stable bilateral adrenal adenomas measuring 2.2 cm on the left with density -4 HU, 1.2 cm inferiorly on the right with density -4 HU and 1.1 cm superiorly on the right with density -4 HU. No hydronephrosis. No renal stones. Simple 1.3 cm posterior left upper renal cyst, for which no follow-up imaging is recommended. No additional contour deforming renal lesions. Status post cystectomy and ileal conduit urinary diversion in the ventral right abdominal wall. Ileal conduit is  decompressed and appears normal. Ureters are within normal limits with no ureteral stones. No cystectomy bed mass. Stomach/Bowel: Small hiatal hernia. Otherwise normal nondistended stomach. Stable enteroenterostomy in the lower right abdomen. No dilated or thick walled small bowel loops. Normal appendix. Normal large bowel with no diverticulosis, large bowel wall thickening or pericolonic fat stranding. Vascular/Lymphatic: Atherosclerotic nonaneurysmal abdominal aorta. Stable bilateral common iliac artery and external iliac artery stents. Status post femoral-femoral arterial bypass. No pathologically enlarged lymph nodes in the abdomen or pelvis. Reproductive: Prostatectomy. Other: No pneumoperitoneum, ascites or focal fluid collection. Musculoskeletal: No aggressive appearing focal osseous lesions. Mild lumbar spondylosis. IMPRESSION: 1. Stable  radiation fibrosis in the right upper lobe, with no evidence of local tumor recurrence. 2. No evidence of metastatic disease in the chest, abdomen or pelvis. 3. Stable postsurgical changes from cystoprostatectomy and ileal conduit urinary diversion. No evidence of local tumor recurrence. No hydronephrosis. 4. Chronic findings include: Stable bilateral adrenal adenomas, for which no follow-up imaging is recommended. Dilated main pulmonary artery, suggesting pulmonary arterial hypertension. Three-vessel coronary atherosclerosis. Cholelithiasis. Small hiatal hernia. Aortic Atherosclerosis (ICD10-I70.0) and Emphysema (ICD10-J43.9). Electronically Signed   By: Ilona Sorrel M.D.   On: 09/10/2022 13:34     Assessment and plan- Patient is a 72 y.o. male who presents to smoking cessation clinic to discuss strategies, medications and interventions needed to quit smoking.  Tobacco Use Disorder- I addressed the importance of smoking cessation with the patient in detail.  We discussed the health benefits of cessation. We discussed the health detriments of ongoing tobacco use  including but not limited to COPD, heart disease and malignancy. We discussed that given co-morbidities it is especially important that patient quit smoking.   We discussed the pathophysiology of nicotine dependence and different strategies to stop smoking. We reviewed strategies to maximize success including removing cigarettes and smoking materials from environment, stress management, substitution of other forms of reinforcement, support of family/friends, written materials, local smoking cessation programs such as QuitSmart, and pharmacotherapy (Chantix, Nicotine Patches/Gums/Lozenges). We discussed that the gold standard of tobacco cessation is nicotine replacement (with nicotine patches & gum/lozenges) or Chantix or Bupropion.   We discussed that the typical craving/urge to smoke lasts about 3-5 minutes;  biggest trigger(s) to use cigarettes are stress and habit.  Encouraged patient to set some new "rules" that do not allow smoking in the car, instead use a piece of nicotine gum or a lozenge when experiencing a craving. He has self discontinued coffee as that was a trigger for him. Restricting how many cigarettes he has on hand. Instructions on how to use these nicotine replacement products was discussed.  I shared that there is limited data to suggest that e-cigarettes help in smoking cessation efforts; we do know that many of the flavorings in e-cigarettes have known carcinogens.    Given likely low to moderate dependence (Fagenstrom score 3), patient will need continued support with regards to smoking cessation. We discussed that data suggests that "cold [place]" is the least effective way to stop using tobacco products.  Having a clear "quit plan" is much more effective and requires a step-wise approach with continued support from a tobacco treatment specialist.   We reviewed the multiple options for cessation and I offered that in my experience, starting the [facility] program followed by  pharmacologic interventions has had the most success with documented success rates of > 65%. I recommended that patient consider enrolling in the [facility] program which can be purchased by going online to https://quitsmart.com/product/quitmsart-kit. Unfortunately, these programs are no longer offered through [facility]. We do offer resources through Quit Now which includes rree telephonic Coaching. You can also consider Quit Smoking Apps like QuitSTART and QuitGuide, Online resources through [employer]. I encouraged him to reach out to me if he has further questions or interest in his continued cessation efforts.  Recommend continued discussions and management by PCP and primary oncology team.   Topics covered: Improve having tobacco in the car Change in daily routines Dealing with urges to smoke Getting support Anticipating/avoiding triggers Secondhand smoke Behavioral skills Reinforced benefits  Reviewed pharmacotherapy:  Nicotine replacement therapy (NRT) gum or lozenge- given  use of cigarette within 30 minutes of waking up, I recommend use of 4 mg piece of gum. We reviewed that use of gum for ease of withdrawal symptoms can help him to be successful. Consider use of gum as something to help with breakthrough symtpoms. Overtime, we can reduce dose to a 2 mg dose of gum. We reviewed use as biting down slowly on the gum until tingle in the mouth is felt then park the gum inside the cheek and gum, hold for a minute to let nicotine absorb then repeat the chew and park process until the tingling stops (usually 30 minutes).   Nicotine Patch for long acting nicotine support. Given his use of approximately 4-10 cigarettes a day he will start with a 21 mg patch which can be applied for 24 hours. He will rotate sites to avoid skin irritation. Patch can be worn while showering/wet. We can typically reduce the patch dose down to 14 mg patch/day then eventually 7 mg patch/day.   Verenicline: On days 1 through  3, take 0.5 mg by mouth every a.m.; days 4 through 7, take 0.5 mg p.o. twice daily; days 8-28, 1 mg by mouth twice a day then maintenance at that dose. Monitor for neuropsychiatric symptoms. His creatinine is borderline for full strength dosing of chantix and will need to be monitored. If CrCl < 30, recommend dose reduction to maximum maintenance dose of 0.5 mg twice daily.   Follow-up plan:  Based on our discussion today, you have decided to initiate Nicotine patches, nicotine gum, and chantix.  Based on our discussion today, your quit date is 09/13/22.  We will plan to speak by telephone or in person in 1 week to follow up.    Visit Diagnosis 1. Personal history of tobacco use, presenting hazards to health   2. Cigarette nicotine dependence with nicotine-induced disorder    Patient expressed understanding and was in agreement with this plan. He also understands that He can call clinic at any time with any questions, concerns, or complaints.   Greater than 50% was spent in counseling and coordination of care with this patient including but not limited to discussion of the relevant topics above (See A&P) including, but not limited to diagnosis and management of acute and chronic medical conditions.   Greater than 30 minutes was spent in smoking cessation counseling with this patient.   Thank you for allowing me to participate in the care of this very pleasant patient.    Verlon Au, NP [facility] 09/13/2022

## 2022-09-16 ENCOUNTER — Other Ambulatory Visit: Payer: Self-pay | Admitting: Family

## 2022-09-16 ENCOUNTER — Other Ambulatory Visit: Payer: Self-pay | Admitting: Internal Medicine

## 2022-09-20 ENCOUNTER — Inpatient Hospital Stay: Payer: Medicare Other | Admitting: Nurse Practitioner

## 2022-09-20 ENCOUNTER — Encounter: Payer: Self-pay | Admitting: Nurse Practitioner

## 2022-09-20 NOTE — Progress Notes (Signed)
Patient di dnot answer for virtual visit. I've asked scheduling to contact him to reschedule.

## 2022-09-21 ENCOUNTER — Inpatient Hospital Stay (HOSPITAL_BASED_OUTPATIENT_CLINIC_OR_DEPARTMENT_OTHER): Payer: Medicare Other | Admitting: Nurse Practitioner

## 2022-09-21 DIAGNOSIS — F17219 Nicotine dependence, cigarettes, with unspecified nicotine-induced disorders: Secondary | ICD-10-CM

## 2022-09-21 NOTE — Progress Notes (Signed)
Patient was not available when I called for virtual visit. I've asked scheduling to contact him to be rescheduled at his convenience.

## 2022-09-23 ENCOUNTER — Inpatient Hospital Stay: Payer: Medicare Other | Admitting: Nurse Practitioner

## 2022-09-23 DIAGNOSIS — N189 Chronic kidney disease, unspecified: Secondary | ICD-10-CM | POA: Diagnosis not present

## 2022-09-23 DIAGNOSIS — I129 Hypertensive chronic kidney disease with stage 1 through stage 4 chronic kidney disease, or unspecified chronic kidney disease: Secondary | ICD-10-CM | POA: Diagnosis not present

## 2022-09-23 DIAGNOSIS — E782 Mixed hyperlipidemia: Secondary | ICD-10-CM | POA: Diagnosis not present

## 2022-09-24 ENCOUNTER — Encounter: Payer: Self-pay | Admitting: Nurse Practitioner

## 2022-10-01 DIAGNOSIS — J9600 Acute respiratory failure, unspecified whether with hypoxia or hypercapnia: Secondary | ICD-10-CM | POA: Diagnosis not present

## 2022-10-05 ENCOUNTER — Ambulatory Visit: Payer: Medicare Other | Admitting: Family

## 2022-10-11 ENCOUNTER — Encounter: Payer: Self-pay | Admitting: Family

## 2022-10-11 ENCOUNTER — Ambulatory Visit (INDEPENDENT_AMBULATORY_CARE_PROVIDER_SITE_OTHER): Payer: Medicare Other | Admitting: Family

## 2022-10-11 VITALS — BP 130/80 | HR 72 | Ht 71.0 in | Wt 193.2 lb

## 2022-10-11 DIAGNOSIS — E279 Disorder of adrenal gland, unspecified: Secondary | ICD-10-CM | POA: Diagnosis not present

## 2022-10-11 DIAGNOSIS — R0989 Other specified symptoms and signs involving the circulatory and respiratory systems: Secondary | ICD-10-CM

## 2022-10-11 DIAGNOSIS — E559 Vitamin D deficiency, unspecified: Secondary | ICD-10-CM

## 2022-10-11 DIAGNOSIS — E538 Deficiency of other specified B group vitamins: Secondary | ICD-10-CM | POA: Diagnosis not present

## 2022-10-11 DIAGNOSIS — I10 Essential (primary) hypertension: Secondary | ICD-10-CM | POA: Diagnosis not present

## 2022-10-11 DIAGNOSIS — N1832 Chronic kidney disease, stage 3b: Secondary | ICD-10-CM | POA: Diagnosis not present

## 2022-10-11 DIAGNOSIS — R5383 Other fatigue: Secondary | ICD-10-CM | POA: Diagnosis not present

## 2022-10-11 DIAGNOSIS — E782 Mixed hyperlipidemia: Secondary | ICD-10-CM

## 2022-10-11 DIAGNOSIS — E1165 Type 2 diabetes mellitus with hyperglycemia: Secondary | ICD-10-CM | POA: Diagnosis not present

## 2022-10-11 DIAGNOSIS — J449 Chronic obstructive pulmonary disease, unspecified: Secondary | ICD-10-CM | POA: Diagnosis not present

## 2022-10-11 DIAGNOSIS — R7303 Prediabetes: Secondary | ICD-10-CM

## 2022-10-11 HISTORY — DX: Chronic obstructive pulmonary disease, unspecified: J44.9

## 2022-10-11 NOTE — Progress Notes (Signed)
Established Patient Office Visit  Subjective:  Patient ID: Martin Jennings, male    DOB: 1950-10-11  Age: 72 y.o. MRN: [id number]  Chief Complaint  Patient presents with   Follow-up    3 month follow up    Patient is here today for his 3 months follow up.  He has been feeling fairly well since last appointment.   He does not have additional concerns to discuss today.  Labs are due today. He needs refills.   I have reviewed his active problem list, medication list, allergies, social history, notes from last encounter, lab results for his appointment today.    No other concerns at this time.   Past Medical History:  Diagnosis Date   Bladder cancer ([place]) 2012   COVID-19 virus infection 04/03/2020   Hypertension    Lung cancer ([place])    Peripheral vascular disease [facility])     Past Surgical History:  Procedure Laterality Date   BLADDER REMOVAL     LOWER EXTREMITY ANGIOGRAPHY Left 04/12/2017   Procedure: Lower Extremity Angiography;  Surgeon: Katha Cabal, MD;  Location: [place] [facility];  Service: Cardiovascular;  Laterality: Left;   PORTA CATH INSERTION N/A 05/22/2020   Procedure: PORTA CATH INSERTION;  Surgeon: Algernon Huxley, MD;  Location: [place] [facility];  Service: Cardiovascular;  Laterality: N/A;   uretostomy     VASCULAR SURGERY      Social History   Socioeconomic History   Marital status: Married    Spouse name: Not on file   Number of children: Not on file   Years of education: Not on file   Highest education level: Not on file  Occupational History   Not on file  Tobacco Use   Smoking status: Some Days    Packs/day: 0.50    Years: 50.00    Additional pack years: 0.00    Total pack years: 25.00    Types: Cigarettes   Smokeless tobacco: Never   Tobacco comments:    Per pt. He smokes 1 pack every 3 days  Vaping Use   Vaping Use: Never used  Substance and Sexual Activity   Alcohol use: No   Drug use: Not Currently    Types: Marijuana    Sexual activity: Not Currently  Other Topics Concern   Not on file  Social History Narrative   Not on file   Social Determinants of Health   Financial Resource Strain: Not on file  Food Insecurity: No Food Insecurity (07/30/2022)   Hunger Vital Sign    Worried About Running Out of Food in the Last Year: Never true    Ran Out of Food in the Last Year: Never true  Transportation Needs: No Transportation Needs (07/30/2022)   PRAPARE - Hydrologist (Medical): No    Lack of Transportation (Non-Medical): No  Physical Activity: Not on file  Stress: Not on file  Social Connections: Not on file  Intimate Partner Violence: Not At Risk (07/30/2022)   Humiliation, Afraid, Rape, and Kick questionnaire    Fear of Current or Ex-Partner: No    Emotionally Abused: No    Physically Abused: No    Sexually Abused: No    History reviewed. No pertinent family history.  No Known Allergies  Review of Systems  Respiratory:  Positive for cough and shortness of breath.   All other systems reviewed and are negative.      Objective:   BP 130/80  Pulse 72   Ht 5\' 11"  (1.803 m)   Wt 193 lb 3.2 oz (87.6 kg)   SpO2 91%   BMI 26.95 kg/m   Vitals:   10/11/22 1505  BP: 130/80  Pulse: 72  Height: 5\' 11"  (1.803 m)  Weight: 193 lb 3.2 oz (87.6 kg)  SpO2: 91%  BMI (Calculated): 26.96    Physical Exam Vitals and nursing note reviewed.  Constitutional:      General: He is not in acute distress.    Appearance: Normal appearance. He is normal weight.  HENT:     Head: Normocephalic and atraumatic.  Eyes:     Conjunctiva/sclera: Conjunctivae normal.     Pupils: Pupils are equal, round, and reactive to light.  Cardiovascular:     Rate and Rhythm: Normal rate and regular rhythm.     Pulses: Normal pulses.     Heart sounds: Normal heart sounds.  Pulmonary:     Effort: Pulmonary effort is normal.     Breath sounds: Normal breath sounds.  Musculoskeletal:         General: Normal range of motion.  Neurological:     General: No focal deficit present.     Mental Status: He is alert and oriented to person, place, and time.     Gait: Gait abnormal (shuffling).  Psychiatric:        Mood and Affect: Mood normal.        Behavior: Behavior normal.        Thought Content: Thought content normal.        Judgment: Judgment normal.      No results found for any visits on 10/11/22.  Recent Results (from the past 2160 hour(s))  Procalcitonin     Status: None   Collection Time: 07/29/22  4:36 AM  Result Value Ref Range   Procalcitonin 0.39 ng/mL    Comment:        Interpretation: PCT (Procalcitonin) <= 0.5 ng/mL: Systemic infection (sepsis) is not likely. Local bacterial infection is possible. (NOTE)       Sepsis PCT Algorithm           Lower Respiratory Tract                                      Infection PCT Algorithm    ----------------------------     ----------------------------         PCT < 0.25 ng/mL                PCT < 0.10 ng/mL          Strongly encourage             Strongly discourage   discontinuation of antibiotics    initiation of antibiotics    ----------------------------     -----------------------------       PCT 0.25 - 0.50 ng/mL            PCT 0.10 - 0.25 ng/mL               OR       >80% decrease in PCT            Discourage initiation of  antibiotics      Encourage discontinuation           of antibiotics    ----------------------------     -----------------------------         PCT >= 0.50 ng/mL              PCT 0.26 - 0.50 ng/mL               AND        <80% decrease in PCT             Encourage initiation of                                             antibiotics       Encourage continuation           of antibiotics    ----------------------------     -----------------------------        PCT >= 0.50 ng/mL                  PCT > 0.50 ng/mL               AND         increase  in PCT                  Strongly encourage                                      initiation of antibiotics    Strongly encourage escalation           of antibiotics                                     -----------------------------                                           PCT <= 0.25 ng/mL                                                 OR                                        > 80% decrease in PCT                                      Discontinue / Do not initiate                                             antibiotics  Performed at [facility], [address] [place], [place] [address]   Resp panel by RT-PCR (RSV, Flu A&B, Covid) Anterior Nasal Swab  Status: Abnormal   Collection Time: 07/29/22  6:08 PM   Specimen: Anterior Nasal Swab  Result Value Ref Range   SARS Coronavirus 2 by RT PCR NEGATIVE NEGATIVE    Comment: (NOTE) SARS-CoV-2 target nucleic acids are NOT DETECTED.  The SARS-CoV-2 RNA is generally detectable in upper respiratory specimens during the acute phase of infection. The lowest concentration of SARS-CoV-2 viral copies this assay can detect is 138 copies/mL. A negative result does not preclude SARS-Cov-2 infection and should not be used as the sole basis for treatment or other patient management decisions. A negative result may occur with  improper specimen collection/handling, submission of specimen other than nasopharyngeal swab, presence of viral mutation(s) within the areas targeted by this assay, and inadequate number of viral copies(<138 copies/mL). A negative result must be combined with clinical observations, patient history, and epidemiological information. The expected result is Negative.  Fact Sheet for Patients:  EntrepreneurPulse.com.au  Fact Sheet for Healthcare Providers:  IncredibleEmployment.be  This test is no t yet approved or cleared by the [place] FDA and  has been authorized for  detection and/or diagnosis of SARS-CoV-2 by FDA under an Emergency Use Authorization (EUA). This EUA will remain  in effect (meaning this test can be used) for the duration of the COVID-19 declaration under Section 564(b)(1) of the Act, 21 U.S.C.section 360bbb-3(b)(1), unless the authorization is terminated  or revoked sooner.       Influenza A by PCR POSITIVE (A) NEGATIVE   Influenza B by PCR NEGATIVE NEGATIVE    Comment: (NOTE) The Xpert Xpress SARS-CoV-2/FLU/RSV plus assay is intended as an aid in the diagnosis of influenza from Nasopharyngeal swab specimens and should not be used as a sole basis for treatment. Nasal washings and aspirates are unacceptable for Xpert Xpress SARS-CoV-2/FLU/RSV testing.  Fact Sheet for Patients: EntrepreneurPulse.com.au  Fact Sheet for Healthcare Providers: IncredibleEmployment.be  This test is not yet approved or cleared by the [place] FDA and has been authorized for detection and/or diagnosis of SARS-CoV-2 by FDA under an Emergency Use Authorization (EUA). This EUA will remain in effect (meaning this test can be used) for the duration of the COVID-19 declaration under Section 564(b)(1) of the Act, 21 U.S.C. section 360bbb-3(b)(1), unless the authorization is terminated or revoked.     Resp Syncytial Virus by PCR NEGATIVE NEGATIVE    Comment: (NOTE) Fact Sheet for Patients: EntrepreneurPulse.com.au  Fact Sheet for Healthcare Providers: IncredibleEmployment.be  This test is not yet approved or cleared by the [place] FDA and has been authorized for detection and/or diagnosis of SARS-CoV-2 by FDA under an Emergency Use Authorization (EUA). This EUA will remain in effect (meaning this test can be used) for the duration of the COVID-19 declaration under Section 564(b)(1) of the Act, 21 U.S.C. section 360bbb-3(b)(1), unless the authorization is terminated  or revoked.  Performed at [facility], [address] [place], [place] [address]   Comprehensive metabolic panel     Status: Abnormal   Collection Time: 07/29/22  6:10 PM  Result Value Ref Range   Sodium 140 135 - 145 mmol/L   Potassium 3.8 3.5 - 5.1 mmol/L   Chloride 105 98 - 111 mmol/L   CO2 24 22 - 32 mmol/L   Glucose, Bld 117 (H) 70 - 99 mg/dL    Comment: Glucose reference range applies only to samples taken after fasting for at least 8 hours.   BUN 18 8 - 23 mg/dL   Creatinine, Ser 1.78 (H) 0.61 -  1.24 mg/dL   Calcium 8.7 (L) 8.9 - 10.3 mg/dL   Total Protein 7.5 6.5 - 8.1 g/dL   Albumin 3.7 3.5 - 5.0 g/dL   AST 23 15 - 41 U/L   ALT 19 0 - 44 U/L   Alkaline Phosphatase 57 38 - 126 U/L   Total Bilirubin 0.9 0.3 - 1.2 mg/dL   GFR, Estimated 40 (L) >60 mL/min    Comment: (NOTE) Calculated using the CKD-EPI Creatinine Equation (2021)    Anion gap 11 5 - 15    Comment: Performed at [facility], [address] [place], [place] [address]  CBC with Differential     Status: Abnormal   Collection Time: 07/29/22  6:10 PM  Result Value Ref Range   WBC 4.2 4.0 - 10.5 K/uL    Comment: WHITE COUNT CONFIRMED ON SMEAR   RBC 5.20 4.22 - 5.81 MIL/uL   Hemoglobin 16.6 13.0 - 17.0 g/dL   HCT 50.3 39.0 - 52.0 %   MCV 96.7 80.0 - 100.0 fL   MCH 31.9 26.0 - 34.0 pg   MCHC 33.0 30.0 - 36.0 g/dL   RDW 12.7 11.5 - 15.5 %   Platelets 120 (L) 150 - 400 K/uL    Comment: PLATELET COUNT CONFIRMED BY SMEAR   nRBC 0.0 0.0 - 0.2 %   Neutrophils Relative % 76 %   Neutro Abs 3.2 1.7 - 7.7 K/uL   Lymphocytes Relative 7 %   Lymphs Abs 0.3 (L) 0.7 - 4.0 K/uL   Monocytes Relative 15 %   Monocytes Absolute 0.6 0.1 - 1.0 K/uL   Eosinophils Relative 1 %   Eosinophils Absolute 0.0 0.0 - 0.5 K/uL   Basophils Relative 1 %   Basophils Absolute 0.0 0.0 - 0.1 K/uL   WBC Morphology MORPHOLOGY UNREMARKABLE    RBC Morphology MORPHOLOGY UNREMARKABLE    Smear Review Normal platelet  morphology    Immature Granulocytes 0 %   Abs Immature Granulocytes 0.01 0.00 - 0.07 K/uL    Comment: Performed at [facility], [address] [place], [place] [address]  Protime-INR     Status: None   Collection Time: 07/29/22  6:10 PM  Result Value Ref Range   Prothrombin Time 15.0 11.4 - 15.2 seconds   INR 1.2 0.8 - 1.2    Comment: (NOTE) INR goal varies based on device and disease states. Performed at [facility], [address] [place], [place] [address]   APTT     Status: Abnormal   Collection Time: 07/29/22  6:10 PM  Result Value Ref Range   aPTT 41 (H) 24 - 36 seconds    Comment:        IF BASELINE aPTT IS ELEVATED, SUGGEST PATIENT RISK ASSESSMENT BE USED TO DETERMINE APPROPRIATE ANTICOAGULANT THERAPY. Performed at [facility], [address] [place], [place] [address]   Blood Culture (routine x 2)     Status: None   Collection Time: 07/29/22  6:10 PM   Specimen: BLOOD  Result Value Ref Range   Specimen Description BLOOD BLOOD RIGHT ARM    Special Requests      BOTTLES DRAWN AEROBIC AND ANAEROBIC Blood Culture adequate volume   Culture      NO GROWTH 5 DAYS Performed at [facility], [address], [place], [place] [address]    Report Status 08/03/2022 FINAL   Lactic acid, plasma     Status: None   Collection Time: 07/29/22  6:11  PM  Result Value Ref Range   Lactic Acid, Venous 1.2 0.5 - 1.9 mmol/L    Comment: Performed at [facility], [address] [place], [place] [address]  Blood Culture (routine x 2)     Status: None   Collection Time: 07/29/22  6:11 PM   Specimen: BLOOD  Result Value Ref Range   Specimen Description BLOOD BLOOD LEFT ARM    Special Requests BOTTLES DRAWN AEROBIC AND ANAEROBIC BCAV    Culture      NO GROWTH 5 DAYS Performed at [facility], [address] [place], [place] [address]    Report Status 08/03/2022 FINAL   Urinalysis, Complete w Microscopic Urine,  Suprapubic     Status: Abnormal   Collection Time: 07/29/22  6:30 PM  Result Value Ref Range   Color, Urine YELLOW (A) YELLOW   APPearance CLOUDY (A) CLEAR   Specific Gravity, Urine 1.013 1.005 - 1.030   pH 7.0 5.0 - 8.0   Glucose, UA NEGATIVE NEGATIVE mg/dL   Hgb urine dipstick NEGATIVE NEGATIVE   Bilirubin Urine NEGATIVE NEGATIVE   Ketones, ur NEGATIVE NEGATIVE mg/dL   Protein, ur 30 (A) NEGATIVE mg/dL   Nitrite NEGATIVE NEGATIVE   Leukocytes,Ua NEGATIVE NEGATIVE   RBC / HPF 0-5 0 - 5 RBC/hpf   WBC, UA 11-20 0 - 5 WBC/hpf   Bacteria, UA FEW (A) NONE SEEN   Squamous Epithelial / HPF 0-5 0 - 5 /HPF   Mucus PRESENT    Amorphous Crystal PRESENT     Comment: Performed at [facility], [address] [place], [place] [address]  Urine Culture     Status: Abnormal   Collection Time: 07/29/22  6:30 PM   Specimen: Urine, Random  Result Value Ref Range   Specimen Description      URINE, RANDOM Performed at [facility], [address], [place], [place] [address]    Special Requests      NONE Performed at [facility], [address], [place], [place] [address]    Culture MULTIPLE SPECIES PRESENT, SUGGEST RECOLLECTION (A)    Report Status 07/30/2022 FINAL   Lactic acid, plasma     Status: Abnormal   Collection Time: 07/29/22  7:35 PM  Result Value Ref Range   Lactic Acid, Venous 2.1 (HH) 0.5 - 1.9 mmol/L    Comment: CRITICAL RESULT CALLED TO, READ BACK BY AND VERIFIED WITH JEREMIE [facility] AT 2017 ON 07/29/22 BY SS Performed at [facility], [place],  [address]   Troponin I (High Sensitivity)     Status: Abnormal   Collection Time: 07/29/22  8:35 PM  Result Value Ref Range   Troponin I (High Sensitivity) 30 (H) <18 ng/L    Comment: (NOTE) Elevated high sensitivity troponin I (hsTnI) values and significant  changes across serial measurements may suggest ACS but many other  chronic and acute conditions are known to  elevate hsTnI results.  Refer to the "Links" section for chest pain algorithms and additional  guidance. Performed at [facility], [address] [place],  [address]   Brain natriuretic peptide     Status: Abnormal   Collection Time: 07/29/22  8:35 PM  Result Value Ref Range   B Natriuretic Peptide 121.6 (H) 0.0 - 100.0 pg/mL    Comment: Performed at [facility], [place], [place] [address]  Lactic acid, plasma     Status: Abnormal   Collection  Time: 07/30/22  1:44 AM  Result Value Ref Range   Lactic Acid, Venous 2.0 (HH) 0.5 - 1.9 mmol/L    Comment: CRITICAL VALUE NOTED. VALUE IS CONSISTENT WITH PREVIOUSLY REPORTED/CALLED VALUE RH Performed at [facility], [address] [place], [place] [id number]   Basic metabolic panel     Status: Abnormal   Collection Time: 07/30/22  4:36 AM  Result Value Ref Range   Sodium 138 135 - 145 mmol/L   Potassium 3.3 (L) 3.5 - 5.1 mmol/L   Chloride 102 98 - 111 mmol/L   CO2 26 22 - 32 mmol/L   Glucose, Bld 115 (H) 70 - 99 mg/dL    Comment: Glucose reference range applies only to samples taken after fasting for at least 8 hours.   BUN 21 8 - 23 mg/dL   Creatinine, Ser 1.89 (H) 0.61 - 1.24 mg/dL   Calcium 7.9 (L) 8.9 - 10.3 mg/dL   GFR, Estimated 37 (L) >60 mL/min    Comment: (NOTE) Calculated using the CKD-EPI Creatinine Equation (2021)    Anion gap 10 5 - 15    Comment: Performed at [facility], [address] [place], [place] [address]  Cortisol-am, blood     Status: None   Collection Time: 07/30/22  4:36 AM  Result Value Ref Range   Cortisol - AM 16.6 6.7 - 22.6 ug/dL    Comment: Performed at [facility] [address], [place], [place] [address]  CBC     Status: Abnormal   Collection Time: 07/30/22  4:36 AM  Result Value Ref Range   WBC 4.0 4.0 - 10.5 K/uL    Comment: WHITE COUNT CONFIRMED ON SMEAR   RBC 5.07 4.22 - 5.81 MIL/uL   Hemoglobin 16.2 13.0 - 17.0  g/dL   HCT 49.5 39.0 - 52.0 %   MCV 97.6 80.0 - 100.0 fL   MCH 32.0 26.0 - 34.0 pg   MCHC 32.7 30.0 - 36.0 g/dL   RDW 13.1 11.5 - 15.5 %   Platelets 112 (L) 150 - 400 K/uL    Comment: PLATELET COUNT CONFIRMED BY SMEAR   nRBC 0.0 0.0 - 0.2 %    Comment: Performed at [facility], [address] [place], [place] [address]  Lactic acid, plasma     Status: None   Collection Time: 07/30/22  4:36 AM  Result Value Ref Range   Lactic Acid, Venous 1.5 0.5 - 1.9 mmol/L    Comment: Performed at [facility], [address] [place], [place] [address]  ECHOCARDIOGRAM COMPLETE     Status: None   Collection Time: 07/30/22  1:42 PM  Result Value Ref Range   Weight 3,360 oz   Height 71 in   BP 179/99 mmHg   Ao pk vel 1.24 m/s   AV Area VTI 2.54 cm2   AR max vel 2.71 cm2   AV Mean grad 4.0 mmHg   AV Peak grad 6.2 mmHg   S' Lateral 3.10 cm   AV Area mean vel 2.38 cm2   Area-P 1/2 3.27 cm2   MV VTI 3.46 cm2  Basic metabolic panel     Status: Abnormal   Collection Time: 07/31/22  4:40 AM  Result Value Ref Range   Sodium 137 135 - 145 mmol/L   Potassium 3.7 3.5 - 5.1 mmol/L   Chloride 101 98 - 111 mmol/L   CO2 26 22 - 32 mmol/L   Glucose, Bld 92 70 -  99 mg/dL    Comment: Glucose reference range applies only to samples taken after fasting for at least 8 hours.   BUN 27 (H) 8 - 23 mg/dL   Creatinine, Ser 1.78 (H) 0.61 - 1.24 mg/dL   Calcium 8.3 (L) 8.9 - 10.3 mg/dL   GFR, Estimated 40 (L) >60 mL/min    Comment: (NOTE) Calculated using the CKD-EPI Creatinine Equation (2021)    Anion gap 10 5 - 15    Comment: Performed at [facility], [address] [place], [place] [address]  CBC     Status: Abnormal   Collection Time: 07/31/22  4:40 AM  Result Value Ref Range   WBC 3.0 (L) 4.0 - 10.5 K/uL   RBC 5.15 4.22 - 5.81 MIL/uL   Hemoglobin 16.2 13.0 - 17.0 g/dL   HCT 49.6 39.0 - 52.0 %   MCV 96.3 80.0 - 100.0 fL   MCH 31.5 26.0 - 34.0 pg   MCHC 32.7 30.0 -  36.0 g/dL   RDW 13.0 11.5 - 15.5 %   Platelets 118 (L) 150 - 400 K/uL   nRBC 0.0 0.0 - 0.2 %    Comment: Performed at [facility], [address] [place], [place] [address]  D-dimer, quantitative     Status: Abnormal   Collection Time: 07/31/22  4:40 AM  Result Value Ref Range   D-Dimer, Quant 1.31 (H) 0.00 - 0.50 ug/mL-FEU    Comment: (NOTE) At the manufacturer cut-off value of 0.5 g/mL FEU, this assay has a negative predictive value of 95-100%.This assay is intended for use in conjunction with a clinical pretest probability (PTP) assessment model to exclude pulmonary embolism (PE) and deep venous thrombosis (DVT) in outpatients suspected of PE or DVT. Results should be correlated with clinical presentation. Performed at [facility], [address] [place], [place] [address]   CBC     Status: Abnormal   Collection Time: 08/01/22  4:32 AM  Result Value Ref Range   WBC 2.2 (L) 4.0 - 10.5 K/uL   RBC 5.03 4.22 - 5.81 MIL/uL   Hemoglobin 15.8 13.0 - 17.0 g/dL   HCT 47.8 39.0 - 52.0 %   MCV 95.0 80.0 - 100.0 fL   MCH 31.4 26.0 - 34.0 pg   MCHC 33.1 30.0 - 36.0 g/dL   RDW 13.1 11.5 - 15.5 %   Platelets 131 (L) 150 - 400 K/uL   nRBC 0.0 0.0 - 0.2 %    Comment: Performed at [facility], [address] [place], [place] [address]  CBC     Status: Abnormal   Collection Time: 08/02/22  3:51 AM  Result Value Ref Range   WBC 2.3 (L) 4.0 - 10.5 K/uL   RBC 4.99 4.22 - 5.81 MIL/uL   Hemoglobin 15.7 13.0 - 17.0 g/dL   HCT 48.0 39.0 - 52.0 %   MCV 96.2 80.0 - 100.0 fL   MCH 31.5 26.0 - 34.0 pg   MCHC 32.7 30.0 - 36.0 g/dL   RDW 13.0 11.5 - 15.5 %   Platelets 130 (L) 150 - 400 K/uL   nRBC 0.0 0.0 - 0.2 %    Comment: Performed at [facility], [address] [place], [place] [address]  Comprehensive metabolic panel     Status: Abnormal   Collection Time: 09/13/22 12:55 PM  Result Value Ref Range   Sodium 137 135 - 145 mmol/L   Potassium  3.6 3.5 - 5.1 mmol/L  Chloride 106 98 - 111 mmol/L   CO2 21 (L) 22 - 32 mmol/L   Glucose, Bld 102 (H) 70 - 99 mg/dL    Comment: Glucose reference range applies only to samples taken after fasting for at least 8 hours.   BUN 32 (H) 8 - 23 mg/dL   Creatinine, Ser 2.08 (H) 0.61 - 1.24 mg/dL   Calcium 8.7 (L) 8.9 - 10.3 mg/dL   Total Protein 7.4 6.5 - 8.1 g/dL   Albumin 3.3 (L) 3.5 - 5.0 g/dL   AST 20 15 - 41 U/L   ALT 29 0 - 44 U/L   Alkaline Phosphatase 58 38 - 126 U/L   Total Bilirubin 0.7 0.3 - 1.2 mg/dL   GFR, Estimated 33 (L) >60 mL/min    Comment: (NOTE) Calculated using the CKD-EPI Creatinine Equation (2021)    Anion gap 10 5 - 15    Comment: Performed at [facility], [address] [place], [place] [address]  CBC with Differential/Platelet     Status: None   Collection Time: 09/13/22 12:55 PM  Result Value Ref Range   WBC 6.3 4.0 - 10.5 K/uL   RBC 5.05 4.22 - 5.81 MIL/uL   Hemoglobin 15.8 13.0 - 17.0 g/dL   HCT 48.5 39.0 - 52.0 %   MCV 96.0 80.0 - 100.0 fL   MCH 31.3 26.0 - 34.0 pg   MCHC 32.6 30.0 - 36.0 g/dL   RDW 13.2 11.5 - 15.5 %   Platelets 181 150 - 400 K/uL   nRBC 0.0 0.0 - 0.2 %   Neutrophils Relative % 70 %   Neutro Abs 4.4 1.7 - 7.7 K/uL   Lymphocytes Relative 14 %   Lymphs Abs 0.9 0.7 - 4.0 K/uL   Monocytes Relative 13 %   Monocytes Absolute 0.8 0.1 - 1.0 K/uL   Eosinophils Relative 2 %   Eosinophils Absolute 0.1 0.0 - 0.5 K/uL   Basophils Relative 1 %   Basophils Absolute 0.0 0.0 - 0.1 K/uL   Immature Granulocytes 0 %   Abs Immature Granulocytes 0.02 0.00 - 0.07 K/uL    Comment: Performed at [facility], [address] [place], [place] [address]      Assessment & Plan:   Problem List Items Addressed This Visit     Essential hypertension   Relevant Orders   CBC With Differential   CMP14+EGFR   Hyperlipidemia   Relevant Orders   Lipid panel   CBC With Differential   CMP14+EGFR   Prediabetes   Relevant Orders   CBC With  Differential   CMP14+EGFR   Stage 3b chronic kidney disease (CKD) (HCC) - Primary   Relevant Orders   CBC With Differential   CMP14+EGFR   COPD suggested by initial evaluation ([place])   Disorder of adrenal gland, unspecified ([place])   Other Visit Diagnoses     B12 deficiency due to diet       Relevant Orders   CBC With Differential   CMP14+EGFR   Vitamin B12   Type 2 diabetes mellitus with hyperglycemia, without long-term current use of insulin (HCC)       Relevant Orders   CBC With Differential   CMP14+EGFR   Hemoglobin A1c   Other fatigue       Relevant Orders   CBC With Differential   CMP14+EGFR   TSH   Vitamin D deficiency, unspecified       Relevant Orders   VITAMIN D 25 Hydroxy (Vit-D  Deficiency, Fractures)   CBC With Differential   CMP14+EGFR       No follow-ups on file.   Total time spent: 30 minutes  Mechele Claude, FNP  10/11/2022

## 2022-10-12 LAB — CMP14+EGFR
ALT: 8 IU/L (ref 0–44)
AST: 11 IU/L (ref 0–40)
Albumin/Globulin Ratio: 1.4 (ref 1.2–2.2)
Albumin: 4.2 g/dL (ref 3.8–4.8)
Alkaline Phosphatase: 87 IU/L (ref 44–121)
BUN/Creatinine Ratio: 15 (ref 10–24)
BUN: 27 mg/dL (ref 8–27)
Bilirubin Total: 0.3 mg/dL (ref 0.0–1.2)
CO2: 20 mmol/L (ref 20–29)
Calcium: 10.2 mg/dL (ref 8.6–10.2)
Chloride: 105 mmol/L (ref 96–106)
Creatinine, Ser: 1.85 mg/dL — ABNORMAL HIGH (ref 0.76–1.27)
Globulin, Total: 2.9 g/dL (ref 1.5–4.5)
Glucose: 100 mg/dL — ABNORMAL HIGH (ref 70–99)
Potassium: 4.3 mmol/L (ref 3.5–5.2)
Sodium: 141 mmol/L (ref 134–144)
Total Protein: 7.1 g/dL (ref 6.0–8.5)
eGFR: 38 mL/min/{1.73_m2} — ABNORMAL LOW (ref >59)

## 2022-10-12 LAB — LIPID PANEL
Chol/HDL Ratio: 2.7 ratio (ref 0.0–5.0)
Cholesterol, Total: 132 mg/dL (ref 100–199)
HDL: 49 mg/dL (ref >39)
LDL Chol Calc (NIH): 71 mg/dL (ref 0–99)
Triglycerides: 58 mg/dL (ref 0–149)
VLDL Cholesterol Cal: 12 mg/dL (ref 5–40)

## 2022-10-12 LAB — CBC WITH DIFFERENTIAL
Basophils Absolute: 0 10*3/uL (ref 0.0–0.2)
Basos: 0 %
EOS (ABSOLUTE): 0.3 10*3/uL (ref 0.0–0.4)
Eos: 4 %
Hematocrit: 46.3 % (ref 37.5–51.0)
Hemoglobin: 15.6 g/dL (ref 13.0–17.7)
Immature Grans (Abs): 0 10*3/uL (ref 0.0–0.1)
Immature Granulocytes: 0 %
Lymphocytes Absolute: 1.1 10*3/uL (ref 0.7–3.1)
Lymphs: 16 %
MCH: 30.9 pg (ref 26.6–33.0)
MCHC: 33.7 g/dL (ref 31.5–35.7)
MCV: 92 fL (ref 79–97)
Monocytes Absolute: 0.6 10*3/uL (ref 0.1–0.9)
Monocytes: 8 %
Neutrophils Absolute: 5 10*3/uL (ref 1.4–7.0)
Neutrophils: 72 %
RBC: 5.05 x10E6/uL (ref 4.14–5.80)
RDW: 12.6 % (ref 11.6–15.4)
WBC: 7 10*3/uL (ref 3.4–10.8)

## 2022-10-12 LAB — HEMOGLOBIN A1C
Est. average glucose Bld gHb Est-mCnc: 143 mg/dL
Hgb A1c MFr Bld: 6.6 % — ABNORMAL HIGH (ref 4.8–5.6)

## 2022-10-12 LAB — TSH: TSH: 1.57 u[IU]/mL (ref 0.450–4.500)

## 2022-10-12 LAB — VITAMIN B12: Vitamin B-12: 394 pg/mL (ref 232–1245)

## 2022-10-12 LAB — VITAMIN D 25 HYDROXY (VIT D DEFICIENCY, FRACTURES): Vit D, 25-Hydroxy: 33.1 ng/mL (ref 30.0–100.0)

## 2022-11-01 DIAGNOSIS — J9600 Acute respiratory failure, unspecified whether with hypoxia or hypercapnia: Secondary | ICD-10-CM | POA: Diagnosis not present

## 2022-11-04 ENCOUNTER — Telehealth: Payer: Self-pay | Admitting: Oncology

## 2022-11-04 NOTE — Telephone Encounter (Signed)
Pt called to schedule a port flush since he has not had one in a while. He stated he would like to get it removed. I have scheduled in Monday 4/15 for a flush. He would like a call back to discuss when he could get it removed

## 2022-11-05 ENCOUNTER — Other Ambulatory Visit: Payer: Self-pay | Admitting: *Deleted

## 2022-11-05 ENCOUNTER — Telehealth (INDEPENDENT_AMBULATORY_CARE_PROVIDER_SITE_OTHER): Payer: Self-pay

## 2022-11-05 NOTE — Telephone Encounter (Signed)
Yes ok 

## 2022-11-05 NOTE — Telephone Encounter (Signed)
Spoke with the patient and he is scheduled with Dr. Wyn Quaker on 11/15/22 for a port removal on 11/15/22 with a 9:15 am arrival time to the [facility]. Pre-procedure instructions were discussed and will be mailed.

## 2022-11-08 ENCOUNTER — Inpatient Hospital Stay: Payer: Medicare Other | Attending: Oncology

## 2022-11-08 DIAGNOSIS — Z452 Encounter for adjustment and management of vascular access device: Secondary | ICD-10-CM | POA: Insufficient documentation

## 2022-11-08 DIAGNOSIS — C3411 Malignant neoplasm of upper lobe, right bronchus or lung: Secondary | ICD-10-CM | POA: Insufficient documentation

## 2022-11-08 DIAGNOSIS — Z95828 Presence of other vascular implants and grafts: Secondary | ICD-10-CM

## 2022-11-08 MED ORDER — HEPARIN SOD (PORK) LOCK FLUSH 100 UNIT/ML IV SOLN
500.0000 [IU] | Freq: Once | INTRAVENOUS | Status: AC
  Administered 2022-11-08: 500 [IU] via INTRAVENOUS
  Filled 2022-11-08: qty 5

## 2022-11-08 MED ORDER — SODIUM CHLORIDE 0.9% FLUSH
10.0000 mL | Freq: Once | INTRAVENOUS | Status: AC
  Administered 2022-11-08: 10 mL via INTRAVENOUS
  Filled 2022-11-08: qty 10

## 2022-11-10 ENCOUNTER — Telehealth: Payer: Self-pay | Admitting: *Deleted

## 2022-11-10 NOTE — Telephone Encounter (Signed)
Called and pt answered and I told him I have been trying to get in touch with him about the port removal. He states that he got a call from vascular and gave him an appt to have the port removed.

## 2022-11-15 ENCOUNTER — Ambulatory Visit
Admission: RE | Admit: 2022-11-15 | Discharge: 2022-11-15 | Disposition: A | Discharge status: Home / Self Care | Payer: Medicare Other | Attending: Vascular Surgery | Admitting: Vascular Surgery

## 2022-11-15 ENCOUNTER — Encounter: Payer: Self-pay | Admitting: Vascular Surgery

## 2022-11-15 ENCOUNTER — Encounter
Admission: RE | Disposition: A | Discharge status: Home / Self Care | Payer: Self-pay | Source: Home / Self Care | Attending: Vascular Surgery

## 2022-11-15 DIAGNOSIS — Z8551 Personal history of malignant neoplasm of bladder: Secondary | ICD-10-CM | POA: Diagnosis not present

## 2022-11-15 DIAGNOSIS — F17210 Nicotine dependence, cigarettes, uncomplicated: Secondary | ICD-10-CM | POA: Insufficient documentation

## 2022-11-15 DIAGNOSIS — I70209 Unspecified atherosclerosis of native arteries of extremities, unspecified extremity: Secondary | ICD-10-CM

## 2022-11-15 DIAGNOSIS — I129 Hypertensive chronic kidney disease with stage 1 through stage 4 chronic kidney disease, or unspecified chronic kidney disease: Secondary | ICD-10-CM | POA: Diagnosis not present

## 2022-11-15 DIAGNOSIS — N1832 Chronic kidney disease, stage 3b: Secondary | ICD-10-CM | POA: Diagnosis not present

## 2022-11-15 DIAGNOSIS — C3490 Malignant neoplasm of unspecified part of unspecified bronchus or lung: Secondary | ICD-10-CM | POA: Diagnosis not present

## 2022-11-15 HISTORY — PX: PORTA CATH REMOVAL: CATH118286

## 2022-11-15 SURGERY — PORTA CATH REMOVAL
Anesthesia: Moderate Sedation

## 2022-11-15 MED ORDER — CEFAZOLIN SODIUM-DEXTROSE 2-4 GM/100ML-% IV SOLN
2.0000 g | INTRAVENOUS | Status: AC
  Administered 2022-11-15: 2 g via INTRAVENOUS

## 2022-11-15 MED ORDER — HYDROMORPHONE HCL 1 MG/ML IJ SOLN: 1.0000 mg | Freq: Once | INTRAMUSCULAR | Status: DC | PRN

## 2022-11-15 MED ORDER — MIDAZOLAM HCL 2 MG/2ML IJ SOLN
INTRAMUSCULAR | Status: DC | PRN
  Administered 2022-11-15: 1 mg via INTRAVENOUS

## 2022-11-15 MED ORDER — HEPARIN SODIUM (PORCINE) 1000 UNIT/ML IJ SOLN
INTRAMUSCULAR | Status: AC
  Filled 2022-11-15: qty 10

## 2022-11-15 MED ORDER — CEFAZOLIN SODIUM-DEXTROSE 2-4 GM/100ML-% IV SOLN
INTRAVENOUS | Status: AC
  Filled 2022-11-15: qty 100

## 2022-11-15 MED ORDER — MIDAZOLAM HCL 2 MG/2ML IJ SOLN
INTRAMUSCULAR | Status: AC
  Filled 2022-11-15: qty 2

## 2022-11-15 MED ORDER — FENTANYL CITRATE (PF) 100 MCG/2ML IJ SOLN
INTRAMUSCULAR | Status: DC | PRN
  Administered 2022-11-15: 50 ug via INTRAVENOUS

## 2022-11-15 MED ORDER — MIDAZOLAM HCL 2 MG/ML PO SYRP: 8.0000 mg | ORAL_SOLUTION | Freq: Once | ORAL | Status: DC | PRN

## 2022-11-15 MED ORDER — FENTANYL CITRATE (PF) 100 MCG/2ML IJ SOLN
INTRAMUSCULAR | Status: AC
  Filled 2022-11-15: qty 2

## 2022-11-15 MED ORDER — ONDANSETRON HCL 4 MG/2ML IJ SOLN: 4.0000 mg | Freq: Four times a day (QID) | INTRAMUSCULAR | Status: DC | PRN

## 2022-11-15 MED ORDER — DIPHENHYDRAMINE HCL 50 MG/ML IJ SOLN: 50.0000 mg | Freq: Once | INTRAMUSCULAR | Status: DC | PRN

## 2022-11-15 MED ORDER — FAMOTIDINE 20 MG PO TABS: 40.0000 mg | ORAL_TABLET | Freq: Once | ORAL | Status: DC | PRN

## 2022-11-15 MED ORDER — SODIUM CHLORIDE 0.9 % IV SOLN
INTRAVENOUS | Status: DC
  Administered 2022-11-15: 1000 mL via INTRAVENOUS

## 2022-11-15 MED ORDER — METHYLPREDNISOLONE SODIUM SUCC 125 MG IJ SOLR: 125.0000 mg | Freq: Once | INTRAMUSCULAR | Status: DC | PRN

## 2022-11-15 SURGICAL SUPPLY — 9 items
ADH SKN CLS APL DERMABOND .7 (GAUZE/BANDAGES/DRESSINGS) ×1
DERMABOND ADVANCED .7 DNX12 (GAUZE/BANDAGES/DRESSINGS) IMPLANT
ELECT REM PT RETURN 9FT ADLT (ELECTROSURGICAL) ×1
ELECTRODE REM PT RTRN 9FT ADLT (ELECTROSURGICAL) IMPLANT
PACK ANGIOGRAPHY (CUSTOM PROCEDURE TRAY) IMPLANT
PENCIL ELECTRO HAND CTR (MISCELLANEOUS) IMPLANT
SUT MNCRL AB 4-0 PS2 18 (SUTURE) IMPLANT
SUT VIC AB 3-0 SH 27 (SUTURE) ×1
SUT VIC AB 3-0 SH 27X BRD (SUTURE) IMPLANT

## 2022-11-15 NOTE — H&P (Signed)
[facility] Admission History & Physical  MRN : [id number]  Martin Jennings is a 72 y.o. (06/02/1951) male who presents with chief complaint of No chief complaint on file. Marland Kitchen  History of Present Illness: Patient presents with desire to have his port removed.  Has a right jugular port placed about 3 years ago.  Has treatment for lung cancer and no longer needs the port.  It is mildly bothersome to him.  No fevers or chills.  Current Facility-Administered Medications  Medication Dose Route Frequency Provider Last Rate Last Admin   0.9 %  sodium chloride infusion   Intravenous Continuous Georgiana Spinner, NP       ceFAZolin (ANCEF) IVPB 2g/100 mL premix  2 g Intravenous 30 min Pre-Op Georgiana Spinner, NP       diphenhydrAMINE (BENADRYL) injection 50 mg  50 mg Intravenous Once PRN Georgiana Spinner, NP       famotidine (PEPCID) tablet 40 mg  40 mg Oral Once PRN Georgiana Spinner, NP       HYDROmorphone (DILAUDID) injection 1 mg  1 mg Intravenous Once PRN Georgiana Spinner, NP       methylPREDNISolone sodium succinate (SOLU-MEDROL) 125 mg/2 mL injection 125 mg  125 mg Intravenous Once PRN Georgiana Spinner, NP       midazolam (VERSED) 2 MG/ML syrup 8 mg  8 mg Oral Once PRN Georgiana Spinner, NP       ondansetron [facility]) injection 4 mg  4 mg Intravenous Q6H PRN Georgiana Spinner, NP       Facility-Administered Medications Ordered in Other Encounters  Medication Dose Route Frequency Provider Last Rate Last Admin   sodium chloride flush (NS) 0.9 % injection 10 mL  10 mL Intravenous PRN Creig Hines, MD   10 mL at 09/18/20 0850    Past Medical History:  Diagnosis Date   Bladder cancer 2012   COVID-19 virus infection 04/03/2020   Hypertension    Lung cancer    Peripheral vascular disease     Past Surgical History:  Procedure Laterality Date   BLADDER REMOVAL     LOWER EXTREMITY ANGIOGRAPHY Left 04/12/2017   Procedure: Lower Extremity Angiography;  Surgeon: Renford Dills,  MD;  Location: [facility] INVASIVE CV LAB;  Service: Cardiovascular;  Laterality: Left;   PORTA CATH INSERTION N/A 05/22/2020   Procedure: PORTA CATH INSERTION;  Surgeon: Annice Needy, MD;  Location: [facility] INVASIVE CV LAB;  Service: Cardiovascular;  Laterality: N/A;   uretostomy     VASCULAR SURGERY       Social History   Tobacco Use   Smoking status: Some Days    Packs/day: 0.50    Years: 50.00    Additional pack years: 0.00    Total pack years: 25.00    Types: Cigarettes   Smokeless tobacco: Never   Tobacco comments:    Per pt. He smokes 1 pack every 3 days  Vaping Use   Vaping Use: Never used  Substance Use Topics   Alcohol use: No   Drug use: Not Currently    Types: Marijuana     History reviewed. No pertinent family history.  No Known Allergies   REVIEW OF SYSTEMS (Negative unless checked)  Constitutional: Weight loss  Fever  Chills Cardiac: Chest pain   Chest pressure   Palpitations   Shortness of breath when laying flat   Shortness of breath at rest   Shortness of breath with  exertion. Vascular:  [x] Pain in legs with walking   [] Pain in legs at rest   [] Pain in legs when laying flat   [x] Claudication   [] Pain in feet when walking  [] Pain in feet at rest  [] Pain in feet when laying flat   [] History of DVT   [] Phlebitis   [] Swelling in legs   [] Varicose veins   [] Non-healing ulcers Pulmonary:   [] Uses home oxygen   [] Productive cough   [] Hemoptysis   [] Wheeze  [] COPD   [] Asthma Neurologic:  [] Dizziness  [] Blackouts   [] Seizures   [] History of stroke   [] History of TIA  [] Aphasia   [] Temporary blindness   [] Dysphagia   [] Weakness or numbness in arms   [] Weakness or numbness in legs Musculoskeletal:  [] Arthritis   [] Joint swelling   [x] Joint pain   [] Low back pain Hematologic:  [] Easy bruising  [] Easy bleeding   [] Hypercoagulable state   [] Anemic  [] Hepatitis Gastrointestinal:  [] Blood in stool   [] Vomiting blood  [] Gastroesophageal reflux/heartburn    [] Difficulty swallowing. Genitourinary:  [] Chronic kidney disease   [] Difficult urination  [] Frequent urination  [] Burning with urination   [] Blood in urine Skin:  [] Rashes   [] Ulcers   [] Wounds Psychological:  [] History of anxiety   []  History of major depression.  Physical Examination  Vitals:   11/15/22 0929  BP: (!) 163/83  Pulse: 64  Resp: 15  Temp: 98 F (36.7 C)  TempSrc: Oral  SpO2: 90%  Weight: 81.6 kg  Height: 5\' 11"  (1.803 m)   Body mass index is 25.1 kg/m. Gen: WD/WN, NAD Head: [place]/AT, No temporalis wasting.  Ear/Nose/Throat: Hearing grossly intact, nares w/o erythema or drainage, oropharynx w/o Erythema/Exudate,  Eyes: Conjunctiva clear, sclera non-icteric Neck: Trachea midline.  No JVD.  Pulmonary:  Good air movement, respirations not labored, no use of accessory muscles.  Cardiac: RRR, normal S1, S2. Vascular: port in right chest Vessel Right Left  Radial Palpable Palpable       Musculoskeletal: M/S 5/5 throughout.  Extremities without ischemic changes.  No deformity or atrophy.  Neurologic: Sensation grossly intact in extremities.  Symmetrical.  Speech is fluent. Motor exam as listed above. Psychiatric: Judgment intact, Mood & affect appropriate for pt's clinical situation. Dermatologic: No rashes or ulcers noted.  No cellulitis or open wounds.      CBC Lab Results  Component Value Date   WBC 7.0 10/11/2022   HGB 15.6 10/11/2022   HCT 46.3 10/11/2022   MCV 92 10/11/2022   PLT 181 09/13/2022    BMET    Component Value Date/Time   NA 141 10/11/2022 1543   NA 140 07/12/2013 1421   K 4.3 10/11/2022 1543   K 4.0 07/12/2013 1421   CL 105 10/11/2022 1543   CL 111 (H) 07/12/2013 1421   CO2 20 10/11/2022 1543   CO2 25 07/12/2013 1421   GLUCOSE 100 (H) 10/11/2022 1543   GLUCOSE 102 (H) 09/13/2022 1255   GLUCOSE 108 (H) 07/12/2013 1421   BUN 27 10/11/2022 1543   BUN 29 (H) 07/12/2013 1421   CREATININE 1.85 (H) 10/11/2022 1543   CREATININE 1.95  (H) 07/12/2013 1421   CALCIUM 10.2 10/11/2022 1543   CALCIUM 9.0 07/12/2013 1421   GFRNONAA 33 (L) 09/13/2022 1255   GFRNONAA 36 (L) 07/12/2013 1421   GFRAA 41 (L) 04/13/2020 0516   GFRAA 42 (L) 07/12/2013 1421   CrCl cannot be calculated (Patient's most recent lab result is older than the maximum 21 days allowed.).  COAG Lab Results  Component Value Date   INR 1.2 07/29/2022   INR 1.1 03/15/2013    Radiology No results found.   Assessment/Plan 1.  Lung cancer.  Completed treatment and no longer needs the port.  Desires to have this removed.  This will be done today.  Risks and benefits are discussed. 2.  Peripheral artery occlusive disease with multiple previous interventions and surgeries.  No recent evaluation.  This can be checked in the office as an outpatient. 3.  Chronic kidney disease stage IIIb.  No contrast planned for port removal today.   Festus Barren, MD  11/15/2022 9:35 AM

## 2022-11-15 NOTE — Op Note (Signed)
[place] [facility]       Operative Note  Date: 11/15/2022  Preoperative diagnosis:  1. Lung cancer, no longer using port  Postoperative diagnosis:  Same as above  Procedures: #1. Removal of right jugular port a cath   Surgeon: Festus Barren, MD  Anesthesia: Local with moderate conscious sedation for 12 minutes using 1 mg of Versed and 50 mcg of Fentanyl  Fluoroscopy time: none  Contrast used: 0  Estimated blood loss: Minimal  Indication for the procedure:  The patient is a 72 y.o. male who has completed his therapy for lung cancer and no longer needs their Port-A-Cath. The patient desires to have this removed. Risks and benefits including need for potential replacement with recurrent disease were discussed and patient is agreeable to proceed.  Description of procedure: The patient was brought to the vascular and interventional radiology suite. Moderate conscious sedation was administered during a face to face encounter with the patient throughout the procedure with my supervision of the RN administering medicines and monitoring the patient's vital signs, pulse oximetry, telemetry and mental status throughout from the start of the procedure until the patient was taken to the recovery room.  The right neck chest and shoulder were sterilely prepped and draped, and a sterile surgical field was created. The area was then anesthetized with 1% lidocaine copiously. The previous incision was reopened and electrocautery used to dissected down to the port and the catheter. These were dissected free and the catheter was gently removed from the vein in its entirety. The port was dissected out from the fibrous connective tissue and the Prolene sutures were removed. The port was then removed in its entirety including the catheter. The wound was then closed with a 3-0 Vicryl and a 4-0 Monocryl and Dermabond was placed as a dressing. The patient was then  taken to the recovery room in stable condition having tolerated the procedure well.  Complications: none  Condition: stable   Festus Barren, MD 11/15/2022 11:02 AM   This note was created with Dragon Medical transcription system. Any errors in dictation are purely unintentional.

## 2022-11-15 NOTE — Discharge Instructions (Signed)
Implanted Port Removal, Care After Refer to this sheet in the next few weeks. These instructions provide you with information about caring for yourself after your procedure. Your health care provider may also give you more specific instructions. Your treatment has been planned according to current medical practices, but problems sometimes occur. Call your health care provider if you have any problems or questions after your procedure. What can I expect after the procedure? After the procedure, it is common to have: Soreness or pain near your incision. Some swelling or bruising near your incision.  Follow these instructions at home: Medicines Take over-the-counter and prescription medicines only as told by your health care provider. If you were prescribed an antibiotic medicine, take it as told by your health care provider. Do not stop taking the antibiotic even if you start to feel better. Bathing You may shower tomorrow.  Incision care You have an incision with sutures that are dissolvable and skin glue over top incision.  This skin glue will wear off over time.  Do not peel or try to remove this yourself. Keep your dressing dry. Check your incision area every day for signs of infection and if present call your doctor and let them know. Check for: More redness, swelling, or pain. More fluid or blood. Warmth. Pus or a bad smell. Driving If you received a sedative, do not drive for 24 hours after the procedure. If you did not receive a sedative, ask your health care provider when it is safe to drive. Activity Return to your normal activities as told by your health care provider. Ask your health care provider what activities are safe for you. Until your health care provider says it is safe: Do not lift anything that is heavier than 10 lb (4.5 kg). Do not do activities that involve lifting your arms over your head. General instructions Do not use any tobacco products, such as cigarettes,  chewing tobacco, and e-cigarettes. Tobacco can delay healing. If you need help quitting, ask your health care provider. Keep all follow-up visits as told by your health care provider. This is important. Contact a health care provider if: You have more redness, swelling, or pain around your incision. You have more fluid or blood coming from your incision. Your incision feels warm to the touch. You have pus or a bad smell coming from your incision. You have a fever. You have pain that is not relieved by your pain medicine. Get help right away if: You have chest pain. You have difficulty breathing. This information is not intended to replace advice given to you by your health care provider. Make sure you discuss any questions you have with your health care provider. Document Released: 06/23/2015 Document Revised: 12/18/2015 Document Reviewed: 04/16/2015 [employer] Interactive Patient Education  2018 [employer]  

## 2022-12-01 DIAGNOSIS — J9600 Acute respiratory failure, unspecified whether with hypoxia or hypercapnia: Secondary | ICD-10-CM | POA: Diagnosis not present

## 2022-12-03 ENCOUNTER — Other Ambulatory Visit (INDEPENDENT_AMBULATORY_CARE_PROVIDER_SITE_OTHER): Payer: Self-pay | Admitting: Vascular Surgery

## 2022-12-03 DIAGNOSIS — Z9889 Other specified postprocedural states: Secondary | ICD-10-CM

## 2022-12-07 ENCOUNTER — Encounter (INDEPENDENT_AMBULATORY_CARE_PROVIDER_SITE_OTHER): Payer: Self-pay

## 2022-12-07 ENCOUNTER — Ambulatory Visit (INDEPENDENT_AMBULATORY_CARE_PROVIDER_SITE_OTHER): Payer: Self-pay | Admitting: Vascular Surgery

## 2022-12-16 ENCOUNTER — Encounter: Payer: Self-pay | Admitting: Oncology

## 2023-01-01 DIAGNOSIS — J9600 Acute respiratory failure, unspecified whether with hypoxia or hypercapnia: Secondary | ICD-10-CM | POA: Diagnosis not present

## 2023-01-13 ENCOUNTER — Encounter: Payer: Self-pay | Admitting: Family

## 2023-01-13 ENCOUNTER — Ambulatory Visit (INDEPENDENT_AMBULATORY_CARE_PROVIDER_SITE_OTHER): Payer: Medicare Other | Admitting: Family

## 2023-01-13 VITALS — BP 130/80 | HR 72 | Ht 71.0 in | Wt 190.6 lb

## 2023-01-13 DIAGNOSIS — N1832 Chronic kidney disease, stage 3b: Secondary | ICD-10-CM

## 2023-01-13 DIAGNOSIS — E782 Mixed hyperlipidemia: Secondary | ICD-10-CM

## 2023-01-13 DIAGNOSIS — J449 Chronic obstructive pulmonary disease, unspecified: Secondary | ICD-10-CM | POA: Diagnosis not present

## 2023-01-13 DIAGNOSIS — E538 Deficiency of other specified B group vitamins: Secondary | ICD-10-CM | POA: Diagnosis not present

## 2023-01-13 DIAGNOSIS — I10 Essential (primary) hypertension: Secondary | ICD-10-CM | POA: Diagnosis not present

## 2023-01-13 DIAGNOSIS — I739 Peripheral vascular disease, unspecified: Secondary | ICD-10-CM | POA: Diagnosis not present

## 2023-01-13 DIAGNOSIS — D701 Agranulocytosis secondary to cancer chemotherapy: Secondary | ICD-10-CM | POA: Diagnosis not present

## 2023-01-13 DIAGNOSIS — R5383 Other fatigue: Secondary | ICD-10-CM | POA: Diagnosis not present

## 2023-01-13 DIAGNOSIS — T451X5A Adverse effect of antineoplastic and immunosuppressive drugs, initial encounter: Secondary | ICD-10-CM | POA: Diagnosis not present

## 2023-01-13 DIAGNOSIS — R7303 Prediabetes: Secondary | ICD-10-CM | POA: Diagnosis not present

## 2023-01-13 DIAGNOSIS — E559 Vitamin D deficiency, unspecified: Secondary | ICD-10-CM | POA: Diagnosis not present

## 2023-01-13 NOTE — Progress Notes (Signed)
Established Patient Office Visit  Subjective:  Patient ID: Martin Jennings, male    DOB: Sep 29, 1950  Age: 72 y.o. MRN: [id number]  Chief Complaint  Patient presents with   Follow-up    3 month follow up    Patient is here today for his 3 months follow up.  He has been feeling fairly well since last appointment.   He does not have additional concerns to discuss today.  Labs are due today. He needs refills.   I have reviewed his active problem list, medication list, allergies, notes from last encounter, lab results for his appointment today.    No other concerns at this time.   Past Medical History:  Diagnosis Date   Acute respiratory failure with hypoxia (HCC) 07/29/2022   AKI (acute kidney injury) (HCC) 04/04/2020   Bladder cancer (HCC) 2012   CAP (community acquired pneumonia) 04/04/2020   COPD suggested by initial evaluation (HCC) 10/11/2022   COVID-19 virus infection 04/03/2020   Goals of care, counseling/discussion 05/19/2020   Hypertension    Influenza A with pneumonia 07/29/2022   Leg pain 01/25/2017   Lung cancer (HCC)    Peripheral vascular disease (HCC)    Sepsis (HCC) 07/29/2022    Past Surgical History:  Procedure Laterality Date   BLADDER REMOVAL     LOWER EXTREMITY ANGIOGRAPHY Left 04/12/2017   Procedure: Lower Extremity Angiography;  Surgeon: Renford Dills, MD;  Location: [facility];  Service: Cardiovascular;  Laterality: Left;   PORTA CATH INSERTION N/A 05/22/2020   Procedure: PORTA CATH INSERTION;  Surgeon: Annice Needy, MD;  Location: [facility];  Service: Cardiovascular;  Laterality: N/A;   PORTA CATH REMOVAL N/A 11/15/2022   Procedure: PORTA CATH REMOVAL;  Surgeon: Annice Needy, MD;  Location: [facility];  Service: Cardiovascular;  Laterality: N/A;   uretostomy     VASCULAR SURGERY      Social History   Socioeconomic History   Marital status: Married    Spouse name: Not on file   Number of children: Not on  file   Years of education: Not on file   Highest education level: Not on file  Occupational History   Not on file  Tobacco Use   Smoking status: Some Days    Packs/day: 0.50    Years: 50.00    Additional pack years: 0.00    Total pack years: 25.00    Types: Cigarettes   Smokeless tobacco: Never   Tobacco comments:    Per pt. He smokes 1 pack every 3 days  Vaping Use   Vaping Use: Never used  Substance and Sexual Activity   Alcohol use: No   Drug use: Not Currently    Types: Marijuana   Sexual activity: Not Currently  Other Topics Concern   Not on file  Social History Narrative   Not on file   Social Determinants of Health   Financial Resource Strain: Not on file  Food Insecurity: No Food Insecurity (07/30/2022)   Hunger Vital Sign    Worried About Running Out of Food in the Last Year: Never true    Ran Out of Food in the Last Year: Never true  Transportation Needs: No Transportation Needs (07/30/2022)   PRAPARE - Administrator, Civil Service (Medical): No    Lack of Transportation (Non-Medical): No  Physical Activity: Not on file  Stress: Not on file  Social Connections: Not on file  Intimate Partner Violence:  Not At Risk (07/30/2022)   Humiliation, Afraid, Rape, and Kick questionnaire    Fear of Current or Ex-Partner: No    Emotionally Abused: No    Physically Abused: No    Sexually Abused: No    History reviewed. No pertinent family history.  No Known Allergies  Review of Systems  All other systems reviewed and are negative.      Objective:   BP 130/80   Pulse 72   Ht 5\' 11"  (1.803 m)   Wt 190 lb 9.6 oz (86.5 kg)   SpO2 (!) 87%   BMI 26.58 kg/m   Vitals:   01/13/23 1307  BP: 130/80  Pulse: 72  Height: 5\' 11"  (1.803 m)  Weight: 190 lb 9.6 oz (86.5 kg)  SpO2: (!) 87%  BMI (Calculated): 26.6    Physical Exam Vitals and nursing note reviewed.  Constitutional:      Appearance: Normal appearance. He is normal weight.  Eyes:      Pupils: Pupils are equal, round, and reactive to light.  Cardiovascular:     Rate and Rhythm: Normal rate and regular rhythm.     Pulses: Normal pulses.     Heart sounds: Normal heart sounds.  Pulmonary:     Effort: Pulmonary effort is normal.     Breath sounds: Normal breath sounds.  Neurological:     Mental Status: He is alert.  Psychiatric:        Mood and Affect: Mood normal.        Behavior: Behavior normal.      Results for orders placed or performed in visit on 01/13/23  Lipid panel  Result Value Ref Range   Cholesterol, Total 121 100 - 199 mg/dL   Triglycerides 54 0 - 149 mg/dL   HDL 44 >60 mg/dL   VLDL Cholesterol Cal 12 5 - 40 mg/dL   LDL Chol Calc (NIH) 65 0 - 99 mg/dL   Chol/HDL Ratio 2.8 0.0 - 5.0 ratio  VITAMIN D 25 Hydroxy (Vit-D Deficiency, Fractures)  Result Value Ref Range   Vit D, 25-Hydroxy 28.3 (L) 30.0 - 100.0 ng/mL  CBC With Differential  Result Value Ref Range   WBC 6.0 3.4 - 10.8 x10E3/uL   RBC 5.24 4.14 - 5.80 x10E6/uL   Hemoglobin 16.7 13.0 - 17.7 g/dL   Hematocrit 63.0 16.0 - 51.0 %   MCV 91 79 - 97 fL   MCH 31.9 26.6 - 33.0 pg   MCHC 34.9 31.5 - 35.7 g/dL   RDW 10.9 32.3 - 55.7 %   Neutrophils 74 Not Estab. %   Lymphs 17 Not Estab. %   Monocytes 6 Not Estab. %   Eos 3 Not Estab. %   Basos 0 Not Estab. %   Neutrophils Absolute 4.4 1.4 - 7.0 x10E3/uL   Lymphocytes Absolute 1.0 0.7 - 3.1 x10E3/uL   Monocytes Absolute 0.4 0.1 - 0.9 x10E3/uL   EOS (ABSOLUTE) 0.2 0.0 - 0.4 x10E3/uL   Basophils Absolute 0.0 0.0 - 0.2 x10E3/uL   Immature Granulocytes 0 Not Estab. %   Immature Grans (Abs) 0.0 0.0 - 0.1 x10E3/uL  CMP14+EGFR  Result Value Ref Range   Glucose 99 70 - 99 mg/dL   BUN 27 8 - 27 mg/dL   Creatinine, Ser 3.22 (H) 0.76 - 1.27 mg/dL   eGFR 35 (L) >02 RK/YHC/6.23   BUN/Creatinine Ratio 14 10 - 24   Sodium 143 134 - 144 mmol/L   Potassium 4.6 3.5 - 5.2 mmol/L  Chloride 107 (H) 96 - 106 mmol/L   CO2 18 (L) 20 - 29 mmol/L   Calcium  9.5 8.6 - 10.2 mg/dL   Total Protein 7.7 6.0 - 8.5 g/dL   Albumin 4.2 3.8 - 4.8 g/dL   Globulin, Total 3.5 1.5 - 4.5 g/dL   Bilirubin Total 0.4 0.0 - 1.2 mg/dL   Alkaline Phosphatase 89 44 - 121 IU/L   AST 15 0 - 40 IU/L   ALT 12 0 - 44 IU/L  TSH  Result Value Ref Range   TSH 1.390 0.450 - 4.500 uIU/mL  Hemoglobin A1c  Result Value Ref Range   Hgb A1c MFr Bld 6.4 (H) 4.8 - 5.6 %   Est. average glucose Bld gHb Est-mCnc 137 mg/dL  Vitamin W09  Result Value Ref Range   Vitamin B-12 475 232 - 1,245 pg/mL    Recent Results (from the past 2160 hour(s))  Lipid panel     Status: None   Collection Time: 01/13/23  2:13 PM  Result Value Ref Range   Cholesterol, Total 121 100 - 199 mg/dL   Triglycerides 54 0 - 149 mg/dL   HDL 44 >81 mg/dL   VLDL Cholesterol Cal 12 5 - 40 mg/dL   LDL Chol Calc (NIH) 65 0 - 99 mg/dL   Chol/HDL Ratio 2.8 0.0 - 5.0 ratio    Comment:                                   T. Chol/HDL Ratio                                             Men  Women                               1/2 Avg.Risk  3.4    3.3                                   Avg.Risk  5.0    4.4                                2X Avg.Risk  9.6    7.1                                3X Avg.Risk 23.4   11.0   VITAMIN D 25 Hydroxy (Vit-D Deficiency, Fractures)     Status: Abnormal   Collection Time: 01/13/23  2:13 PM  Result Value Ref Range   Vit D, 25-Hydroxy 28.3 (L) 30.0 - 100.0 ng/mL    Comment: Vitamin D deficiency has been defined by the Institute of Medicine and an Endocrine Society practice guideline as a level of serum 25-OH vitamin D less than 20 ng/mL (1,2). The Endocrine Society went on to further define vitamin D insufficiency as a level between 21 and 29 ng/mL (2). 1. IOM (Institute of Medicine). 2010. Dietary reference    intakes for calcium and D. [place]: The    [employer]. 2. Holick MF, Binkley Chetek, Bischoff-Ferrari HA,  et al.    Evaluation, treatment, and prevention  of vitamin D    deficiency: an Endocrine Society clinical practice    guideline. JCEM. 2011 Jul; 96(7):1911-30.   CBC With Differential     Status: None   Collection Time: 01/13/23  2:13 PM  Result Value Ref Range   WBC 6.0 3.4 - 10.8 x10E3/uL   RBC 5.24 4.14 - 5.80 x10E6/uL   Hemoglobin 16.7 13.0 - 17.7 g/dL   Hematocrit 84.6 96.2 - 51.0 %   MCV 91 79 - 97 fL   MCH 31.9 26.6 - 33.0 pg   MCHC 34.9 31.5 - 35.7 g/dL   RDW 95.2 84.1 - 32.4 %   Neutrophils 74 Not Estab. %   Lymphs 17 Not Estab. %   Monocytes 6 Not Estab. %   Eos 3 Not Estab. %   Basos 0 Not Estab. %   Neutrophils Absolute 4.4 1.4 - 7.0 x10E3/uL   Lymphocytes Absolute 1.0 0.7 - 3.1 x10E3/uL   Monocytes Absolute 0.4 0.1 - 0.9 x10E3/uL   EOS (ABSOLUTE) 0.2 0.0 - 0.4 x10E3/uL   Basophils Absolute 0.0 0.0 - 0.2 x10E3/uL   Immature Granulocytes 0 Not Estab. %   Immature Grans (Abs) 0.0 0.0 - 0.1 x10E3/uL    Comment: **Effective February 21, 2023, profile [id number] CBC/Differential**   (No Platelet) will be made non-orderable. [facility] Offers:   [id number] CBC With Differential/Platelet   CMP14+EGFR     Status: Abnormal   Collection Time: 01/13/23  2:13 PM  Result Value Ref Range   Glucose 99 70 - 99 mg/dL   BUN 27 8 - 27 mg/dL   Creatinine, Ser 2.53 (H) 0.76 - 1.27 mg/dL   eGFR 35 (L) >66 YQ/IHK/7.42   BUN/Creatinine Ratio 14 10 - 24   Sodium 143 134 - 144 mmol/L   Potassium 4.6 3.5 - 5.2 mmol/L   Chloride 107 (H) 96 - 106 mmol/L   CO2 18 (L) 20 - 29 mmol/L   Calcium 9.5 8.6 - 10.2 mg/dL   Total Protein 7.7 6.0 - 8.5 g/dL   Albumin 4.2 3.8 - 4.8 g/dL   Globulin, Total 3.5 1.5 - 4.5 g/dL   Bilirubin Total 0.4 0.0 - 1.2 mg/dL   Alkaline Phosphatase 89 44 - 121 IU/L   AST 15 0 - 40 IU/L   ALT 12 0 - 44 IU/L  TSH     Status: None   Collection Time: 01/13/23  2:13 PM  Result Value Ref Range   TSH 1.390 0.450 - 4.500 uIU/mL  Hemoglobin A1c     Status: Abnormal   Collection Time: 01/13/23  2:13 PM  Result Value Ref Range    Hgb A1c MFr Bld 6.4 (H) 4.8 - 5.6 %    Comment:          Prediabetes: 5.7 - 6.4          Diabetes: >6.4          Glycemic control for adults with diabetes: <7.0    Est. average glucose Bld gHb Est-mCnc 137 mg/dL  Vitamin V95     Status: None   Collection Time: 01/13/23  2:13 PM  Result Value Ref Range   Vitamin B-12 475 232 - 1,245 pg/mL       Assessment & Plan:   Problem List Items Addressed This Visit       Active Problems   Essential hypertension    Blood pressure well controlled with current medications.  Continue current  therapy.  Will reassess at follow up.       Relevant Orders   CBC With Differential (Completed)   CMP14+EGFR (Completed)   Hyperlipidemia - Primary    Checking labs today.  Continue current therapy for lipid control. Will modify as needed based on labwork results.       Relevant Orders   Lipid panel (Completed)   CBC With Differential (Completed)   CMP14+EGFR (Completed)   PVD (peripheral vascular disease) (HCC)    Patient is seen by Vascular, who manage this condition.  He is well controlled with current therapy.   Will defer to them for further changes to plan of care.       Chemotherapy induced neutropenia (HCC)    Patient stable.  Well controlled with current therapy.   Continue current meds.       Prediabetes    A1C Continues to be in prediabetic ranges.  Will reassess at follow up after next lab check.  Patient counseled on dietary choices and verbalized understanding.       Relevant Orders   CBC With Differential (Completed)   CMP14+EGFR (Completed)   Hemoglobin A1c (Completed)   Stage 3b chronic kidney disease (CKD) (HCC)    Patient is seen by Nephrology, who manage this condition.  He is well controlled with current therapy.   Will defer to them for further changes to plan of care.        Resolved Problems   RESOLVED: COPD suggested by initial evaluation (HCC)   Relevant Orders   CBC With Differential  (Completed)   CMP14+EGFR (Completed)   Other Visit Diagnoses     B12 deficiency due to diet       Checking labs today.  Will continue supplements as needed.   Relevant Orders   CBC With Differential (Completed)   CMP14+EGFR (Completed)   Vitamin B12 (Completed)   Vitamin D deficiency, unspecified       Checking labs today.  Will continue supplements as needed.   Relevant Orders   VITAMIN D 25 Hydroxy (Vit-D Deficiency, Fractures) (Completed)   CBC With Differential (Completed)   CMP14+EGFR (Completed)   Other fatigue       Relevant Orders   CBC With Differential (Completed)   CMP14+EGFR (Completed)   TSH (Completed)       Return in about 3 months (around 04/15/2023) for F/U.   Total time spent: 30 minutes  Miki Kins, FNP  01/13/2023   This document may have been prepared by [facility] Voice Recognition software and as such may include unintentional dictation errors.

## 2023-01-14 LAB — CMP14+EGFR
ALT: 12 IU/L (ref 0–44)
AST: 15 IU/L (ref 0–40)
Albumin: 4.2 g/dL (ref 3.8–4.8)
Alkaline Phosphatase: 89 IU/L (ref 44–121)
BUN/Creatinine Ratio: 14 (ref 10–24)
BUN: 27 mg/dL (ref 8–27)
Bilirubin Total: 0.4 mg/dL (ref 0.0–1.2)
CO2: 18 mmol/L — ABNORMAL LOW (ref 20–29)
Calcium: 9.5 mg/dL (ref 8.6–10.2)
Chloride: 107 mmol/L — ABNORMAL HIGH (ref 96–106)
Creatinine, Ser: 1.98 mg/dL — ABNORMAL HIGH (ref 0.76–1.27)
Globulin, Total: 3.5 g/dL (ref 1.5–4.5)
Glucose: 99 mg/dL (ref 70–99)
Potassium: 4.6 mmol/L (ref 3.5–5.2)
Sodium: 143 mmol/L (ref 134–144)
Total Protein: 7.7 g/dL (ref 6.0–8.5)
eGFR: 35 mL/min/{1.73_m2} — ABNORMAL LOW (ref >59)

## 2023-01-14 LAB — CBC WITH DIFFERENTIAL
Basophils Absolute: 0 10*3/uL (ref 0.0–0.2)
Basos: 0 %
EOS (ABSOLUTE): 0.2 10*3/uL (ref 0.0–0.4)
Eos: 3 %
Hematocrit: 47.9 % (ref 37.5–51.0)
Hemoglobin: 16.7 g/dL (ref 13.0–17.7)
Immature Grans (Abs): 0 10*3/uL (ref 0.0–0.1)
Immature Granulocytes: 0 %
Lymphocytes Absolute: 1 10*3/uL (ref 0.7–3.1)
Lymphs: 17 %
MCH: 31.9 pg (ref 26.6–33.0)
MCHC: 34.9 g/dL (ref 31.5–35.7)
MCV: 91 fL (ref 79–97)
Monocytes Absolute: 0.4 10*3/uL (ref 0.1–0.9)
Monocytes: 6 %
Neutrophils Absolute: 4.4 10*3/uL (ref 1.4–7.0)
Neutrophils: 74 %
RBC: 5.24 x10E6/uL (ref 4.14–5.80)
RDW: 11.9 % (ref 11.6–15.4)
WBC: 6 10*3/uL (ref 3.4–10.8)

## 2023-01-14 LAB — HEMOGLOBIN A1C
Est. average glucose Bld gHb Est-mCnc: 137 mg/dL
Hgb A1c MFr Bld: 6.4 % — ABNORMAL HIGH (ref 4.8–5.6)

## 2023-01-14 LAB — LIPID PANEL
Chol/HDL Ratio: 2.8 ratio (ref 0.0–5.0)
Cholesterol, Total: 121 mg/dL (ref 100–199)
HDL: 44 mg/dL (ref >39)
LDL Chol Calc (NIH): 65 mg/dL (ref 0–99)
Triglycerides: 54 mg/dL (ref 0–149)
VLDL Cholesterol Cal: 12 mg/dL (ref 5–40)

## 2023-01-14 LAB — VITAMIN D 25 HYDROXY (VIT D DEFICIENCY, FRACTURES): Vit D, 25-Hydroxy: 28.3 ng/mL — ABNORMAL LOW (ref 30.0–100.0)

## 2023-01-14 LAB — TSH: TSH: 1.39 u[IU]/mL (ref 0.450–4.500)

## 2023-01-14 LAB — VITAMIN B12: Vitamin B-12: 475 pg/mL (ref 232–1245)

## 2023-01-15 ENCOUNTER — Encounter: Payer: Self-pay | Admitting: Family

## 2023-01-15 NOTE — Assessment & Plan Note (Signed)
Patient is seen by Vascular, who manage this condition.  He is well controlled with current therapy.   Will defer to them for further changes to plan of care.

## 2023-01-15 NOTE — Assessment & Plan Note (Signed)
Patient stable.  Well controlled with current therapy.   Continue current meds.  

## 2023-01-15 NOTE — Assessment & Plan Note (Signed)
A1C Continues to be in prediabetic ranges.  Will reassess at follow up after next lab check.  Patient counseled on dietary choices and verbalized understanding.   

## 2023-01-15 NOTE — Assessment & Plan Note (Signed)
Checking labs today.  Continue current therapy for lipid control. Will modify as needed based on labwork results.  

## 2023-01-15 NOTE — Assessment & Plan Note (Signed)
Blood pressure well controlled with current medications.  Continue current therapy.  Will reassess at follow up.  

## 2023-01-15 NOTE — Assessment & Plan Note (Signed)
Patient is seen by Nephrology, who manage this condition.  He is well controlled with current therapy.   Will defer to them for further changes to plan of care.

## 2023-01-25 ENCOUNTER — Other Ambulatory Visit: Payer: Self-pay

## 2023-01-25 MED ORDER — COLCHICINE 0.6 MG PO TABS: 0.6000 mg | ORAL_TABLET | Freq: Every day | ORAL | 0 refills | Status: DC

## 2023-01-31 DIAGNOSIS — J9600 Acute respiratory failure, unspecified whether with hypoxia or hypercapnia: Secondary | ICD-10-CM | POA: Diagnosis not present

## 2023-03-03 DIAGNOSIS — J9600 Acute respiratory failure, unspecified whether with hypoxia or hypercapnia: Secondary | ICD-10-CM | POA: Diagnosis not present

## 2023-03-08 ENCOUNTER — Telehealth: Payer: Self-pay | Admitting: Family

## 2023-03-08 NOTE — Telephone Encounter (Signed)
Patient called in about an inhaler that was supposed to be sent in at his last visit. States Marchelle Folks gave him a sample at his visit. It is Trelegy [phone]. Can you send to pharmacy please?  [facility]

## 2023-03-10 ENCOUNTER — Other Ambulatory Visit: Payer: Self-pay

## 2023-03-10 MED ORDER — TRELEGY ELLIPTA 100-62.5-25 MCG/ACT IN AEPB: 1.0000 | INHALATION_SPRAY | Freq: Every day | RESPIRATORY_TRACT | 1 refills | Status: DC

## 2023-03-10 NOTE — Telephone Encounter (Signed)
Done

## 2023-03-14 ENCOUNTER — Ambulatory Visit: Payer: Medicare Other

## 2023-03-16 ENCOUNTER — Telehealth: Payer: Self-pay | Admitting: Family

## 2023-03-16 NOTE — Telephone Encounter (Signed)
Patient's wife left VM needing new Rx for inhaler. I assume she is referring to the Trelegy but we need to call and confirm this.

## 2023-03-17 MED ORDER — TRELEGY ELLIPTA 100-62.5-25 MCG/ACT IN AEPB: 1.0000 | INHALATION_SPRAY | Freq: Every day | RESPIRATORY_TRACT | 1 refills | Status: DC

## 2023-03-18 ENCOUNTER — Other Ambulatory Visit: Payer: Self-pay

## 2023-03-18 ENCOUNTER — Other Ambulatory Visit: Payer: Self-pay | Admitting: *Deleted

## 2023-03-18 ENCOUNTER — Encounter: Payer: Self-pay | Admitting: Oncology

## 2023-03-18 ENCOUNTER — Inpatient Hospital Stay: Payer: Medicare Other

## 2023-03-18 ENCOUNTER — Inpatient Hospital Stay: Payer: Medicare Other | Attending: Oncology | Admitting: Oncology

## 2023-03-18 DIAGNOSIS — Z923 Personal history of irradiation: Secondary | ICD-10-CM | POA: Insufficient documentation

## 2023-03-18 DIAGNOSIS — F17210 Nicotine dependence, cigarettes, uncomplicated: Secondary | ICD-10-CM | POA: Diagnosis not present

## 2023-03-18 DIAGNOSIS — C3490 Malignant neoplasm of unspecified part of unspecified bronchus or lung: Secondary | ICD-10-CM | POA: Insufficient documentation

## 2023-03-18 DIAGNOSIS — Z85118 Personal history of other malignant neoplasm of bronchus and lung: Secondary | ICD-10-CM

## 2023-03-18 DIAGNOSIS — C3411 Malignant neoplasm of upper lobe, right bronchus or lung: Secondary | ICD-10-CM

## 2023-03-18 DIAGNOSIS — Z9221 Personal history of antineoplastic chemotherapy: Secondary | ICD-10-CM | POA: Diagnosis not present

## 2023-03-18 DIAGNOSIS — Z7902 Long term (current) use of antithrombotics/antiplatelets: Secondary | ICD-10-CM | POA: Insufficient documentation

## 2023-03-18 DIAGNOSIS — Z79899 Other long term (current) drug therapy: Secondary | ICD-10-CM | POA: Diagnosis not present

## 2023-03-18 DIAGNOSIS — Z8551 Personal history of malignant neoplasm of bladder: Secondary | ICD-10-CM | POA: Insufficient documentation

## 2023-03-18 DIAGNOSIS — Z08 Encounter for follow-up examination after completed treatment for malignant neoplasm: Secondary | ICD-10-CM

## 2023-03-18 LAB — CBC WITH DIFFERENTIAL/PLATELET
Abs Immature Granulocytes: 0.01 10*3/uL (ref 0.00–0.07)
Basophils Absolute: 0 10*3/uL (ref 0.0–0.1)
Basophils Relative: 0 %
Eosinophils Absolute: 0.1 10*3/uL (ref 0.0–0.5)
Eosinophils Relative: 2 %
HCT: 49.1 % (ref 39.0–52.0)
Hemoglobin: 16.2 g/dL (ref 13.0–17.0)
Immature Granulocytes: 0 %
Lymphocytes Relative: 14 %
Lymphs Abs: 1 10*3/uL (ref 0.7–4.0)
MCH: 31.4 pg (ref 26.0–34.0)
MCHC: 33 g/dL (ref 30.0–36.0)
MCV: 95.2 fL (ref 80.0–100.0)
Monocytes Absolute: 0.7 10*3/uL (ref 0.1–1.0)
Monocytes Relative: 9 %
Neutro Abs: 5.7 10*3/uL (ref 1.7–7.7)
Neutrophils Relative %: 75 %
Platelets: 164 10*3/uL (ref 150–400)
RBC: 5.16 MIL/uL (ref 4.22–5.81)
RDW: 12.5 % (ref 11.5–15.5)
WBC: 7.6 10*3/uL (ref 4.0–10.5)
nRBC: 0 % (ref 0.0–0.2)

## 2023-03-18 LAB — COMPREHENSIVE METABOLIC PANEL
ALT: 11 U/L (ref 0–44)
AST: 12 U/L — ABNORMAL LOW (ref 15–41)
Albumin: 3.5 g/dL (ref 3.5–5.0)
Alkaline Phosphatase: 59 U/L (ref 38–126)
Anion gap: 7 (ref 5–15)
BUN: 29 mg/dL — ABNORMAL HIGH (ref 8–23)
CO2: 22 mmol/L (ref 22–32)
Calcium: 8.5 mg/dL — ABNORMAL LOW (ref 8.9–10.3)
Chloride: 106 mmol/L (ref 98–111)
Creatinine, Ser: 2.05 mg/dL — ABNORMAL HIGH (ref 0.61–1.24)
GFR, Estimated: 34 mL/min — ABNORMAL LOW (ref >60)
Glucose, Bld: 107 mg/dL — ABNORMAL HIGH (ref 70–99)
Potassium: 3.5 mmol/L (ref 3.5–5.1)
Sodium: 135 mmol/L (ref 135–145)
Total Bilirubin: 0.7 mg/dL (ref 0.3–1.2)
Total Protein: 7.4 g/dL (ref 6.5–8.1)

## 2023-03-19 ENCOUNTER — Encounter: Payer: Self-pay | Admitting: Oncology

## 2023-03-19 NOTE — Progress Notes (Signed)
Hematology/Oncology Consult note [facility]  [phone] [phone]  Patient Care Team: Miki Kins, FNP as PCP - General (Family Medicine) Glory Buff, RN as Oncology Nurse Navigator Creig Hines, MD as Consulting Physician (Hematology and Oncology) Carmina Miller, MD as Consulting Physician (Radiation Oncology)   Name of the patient: Martin Jennings  [id number]  [birth year]   Date of visit: 03/19/23  Diagnosis- history of stage IIIc adenocarcinoma of the lung cT3 cN3 M0     Chief complaint/ Reason for visit-routine follow-up of lung cancer  Heme/Onc history: Patient is a 72 year old male with a history of tobacco dependence who was admitted to the hospital for symptoms of acute onset shortness of breath and was found to have Covid pneumonia.  He underwent CT angio chest which showed extensive mediastinal as well as bilateral hilar adenopathy.  Masslike opacity with architectural distortion involving the right upper lobe measuring 5.7 x 2.9 x 2.3 cm and groundglass opacities bilaterally.  Mass involving the left adrenal gland as well as a smaller right adrenal gland possibly indicating adrenal metastases.     This was followed by a PET CT scan 2 weeks later which showed the consolidative process in the right upper lobe suv 7.88 concerning for infiltrating tumor.  It appears to involve the pleura associated interstitial thickening was also seen.  Bulky right hilar and mediastinal adenopathy.  Enlarged somewhat necrotic appearing left supraclavicular lymph node was also hypermetabolic.  Adrenal gland was not hypermetabolic and was consistent with a benign adenoma.  No metastatic disease involving liver.  No features of distant metastatic disease.   Pathology showed metastatic non-small cell lung cancer which was TTF-1 positive consistent with adenocarcinoma   Patient had concurrent chemoradiation followed by maintenance durvalumab  ending in January 2023.  He has remained in remission since then.      Interval history-reports feeling well overall.  Denies any specific complaints at this time and is in chronic fatigue and occasional exertional shortness of breath  ECOG PS- 1 Pain scale- 0   Review of systems- Review of Systems  Constitutional:  Positive for malaise/fatigue. Negative for chills, fever and weight loss.  HENT:  Negative for congestion, ear discharge and nosebleeds.   Eyes:  Negative for blurred vision.  Respiratory:  Positive for shortness of breath. Negative for cough, hemoptysis, sputum production and wheezing.   Cardiovascular:  Negative for chest pain, palpitations, orthopnea and claudication.  Gastrointestinal:  Negative for abdominal pain, blood in stool, constipation, diarrhea, heartburn, melena, nausea and vomiting.  Genitourinary:  Negative for dysuria, flank pain, frequency, hematuria and urgency.  Musculoskeletal:  Negative for back pain, joint pain and myalgias.  Skin:  Negative for rash.  Neurological:  Negative for dizziness, tingling, focal weakness, seizures, weakness and headaches.  Endo/Heme/Allergies:  Does not bruise/bleed easily.  Psychiatric/Behavioral:  Negative for depression and suicidal ideas. The patient does not have insomnia.       No Known Allergies   Past Medical History:  Diagnosis Date   Acute respiratory failure with hypoxia (HCC) 07/29/2022   AKI (acute kidney injury) (HCC) 04/04/2020   Bladder cancer (HCC) 2012   CAP (community acquired pneumonia) 04/04/2020   COPD suggested by initial evaluation (HCC) 10/11/2022   COVID-19 virus infection 04/03/2020   Goals of care, counseling/discussion 05/19/2020   Hypertension    Influenza A with pneumonia 07/29/2022   Leg pain 01/25/2017   Lung cancer (HCC)    Peripheral vascular disease (HCC)  Sepsis (HCC) 07/29/2022     Past Surgical History:  Procedure Laterality Date   BLADDER REMOVAL     LOWER  EXTREMITY ANGIOGRAPHY Left 04/12/2017   Procedure: Lower Extremity Angiography;  Surgeon: Renford Dills, MD;  Location: [facility];  Service: Cardiovascular;  Laterality: Left;   PORTA CATH INSERTION N/A 05/22/2020   Procedure: PORTA CATH INSERTION;  Surgeon: Annice Needy, MD;  Location: [facility];  Service: Cardiovascular;  Laterality: N/A;   PORTA CATH REMOVAL N/A 11/15/2022   Procedure: PORTA CATH REMOVAL;  Surgeon: Annice Needy, MD;  Location: [facility];  Service: Cardiovascular;  Laterality: N/A;   uretostomy     VASCULAR SURGERY      Social History   Socioeconomic History   Marital status: Married    Spouse name: Not on file   Number of children: Not on file   Years of education: Not on file   Highest education level: Not on file  Occupational History   Not on file  Tobacco Use   Smoking status: Some Days    Current packs/day: 0.50    Average packs/day: 0.5 packs/day for 50.0 years (25.0 ttl pk-yrs)    Types: Cigarettes   Smokeless tobacco: Never   Tobacco comments:    Per pt. He smokes 1 pack every 3 days  Vaping Use   Vaping status: Never Used  Substance and Sexual Activity   Alcohol use: No   Drug use: Not Currently    Types: Marijuana   Sexual activity: Not Currently  Other Topics Concern   Not on file  Social History Narrative   Not on file   Social Determinants of Health   Financial Resource Strain: Not on file  Food Insecurity: No Food Insecurity (07/30/2022)   Hunger Vital Sign    Worried About Running Out of Food in the Last Year: Never true    Ran Out of Food in the Last Year: Never true  Transportation Needs: No Transportation Needs (07/30/2022)   PRAPARE - Administrator, Civil Service (Medical): No    Lack of Transportation (Non-Medical): No  Physical Activity: Not on file  Stress: Not on file  Social Connections: Not on file  Intimate Partner Violence: Not At Risk (07/30/2022)   Humiliation, Afraid,  Rape, and Kick questionnaire    Fear of Current or Ex-Partner: No    Emotionally Abused: No    Physically Abused: No    Sexually Abused: No    History reviewed. No pertinent family history.   Current Outpatient Medications:    albuterol (VENTOLIN HFA) 108 (90 Base) MCG/ACT inhaler, Inhale into the lungs., Disp: , Rfl:    amLODipine (NORVASC) 10 MG tablet, TAKE 1 TABLET BY MOUTH  DAILY, Disp: 90 tablet, Rfl: 3   atorvastatin (LIPITOR) 20 MG tablet, TAKE 1 TABLET BY MOUTH IN  THE EVENING, Disp: 90 tablet, Rfl: 3   carvedilol (COREG) 6.25 MG tablet, TAKE 1 TABLET BY MOUTH  TWICE DAILY, Disp: 180 tablet, Rfl: 3   cholecalciferol (VITAMIN D3) 25 MCG (1000 UNIT) tablet, Take 1,000 Units by mouth daily., Disp: , Rfl:    clopidogrel (PLAVIX) 75 MG tablet, TAKE 1 TABLET BY MOUTH  DAILY, Disp: 90 tablet, Rfl: 3   colchicine 0.6 MG tablet, Take 1 tablet (0.6 mg total) by mouth daily., Disp: 90 tablet, Rfl: 0   Ergocalciferol (VITAMIN D2) 10 MCG (400 UNIT) TABS, Take 1 tablet by mouth 1 day or  1 dose., Disp: , Rfl:    Fluticasone-Umeclidin-Vilant (TRELEGY ELLIPTA) 100-62.5-25 MCG/ACT AEPB, Inhale 1 Inhalation into the lungs daily., Disp: 60 each, Rfl: 1   fexofenadine (ALLEGRA) 180 MG tablet, Take 180 mg by mouth daily as needed. (Patient not taking: Reported on 11/15/2022), Disp: , Rfl:    varenicline (CHANTIX) 0.5 MG tablet, Days 1 to 3, take 0.5 mg tablet once daily. On Days 4 to 7 take 0.5 mg tablet twice daily, on days 8 to ongoing, take 1 mg tablet twice daily. (Patient not taking: Reported on 11/15/2022), Disp: 60 tablet, Rfl: 0 No current facility-administered medications for this visit.  Facility-Administered Medications Ordered in Other Visits:    sodium chloride flush (NS) 0.9 % injection 10 mL, 10 mL, Intravenous, PRN, Creig Hines, MD, 10 mL at 09/18/20 0850  Physical exam:  Vitals:   03/18/23 1317 03/18/23 1320  BP: (!) 153/81 (!) 144/82  Pulse: 83 83  Resp: 18   Temp: (!) 96.2 F  (35.7 C)   TempSrc: Tympanic   SpO2: 93%   Weight: 185 lb 14.4 oz (84.3 kg)   Height: 5\' 11"  (1.803 m)    Physical Exam Cardiovascular:     Rate and Rhythm: Normal rate and regular rhythm.     Heart sounds: Normal heart sounds.  Pulmonary:     Effort: Pulmonary effort is normal.     Breath sounds: Normal breath sounds.  Abdominal:     General: Bowel sounds are normal.     Palpations: Abdomen is soft.  Skin:    General: Skin is warm and dry.  Neurological:     Mental Status: He is alert and oriented to person, place, and time.         Latest Ref Rng & Units 03/18/2023    1:00 PM  CMP  Glucose 70 - 99 mg/dL 956   BUN 8 - 23 mg/dL 29   Creatinine 2.13 - 1.24 mg/dL 0.86   Sodium 578 - 469 mmol/L 135   Potassium 3.5 - 5.1 mmol/L 3.5   Chloride 98 - 111 mmol/L 106   CO2 22 - 32 mmol/L 22   Calcium 8.9 - 10.3 mg/dL 8.5   Total Protein 6.5 - 8.1 g/dL 7.4   Total Bilirubin 0.3 - 1.2 mg/dL 0.7   Alkaline Phos 38 - 126 U/L 59   AST 15 - 41 U/L 12   ALT 0 - 44 U/L 11       Latest Ref Rng & Units 03/18/2023    1:00 PM  CBC  WBC 4.0 - 10.5 K/uL 7.6   Hemoglobin 13.0 - 17.0 g/dL 62.9   Hematocrit 52.8 - 52.0 % 49.1   Platelets 150 - 400 K/uL 164     No images are attached to the encounter.  No results found.   Assessment and plan- Patient is a 72 y.o. male with history of stage III adenocarcinoma of the lung s/p concurrent chemoradiation followed by maintenance durvalumab.  He is here for a routine surveillance visit for lung cancer  Patient was supposed to get a CT scan before he saw me for this visit.  His last scan in February 2024 did not reveal any evidence of recurrent or progressive disease.  We will reschedule his CT scans to be done in the next 2 to 3 weeks and I will see him back in 6 months   Visit Diagnosis 1. Encounter for follow-up surveillance of lung cancer      Dr.  Owens Shark, MD, MPH [facility] [phone] 03/19/2023 3:29 PM

## 2023-04-08 ENCOUNTER — Ambulatory Visit: Admission: RE | Admit: 2023-04-08 | Payer: Medicare Other | Source: Ambulatory Visit

## 2023-04-15 ENCOUNTER — Ambulatory Visit: Payer: Medicare Other | Admitting: Family

## 2023-04-21 ENCOUNTER — Ambulatory Visit: Payer: Medicare Other | Admitting: Family

## 2023-05-13 ENCOUNTER — Ambulatory Visit: Payer: Medicare Other | Admitting: Family

## 2023-05-17 ENCOUNTER — Ambulatory Visit (INDEPENDENT_AMBULATORY_CARE_PROVIDER_SITE_OTHER): Payer: Medicare Other | Admitting: Family

## 2023-05-17 VITALS — BP 160/80 | HR 60 | Ht 71.0 in | Wt 180.8 lb

## 2023-05-17 DIAGNOSIS — E782 Mixed hyperlipidemia: Secondary | ICD-10-CM | POA: Diagnosis not present

## 2023-05-17 DIAGNOSIS — N1832 Chronic kidney disease, stage 3b: Secondary | ICD-10-CM | POA: Diagnosis not present

## 2023-05-17 DIAGNOSIS — D701 Agranulocytosis secondary to cancer chemotherapy: Secondary | ICD-10-CM | POA: Diagnosis not present

## 2023-05-17 DIAGNOSIS — E559 Vitamin D deficiency, unspecified: Secondary | ICD-10-CM | POA: Diagnosis not present

## 2023-05-17 DIAGNOSIS — E538 Deficiency of other specified B group vitamins: Secondary | ICD-10-CM | POA: Diagnosis not present

## 2023-05-17 DIAGNOSIS — I10 Essential (primary) hypertension: Secondary | ICD-10-CM | POA: Diagnosis not present

## 2023-05-17 DIAGNOSIS — R7303 Prediabetes: Secondary | ICD-10-CM

## 2023-05-17 DIAGNOSIS — R5383 Other fatigue: Secondary | ICD-10-CM

## 2023-05-17 DIAGNOSIS — T451X5A Adverse effect of antineoplastic and immunosuppressive drugs, initial encounter: Secondary | ICD-10-CM

## 2023-05-17 DIAGNOSIS — E1165 Type 2 diabetes mellitus with hyperglycemia: Secondary | ICD-10-CM

## 2023-05-18 LAB — CMP14+EGFR
ALT: 8 [IU]/L (ref 0–44)
AST: 10 [IU]/L (ref 0–40)
Albumin: 4 g/dL (ref 3.8–4.8)
Alkaline Phosphatase: 80 [IU]/L (ref 44–121)
BUN/Creatinine Ratio: 11 (ref 10–24)
BUN: 24 mg/dL (ref 8–27)
Bilirubin Total: 0.7 mg/dL (ref 0.0–1.2)
CO2: 22 mmol/L (ref 20–29)
Calcium: 9.2 mg/dL (ref 8.6–10.2)
Chloride: 104 mmol/L (ref 96–106)
Creatinine, Ser: 2.13 mg/dL — ABNORMAL HIGH (ref 0.76–1.27)
Globulin, Total: 3.3 g/dL (ref 1.5–4.5)
Glucose: 84 mg/dL (ref 70–99)
Potassium: 4.4 mmol/L (ref 3.5–5.2)
Sodium: 144 mmol/L (ref 134–144)
Total Protein: 7.3 g/dL (ref 6.0–8.5)
eGFR: 32 mL/min/{1.73_m2} — ABNORMAL LOW (ref >59)

## 2023-05-18 LAB — CBC WITH DIFFERENTIAL/PLATELET
Basophils Absolute: 0 10*3/uL (ref 0.0–0.2)
Basos: 0 %
EOS (ABSOLUTE): 0.1 10*3/uL (ref 0.0–0.4)
Eos: 1 %
Hematocrit: 49 % (ref 37.5–51.0)
Hemoglobin: 16.4 g/dL (ref 13.0–17.7)
Immature Grans (Abs): 0 10*3/uL (ref 0.0–0.1)
Immature Granulocytes: 0 %
Lymphocytes Absolute: 0.9 10*3/uL (ref 0.7–3.1)
Lymphs: 9 %
MCH: 31.3 pg (ref 26.6–33.0)
MCHC: 33.5 g/dL (ref 31.5–35.7)
MCV: 94 fL (ref 79–97)
Monocytes Absolute: 0.8 10*3/uL (ref 0.1–0.9)
Monocytes: 8 %
Neutrophils Absolute: 7.8 10*3/uL — ABNORMAL HIGH (ref 1.4–7.0)
Neutrophils: 82 %
Platelets: 185 10*3/uL (ref 150–450)
RBC: 5.24 x10E6/uL (ref 4.14–5.80)
RDW: 13 % (ref 11.6–15.4)
WBC: 9.6 10*3/uL (ref 3.4–10.8)

## 2023-05-18 LAB — VITAMIN B12: Vitamin B-12: 435 pg/mL (ref 232–1245)

## 2023-05-18 LAB — TSH: TSH: 1.19 u[IU]/mL (ref 0.450–4.500)

## 2023-05-18 LAB — LIPID PANEL
Chol/HDL Ratio: 2.6 ratio (ref 0.0–5.0)
Cholesterol, Total: 137 mg/dL (ref 100–199)
HDL: 53 mg/dL (ref >39)
LDL Chol Calc (NIH): 70 mg/dL (ref 0–99)
Triglycerides: 69 mg/dL (ref 0–149)
VLDL Cholesterol Cal: 14 mg/dL (ref 5–40)

## 2023-05-18 LAB — HEMOGLOBIN A1C
Est. average glucose Bld gHb Est-mCnc: 140 mg/dL
Hgb A1c MFr Bld: 6.5 % — ABNORMAL HIGH (ref 4.8–5.6)

## 2023-05-18 LAB — VITAMIN D 25 HYDROXY (VIT D DEFICIENCY, FRACTURES): Vit D, 25-Hydroxy: 22.4 ng/mL — ABNORMAL LOW (ref 30.0–100.0)

## 2023-06-12 ENCOUNTER — Other Ambulatory Visit: Payer: Self-pay | Admitting: Internal Medicine

## 2023-06-14 DIAGNOSIS — Z936 Other artificial openings of urinary tract status: Secondary | ICD-10-CM | POA: Diagnosis not present

## 2023-06-21 ENCOUNTER — Ambulatory Visit: Payer: Medicare Other | Admitting: Family

## 2023-06-26 ENCOUNTER — Encounter: Payer: Self-pay | Admitting: Family

## 2023-06-26 NOTE — Assessment & Plan Note (Signed)
A1C Continues to be in prediabetic ranges.  Will reassess at follow up after next lab check.  Patient counseled on dietary choices and verbalized understanding.  Patient educated on foods that contain carbohydrates and the need to decrease intake.  We discussed prediabetes, and what it means and the need for strict dietary control to prevent progression to type 2 diabetes.  Advised to decrease intake of sugary drinks, including sodas, sweet tea, and some juices, and of starch and sugar heavy foods (ie., potatoes, rice, bread, pasta, desserts). He verbalizes understanding and agreement with the changes discussed today.  

## 2023-06-26 NOTE — Assessment & Plan Note (Signed)
Checking labs today.  Continue current therapy for lipid control. Will modify as needed based on labwork results.  

## 2023-06-26 NOTE — Assessment & Plan Note (Signed)
Blood pressure well controlled with current medications.  Continue current therapy.  Will reassess at follow up.  

## 2023-06-26 NOTE — Assessment & Plan Note (Signed)
Patient stable.  Well controlled with current therapy.   Continue current meds.  

## 2023-06-26 NOTE — Assessment & Plan Note (Signed)
Patient is seen by Nephrology, who manage this condition.  He is well controlled with current therapy.   Will defer to them for further changes to plan of care.

## 2023-06-26 NOTE — Progress Notes (Signed)
Established Patient Office Visit  Subjective:  Patient ID: Martin Jennings, male    DOB: August 15, 1950  Age: 72 y.o. MRN: [id number]  Chief Complaint  Patient presents with   Follow-up    F/U    Patient is here today for his 3 months follow up.  He has been feeling fairly well since last appointment.   He does not have additional concerns to discuss today.  Labs are due today. He needs refills.   I have reviewed his active problem list, medication list, allergies, health maintenance, notes from last encounter, lab results for his appointment today.    No other concerns at this time.   Past Medical History:  Diagnosis Date   Acute respiratory failure with hypoxia (HCC) 07/29/2022   AKI (acute kidney injury) (HCC) 04/04/2020   Bladder cancer (HCC) 2012   CAP (community acquired pneumonia) 04/04/2020   COPD suggested by initial evaluation (HCC) 10/11/2022   COVID-19 virus infection 04/03/2020   Goals of care, counseling/discussion 05/19/2020   Hypertension    Influenza A with pneumonia 07/29/2022   Leg pain 01/25/2017   Lung cancer (HCC)    Malignant neoplasm of bladder, unspecified (HCC) 12/17/2010   Peripheral vascular disease (HCC)    Sepsis (HCC) 07/29/2022    Past Surgical History:  Procedure Laterality Date   BLADDER REMOVAL     LOWER EXTREMITY ANGIOGRAPHY Left 04/12/2017   Procedure: Lower Extremity Angiography;  Surgeon: Renford Dills, MD;  Location: [facility];  Service: Cardiovascular;  Laterality: Left;   PORTA CATH INSERTION N/A 05/22/2020   Procedure: PORTA CATH INSERTION;  Surgeon: Annice Needy, MD;  Location: [facility];  Service: Cardiovascular;  Laterality: N/A;   PORTA CATH REMOVAL N/A 11/15/2022   Procedure: PORTA CATH REMOVAL;  Surgeon: Annice Needy, MD;  Location: [facility];  Service: Cardiovascular;  Laterality: N/A;   uretostomy     VASCULAR SURGERY      Social History   Socioeconomic History   Marital  status: Married    Spouse name: Not on file   Number of children: Not on file   Years of education: Not on file   Highest education level: Not on file  Occupational History   Not on file  Tobacco Use   Smoking status: Some Days    Current packs/day: 0.50    Average packs/day: 0.5 packs/day for 50.0 years (25.0 ttl pk-yrs)    Types: Cigarettes   Smokeless tobacco: Never   Tobacco comments:    Per pt. He smokes 1 pack every 3 days  Vaping Use   Vaping status: Never Used  Substance and Sexual Activity   Alcohol use: No   Drug use: Not Currently    Types: Marijuana   Sexual activity: Not Currently  Other Topics Concern   Not on file  Social History Narrative   Not on file   Social Determinants of Health   Financial Resource Strain: Not on file  Food Insecurity: No Food Insecurity (07/30/2022)   Hunger Vital Sign    Worried About Running Out of Food in the Last Year: Never true    Ran Out of Food in the Last Year: Never true  Transportation Needs: No Transportation Needs (07/30/2022)   PRAPARE - Administrator, Civil Service (Medical): No    Lack of Transportation (Non-Medical): No  Physical Activity: Not on file  Stress: Not on file  Social Connections: Not on file  Intimate Partner Violence: Not At Risk (07/30/2022)   Humiliation, Afraid, Rape, and Kick questionnaire    Fear of Current or Ex-Partner: No    Emotionally Abused: No    Physically Abused: No    Sexually Abused: No    No family history on file.  No Known Allergies  Review of Systems  Respiratory:  Positive for cough and wheezing.   All other systems reviewed and are negative.      Objective:   BP (!) 160/80   Pulse 60   Ht 5\' 11"  (1.803 m)   Wt 180 lb 12.8 oz (82 kg)   SpO2 91%   BMI 25.22 kg/m   Vitals:   05/17/23 1359  BP: (!) 160/80  Pulse: 60  Height: 5\' 11"  (1.803 m)  Weight: 180 lb 12.8 oz (82 kg)  SpO2: 91%  BMI (Calculated): 25.23    Physical Exam Vitals and nursing  note reviewed.  Constitutional:      Appearance: Normal appearance. He is normal weight.  Eyes:     Pupils: Pupils are equal, round, and reactive to light.  Cardiovascular:     Rate and Rhythm: Normal rate and regular rhythm.     Pulses: Normal pulses.     Heart sounds: Normal heart sounds.  Pulmonary:     Effort: Pulmonary effort is normal.     Breath sounds: Normal breath sounds.  Neurological:     General: No focal deficit present.     Mental Status: He is alert and oriented to person, place, and time. Mental status is at baseline.  Psychiatric:        Mood and Affect: Mood normal.        Behavior: Behavior normal.      Results for orders placed or performed in visit on 05/17/23  Lipid panel  Result Value Ref Range   Cholesterol, Total 137 100 - 199 mg/dL   Triglycerides 69 0 - 149 mg/dL   HDL 53 >46 mg/dL   VLDL Cholesterol Cal 14 5 - 40 mg/dL   LDL Chol Calc (NIH) 70 0 - 99 mg/dL   Chol/HDL Ratio 2.6 0.0 - 5.0 ratio  VITAMIN D 25 Hydroxy (Vit-D Deficiency, Fractures)  Result Value Ref Range   Vit D, 25-Hydroxy 22.4 (L) 30.0 - 100.0 ng/mL  CMP14+EGFR  Result Value Ref Range   Glucose 84 70 - 99 mg/dL   BUN 24 8 - 27 mg/dL   Creatinine, Ser 9.62 (H) 0.76 - 1.27 mg/dL   eGFR 32 (L) >95 MW/UXL/2.44   BUN/Creatinine Ratio 11 10 - 24   Sodium 144 134 - 144 mmol/L   Potassium 4.4 3.5 - 5.2 mmol/L   Chloride 104 96 - 106 mmol/L   CO2 22 20 - 29 mmol/L   Calcium 9.2 8.6 - 10.2 mg/dL   Total Protein 7.3 6.0 - 8.5 g/dL   Albumin 4.0 3.8 - 4.8 g/dL   Globulin, Total 3.3 1.5 - 4.5 g/dL   Bilirubin Total 0.7 0.0 - 1.2 mg/dL   Alkaline Phosphatase 80 44 - 121 IU/L   AST 10 0 - 40 IU/L   ALT 8 0 - 44 IU/L  TSH  Result Value Ref Range   TSH 1.190 0.450 - 4.500 uIU/mL  Hemoglobin A1c  Result Value Ref Range   Hgb A1c MFr Bld 6.5 (H) 4.8 - 5.6 %   Est. average glucose Bld gHb Est-mCnc 140 mg/dL  Vitamin W10  Result Value Ref Range   Vitamin  B-12 435 232 - 1,245 pg/mL   CBC with Diff  Result Value Ref Range   WBC 9.6 3.4 - 10.8 x10E3/uL   RBC 5.24 4.14 - 5.80 x10E6/uL   Hemoglobin 16.4 13.0 - 17.7 g/dL   Hematocrit 38.7 56.4 - 51.0 %   MCV 94 79 - 97 fL   MCH 31.3 26.6 - 33.0 pg   MCHC 33.5 31.5 - 35.7 g/dL   RDW 33.2 95.1 - 88.4 %   Platelets 185 150 - 450 x10E3/uL   Neutrophils 82 Not Estab. %   Lymphs 9 Not Estab. %   Monocytes 8 Not Estab. %   Eos 1 Not Estab. %   Basos 0 Not Estab. %   Neutrophils Absolute 7.8 (H) 1.4 - 7.0 x10E3/uL   Lymphocytes Absolute 0.9 0.7 - 3.1 x10E3/uL   Monocytes Absolute 0.8 0.1 - 0.9 x10E3/uL   EOS (ABSOLUTE) 0.1 0.0 - 0.4 x10E3/uL   Basophils Absolute 0.0 0.0 - 0.2 x10E3/uL   Immature Granulocytes 0 Not Estab. %   Immature Grans (Abs) 0.0 0.0 - 0.1 x10E3/uL    Recent Results (from the past 2160 hour(s))  Lipid panel     Status: None   Collection Time: 05/17/23  2:55 PM  Result Value Ref Range   Cholesterol, Total 137 100 - 199 mg/dL   Triglycerides 69 0 - 149 mg/dL   HDL 53 >16 mg/dL   VLDL Cholesterol Cal 14 5 - 40 mg/dL   LDL Chol Calc (NIH) 70 0 - 99 mg/dL   Chol/HDL Ratio 2.6 0.0 - 5.0 ratio    Comment:                                   T. Chol/HDL Ratio                                             Men  Women                               1/2 Avg.Risk  3.4    3.3                                   Avg.Risk  5.0    4.4                                2X Avg.Risk  9.6    7.1                                3X Avg.Risk 23.4   11.0   VITAMIN D 25 Hydroxy (Vit-D Deficiency, Fractures)     Status: Abnormal   Collection Time: 05/17/23  2:55 PM  Result Value Ref Range   Vit D, 25-Hydroxy 22.4 (L) 30.0 - 100.0 ng/mL    Comment: Vitamin D deficiency has been defined by the Institute of Medicine and an Endocrine Society practice guideline as a level of serum 25-OH vitamin D less than 20 ng/mL (1,2). The Endocrine Society went on to further define vitamin D insufficiency as  a level between 21 and 29 ng/mL  (2). 1. IOM (Institute of Medicine). 2010. Dietary reference    intakes for calcium and D. [place]: The    [employer]. 2. Holick MF, Binkley Milan, Bischoff-Ferrari HA, et al.    Evaluation, treatment, and prevention of vitamin D    deficiency: an Endocrine Society clinical practice    guideline. JCEM. 2011 Jul; 96(7):1911-30.   CMP14+EGFR     Status: Abnormal   Collection Time: 05/17/23  2:55 PM  Result Value Ref Range   Glucose 84 70 - 99 mg/dL   BUN 24 8 - 27 mg/dL   Creatinine, Ser 1.30 (H) 0.76 - 1.27 mg/dL   eGFR 32 (L) >86 VH/QIO/9.62   BUN/Creatinine Ratio 11 10 - 24   Sodium 144 134 - 144 mmol/L   Potassium 4.4 3.5 - 5.2 mmol/L   Chloride 104 96 - 106 mmol/L   CO2 22 20 - 29 mmol/L   Calcium 9.2 8.6 - 10.2 mg/dL   Total Protein 7.3 6.0 - 8.5 g/dL   Albumin 4.0 3.8 - 4.8 g/dL   Globulin, Total 3.3 1.5 - 4.5 g/dL   Bilirubin Total 0.7 0.0 - 1.2 mg/dL   Alkaline Phosphatase 80 44 - 121 IU/L   AST 10 0 - 40 IU/L   ALT 8 0 - 44 IU/L  TSH     Status: None   Collection Time: 05/17/23  2:55 PM  Result Value Ref Range   TSH 1.190 0.450 - 4.500 uIU/mL  Hemoglobin A1c     Status: Abnormal   Collection Time: 05/17/23  2:55 PM  Result Value Ref Range   Hgb A1c MFr Bld 6.5 (H) 4.8 - 5.6 %    Comment:          Prediabetes: 5.7 - 6.4          Diabetes: >6.4          Glycemic control for adults with diabetes: <7.0    Est. average glucose Bld gHb Est-mCnc 140 mg/dL  Vitamin X52     Status: None   Collection Time: 05/17/23  2:55 PM  Result Value Ref Range   Vitamin B-12 435 232 - 1,245 pg/mL  CBC with Diff     Status: Abnormal   Collection Time: 05/17/23  2:55 PM  Result Value Ref Range   WBC 9.6 3.4 - 10.8 x10E3/uL   RBC 5.24 4.14 - 5.80 x10E6/uL   Hemoglobin 16.4 13.0 - 17.7 g/dL   Hematocrit 84.1 32.4 - 51.0 %   MCV 94 79 - 97 fL   MCH 31.3 26.6 - 33.0 pg   MCHC 33.5 31.5 - 35.7 g/dL   RDW 40.1 02.7 - 25.3 %   Platelets 185 150 - 450 x10E3/uL    Neutrophils 82 Not Estab. %   Lymphs 9 Not Estab. %   Monocytes 8 Not Estab. %   Eos 1 Not Estab. %   Basos 0 Not Estab. %   Neutrophils Absolute 7.8 (H) 1.4 - 7.0 x10E3/uL   Lymphocytes Absolute 0.9 0.7 - 3.1 x10E3/uL   Monocytes Absolute 0.8 0.1 - 0.9 x10E3/uL   EOS (ABSOLUTE) 0.1 0.0 - 0.4 x10E3/uL   Basophils Absolute 0.0 0.0 - 0.2 x10E3/uL   Immature Granulocytes 0 Not Estab. %   Immature Grans (Abs) 0.0 0.0 - 0.1 x10E3/uL       Assessment & Plan:   Problem List Items Addressed This Visit  Active Problems   Essential hypertension    Blood pressure well controlled with current medications.  Continue current therapy.  Will reassess at follow up.       Relevant Orders   CBC with Diff (Completed)   Hyperlipidemia - Primary    Checking labs today.  Continue current therapy for lipid control. Will modify as needed based on labwork results.        Relevant Orders   Lipid panel (Completed)   CBC with Diff (Completed)   Chemotherapy induced neutropenia (HCC)    Patient stable.  Well controlled with current therapy.   Continue current meds.        Prediabetes    A1C Continues to be in prediabetic ranges.  Will reassess at follow up after next lab check.  Patient counseled on dietary choices and verbalized understanding.  Patient educated on foods that contain carbohydrates and the need to decrease intake.  We discussed prediabetes, and what it means and the need for strict dietary control to prevent progression to type 2 diabetes.  Advised to decrease intake of sugary drinks, including sodas, sweet tea, and some juices, and of starch and sugar heavy foods (ie., potatoes, rice, bread, pasta, desserts). He verbalizes understanding and agreement with the changes discussed today.        Relevant Orders   Hemoglobin A1c (Completed)   CBC with Diff (Completed)   Stage 3b chronic kidney disease (CKD) (HCC)    Patient is seen by Nephrology, who manage this  condition.  He is well controlled with current therapy.   Will defer to them for further changes to plan of care.      Relevant Orders   CMP14+EGFR (Completed)   CBC with Diff (Completed)   Other Visit Diagnoses     B12 deficiency due to diet       Relevant Orders   Vitamin B12 (Completed)   CBC with Diff (Completed)   Vitamin D deficiency, unspecified       Relevant Orders   VITAMIN D 25 Hydroxy (Vit-D Deficiency, Fractures) (Completed)   CBC with Diff (Completed)   Other fatigue       Relevant Orders   TSH (Completed)   CBC with Diff (Completed)       Return in about 1 month (around 06/17/2023) for AWV.   Total time spent: 30 minutes  Miki Kins, FNP  05/17/2023   This document may have been prepared by [facility] Voice Recognition software and as such may include unintentional dictation errors.

## 2023-07-26 ENCOUNTER — Ambulatory Visit: Payer: Medicare Other | Admitting: Family

## 2023-07-28 ENCOUNTER — Encounter: Payer: Self-pay | Admitting: Oncology

## 2023-08-02 ENCOUNTER — Ambulatory Visit: Payer: Medicare Other | Admitting: Family

## 2023-08-02 ENCOUNTER — Encounter: Payer: Self-pay | Admitting: Family

## 2023-08-02 VITALS — BP 150/84 | HR 88 | Ht 71.0 in | Wt 174.8 lb

## 2023-08-02 DIAGNOSIS — J069 Acute upper respiratory infection, unspecified: Secondary | ICD-10-CM | POA: Diagnosis not present

## 2023-08-02 DIAGNOSIS — I10 Essential (primary) hypertension: Secondary | ICD-10-CM

## 2023-08-02 LAB — POCT RAPID INFLUENZA A&B
INFLUENZA B:: NEGATIVE
Influenza A: NEGATIVE

## 2023-08-02 MED ORDER — ALLOPURINOL 100 MG PO TABS: 50.0000 mg | ORAL_TABLET | Freq: Every day | ORAL | 3 refills | Status: DC

## 2023-08-02 MED ORDER — CARVEDILOL 6.25 MG PO TABS: 6.2500 mg | ORAL_TABLET | Freq: Two times a day (BID) | ORAL | 3 refills | Status: DC

## 2023-08-03 LAB — COVID-19, FLU A+B AND RSV
Influenza A, NAA: NOT DETECTED
Influenza B, NAA: NOT DETECTED
RSV, NAA: NOT DETECTED
SARS-CoV-2, NAA: NOT DETECTED

## 2023-08-06 NOTE — Progress Notes (Signed)
 Established Patient Office Visit  Subjective:  Patient ID: Martin Jennings, male    DOB: [birth year]  Age: 73 y.o. MRN: [id number]  Chief Complaint  Patient presents with   Follow-up    Patient is here today for his 3 months follow up.  He has been feeling poorly since last appointment.   He does have additional concerns to discuss today.  He has been having issues with cough and congestion for about a week.  Has also had fevers and body aches.   Labs are not due today. He needs refills.   I have reviewed his active problem list, medication list, allergies, health maintenance, notes from last encounter, lab results for his appointment today.      No other concerns at this time.   Past Medical History:  Diagnosis Date   Acute respiratory failure with hypoxia (HCC) 07/29/2022   AKI (acute kidney injury) (HCC) 04/04/2020   Bladder cancer (HCC) 2012   CAP (community acquired pneumonia) 04/04/2020   COPD suggested by initial evaluation (HCC) 10/11/2022   COVID-19 virus infection 04/03/2020   Goals of care, counseling/discussion 05/19/2020   Hypertension    Influenza A with pneumonia 07/29/2022   Leg pain 01/25/2017   Lung cancer (HCC)    Malignant neoplasm of bladder, unspecified (HCC) 12/17/2010   Peripheral vascular disease (HCC)    Sepsis (HCC) 07/29/2022    Past Surgical History:  Procedure Laterality Date   BLADDER REMOVAL     LOWER EXTREMITY ANGIOGRAPHY Left 04/12/2017   Procedure: Lower Extremity Angiography;  Surgeon: Jama Cordella MATSU, MD;  Location: [facility];  Service: Cardiovascular;  Laterality: Left;   PORTA CATH INSERTION N/A 05/22/2020   Procedure: PORTA CATH INSERTION;  Surgeon: Marea Selinda RAMAN, MD;  Location: [facility];  Service: Cardiovascular;  Laterality: N/A;   PORTA CATH REMOVAL N/A 11/15/2022   Procedure: PORTA CATH REMOVAL;  Surgeon: Marea Selinda RAMAN, MD;  Location: [facility];  Service: Cardiovascular;  Laterality: N/A;    uretostomy     VASCULAR SURGERY      Social History   Socioeconomic History   Marital status: Married    Spouse name: Not on file   Number of children: Not on file   Years of education: Not on file   Highest education level: Not on file  Occupational History   Not on file  Tobacco Use   Smoking status: Some Days    Current packs/day: 0.50    Average packs/day: 0.5 packs/day for 50.0 years (25.0 ttl pk-yrs)    Types: Cigarettes   Smokeless tobacco: Never   Tobacco comments:    Per pt. He smokes 1 pack every 3 days  Vaping Use   Vaping status: Never Used  Substance and Sexual Activity   Alcohol use: No   Drug use: Not Currently    Types: Marijuana   Sexual activity: Not Currently  Other Topics Concern   Not on file  Social History Narrative   Not on file   Social Drivers of Health   Financial Resource Strain: Not on file  Food Insecurity: No Food Insecurity (07/30/2022)   Hunger Vital Sign    Worried About Running Out of Food in the Last Year: Never true    Ran Out of Food in the Last Year: Never true  Transportation Needs: No Transportation Needs (07/30/2022)   PRAPARE - Administrator, Civil Service (Medical): No    Lack of Transportation (  Non-Medical): No  Physical Activity: Not on file  Stress: Not on file  Social Connections: Not on file  Intimate Partner Violence: Not At Risk (07/30/2022)   Humiliation, Afraid, Rape, and Kick questionnaire    Fear of Current or Ex-Partner: No    Emotionally Abused: No    Physically Abused: No    Sexually Abused: No    History reviewed. No pertinent family history.  No Known Allergies  Review of Systems  HENT:  Positive for congestion and sinus pain.   Respiratory:  Positive for cough.   All other systems reviewed and are negative.      Objective:   BP (!) 150/84   Pulse 88   Ht 5' 11 (1.803 m)   Wt 174 lb 12.8 oz (79.3 kg)   SpO2 (!) 85%   BMI 24.38 kg/m   Vitals:   08/02/23 1330  BP: (!)  150/84  Pulse: 88  Height: 5' 11 (1.803 m)  Weight: 174 lb 12.8 oz (79.3 kg)  SpO2: (!) 85%  BMI (Calculated): 24.39    Physical Exam Vitals and nursing note reviewed.  Constitutional:      General: He is not in acute distress.    Appearance: Normal appearance. He is normal weight. He is ill-appearing.  HENT:     Nose: Congestion and rhinorrhea present.  Eyes:     Pupils: Pupils are equal, round, and reactive to light.  Cardiovascular:     Rate and Rhythm: Normal rate and regular rhythm.     Pulses: Normal pulses.  Pulmonary:     Effort: Pulmonary effort is normal.     Breath sounds: Wheezing present.  Neurological:     General: No focal deficit present.     Mental Status: He is alert and oriented to person, place, and time.  Psychiatric:        Mood and Affect: Mood normal.        Behavior: Behavior normal.        Thought Content: Thought content normal.        Judgment: Judgment normal.      Results for orders placed or performed in visit on 08/02/23  COVID-19, Flu A+B and RSV  Result Value Ref Range   SARS-CoV-2, NAA Not Detected Not Detected   Influenza A, NAA Not Detected Not Detected   Influenza B, NAA Not Detected Not Detected   RSV, NAA Not Detected Not Detected   Test Information: Comment   POCT Rapid Influenza A&B  Result Value Ref Range   Influenza A neg    INFLUENZA B: neg     Recent Results (from the past 2160 hours)  COVID-19, Flu A+B and RSV     Status: None   Collection Time: 08/02/23  4:09 AM  Result Value Ref Range   SARS-CoV-2, NAA Not Detected Not Detected   Influenza A, NAA Not Detected Not Detected   Influenza B, NAA Not Detected Not Detected   RSV, NAA Not Detected Not Detected   Test Information: Comment     Comment: This nucleic acid amplification test was developed and its performance characteristics determined by [employer]. Nucleic acid amplification tests include RT-PCR and TMA. This test has not been FDA cleared or  approved. This test has been authorized by FDA under an Emergency Use Authorization (EUA). This test is only authorized for the duration of time the declaration that circumstances exist justifying the authorization of the emergency use of in vitro diagnostic tests for detection  of SARS-CoV-2 virus and/or diagnosis of COVID-19 infection under section 564(b)(1) of the Act, 21 U.S.C. 639aaa-6(a) (1), unless the authorization is terminated or revoked sooner. When diagnostic testing is negative, the possibility of a false negative result should be considered in the context of a patient's recent exposures and the presence of clinical signs and symptoms consistent with COVID-19. An individual without symptoms of COVID-19 and who is not shedding SARS-CoV-2 virus wo uld expect to have a negative (not detected) result in this assay.   POCT Rapid Influenza A&B     Status: None   Collection Time: 08/02/23  3:30 PM  Result Value Ref Range   Influenza A neg    INFLUENZA B: neg        Assessment & Plan:   Problem List Items Addressed This Visit   None Visit Diagnoses       Upper respiratory tract infection, unspecified type    -  Primary   Relevant Orders   COVID-19, Flu A+B and RSV (Completed)   POCT Rapid Influenza A&B (Completed)     Checking Rapid Flu today.  Will send COVID/RSV testing to [facility]  Will call with results.   Return in about 1 month (around 09/02/2023) for F/U.   Total time spent: 20 minutes  ALAN CHRISTELLA ARRANT, FNP  08/02/2023   This document may have been prepared by [facility] Voice Recognition software and as such may include unintentional dictation errors.

## 2023-08-17 ENCOUNTER — Telehealth: Payer: Self-pay | Admitting: Family

## 2023-08-17 NOTE — Telephone Encounter (Signed)
Patient called in stating that the allopurinol 1 mg half tab daily is not helping his gout. Spoke with Marchelle Folks who advised that he increase to one tab daily. Patient notified and verbalized understanding.

## 2023-08-19 ENCOUNTER — Other Ambulatory Visit: Payer: Self-pay | Admitting: Family

## 2023-08-22 MED ORDER — ALLOPURINOL 100 MG PO TABS: 100.0000 mg | ORAL_TABLET | Freq: Two times a day (BID) | ORAL | 0 refills | Status: DC

## 2023-08-24 ENCOUNTER — Other Ambulatory Visit: Payer: Self-pay | Admitting: Family

## 2023-09-02 ENCOUNTER — Ambulatory Visit: Payer: Medicare Other | Admitting: Family

## 2023-09-21 ENCOUNTER — Other Ambulatory Visit: Payer: Self-pay

## 2023-09-21 DIAGNOSIS — Z08 Encounter for follow-up examination after completed treatment for malignant neoplasm: Secondary | ICD-10-CM

## 2023-09-23 ENCOUNTER — Inpatient Hospital Stay: Payer: Medicare Other | Attending: Oncology

## 2023-09-23 ENCOUNTER — Inpatient Hospital Stay: Payer: Medicare Other | Admitting: Oncology

## 2023-09-23 ENCOUNTER — Encounter: Payer: Self-pay | Admitting: Oncology

## 2023-09-29 ENCOUNTER — Other Ambulatory Visit: Payer: Self-pay | Admitting: Family

## 2023-09-29 DIAGNOSIS — N1832 Chronic kidney disease, stage 3b: Secondary | ICD-10-CM

## 2023-09-30 ENCOUNTER — Encounter: Payer: Self-pay | Admitting: Family

## 2023-10-06 NOTE — Addendum Note (Signed)
 Addended by: Grayling Congress on: 10/06/2023 05:18 PM   Modules accepted: Orders

## 2023-10-14 ENCOUNTER — Other Ambulatory Visit: Payer: Self-pay | Admitting: Family

## 2023-10-18 ENCOUNTER — Ambulatory Visit (INDEPENDENT_AMBULATORY_CARE_PROVIDER_SITE_OTHER): Admitting: Family

## 2023-10-18 ENCOUNTER — Encounter: Payer: Self-pay | Admitting: Family

## 2023-10-18 VITALS — BP 130/76 | HR 81 | Ht 71.0 in | Wt 172.8 lb

## 2023-10-18 DIAGNOSIS — Z1211 Encounter for screening for malignant neoplasm of colon: Secondary | ICD-10-CM

## 2023-10-18 DIAGNOSIS — I10 Essential (primary) hypertension: Secondary | ICD-10-CM | POA: Diagnosis not present

## 2023-10-18 DIAGNOSIS — R7303 Prediabetes: Secondary | ICD-10-CM | POA: Diagnosis not present

## 2023-10-18 DIAGNOSIS — Z1159 Encounter for screening for other viral diseases: Secondary | ICD-10-CM

## 2023-10-18 DIAGNOSIS — E785 Hyperlipidemia, unspecified: Secondary | ICD-10-CM | POA: Diagnosis not present

## 2023-10-18 DIAGNOSIS — N1832 Chronic kidney disease, stage 3b: Secondary | ICD-10-CM | POA: Diagnosis not present

## 2023-10-18 DIAGNOSIS — R5383 Other fatigue: Secondary | ICD-10-CM

## 2023-10-18 DIAGNOSIS — E559 Vitamin D deficiency, unspecified: Secondary | ICD-10-CM

## 2023-10-18 DIAGNOSIS — F17200 Nicotine dependence, unspecified, uncomplicated: Secondary | ICD-10-CM

## 2023-10-18 DIAGNOSIS — E538 Deficiency of other specified B group vitamins: Secondary | ICD-10-CM

## 2023-10-18 DIAGNOSIS — C3490 Malignant neoplasm of unspecified part of unspecified bronchus or lung: Secondary | ICD-10-CM | POA: Diagnosis not present

## 2023-10-18 NOTE — Patient Instructions (Addendum)
 Referral to kidney doctor sent to [facility]  Please call to set up appointment:  [phone]  Referral to GI doctor sent for colonoscopy.  Please call if you haven't heard from them within 2 weeks.  [facility] -  [phone]  Labs today - will call with results when available.

## 2023-10-18 NOTE — Progress Notes (Signed)
 Established Patient Office Visit  Subjective:  Patient ID: Martin Jennings, male    DOB: 1951/01/04  Age: 73 y.o. MRN: [id number]  Chief Complaint  Patient presents with   Follow-up    Patient is here today for his 3 months follow up.  He has been feeling fairly well since last appointment.   He does not have additional concerns to discuss today.  Needs referral to GI For colonoscopy. Had positive Cologuard a while ago, but has not followed up with GI to have colonoscopy despite me having sent referrals.   Labs are due today. He needs refills.   I have reviewed his active problem list, medication list, allergies, notes from last encounter, lab results for his appointment today.      No other concerns at this time.   Past Medical History:  Diagnosis Date   Acute respiratory failure with hypoxia (HCC) 07/29/2022   AKI (acute kidney injury) (HCC) 04/04/2020   Bladder cancer (HCC) 2012   CAP (community acquired pneumonia) 04/04/2020   COPD suggested by initial evaluation (HCC) 10/11/2022   COVID-19 virus infection 04/03/2020   Goals of care, counseling/discussion 05/19/2020   Hypertension    Influenza A with pneumonia 07/29/2022   Leg pain 01/25/2017   Lung cancer (HCC)    Malignant neoplasm of bladder, unspecified (HCC) 12/17/2010   Peripheral vascular disease (HCC)    Sepsis (HCC) 07/29/2022    Past Surgical History:  Procedure Laterality Date   BLADDER REMOVAL     LOWER EXTREMITY ANGIOGRAPHY Left 04/12/2017   Procedure: Lower Extremity Angiography;  Surgeon: Jackquelyn Mass, MD;  Location: [facility];  Service: Cardiovascular;  Laterality: Left;   PORTA CATH INSERTION N/A 05/22/2020   Procedure: PORTA CATH INSERTION;  Surgeon: Celso College, MD;  Location: [facility];  Service: Cardiovascular;  Laterality: N/A;   PORTA CATH REMOVAL N/A 11/15/2022   Procedure: PORTA CATH REMOVAL;  Surgeon: Celso College, MD;  Location: [facility];   Service: Cardiovascular;  Laterality: N/A;   uretostomy     VASCULAR SURGERY      Social History   Socioeconomic History   Marital status: Married    Spouse name: Not on file   Number of children: Not on file   Years of education: Not on file   Highest education level: Not on file  Occupational History   Not on file  Tobacco Use   Smoking status: Former    Current packs/day: 0.00    Average packs/day: 0.5 packs/day for 50.0 years (25.0 ttl pk-yrs)    Types: Cigarettes    Quit date: 08/2023    Years since quitting: 0.3   Smokeless tobacco: Never   Tobacco comments:    Per pt. He smokes 1 pack every 3 days  Vaping Use   Vaping status: Never Used  Substance and Sexual Activity   Alcohol use: No   Drug use: Not Currently    Types: Marijuana   Sexual activity: Not Currently  Other Topics Concern   Not on file  Social History Narrative   Not on file   Social Drivers of Health   Financial Resource Strain: Not on file  Food Insecurity: No Food Insecurity (07/30/2022)   Hunger Vital Sign    Worried About Running Out of Food in the Last Year: Never true    Ran Out of Food in the Last Year: Never true  Transportation Needs: No Transportation Needs (07/30/2022)  PRAPARE - Administrator, Civil Service (Medical): No    Lack of Transportation (Non-Medical): No  Physical Activity: Not on file  Stress: Not on file  Social Connections: Not on file  Intimate Partner Violence: Not At Risk (07/30/2022)   Humiliation, Afraid, Rape, and Kick questionnaire    Fear of Current or Ex-Partner: No    Emotionally Abused: No    Physically Abused: No    Sexually Abused: No    History reviewed. No pertinent family history.  No Known Allergies  Review of Systems  All other systems reviewed and are negative.      Objective:   BP 130/76   Pulse 81   Ht 5\' 11"  (1.803 m)   Wt 172 lb 12.8 oz (78.4 kg)   SpO2 91%   BMI 24.10 kg/m   Vitals:   10/18/23 1313  BP: 130/76   Pulse: 81  Height: 5\' 11"  (1.803 m)  Weight: 172 lb 12.8 oz (78.4 kg)  SpO2: 91%  BMI (Calculated): 24.11    Physical Exam Vitals and nursing note reviewed.  Constitutional:      Appearance: Normal appearance. He is normal weight.  Eyes:     Pupils: Pupils are equal, round, and reactive to light.  Cardiovascular:     Rate and Rhythm: Normal rate and regular rhythm.     Pulses: Normal pulses.     Heart sounds: Normal heart sounds.  Pulmonary:     Effort: Pulmonary effort is normal.     Breath sounds: Normal breath sounds.  Musculoskeletal:        General: Normal range of motion.  Neurological:     General: No focal deficit present.     Mental Status: He is alert and oriented to person, place, and time. Mental status is at baseline.  Psychiatric:        Mood and Affect: Mood normal.        Behavior: Behavior normal.        Thought Content: Thought content normal.        Judgment: Judgment normal.      Results for orders placed or performed in visit on 10/18/23  Lipid panel  Result Value Ref Range   Cholesterol, Total 138 100 - 199 mg/dL   Triglycerides 161 0 - 149 mg/dL   HDL 50 >09 mg/dL   VLDL Cholesterol Cal 20 5 - 40 mg/dL   LDL Chol Calc (NIH) 68 0 - 99 mg/dL   Chol/HDL Ratio 2.8 0.0 - 5.0 ratio  VITAMIN D  25 Hydroxy (Vit-D Deficiency, Fractures)  Result Value Ref Range   Vit D, 25-Hydroxy 46.5 30.0 - 100.0 ng/mL  CMP14+EGFR  Result Value Ref Range   Glucose 137 (H) 70 - 99 mg/dL   BUN 21 8 - 27 mg/dL   Creatinine, Ser 6.04 0.76 - 1.27 mg/dL   eGFR 61 >54 UJ/WJX/9.14   BUN/Creatinine Ratio 17 10 - 24   Sodium 140 134 - 144 mmol/L   Potassium 4.1 3.5 - 5.2 mmol/L   Chloride 99 96 - 106 mmol/L   CO2 24 20 - 29 mmol/L   Calcium  9.8 8.6 - 10.2 mg/dL   Total Protein 6.8 6.0 - 8.5 g/dL   Albumin  4.5 3.8 - 4.8 g/dL   Globulin, Total 2.3 1.5 - 4.5 g/dL   Bilirubin Total 0.3 0.0 - 1.2 mg/dL   Alkaline Phosphatase 113 44 - 121 IU/L   AST 28 0 - 40 IU/L   ALT  29 0 - 44 IU/L  TSH  Result Value Ref Range   TSH 1.610 0.450 - 4.500 uIU/mL  Hemoglobin A1c  Result Value Ref Range   Hgb A1c MFr Bld 6.7 (H) 4.8 - 5.6 %   Est. average glucose Bld gHb Est-mCnc 146 mg/dL  Vitamin B12  Result Value Ref Range   Vitamin B-12 1,020 232 - 1,245 pg/mL  Hepatitis C Ab reflex to Quant PCR  Result Value Ref Range   HCV Ab Non Reactive Non Reactive  Interpretation:  Result Value Ref Range   HCV Interp 1: Comment     Recent Results (from the past 2160 hours)  Lipid panel     Status: None   Collection Time: 10/18/23  2:11 PM  Result Value Ref Range   Cholesterol, Total 138 100 - 199 mg/dL   Triglycerides 161 0 - 149 mg/dL   HDL 50 >09 mg/dL   VLDL Cholesterol Cal 20 5 - 40 mg/dL   LDL Chol Calc (NIH) 68 0 - 99 mg/dL   Chol/HDL Ratio 2.8 0.0 - 5.0 ratio    Comment:                                   T. Chol/HDL Ratio                                             Men  Women                               1/2 Avg.Risk  3.4    3.3                                   Avg.Risk  5.0    4.4                                2X Avg.Risk  9.6    7.1                                3X Avg.Risk 23.4   11.0   VITAMIN D  25 Hydroxy (Vit-D Deficiency, Fractures)     Status: None   Collection Time: 10/18/23  2:11 PM  Result Value Ref Range   Vit D, 25-Hydroxy 46.5 30.0 - 100.0 ng/mL    Comment: Vitamin D  deficiency has been defined by the Institute of Medicine and an Endocrine Society practice guideline as a level of serum 25-OH vitamin D  less than 20 ng/mL (1,2). The Endocrine Society went on to further define vitamin D  insufficiency as a level between 21 and 29 ng/mL (2). 1. IOM (Institute of Medicine). 2010. Dietary reference    intakes for calcium  and D. [place]: The    [employer]. 2. Holick MF, Binkley [place], Bischoff-Ferrari HA, et al.    Evaluation, treatment, and prevention of vitamin D     deficiency: an Endocrine Society clinical practice     guideline. JCEM. 2011 Jul; 96(7):1911-30.   CMP14+EGFR     Status: Abnormal   Collection Time: 10/18/23  2:11  PM  Result Value Ref Range   Glucose 137 (H) 70 - 99 mg/dL   BUN 21 8 - 27 mg/dL   Creatinine, Ser 1.61 0.76 - 1.27 mg/dL   eGFR 61 >09 UE/AVW/0.98   BUN/Creatinine Ratio 17 10 - 24   Sodium 140 134 - 144 mmol/L   Potassium 4.1 3.5 - 5.2 mmol/L   Chloride 99 96 - 106 mmol/L   CO2 24 20 - 29 mmol/L   Calcium  9.8 8.6 - 10.2 mg/dL   Total Protein 6.8 6.0 - 8.5 g/dL   Albumin  4.5 3.8 - 4.8 g/dL   Globulin, Total 2.3 1.5 - 4.5 g/dL   Bilirubin Total 0.3 0.0 - 1.2 mg/dL   Alkaline Phosphatase 113 44 - 121 IU/L   AST 28 0 - 40 IU/L   ALT 29 0 - 44 IU/L  TSH     Status: None   Collection Time: 10/18/23  2:11 PM  Result Value Ref Range   TSH 1.610 0.450 - 4.500 uIU/mL  Hemoglobin A1c     Status: Abnormal   Collection Time: 10/18/23  2:11 PM  Result Value Ref Range   Hgb A1c MFr Bld 6.7 (H) 4.8 - 5.6 %    Comment:          Prediabetes: 5.7 - 6.4          Diabetes: >6.4          Glycemic control for adults with diabetes: <7.0    Est. average glucose Bld gHb Est-mCnc 146 mg/dL  Vitamin B12     Status: None   Collection Time: 10/18/23  2:11 PM  Result Value Ref Range   Vitamin B-12 1,020 232 - 1,245 pg/mL  Hepatitis C Ab reflex to Quant PCR     Status: None   Collection Time: 10/18/23  2:11 PM  Result Value Ref Range   HCV Ab Non Reactive Non Reactive  Interpretation:     Status: None   Collection Time: 10/18/23  2:11 PM  Result Value Ref Range   HCV Interp 1: Comment     Comment: Not infected with HCV unless early or acute infection is suspected (which may be delayed in an immunocompromised individual), or other evidence exists to indicate HCV infection.        Assessment & Plan:   Problem List Items Addressed This Visit       Cardiovascular and Mediastinum   Essential hypertension   Blood pressure well controlled with current medications.  Continue current  therapy.  Will reassess at follow up.        Relevant Orders   CMP14+EGFR (Completed)   CBC with Differential/Platelet     Respiratory   Malignant neoplasm of lung [facility])   Patient is seen by Oncology, who manage this condition.  He is well controlled with current therapy.   Will defer to them for further changes to plan of care.          Genitourinary   Stage 3b chronic kidney disease (CKD) (HCC) - Primary   Giving patient number to contact [facility] again, as referral has been sent.  Will defer to them for further treatment.      Relevant Orders   CMP14+EGFR (Completed)   CBC with Differential/Platelet     Other   Tobacco use disorder   Relevant Orders   CMP14+EGFR (Completed)   CBC with Differential/Platelet   Hyperlipidemia   Checking labs today.  Continue current therapy for lipid control. Will  modify as needed based on labwork results.        Relevant Orders   Lipid panel (Completed)   CMP14+EGFR (Completed)   CBC with Differential/Platelet   Prediabetes   Patient educated on foods that contain carbohydrates and the need to decrease intake.  We discussed prediabetes, and what it means and the need for strict dietary control to prevent progression to type 2 diabetes.  Advised to decrease intake of sugary drinks, including sodas, sweet tea, and some juices, and of starch and sugar heavy foods (ie., potatoes, rice, bread, pasta, desserts). He verbalizes understanding and agreement with the changes discussed today.  A1C Continues to be in prediabetic ranges.  Will reassess at follow up after next lab check.  Patient counseled on dietary choices and verbalized understanding.        Relevant Orders   CMP14+EGFR (Completed)   Hemoglobin A1c (Completed)   CBC with Differential/Platelet   Other Visit Diagnoses       Vitamin D  deficiency, unspecified       Checking labs today.  Will continue supplements as needed.   Relevant Orders   VITAMIN D  25 Hydroxy (Vit-D  Deficiency, Fractures) (Completed)   CMP14+EGFR (Completed)   CBC with Differential/Platelet     B12 deficiency due to diet       Checking labs today.  Will continue supplements as needed.   Relevant Orders   CMP14+EGFR (Completed)   Vitamin B12 (Completed)   CBC with Differential/Platelet     Need for hepatitis C screening test       Relevant Orders   CMP14+EGFR (Completed)   CBC with Differential/Platelet   Hepatitis C Ab reflex to Quant PCR (Completed)     Other fatigue       Checking labs today.  Will continue supplements as needed.   Relevant Orders   CMP14+EGFR (Completed)   TSH (Completed)     Encounter for screening colonoscopy       Relevant Orders   Ambulatory referral to Gastroenterology       Return in about 4 months (around 02/17/2024).   Total time spent: 30 minutes  Trenda Frisk, FNP  10/18/2023   This document may have been prepared by [facility] Voice Recognition software and as such may include unintentional dictation errors.

## 2023-10-19 LAB — LIPID PANEL
Chol/HDL Ratio: 2.8 ratio (ref 0.0–5.0)
Cholesterol, Total: 138 mg/dL (ref 100–199)
HDL: 50 mg/dL (ref >39)
LDL Chol Calc (NIH): 68 mg/dL (ref 0–99)
Triglycerides: 110 mg/dL (ref 0–149)
VLDL Cholesterol Cal: 20 mg/dL (ref 5–40)

## 2023-10-19 LAB — CMP14+EGFR
ALT: 29 IU/L (ref 0–44)
AST: 28 IU/L (ref 0–40)
Albumin: 4.5 g/dL (ref 3.8–4.8)
Alkaline Phosphatase: 113 IU/L (ref 44–121)
BUN/Creatinine Ratio: 17 (ref 10–24)
BUN: 21 mg/dL (ref 8–27)
Bilirubin Total: 0.3 mg/dL (ref 0.0–1.2)
CO2: 24 mmol/L (ref 20–29)
Calcium: 9.8 mg/dL (ref 8.6–10.2)
Chloride: 99 mmol/L (ref 96–106)
Creatinine, Ser: 1.25 mg/dL (ref 0.76–1.27)
Globulin, Total: 2.3 g/dL (ref 1.5–4.5)
Glucose: 137 mg/dL — ABNORMAL HIGH (ref 70–99)
Potassium: 4.1 mmol/L (ref 3.5–5.2)
Sodium: 140 mmol/L (ref 134–144)
Total Protein: 6.8 g/dL (ref 6.0–8.5)
eGFR: 61 mL/min/{1.73_m2} (ref >59)

## 2023-10-19 LAB — VITAMIN B12: Vitamin B-12: 1020 pg/mL (ref 232–1245)

## 2023-10-19 LAB — TSH: TSH: 1.61 u[IU]/mL (ref 0.450–4.500)

## 2023-10-19 LAB — HCV AB W REFLEX TO QUANT PCR: HCV Ab: NONREACTIVE

## 2023-10-19 LAB — VITAMIN D 25 HYDROXY (VIT D DEFICIENCY, FRACTURES): Vit D, 25-Hydroxy: 46.5 ng/mL (ref 30.0–100.0)

## 2023-10-19 LAB — HEMOGLOBIN A1C
Est. average glucose Bld gHb Est-mCnc: 146 mg/dL
Hgb A1c MFr Bld: 6.7 % — ABNORMAL HIGH (ref 4.8–5.6)

## 2023-10-19 LAB — HCV INTERPRETATION

## 2023-11-04 ENCOUNTER — Other Ambulatory Visit: Payer: Self-pay | Admitting: Internal Medicine

## 2023-11-07 ENCOUNTER — Other Ambulatory Visit: Payer: Self-pay | Admitting: Family

## 2023-12-17 ENCOUNTER — Encounter: Payer: Self-pay | Admitting: Family

## 2023-12-17 NOTE — Assessment & Plan Note (Signed)
 Giving patient number to contact [facility] again, as referral has been sent.  Will defer to them for further treatment.

## 2023-12-17 NOTE — Assessment & Plan Note (Signed)
Patient educated on foods that contain carbohydrates and the need to decrease intake.  We discussed prediabetes, and what it means and the need for strict dietary control to prevent progression to type 2 diabetes.  Advised to decrease intake of sugary drinks, including sodas, sweet tea, and some juices, and of starch and sugar heavy foods (ie., potatoes, rice, bread, pasta, desserts). He verbalizes understanding and agreement with the changes discussed today.   A1C Continues to be in prediabetic ranges.  Will reassess at follow up after next lab check.  Patient counseled on dietary choices and verbalized understanding.

## 2023-12-17 NOTE — Assessment & Plan Note (Signed)
 Patient is seen by Oncology, who manage this condition.  He is well controlled with current therapy.   Will defer to them for further changes to plan of care.

## 2023-12-17 NOTE — Assessment & Plan Note (Signed)
 Blood pressure well controlled with current medications.  Continue current therapy.  Will reassess at follow up.

## 2023-12-17 NOTE — Assessment & Plan Note (Signed)
 Checking labs today.  Continue current therapy for lipid control. Will modify as needed based on labwork results.

## 2024-01-18 ENCOUNTER — Encounter: Payer: Self-pay | Admitting: Family

## 2024-01-18 ENCOUNTER — Ambulatory Visit (INDEPENDENT_AMBULATORY_CARE_PROVIDER_SITE_OTHER): Admitting: Family

## 2024-01-18 DIAGNOSIS — E785 Hyperlipidemia, unspecified: Secondary | ICD-10-CM | POA: Diagnosis not present

## 2024-01-18 DIAGNOSIS — R7303 Prediabetes: Secondary | ICD-10-CM

## 2024-01-18 DIAGNOSIS — R5383 Other fatigue: Secondary | ICD-10-CM

## 2024-01-18 DIAGNOSIS — Z1159 Encounter for screening for other viral diseases: Secondary | ICD-10-CM

## 2024-01-18 DIAGNOSIS — E538 Deficiency of other specified B group vitamins: Secondary | ICD-10-CM

## 2024-01-18 DIAGNOSIS — I10 Essential (primary) hypertension: Secondary | ICD-10-CM | POA: Diagnosis not present

## 2024-01-18 DIAGNOSIS — F17200 Nicotine dependence, unspecified, uncomplicated: Secondary | ICD-10-CM

## 2024-01-18 DIAGNOSIS — C3490 Malignant neoplasm of unspecified part of unspecified bronchus or lung: Secondary | ICD-10-CM | POA: Diagnosis not present

## 2024-01-18 DIAGNOSIS — N1832 Chronic kidney disease, stage 3b: Secondary | ICD-10-CM | POA: Diagnosis not present

## 2024-01-18 DIAGNOSIS — E559 Vitamin D deficiency, unspecified: Secondary | ICD-10-CM | POA: Diagnosis not present

## 2024-01-18 MED ORDER — TRELEGY ELLIPTA 100-62.5-25 MCG/ACT IN AEPB: 1.0000 | INHALATION_SPRAY | Freq: Every day | RESPIRATORY_TRACT | 1 refills | Status: DC

## 2024-01-18 MED ORDER — ALBUTEROL SULFATE HFA 108 (90 BASE) MCG/ACT IN AERS: 2.0000 | INHALATION_SPRAY | Freq: Four times a day (QID) | RESPIRATORY_TRACT | 2 refills | Status: AC | PRN

## 2024-01-19 LAB — VITAMIN D 25 HYDROXY (VIT D DEFICIENCY, FRACTURES): Vit D, 25-Hydroxy: 22.1 ng/mL — ABNORMAL LOW (ref 30.0–100.0)

## 2024-01-19 LAB — LIPID PANEL
Chol/HDL Ratio: 3.5 ratio (ref 0.0–5.0)
Cholesterol, Total: 124 mg/dL (ref 100–199)
HDL: 35 mg/dL — ABNORMAL LOW (ref >39)
LDL Chol Calc (NIH): 76 mg/dL (ref 0–99)
Triglycerides: 58 mg/dL (ref 0–149)
VLDL Cholesterol Cal: 13 mg/dL (ref 5–40)

## 2024-01-19 LAB — HEMOGLOBIN A1C
Est. average glucose Bld gHb Est-mCnc: 131 mg/dL
Hgb A1c MFr Bld: 6.2 % — ABNORMAL HIGH (ref 4.8–5.6)

## 2024-01-19 LAB — CBC WITH DIFFERENTIAL/PLATELET
Basophils Absolute: 0 10*3/uL (ref 0.0–0.2)
Basos: 0 %
EOS (ABSOLUTE): 0.2 10*3/uL (ref 0.0–0.4)
Eos: 3 %
Hematocrit: 45.8 % (ref 37.5–51.0)
Hemoglobin: 14.5 g/dL (ref 13.0–17.7)
Immature Grans (Abs): 0 10*3/uL (ref 0.0–0.1)
Immature Granulocytes: 0 %
Lymphocytes Absolute: 1 10*3/uL (ref 0.7–3.1)
Lymphs: 17 %
MCH: 31.1 pg (ref 26.6–33.0)
MCHC: 31.7 g/dL (ref 31.5–35.7)
MCV: 98 fL — ABNORMAL HIGH (ref 79–97)
Monocytes Absolute: 0.5 10*3/uL (ref 0.1–0.9)
Monocytes: 8 %
Neutrophils Absolute: 4.2 10*3/uL (ref 1.4–7.0)
Neutrophils: 72 %
Platelets: 242 10*3/uL (ref 150–450)
RBC: 4.66 x10E6/uL (ref 4.14–5.80)
RDW: 13.2 % (ref 11.6–15.4)
WBC: 5.8 10*3/uL (ref 3.4–10.8)

## 2024-01-19 LAB — CMP14+EGFR
ALT: 12 IU/L (ref 0–44)
AST: 12 IU/L (ref 0–40)
Albumin: 3.7 g/dL — ABNORMAL LOW (ref 3.8–4.8)
Alkaline Phosphatase: 84 IU/L (ref 44–121)
BUN/Creatinine Ratio: 13 (ref 10–24)
BUN: 30 mg/dL — ABNORMAL HIGH (ref 8–27)
Bilirubin Total: 0.3 mg/dL (ref 0.0–1.2)
CO2: 16 mmol/L — ABNORMAL LOW (ref 20–29)
Calcium: 8.8 mg/dL (ref 8.6–10.2)
Chloride: 107 mmol/L — ABNORMAL HIGH (ref 96–106)
Creatinine, Ser: 2.35 mg/dL — ABNORMAL HIGH (ref 0.76–1.27)
Globulin, Total: 3.5 g/dL (ref 1.5–4.5)
Glucose: 131 mg/dL — ABNORMAL HIGH (ref 70–99)
Potassium: 4 mmol/L (ref 3.5–5.2)
Sodium: 141 mmol/L (ref 134–144)
Total Protein: 7.2 g/dL (ref 6.0–8.5)
eGFR: 29 mL/min/{1.73_m2} — ABNORMAL LOW (ref >59)

## 2024-01-19 LAB — TSH: TSH: 1.67 u[IU]/mL (ref 0.450–4.500)

## 2024-01-19 LAB — VITAMIN B12: Vitamin B-12: 397 pg/mL (ref 232–1245)

## 2024-01-20 ENCOUNTER — Other Ambulatory Visit: Payer: Self-pay

## 2024-01-20 DIAGNOSIS — N1832 Chronic kidney disease, stage 3b: Secondary | ICD-10-CM

## 2024-01-29 ENCOUNTER — Encounter: Payer: Self-pay | Admitting: Family

## 2024-01-29 NOTE — Assessment & Plan Note (Signed)
 Have referred pt to nephrology previously, will find another to refer to if his labs still consistent with CKD.

## 2024-01-29 NOTE — Assessment & Plan Note (Signed)
 Patient is seen by Oncology, who manage this condition.  He is well controlled with current therapy.   Will defer to them for further changes to plan of care.

## 2024-01-29 NOTE — Assessment & Plan Note (Signed)
 Blood pressure well controlled with current medications.  Continue current therapy.  Will reassess at follow up.   - CBC w/Diff - CMP w/eGFR

## 2024-01-29 NOTE — Assessment & Plan Note (Signed)
 Checking labs today.  Continue current therapy for lipid control. Will modify as needed based on labwork results.   -CMP w/eGFR -Lipid Panel

## 2024-01-29 NOTE — Assessment & Plan Note (Signed)
 A1C Continues to be in prediabetic ranges.  Will reassess at follow up after next lab check.  Patient counseled on dietary choices and verbalized understanding.   -CBC w/Diff -CMP w/eGFR -Hemoglobin A1C

## 2024-01-29 NOTE — Progress Notes (Signed)
 Established Patient Office Visit  Subjective:  Patient ID: Martin Jennings, male    DOB: 31-Oct-1950  Age: 73 y.o. MRN: [id number]  Chief Complaint  Patient presents with   Follow-up    4 month    Patient is here today for his 4 months follow up.  He has been feeling fairly well since last appointment.   He does not have additional concerns to discuss today.  Labs are due today. He needs refills.   I have reviewed his active problem list, medication list, allergies, health maintenance, notes from last encounter, lab results for his appointment today.      No other concerns at this time.   Past Medical History:  Diagnosis Date   Acute respiratory failure with hypoxia (HCC) 07/29/2022   AKI (acute kidney injury) (HCC) 04/04/2020   Bladder cancer (HCC) 2012   CAP (community acquired pneumonia) 04/04/2020   COPD suggested by initial evaluation (HCC) 10/11/2022   COVID-19 virus infection 04/03/2020   Goals of care, counseling/discussion 05/19/2020   Hypertension    Influenza A with pneumonia 07/29/2022   Leg pain 01/25/2017   Lung cancer (HCC)    Malignant neoplasm of bladder, unspecified (HCC) 12/17/2010   Peripheral vascular disease (HCC)    Sepsis (HCC) 07/29/2022    Past Surgical History:  Procedure Laterality Date   BLADDER REMOVAL     LOWER EXTREMITY ANGIOGRAPHY Left 04/12/2017   Procedure: Lower Extremity Angiography;  Surgeon: Jama Cordella MATSU, MD;  Location: [facility];  Service: Cardiovascular;  Laterality: Left;   PORTA CATH INSERTION N/A 05/22/2020   Procedure: PORTA CATH INSERTION;  Surgeon: Marea Selinda RAMAN, MD;  Location: [facility];  Service: Cardiovascular;  Laterality: N/A;   PORTA CATH REMOVAL N/A 11/15/2022   Procedure: PORTA CATH REMOVAL;  Surgeon: Marea Selinda RAMAN, MD;  Location: [facility];  Service: Cardiovascular;  Laterality: N/A;   uretostomy     VASCULAR SURGERY      Social History   Socioeconomic History    Marital status: Married    Spouse name: Not on file   Number of children: Not on file   Years of education: Not on file   Highest education level: Not on file  Occupational History   Not on file  Tobacco Use   Smoking status: Former    Current packs/day: 0.00    Average packs/day: 0.5 packs/day for 50.0 years (25.0 ttl pk-yrs)    Types: Cigarettes    Quit date: 08/2023    Years since quitting: 0.4   Smokeless tobacco: Never   Tobacco comments:    Per pt. He smokes 1 pack every 3 days  Vaping Use   Vaping status: Never Used  Substance and Sexual Activity   Alcohol use: No   Drug use: Not Currently    Types: Marijuana   Sexual activity: Not Currently  Other Topics Concern   Not on file  Social History Narrative   Not on file   Social Drivers of Health   Financial Resource Strain: Not on file  Food Insecurity: No Food Insecurity (07/30/2022)   Hunger Vital Sign    Worried About Running Out of Food in the Last Year: Never true    Ran Out of Food in the Last Year: Never true  Transportation Needs: No Transportation Needs (07/30/2022)   PRAPARE - Administrator, Civil Service (Medical): No    Lack of Transportation (Non-Medical): No  Physical Activity:  Not on file  Stress: Not on file  Social Connections: Not on file  Intimate Partner Violence: Not At Risk (07/30/2022)   Humiliation, Afraid, Rape, and Kick questionnaire    Fear of Current or Ex-Partner: No    Emotionally Abused: No    Physically Abused: No    Sexually Abused: No    History reviewed. No pertinent family history.  No Known Allergies  Review of Systems  All other systems reviewed and are negative.      Objective:   BP 120/80   Pulse 78   Ht 5' 11 (1.803 m)   Wt 173 lb 3.2 oz (78.6 kg)   SpO2 92%   BMI 24.16 kg/m   Vitals:   01/18/24 1312  BP: 120/80  Pulse: 78  Height: 5' 11 (1.803 m)  Weight: 173 lb 3.2 oz (78.6 kg)  SpO2: 92%  BMI (Calculated): 24.17    Physical  Exam Vitals and nursing note reviewed.  Constitutional:      Appearance: Normal appearance. He is normal weight.  Eyes:     Extraocular Movements: Extraocular movements intact.     Conjunctiva/sclera: Conjunctivae normal.     Pupils: Pupils are equal, round, and reactive to light.  Cardiovascular:     Rate and Rhythm: Normal rate and regular rhythm.     Pulses: Normal pulses.     Heart sounds: Normal heart sounds.  Pulmonary:     Effort: Pulmonary effort is normal.     Breath sounds: Normal breath sounds.  Musculoskeletal:        General: Normal range of motion.     Cervical back: Normal range of motion.  Skin:    General: Skin is warm.  Neurological:     General: No focal deficit present.     Mental Status: He is alert and oriented to person, place, and time. Mental status is at baseline.  Psychiatric:        Mood and Affect: Mood normal.        Behavior: Behavior normal.        Thought Content: Thought content normal.        Judgment: Judgment normal.      Results for orders placed or performed in visit on 01/18/24  CMP14+EGFR  Result Value Ref Range   Glucose 131 (H) 70 - 99 mg/dL   BUN 30 (H) 8 - 27 mg/dL   Creatinine, Ser 7.64 (H) 0.76 - 1.27 mg/dL   eGFR 29 (L) >40 fO/fpw/8.26   BUN/Creatinine Ratio 13 10 - 24   Sodium 141 134 - 144 mmol/L   Potassium 4.0 3.5 - 5.2 mmol/L   Chloride 107 (H) 96 - 106 mmol/L   CO2 16 (L) 20 - 29 mmol/L   Calcium  8.8 8.6 - 10.2 mg/dL   Total Protein 7.2 6.0 - 8.5 g/dL   Albumin  3.7 (L) 3.8 - 4.8 g/dL   Globulin, Total 3.5 1.5 - 4.5 g/dL   Bilirubin Total 0.3 0.0 - 1.2 mg/dL   Alkaline Phosphatase 84 44 - 121 IU/L   AST 12 0 - 40 IU/L   ALT 12 0 - 44 IU/L  Lipid panel  Result Value Ref Range   Cholesterol, Total 124 100 - 199 mg/dL   Triglycerides 58 0 - 149 mg/dL   HDL 35 (L) >60 mg/dL   VLDL Cholesterol Cal 13 5 - 40 mg/dL   LDL Chol Calc (NIH) 76 0 - 99 mg/dL   Chol/HDL Ratio 3.5 0.0 -  5.0 ratio  VITAMIN D  25 Hydroxy  (Vit-D Deficiency, Fractures)  Result Value Ref Range   Vit D, 25-Hydroxy 22.1 (L) 30.0 - 100.0 ng/mL  Vitamin B12  Result Value Ref Range   Vitamin B-12 397 232 - 1,245 pg/mL  Hemoglobin A1c  Result Value Ref Range   Hgb A1c MFr Bld 6.2 (H) 4.8 - 5.6 %   Est. average glucose Bld gHb Est-mCnc 131 mg/dL  TSH  Result Value Ref Range   TSH 1.670 0.450 - 4.500 uIU/mL  CBC with Diff  Result Value Ref Range   WBC 5.8 3.4 - 10.8 x10E3/uL   RBC 4.66 4.14 - 5.80 x10E6/uL   Hemoglobin 14.5 13.0 - 17.7 g/dL   Hematocrit 54.1 62.4 - 51.0 %   MCV 98 (H) 79 - 97 fL   MCH 31.1 26.6 - 33.0 pg   MCHC 31.7 31.5 - 35.7 g/dL   RDW 86.7 88.3 - 84.5 %   Platelets 242 150 - 450 x10E3/uL   Neutrophils 72 Not Estab. %   Lymphs 17 Not Estab. %   Monocytes 8 Not Estab. %   Eos 3 Not Estab. %   Basos 0 Not Estab. %   Neutrophils Absolute 4.2 1.4 - 7.0 x10E3/uL   Lymphocytes Absolute 1.0 0.7 - 3.1 x10E3/uL   Monocytes Absolute 0.5 0.1 - 0.9 x10E3/uL   EOS (ABSOLUTE) 0.2 0.0 - 0.4 x10E3/uL   Basophils Absolute 0.0 0.0 - 0.2 x10E3/uL   Immature Granulocytes 0 Not Estab. %   Immature Grans (Abs) 0.0 0.0 - 0.1 x10E3/uL    Recent Results (from the past 2160 hours)  CMP14+EGFR     Status: Abnormal   Collection Time: 01/18/24  2:08 PM  Result Value Ref Range   Glucose 131 (H) 70 - 99 mg/dL   BUN 30 (H) 8 - 27 mg/dL   Creatinine, Ser 7.64 (H) 0.76 - 1.27 mg/dL   eGFR 29 (L) >40 fO/fpw/8.26   BUN/Creatinine Ratio 13 10 - 24   Sodium 141 134 - 144 mmol/L   Potassium 4.0 3.5 - 5.2 mmol/L   Chloride 107 (H) 96 - 106 mmol/L   CO2 16 (L) 20 - 29 mmol/L   Calcium  8.8 8.6 - 10.2 mg/dL   Total Protein 7.2 6.0 - 8.5 g/dL   Albumin  3.7 (L) 3.8 - 4.8 g/dL   Globulin, Total 3.5 1.5 - 4.5 g/dL   Bilirubin Total 0.3 0.0 - 1.2 mg/dL   Alkaline Phosphatase 84 44 - 121 IU/L   AST 12 0 - 40 IU/L   ALT 12 0 - 44 IU/L  Lipid panel     Status: Abnormal   Collection Time: 01/18/24  2:08 PM  Result Value Ref Range    Cholesterol, Total 124 100 - 199 mg/dL   Triglycerides 58 0 - 149 mg/dL   HDL 35 (L) >60 mg/dL   VLDL Cholesterol Cal 13 5 - 40 mg/dL   LDL Chol Calc (NIH) 76 0 - 99 mg/dL   Chol/HDL Ratio 3.5 0.0 - 5.0 ratio    Comment:                                   T. Chol/HDL Ratio  Men  Women                               1/2 Avg.Risk  3.4    3.3                                   Avg.Risk  5.0    4.4                                2X Avg.Risk  9.6    7.1                                3X Avg.Risk 23.4   11.0   VITAMIN D  25 Hydroxy (Vit-D Deficiency, Fractures)     Status: Abnormal   Collection Time: 01/18/24  2:08 PM  Result Value Ref Range   Vit D, 25-Hydroxy 22.1 (L) 30.0 - 100.0 ng/mL    Comment: Vitamin D  deficiency has been defined by the Institute of Medicine and an Endocrine Society practice guideline as a level of serum 25-OH vitamin D  less than 20 ng/mL (1,2). The Endocrine Society went on to further define vitamin D  insufficiency as a level between 21 and 29 ng/mL (2). 1. IOM (Institute of Medicine). 2010. Dietary reference    intakes for calcium  and D. [place]: The    [employer]. 2. Holick MF, Binkley McIntosh, Bischoff-Ferrari HA, et al.    Evaluation, treatment, and prevention of vitamin D     deficiency: an Endocrine Society clinical practice    guideline. JCEM. 2011 Jul; 96(7):1911-30.   Vitamin B12     Status: None   Collection Time: 01/18/24  2:08 PM  Result Value Ref Range   Vitamin B-12 397 232 - 1,245 pg/mL  Hemoglobin A1c     Status: Abnormal   Collection Time: 01/18/24  2:08 PM  Result Value Ref Range   Hgb A1c MFr Bld 6.2 (H) 4.8 - 5.6 %    Comment:          Prediabetes: 5.7 - 6.4          Diabetes: >6.4          Glycemic control for adults with diabetes: <7.0    Est. average glucose Bld gHb Est-mCnc 131 mg/dL  TSH     Status: None   Collection Time: 01/18/24  2:08 PM  Result Value Ref Range   TSH  1.670 0.450 - 4.500 uIU/mL  CBC with Diff     Status: Abnormal   Collection Time: 01/18/24  2:08 PM  Result Value Ref Range   WBC 5.8 3.4 - 10.8 x10E3/uL   RBC 4.66 4.14 - 5.80 x10E6/uL   Hemoglobin 14.5 13.0 - 17.7 g/dL   Hematocrit 54.1 62.4 - 51.0 %   MCV 98 (H) 79 - 97 fL   MCH 31.1 26.6 - 33.0 pg   MCHC 31.7 31.5 - 35.7 g/dL   RDW 86.7 88.3 - 84.5 %   Platelets 242 150 - 450 x10E3/uL   Neutrophils 72 Not Estab. %   Lymphs 17 Not Estab. %   Monocytes 8 Not Estab. %   Eos 3 Not Estab. %   Basos 0 Not Estab. %   Neutrophils Absolute 4.2 1.4 -  7.0 x10E3/uL   Lymphocytes Absolute 1.0 0.7 - 3.1 x10E3/uL   Monocytes Absolute 0.5 0.1 - 0.9 x10E3/uL   EOS (ABSOLUTE) 0.2 0.0 - 0.4 x10E3/uL   Basophils Absolute 0.0 0.0 - 0.2 x10E3/uL   Immature Granulocytes 0 Not Estab. %   Immature Grans (Abs) 0.0 0.0 - 0.1 x10E3/uL       Assessment & Plan Malignant neoplasm of lung, unspecified laterality, unspecified part of lung (HCC) Patient is seen by Oncology, who manage this condition.  He is well controlled with current therapy.   Will defer to them for further changes to plan of care.  Stage 3b chronic kidney disease (CKD) (HCC) Have referred pt to nephrology previously, will find another to refer to if his labs still consistent with CKD.  Hyperlipidemia, unspecified hyperlipidemia type Checking labs today.  Continue current therapy for lipid control. Will modify as needed based on labwork results.   -CMP w/eGFR -Lipid Panel  Prediabetes A1C Continues to be in prediabetic ranges.  Will reassess at follow up after next lab check.  Patient counseled on dietary choices and verbalized understanding.   -CBC w/Diff -CMP w/eGFR -Hemoglobin A1C  Essential hypertension Blood pressure well controlled with current medications.  Continue current therapy.  Will reassess at follow up.   - CBC w/Diff - CMP w/eGFR  Vitamin D  deficiency, unspecified B12 deficiency due to diet Other  fatigue Checking labs today.  Will continue supplements as needed.   - Vitamin D  - Vitamin B12 - TSH   Return in about 4 months (around 05/19/2024).   Total time spent: 20 minutes  Martin CHRISTELLA ARRANT, FNP  01/18/2024   This document may have been prepared by [facility] Voice Recognition software and as such may include unintentional dictation errors.

## 2024-02-10 ENCOUNTER — Other Ambulatory Visit: Payer: Self-pay | Admitting: Internal Medicine

## 2024-03-01 ENCOUNTER — Other Ambulatory Visit: Payer: Self-pay | Admitting: Family

## 2024-05-04 ENCOUNTER — Ambulatory Visit: Admitting: Family

## 2024-05-25 ENCOUNTER — Ambulatory Visit (INDEPENDENT_AMBULATORY_CARE_PROVIDER_SITE_OTHER): Admitting: Family

## 2024-05-25 ENCOUNTER — Encounter: Payer: Self-pay | Admitting: Family

## 2024-05-25 VITALS — BP 134/74 | HR 68 | Ht 71.0 in | Wt 176.4 lb

## 2024-05-25 DIAGNOSIS — E785 Hyperlipidemia, unspecified: Secondary | ICD-10-CM | POA: Diagnosis not present

## 2024-05-25 DIAGNOSIS — C3490 Malignant neoplasm of unspecified part of unspecified bronchus or lung: Secondary | ICD-10-CM

## 2024-05-25 DIAGNOSIS — N1832 Chronic kidney disease, stage 3b: Secondary | ICD-10-CM

## 2024-05-25 DIAGNOSIS — E1165 Type 2 diabetes mellitus with hyperglycemia: Secondary | ICD-10-CM

## 2024-05-25 DIAGNOSIS — J479 Bronchiectasis, uncomplicated: Secondary | ICD-10-CM

## 2024-05-25 DIAGNOSIS — R5383 Other fatigue: Secondary | ICD-10-CM

## 2024-05-25 DIAGNOSIS — J431 Panlobular emphysema: Secondary | ICD-10-CM

## 2024-05-25 DIAGNOSIS — E559 Vitamin D deficiency, unspecified: Secondary | ICD-10-CM

## 2024-05-25 DIAGNOSIS — I10 Essential (primary) hypertension: Secondary | ICD-10-CM | POA: Diagnosis not present

## 2024-05-25 DIAGNOSIS — E538 Deficiency of other specified B group vitamins: Secondary | ICD-10-CM

## 2024-05-25 DIAGNOSIS — R7303 Prediabetes: Secondary | ICD-10-CM

## 2024-05-25 NOTE — Progress Notes (Signed)
 Established Patient Office Visit  Subjective:  Patient ID: Martin Jennings, male    DOB: [birth year]  Age: 73 y.o. MRN: [id number]  Chief Complaint  Patient presents with   Follow-up    3 month follow up    Patient is here today for his 3 months follow up.  He has been feeling poorly since last appointment.   He does have additional concerns to discuss today.  He stopped smoking 5 days ago and he has been having daily cough that has been productive with thick sputum.  He was shown on CT scan previously to have bronchiectasis.   Labs are due today.  He needs refills.   I have reviewed his active problem list, medication list, allergies, health maintenance, notes from last encounter, lab results for his appointment today.      No other concerns at this time.   Past Medical History:  Diagnosis Date   Acute respiratory failure with hypoxia (HCC) 07/29/2022   AKI (acute kidney injury) 04/04/2020   Bladder cancer (HCC) 2012   CAP (community acquired pneumonia) 04/04/2020   COPD suggested by initial evaluation 10/11/2022   COVID-19 virus infection 04/03/2020   Goals of care, counseling/discussion 05/19/2020   Hypertension    Influenza A with pneumonia 07/29/2022   Leg pain 01/25/2017   Lung cancer (HCC)    Malignant neoplasm of bladder, unspecified (HCC) 12/17/2010   Peripheral vascular disease    Sepsis (HCC) 07/29/2022    Past Surgical History:  Procedure Laterality Date   BLADDER REMOVAL     LOWER EXTREMITY ANGIOGRAPHY Left 04/12/2017   Procedure: Lower Extremity Angiography;  Surgeon: Jama Cordella MATSU, MD;  Location: [facility];  Service: Cardiovascular;  Laterality: Left;   PORTA CATH INSERTION N/A 05/22/2020   Procedure: PORTA CATH INSERTION;  Surgeon: Marea Selinda RAMAN, MD;  Location: [facility];  Service: Cardiovascular;  Laterality: N/A;   PORTA CATH REMOVAL N/A 11/15/2022   Procedure: PORTA CATH REMOVAL;  Surgeon: Marea Selinda RAMAN, MD;  Location:  [facility];  Service: Cardiovascular;  Laterality: N/A;   uretostomy     VASCULAR SURGERY      Social History   Socioeconomic History   Marital status: Married    Spouse name: Not on file   Number of children: Not on file   Years of education: Not on file   Highest education level: Not on file  Occupational History   Not on file  Tobacco Use   Smoking status: Former    Current packs/day: 0.00    Average packs/day: 0.5 packs/day for 50.0 years (25.0 ttl pk-yrs)    Types: Cigarettes    Quit date: 08/2023    Years since quitting: 0.7   Smokeless tobacco: Never   Tobacco comments:    Per pt. He smokes 1 pack every 3 days  Vaping Use   Vaping status: Never Used  Substance and Sexual Activity   Alcohol use: No   Drug use: Not Currently    Types: Marijuana   Sexual activity: Not Currently  Other Topics Concern   Not on file  Social History Narrative   Not on file   Social Drivers of Health   Financial Resource Strain: Not on file  Food Insecurity: No Food Insecurity (07/30/2022)   Hunger Vital Sign    Worried About Running Out of Food in the Last Year: Never true    Ran Out of Food in the Last Year: Never true  Transportation Needs: No Transportation Needs (07/30/2022)   PRAPARE - Administrator, Civil Service (Medical): No    Lack of Transportation (Non-Medical): No  Physical Activity: Not on file  Stress: Not on file  Social Connections: Not on file  Intimate Partner Violence: Not At Risk (07/30/2022)   Humiliation, Afraid, Rape, and Kick questionnaire    Fear of Current or Ex-Partner: No    Emotionally Abused: No    Physically Abused: No    Sexually Abused: No    No family history on file.  No Known Allergies  Review of Systems  All other systems reviewed and are negative.      Objective:   BP 134/74   Pulse 68   Ht 5' 11 (1.803 m)   Wt 176 lb 6.4 oz (80 kg)   SpO2 91%   BMI 24.60 kg/m   Vitals:   05/25/24 1310  BP: 134/74   Pulse: 68  Height: 5' 11 (1.803 m)  Weight: 176 lb 6.4 oz (80 kg)  SpO2: 91%  BMI (Calculated): 24.61    Physical Exam Vitals and nursing note reviewed.  Constitutional:      Appearance: Normal appearance. He is normal weight.  Eyes:     Pupils: Pupils are equal, round, and reactive to light.  Cardiovascular:     Rate and Rhythm: Normal rate and regular rhythm.     Pulses: Normal pulses.     Heart sounds: Normal heart sounds.  Pulmonary:     Effort: Pulmonary effort is normal.     Breath sounds: Normal breath sounds.  Neurological:     Mental Status: He is alert.  Psychiatric:        Mood and Affect: Mood normal.        Behavior: Behavior normal.      No results found for any visits on 05/25/24.  No results found for this or any previous visit (from the past 2160 hours).     Assessment & Plan Stage 3b chronic kidney disease (CKD) (HCC) Setting patient up for referral to nephrology.  Will defer to them for further treatment changes.  Reassess at follow up.  Hyperlipidemia, unspecified hyperlipidemia type Checking labs today.  Continue current therapy for lipid control. Will modify as needed based on labwork results.   -CMP w/eGFR -Lipid Panel  Essential hypertension Blood pressure well controlled with current medications.  Continue current therapy.  Will reassess at follow up.   - CBC w/Diff - CMP w/eGFR  Vitamin D  deficiency, unspecified B12 deficiency due to diet Other fatigue Checking labs today.  Will continue supplements as needed.   - Vitamin D  - Vitamin B12 - TSH  Malignant neoplasm of lung, unspecified laterality, unspecified part of lung (HCC) Patient is seen by oncology, who manage this condition.  He is well controlled with current therapy.   Will defer to them for further changes to plan of care.  Bronchiectasis without complication (HCC) Panlobular emphysema (HCC) Ordering  HFCWO vest,  airway clearance therapy for  Bronchiectasis confirmed by CT. Patient has tried and failed PEP therapy which did not mobilize secretions.  Patient has had a DAILY productive cough for more than 6 months.    Type 2 diabetes mellitus with hyperglycemia, without long-term current use of insulin  (HCC) Checking labs today. Will call pt. With results  Continue current diabetes POC, as patient has been well controlled on current regimen.  Will adjust meds if needed based on labs.   -CBC w/Diff -  CMP w/eGFR -Hemoglobin A1C    Return in about 1 month (around 06/24/2024) for AWV.   Total time spent: 20 minutes  ALAN CHRISTELLA ARRANT, FNP  05/25/2024   This document may have been prepared by [facility] Voice Recognition software and as such may include unintentional dictation errors.

## 2024-05-28 ENCOUNTER — Other Ambulatory Visit: Payer: Self-pay

## 2024-05-28 MED ORDER — ALBUTEROL SULFATE (2.5 MG/3ML) 0.083% IN NEBU: 2.5000 mg | INHALATION_SOLUTION | RESPIRATORY_TRACT | 2 refills | Status: AC | PRN

## 2024-05-29 ENCOUNTER — Telehealth: Payer: Self-pay

## 2024-05-29 NOTE — Progress Notes (Signed)
   05/29/2024  Patient ID: Martin Jennings, male   DOB: [birth year], 73 y.o.   MRN: [id number]  Received notification from PCP that patient is having issues affording his inhaler Trelegy.  Contacted patient via telephone to see if he qualifies for PAP for alternative Breztri.  Patient should qualify, prepared paperwork. Patient will come by office to sign this week. Provider has completed her portion. Will submit to company once patient completes his.  Patient has been APPROVED to receive Breztri via [employer] through 07/25/25.  [name], PharmD Clinical Pharmacist [phone]

## 2024-05-30 ENCOUNTER — Encounter: Payer: Self-pay | Admitting: Family

## 2024-05-30 DIAGNOSIS — E1165 Type 2 diabetes mellitus with hyperglycemia: Secondary | ICD-10-CM | POA: Insufficient documentation

## 2024-05-30 NOTE — Assessment & Plan Note (Addendum)
 Setting patient up for referral to nephrology.  Will defer to them for further treatment changes.  Reassess at follow up.

## 2024-05-30 NOTE — Assessment & Plan Note (Signed)
 Checking labs today.  Continue current therapy for lipid control. Will modify as needed based on labwork results.   -CMP w/eGFR -Lipid Panel

## 2024-05-30 NOTE — Assessment & Plan Note (Signed)
 Patient is seen by oncology, who manage this condition.  He is well controlled with current therapy.   Will defer to them for further changes to plan of care.

## 2024-05-30 NOTE — Assessment & Plan Note (Signed)
 Checking labs today. Will call pt. With results  Continue current diabetes POC, as patient has been well controlled on current regimen.  Will adjust meds if needed based on labs.   -CBC w/Diff -CMP w/eGFR -Hemoglobin A1C

## 2024-05-30 NOTE — Assessment & Plan Note (Signed)
 Blood pressure well controlled with current medications.  Continue current therapy.  Will reassess at follow up.   - CBC w/Diff - CMP w/eGFR

## 2024-06-01 ENCOUNTER — Telehealth: Payer: Self-pay | Admitting: Family

## 2024-06-01 NOTE — Addendum Note (Signed)
 Addended by: LIONELL [name] DEL on: 06/01/2024 09:46 AM   Modules accepted: Orders

## 2024-06-01 NOTE — Telephone Encounter (Signed)
 Patient left VM to check status of the breathing equipment (nebulizer) that he is supposed to be getting.   The company should be reaching out to him soon and he should receive it no later than the first of next week.

## 2024-06-02 LAB — CMP14+EGFR
ALT: 18 IU/L (ref 0–44)
AST: 18 IU/L (ref 0–40)
Albumin: 3.7 g/dL — ABNORMAL LOW (ref 3.8–4.8)
Alkaline Phosphatase: 84 IU/L (ref 47–123)
BUN/Creatinine Ratio: 11 (ref 10–24)
BUN: 33 mg/dL — ABNORMAL HIGH (ref 8–27)
Bilirubin Total: 0.5 mg/dL (ref 0.0–1.2)
Calcium: 8.6 mg/dL (ref 8.6–10.2)
Chloride: 105 mmol/L (ref 96–106)
Creatinine, Ser: 2.92 mg/dL — ABNORMAL HIGH (ref 0.76–1.27)
Globulin, Total: 3.4 g/dL (ref 1.5–4.5)
Glucose: 90 mg/dL (ref 70–99)
Potassium: 4.2 mmol/L (ref 3.5–5.2)
Sodium: 140 mmol/L (ref 134–144)
Total Protein: 7.1 g/dL (ref 6.0–8.5)
eGFR: 22 mL/min/1.73 — ABNORMAL LOW (ref >59)

## 2024-06-02 LAB — CBC WITH DIFFERENTIAL/PLATELET
Basophils Absolute: 0 x10E3/uL (ref 0.0–0.2)
Basos: 0 %
EOS (ABSOLUTE): 0.1 x10E3/uL (ref 0.0–0.4)
Eos: 1 %
Hematocrit: 49.6 % (ref 37.5–51.0)
Hemoglobin: 15.9 g/dL (ref 13.0–17.7)
Immature Grans (Abs): 0 x10E3/uL (ref 0.0–0.1)
Immature Granulocytes: 0 %
Lymphocytes Absolute: 0.7 x10E3/uL (ref 0.7–3.1)
Lymphs: 13 %
MCH: 31.7 pg (ref 26.6–33.0)
MCHC: 32.1 g/dL (ref 31.5–35.7)
MCV: 99 fL — ABNORMAL HIGH (ref 79–97)
Monocytes Absolute: 0.4 x10E3/uL (ref 0.1–0.9)
Monocytes: 8 %
Neutrophils Absolute: 4.2 x10E3/uL (ref 1.4–7.0)
Neutrophils: 78 %
Platelets: 147 x10E3/uL — ABNORMAL LOW (ref 150–450)
RBC: 5.01 x10E6/uL (ref 4.14–5.80)
RDW: 12.6 % (ref 11.6–15.4)
WBC: 5.5 x10E3/uL (ref 3.4–10.8)

## 2024-06-02 LAB — LIPID PANEL
Chol/HDL Ratio: 2.4 ratio (ref 0.0–5.0)
Cholesterol, Total: 114 mg/dL (ref 100–199)
HDL: 48 mg/dL (ref >39)
LDL Chol Calc (NIH): 53 mg/dL (ref 0–99)
Triglycerides: 61 mg/dL (ref 0–149)
VLDL Cholesterol Cal: 13 mg/dL (ref 5–40)

## 2024-06-02 LAB — TSH: TSH: 1.62 u[IU]/mL (ref 0.450–4.500)

## 2024-06-02 LAB — HEMOGLOBIN A1C
Est. average glucose Bld gHb Est-mCnc: 134 mg/dL
Hgb A1c MFr Bld: 6.3 % — ABNORMAL HIGH (ref 4.8–5.6)

## 2024-06-02 LAB — VITAMIN D 25 HYDROXY (VIT D DEFICIENCY, FRACTURES)

## 2024-06-02 LAB — VITAMIN B12

## 2024-06-05 NOTE — Telephone Encounter (Signed)
Pt informed

## 2024-06-07 NOTE — Telephone Encounter (Signed)
 Patient left another VM stating he still hasn't received his medical equipment and would like an update on this.

## 2024-06-18 ENCOUNTER — Other Ambulatory Visit: Payer: Self-pay | Admitting: Family

## 2024-06-29 ENCOUNTER — Ambulatory Visit: Payer: Self-pay

## 2024-07-03 ENCOUNTER — Telehealth: Payer: Self-pay

## 2024-07-03 ENCOUNTER — Ambulatory Visit: Admitting: Family

## 2024-07-03 ENCOUNTER — Encounter: Payer: Self-pay | Admitting: Family

## 2024-07-03 VITALS — BP 116/68 | HR 72 | Ht 71.0 in | Wt 187.4 lb

## 2024-07-03 DIAGNOSIS — Z0001 Encounter for general adult medical examination with abnormal findings: Secondary | ICD-10-CM | POA: Diagnosis not present

## 2024-07-03 DIAGNOSIS — J849 Interstitial pulmonary disease, unspecified: Secondary | ICD-10-CM | POA: Diagnosis not present

## 2024-07-03 DIAGNOSIS — N1832 Chronic kidney disease, stage 3b: Secondary | ICD-10-CM

## 2024-07-03 DIAGNOSIS — E559 Vitamin D deficiency, unspecified: Secondary | ICD-10-CM

## 2024-07-03 DIAGNOSIS — Z936 Other artificial openings of urinary tract status: Secondary | ICD-10-CM

## 2024-07-03 DIAGNOSIS — R5383 Other fatigue: Secondary | ICD-10-CM

## 2024-07-03 DIAGNOSIS — E538 Deficiency of other specified B group vitamins: Secondary | ICD-10-CM | POA: Diagnosis not present

## 2024-07-03 DIAGNOSIS — Z0130 Encounter for examination of blood pressure without abnormal findings: Secondary | ICD-10-CM

## 2024-07-03 DIAGNOSIS — C3490 Malignant neoplasm of unspecified part of unspecified bronchus or lung: Secondary | ICD-10-CM | POA: Diagnosis not present

## 2024-07-03 DIAGNOSIS — E1165 Type 2 diabetes mellitus with hyperglycemia: Secondary | ICD-10-CM

## 2024-07-03 DIAGNOSIS — Z1211 Encounter for screening for malignant neoplasm of colon: Secondary | ICD-10-CM | POA: Diagnosis not present

## 2024-07-03 NOTE — Patient Instructions (Addendum)
 I have sent referral for your eye exam.  If they do not call you to schedule, call to set up   [facility] Address: [address] [place], [place] [address] Phone: [phone]   I have also sent referral for the kidney doctor, at the same office that Martin Jennings goes to.   [facility]  Address: [address] [place], [place] [address] Phone: [phone]    New Cologuard order has been sent, they will send the box to your home.   [name] has the paperwork for your Jardiance, and she will get you taken care of with this.

## 2024-07-03 NOTE — Progress Notes (Signed)
   07/03/2024  Patient ID: Martin Jennings, male   DOB: 08-21-1950, 73 y.o.   MRN: [id number]  PCP would like to start patient on Jardiance 10mg  for CKD/T2DM. Patient should qualify to get for free via [employer].  Submitted completed application for Jardiance 10mg  to [facility], pending decision.  Martin Jennings, PharmD Clinical Pharmacist [phone]

## 2024-07-04 LAB — IRON,TIBC AND FERRITIN PANEL
Ferritin: 92 ng/mL (ref 30–400)
Iron Saturation: 22 % (ref 15–55)
Iron: 61 ug/dL (ref 38–169)
Total Iron Binding Capacity: 277 ug/dL (ref 250–450)
UIBC: 216 ug/dL (ref 111–343)

## 2024-07-04 LAB — VITAMIN D 25 HYDROXY (VIT D DEFICIENCY, FRACTURES): Vit D, 25-Hydroxy: 23.6 ng/mL — ABNORMAL LOW (ref 30.0–100.0)

## 2024-07-04 LAB — VITAMIN B12: Vitamin B-12: 503 pg/mL (ref 232–1245)

## 2024-07-10 ENCOUNTER — Inpatient Hospital Stay: Admission: EM | Admit: 2024-07-10 | Discharge: 2024-07-22 | DRG: 189 | Disposition: A | Discharge status: Home Health

## 2024-07-10 ENCOUNTER — Emergency Department

## 2024-07-10 ENCOUNTER — Other Ambulatory Visit: Payer: Self-pay

## 2024-07-10 DIAGNOSIS — I5033 Acute on chronic diastolic (congestive) heart failure: Secondary | ICD-10-CM

## 2024-07-10 DIAGNOSIS — R042 Hemoptysis: Secondary | ICD-10-CM | POA: Diagnosis present

## 2024-07-10 DIAGNOSIS — Z1152 Encounter for screening for COVID-19: Secondary | ICD-10-CM

## 2024-07-10 DIAGNOSIS — E1122 Type 2 diabetes mellitus with diabetic chronic kidney disease: Secondary | ICD-10-CM

## 2024-07-10 DIAGNOSIS — E1151 Type 2 diabetes mellitus with diabetic peripheral angiopathy without gangrene: Secondary | ICD-10-CM | POA: Diagnosis present

## 2024-07-10 DIAGNOSIS — Z91148 Patient's other noncompliance with medication regimen for other reason: Secondary | ICD-10-CM

## 2024-07-10 DIAGNOSIS — I129 Hypertensive chronic kidney disease with stage 1 through stage 4 chronic kidney disease, or unspecified chronic kidney disease: Secondary | ICD-10-CM | POA: Diagnosis present

## 2024-07-10 DIAGNOSIS — J869 Pyothorax without fistula: Secondary | ICD-10-CM | POA: Diagnosis present

## 2024-07-10 DIAGNOSIS — J9621 Acute and chronic respiratory failure with hypoxia: Secondary | ICD-10-CM | POA: Diagnosis present

## 2024-07-10 DIAGNOSIS — Z794 Long term (current) use of insulin: Secondary | ICD-10-CM

## 2024-07-10 DIAGNOSIS — Z9981 Dependence on supplemental oxygen: Secondary | ICD-10-CM

## 2024-07-10 DIAGNOSIS — J9811 Atelectasis: Secondary | ICD-10-CM | POA: Diagnosis present

## 2024-07-10 DIAGNOSIS — J439 Emphysema, unspecified: Secondary | ICD-10-CM | POA: Diagnosis present

## 2024-07-10 DIAGNOSIS — Z79899 Other long term (current) drug therapy: Secondary | ICD-10-CM

## 2024-07-10 DIAGNOSIS — I2510 Atherosclerotic heart disease of native coronary artery without angina pectoris: Secondary | ICD-10-CM | POA: Diagnosis present

## 2024-07-10 DIAGNOSIS — J441 Chronic obstructive pulmonary disease with (acute) exacerbation: Secondary | ICD-10-CM | POA: Diagnosis present

## 2024-07-10 DIAGNOSIS — I10 Essential (primary) hypertension: Secondary | ICD-10-CM | POA: Diagnosis present

## 2024-07-10 DIAGNOSIS — Z7902 Long term (current) use of antithrombotics/antiplatelets: Secondary | ICD-10-CM | POA: Diagnosis not present

## 2024-07-10 DIAGNOSIS — E8729 Other acidosis: Secondary | ICD-10-CM | POA: Diagnosis present

## 2024-07-10 DIAGNOSIS — J918 Pleural effusion in other conditions classified elsewhere: Secondary | ICD-10-CM | POA: Diagnosis present

## 2024-07-10 DIAGNOSIS — Z8551 Personal history of malignant neoplasm of bladder: Secondary | ICD-10-CM

## 2024-07-10 DIAGNOSIS — N184 Chronic kidney disease, stage 4 (severe): Secondary | ICD-10-CM | POA: Diagnosis present

## 2024-07-10 DIAGNOSIS — J9622 Acute and chronic respiratory failure with hypercapnia: Secondary | ICD-10-CM | POA: Diagnosis present

## 2024-07-10 DIAGNOSIS — E785 Hyperlipidemia, unspecified: Secondary | ICD-10-CM | POA: Diagnosis present

## 2024-07-10 DIAGNOSIS — Z8616 Personal history of COVID-19: Secondary | ICD-10-CM

## 2024-07-10 DIAGNOSIS — I2489 Other forms of acute ischemic heart disease: Secondary | ICD-10-CM | POA: Diagnosis present

## 2024-07-10 DIAGNOSIS — J440 Chronic obstructive pulmonary disease with acute lower respiratory infection: Secondary | ICD-10-CM | POA: Diagnosis present

## 2024-07-10 DIAGNOSIS — E875 Hyperkalemia: Secondary | ICD-10-CM | POA: Diagnosis present

## 2024-07-10 DIAGNOSIS — C3431 Malignant neoplasm of lower lobe, right bronchus or lung: Secondary | ICD-10-CM | POA: Diagnosis present

## 2024-07-10 DIAGNOSIS — I700 Atherosclerosis of aorta: Secondary | ICD-10-CM | POA: Diagnosis present

## 2024-07-10 DIAGNOSIS — N179 Acute kidney failure, unspecified: Secondary | ICD-10-CM | POA: Diagnosis present

## 2024-07-10 DIAGNOSIS — J189 Pneumonia, unspecified organism: Secondary | ICD-10-CM | POA: Diagnosis present

## 2024-07-10 DIAGNOSIS — I5189 Other ill-defined heart diseases: Secondary | ICD-10-CM | POA: Diagnosis present

## 2024-07-10 DIAGNOSIS — F17210 Nicotine dependence, cigarettes, uncomplicated: Secondary | ICD-10-CM | POA: Diagnosis present

## 2024-07-10 DIAGNOSIS — Z923 Personal history of irradiation: Secondary | ICD-10-CM

## 2024-07-10 DIAGNOSIS — Z7984 Long term (current) use of oral hypoglycemic drugs: Secondary | ICD-10-CM

## 2024-07-10 DIAGNOSIS — J9602 Acute respiratory failure with hypercapnia: Secondary | ICD-10-CM | POA: Diagnosis present

## 2024-07-10 DIAGNOSIS — N183 Chronic kidney disease, stage 3 unspecified: Secondary | ICD-10-CM | POA: Diagnosis not present

## 2024-07-10 DIAGNOSIS — Z85118 Personal history of other malignant neoplasm of bronchus and lung: Secondary | ICD-10-CM

## 2024-07-10 DIAGNOSIS — N1832 Chronic kidney disease, stage 3b: Secondary | ICD-10-CM

## 2024-07-10 DIAGNOSIS — I5031 Acute diastolic (congestive) heart failure: Secondary | ICD-10-CM | POA: Diagnosis not present

## 2024-07-10 DIAGNOSIS — K5900 Constipation, unspecified: Secondary | ICD-10-CM | POA: Diagnosis present

## 2024-07-10 DIAGNOSIS — Z9889 Other specified postprocedural states: Secondary | ICD-10-CM

## 2024-07-10 DIAGNOSIS — M109 Gout, unspecified: Secondary | ICD-10-CM | POA: Diagnosis present

## 2024-07-10 DIAGNOSIS — Z9221 Personal history of antineoplastic chemotherapy: Secondary | ICD-10-CM

## 2024-07-10 LAB — PROTIME-INR
INR: 1.1 (ref 0.8–1.2)
Prothrombin Time: 14.9 s (ref 11.4–15.2)

## 2024-07-10 LAB — BLOOD GAS, VENOUS
Acid-base deficit: 1.4 mmol/L (ref 0.0–2.0)
Bicarbonate: 26.6 mmol/L (ref 20.0–28.0)
O2 Saturation: 53.5 %
Patient temperature: 37
pCO2, Ven: 58 mmHg (ref 44–60)
pH, Ven: 7.27 (ref 7.25–7.43)
pO2, Ven: 35 mmHg (ref 32–45)

## 2024-07-10 LAB — CBC WITH DIFFERENTIAL/PLATELET
Abs Immature Granulocytes: 0.02 K/uL (ref 0.00–0.07)
Basophils Absolute: 0 K/uL (ref 0.0–0.1)
Basophils Relative: 0 %
Eosinophils Absolute: 0.1 K/uL (ref 0.0–0.5)
Eosinophils Relative: 2 %
HCT: 57.4 % — ABNORMAL HIGH (ref 39.0–52.0)
Hemoglobin: 18.3 g/dL — ABNORMAL HIGH (ref 13.0–17.0)
Immature Granulocytes: 0 %
Lymphocytes Relative: 21 %
Lymphs Abs: 1 K/uL (ref 0.7–4.0)
MCH: 31.7 pg (ref 26.0–34.0)
MCHC: 31.9 g/dL (ref 30.0–36.0)
MCV: 99.3 fL (ref 80.0–100.0)
Monocytes Absolute: 0.2 K/uL (ref 0.1–1.0)
Monocytes Relative: 5 %
Neutro Abs: 3.3 K/uL (ref 1.7–7.7)
Neutrophils Relative %: 72 %
Platelets: 128 K/uL — ABNORMAL LOW (ref 150–400)
RBC: 5.78 MIL/uL (ref 4.22–5.81)
RDW: 13.4 % (ref 11.5–15.5)
WBC: 4.6 K/uL (ref 4.0–10.5)
nRBC: 0 % (ref 0.0–0.2)

## 2024-07-10 LAB — COMPREHENSIVE METABOLIC PANEL WITH GFR
ALT: 11 U/L (ref 0–44)
AST: 13 U/L — ABNORMAL LOW (ref 15–41)
Albumin: 3.5 g/dL (ref 3.5–5.0)
Alkaline Phosphatase: 87 U/L (ref 38–126)
Anion gap: 12 (ref 5–15)
BUN: 33 mg/dL — ABNORMAL HIGH (ref 8–23)
CO2: 22 mmol/L (ref 22–32)
Calcium: 8.7 mg/dL — ABNORMAL LOW (ref 8.9–10.3)
Chloride: 105 mmol/L (ref 98–111)
Creatinine, Ser: 2.76 mg/dL — ABNORMAL HIGH (ref 0.61–1.24)
GFR, Estimated: 24 mL/min — ABNORMAL LOW (ref >60)
Glucose, Bld: 109 mg/dL — ABNORMAL HIGH (ref 70–99)
Potassium: 5.1 mmol/L (ref 3.5–5.1)
Sodium: 138 mmol/L (ref 135–145)
Total Bilirubin: 0.7 mg/dL (ref 0.0–1.2)
Total Protein: 7.2 g/dL (ref 6.5–8.1)

## 2024-07-10 LAB — RESP PANEL BY RT-PCR (RSV, FLU A&B, COVID)  RVPGX2
Influenza A by PCR: NEGATIVE
Influenza B by PCR: NEGATIVE
Resp Syncytial Virus by PCR: NEGATIVE
SARS Coronavirus 2 by RT PCR: NEGATIVE

## 2024-07-10 LAB — LACTIC ACID, PLASMA: Lactic Acid, Venous: 1.6 mmol/L (ref 0.5–1.9)

## 2024-07-10 LAB — PRO BRAIN NATRIURETIC PEPTIDE: Pro Brain Natriuretic Peptide: 6000 pg/mL — ABNORMAL HIGH (ref <300.0)

## 2024-07-10 LAB — CBG MONITORING, ED: Glucose-Capillary: 158 mg/dL — ABNORMAL HIGH (ref 70–99)

## 2024-07-10 LAB — TROPONIN T, HIGH SENSITIVITY: Troponin T High Sensitivity: 37 ng/L — ABNORMAL HIGH (ref 0–19)

## 2024-07-10 MED ORDER — IPRATROPIUM-ALBUTEROL 0.5-2.5 (3) MG/3ML IN SOLN: 3.0000 mL | RESPIRATORY_TRACT | Status: DC | PRN

## 2024-07-10 MED ORDER — INSULIN ASPART 100 UNIT/ML IJ SOLN
0.0000 [IU] | Freq: Three times a day (TID) | INTRAMUSCULAR | Status: DC
  Administered 2024-07-11 – 2024-07-12 (×5): 4 [IU] via SUBCUTANEOUS
  Administered 2024-07-12: 13:00:00 3 [IU] via SUBCUTANEOUS
  Administered 2024-07-13: 4 [IU] via SUBCUTANEOUS
  Administered 2024-07-13 (×2): 3 [IU] via SUBCUTANEOUS
  Administered 2024-07-14 – 2024-07-15 (×2): 4 [IU] via SUBCUTANEOUS
  Administered 2024-07-15: 7 [IU] via SUBCUTANEOUS
  Administered 2024-07-15: 3 [IU] via SUBCUTANEOUS
  Administered 2024-07-16 – 2024-07-17 (×2): 4 [IU] via SUBCUTANEOUS
  Administered 2024-07-17 – 2024-07-18 (×2): 3 [IU] via SUBCUTANEOUS
  Administered 2024-07-18: 4 [IU] via SUBCUTANEOUS
  Administered 2024-07-18: 3 [IU] via SUBCUTANEOUS
  Administered 2024-07-19: 4 [IU] via SUBCUTANEOUS
  Administered 2024-07-20: 3 [IU] via SUBCUTANEOUS
  Administered 2024-07-21: 7 [IU] via SUBCUTANEOUS
  Administered 2024-07-21 – 2024-07-22 (×3): 4 [IU] via SUBCUTANEOUS
  Filled 2024-07-10: qty 4
  Filled 2024-07-10 (×2): qty 3
  Filled 2024-07-10 (×2): qty 4
  Filled 2024-07-10: qty 3
  Filled 2024-07-10 (×4): qty 4
  Filled 2024-07-10: qty 6
  Filled 2024-07-10: qty 4
  Filled 2024-07-10: qty 3
  Filled 2024-07-10 (×3): qty 4
  Filled 2024-07-10: qty 7
  Filled 2024-07-10: qty 4
  Filled 2024-07-10 (×2): qty 3
  Filled 2024-07-10: qty 4
  Filled 2024-07-10: qty 3
  Filled 2024-07-10 (×2): qty 4

## 2024-07-10 MED ORDER — IPRATROPIUM-ALBUTEROL 0.5-2.5 (3) MG/3ML IN SOLN
3.0000 mL | Freq: Once | RESPIRATORY_TRACT | Status: AC
  Administered 2024-07-10: 16:00:00 3 mL via RESPIRATORY_TRACT
  Filled 2024-07-10: qty 3

## 2024-07-10 MED ORDER — ENOXAPARIN SODIUM 30 MG/0.3ML IJ SOSY
30.0000 mg | PREFILLED_SYRINGE | INTRAMUSCULAR | Status: DC
  Administered 2024-07-10 – 2024-07-21 (×12): 30 mg via SUBCUTANEOUS
  Filled 2024-07-10 (×13): qty 0.3

## 2024-07-10 MED ORDER — CLOPIDOGREL BISULFATE 75 MG PO TABS
75.0000 mg | ORAL_TABLET | Freq: Every day | ORAL | Status: DC
  Administered 2024-07-10 – 2024-07-22 (×13): 75 mg via ORAL
  Filled 2024-07-10 (×13): qty 1

## 2024-07-10 MED ORDER — ONDANSETRON HCL 4 MG/2ML IJ SOLN: 4.0000 mg | Freq: Four times a day (QID) | INTRAMUSCULAR | Status: DC | PRN

## 2024-07-10 MED ORDER — METHYLPREDNISOLONE SODIUM SUCC 40 MG IJ SOLR
40.0000 mg | Freq: Two times a day (BID) | INTRAMUSCULAR | Status: AC
  Administered 2024-07-11 (×2): 40 mg via INTRAVENOUS
  Filled 2024-07-10 (×2): qty 1

## 2024-07-10 MED ORDER — ACETAMINOPHEN 325 MG PO TABS: 650.0000 mg | ORAL_TABLET | Freq: Four times a day (QID) | ORAL | Status: DC | PRN

## 2024-07-10 MED ORDER — EMPAGLIFLOZIN 10 MG PO TABS
10.0000 mg | ORAL_TABLET | Freq: Every day | ORAL | Status: DC
  Administered 2024-07-11 – 2024-07-22 (×12): 10 mg via ORAL
  Filled 2024-07-10 (×12): qty 1

## 2024-07-10 MED ORDER — INSULIN ASPART 100 UNIT/ML IJ SOLN: 0.0000 [IU] | Freq: Every day | INTRAMUSCULAR | Status: DC

## 2024-07-10 MED ORDER — SODIUM CHLORIDE 0.9 % IV SOLN
1.0000 g | INTRAVENOUS | Status: DC
  Administered 2024-07-10 – 2024-07-13 (×4): 1 g via INTRAVENOUS
  Filled 2024-07-10 (×5): qty 10

## 2024-07-10 MED ORDER — LORATADINE 10 MG PO TABS
10.0000 mg | ORAL_TABLET | Freq: Every day | ORAL | Status: DC
  Administered 2024-07-11 – 2024-07-22 (×12): 10 mg via ORAL
  Filled 2024-07-10 (×12): qty 1

## 2024-07-10 MED ORDER — AMLODIPINE BESYLATE 10 MG PO TABS
10.0000 mg | ORAL_TABLET | Freq: Every day | ORAL | Status: DC
  Administered 2024-07-10 – 2024-07-22 (×13): 10 mg via ORAL
  Filled 2024-07-10 (×8): qty 1
  Filled 2024-07-10: qty 2
  Filled 2024-07-10 (×4): qty 1

## 2024-07-10 MED ORDER — FUROSEMIDE 10 MG/ML IJ SOLN
40.0000 mg | Freq: Two times a day (BID) | INTRAMUSCULAR | Status: DC
  Administered 2024-07-10 – 2024-07-11 (×2): 40 mg via INTRAVENOUS
  Filled 2024-07-10: qty 4

## 2024-07-10 MED ORDER — GUAIFENESIN ER 600 MG PO TB12
600.0000 mg | ORAL_TABLET | Freq: Two times a day (BID) | ORAL | Status: DC
  Administered 2024-07-10 – 2024-07-22 (×24): 600 mg via ORAL
  Filled 2024-07-10 (×24): qty 1

## 2024-07-10 MED ORDER — IPRATROPIUM-ALBUTEROL 0.5-2.5 (3) MG/3ML IN SOLN
3.0000 mL | Freq: Once | RESPIRATORY_TRACT | Status: AC
  Administered 2024-07-10: 18:00:00 3 mL via RESPIRATORY_TRACT
  Filled 2024-07-10: qty 3

## 2024-07-10 MED ORDER — PREDNISONE 20 MG PO TABS
40.0000 mg | ORAL_TABLET | Freq: Every day | ORAL | Status: DC
  Administered 2024-07-12 – 2024-07-13 (×2): 40 mg via ORAL
  Filled 2024-07-10 (×2): qty 2

## 2024-07-10 MED ORDER — TRAZODONE HCL 50 MG PO TABS
25.0000 mg | ORAL_TABLET | Freq: Every evening | ORAL | Status: DC | PRN
  Administered 2024-07-10 – 2024-07-21 (×5): 25 mg via ORAL
  Filled 2024-07-10 (×5): qty 1

## 2024-07-10 MED ORDER — ONDANSETRON HCL 4 MG PO TABS: 4.0000 mg | ORAL_TABLET | Freq: Four times a day (QID) | ORAL | Status: DC | PRN

## 2024-07-10 MED ORDER — SODIUM CHLORIDE 0.9 % IV SOLN: INTRAVENOUS | Status: DC

## 2024-07-10 MED ORDER — ALLOPURINOL 100 MG PO TABS
100.0000 mg | ORAL_TABLET | Freq: Two times a day (BID) | ORAL | Status: DC
  Administered 2024-07-10 – 2024-07-22 (×24): 100 mg via ORAL
  Filled 2024-07-10 (×25): qty 1

## 2024-07-10 MED ORDER — HYDROCOD POLI-CHLORPHE POLI ER 10-8 MG/5ML PO SUER
5.0000 mL | Freq: Two times a day (BID) | ORAL | Status: DC | PRN
  Administered 2024-07-10 – 2024-07-13 (×5): 5 mL via ORAL
  Filled 2024-07-10 (×5): qty 5

## 2024-07-10 MED ORDER — IPRATROPIUM-ALBUTEROL 0.5-2.5 (3) MG/3ML IN SOLN
3.0000 mL | Freq: Four times a day (QID) | RESPIRATORY_TRACT | Status: DC
  Administered 2024-07-11: 07:00:00 3 mL via RESPIRATORY_TRACT
  Filled 2024-07-10 (×2): qty 3

## 2024-07-10 MED ORDER — MAGNESIUM HYDROXIDE 400 MG/5ML PO SUSP
30.0000 mL | Freq: Every day | ORAL | Status: DC | PRN
  Administered 2024-07-16: 30 mL via ORAL
  Filled 2024-07-10: qty 30

## 2024-07-10 MED ORDER — METHYLPREDNISOLONE SODIUM SUCC 125 MG IJ SOLR
125.0000 mg | Freq: Once | INTRAMUSCULAR | Status: AC
  Administered 2024-07-10: 17:00:00 125 mg via INTRAVENOUS
  Filled 2024-07-10: qty 2

## 2024-07-10 MED ORDER — ATORVASTATIN CALCIUM 20 MG PO TABS
20.0000 mg | ORAL_TABLET | Freq: Every evening | ORAL | Status: DC
  Administered 2024-07-10 – 2024-07-21 (×12): 20 mg via ORAL
  Filled 2024-07-10 (×12): qty 1

## 2024-07-10 MED ORDER — ACETAMINOPHEN 650 MG RE SUPP: 650.0000 mg | Freq: Four times a day (QID) | RECTAL | Status: DC | PRN

## 2024-07-10 NOTE — Assessment & Plan Note (Addendum)
-   The patient will be placed on supplemental coverage with NovoLog  with resistant protocol given current steroid therapy. - Will continue Jardiance .

## 2024-07-10 NOTE — ED Triage Notes (Signed)
 Pt came in via EMS from home due to SOB for the past week and productive cough at home. Pt has history of COPD. Pt on 1L of oxygen  at home. Pt is noncompliant with medication and oxygen . No pain reported at this time. Pt A&Ox4.

## 2024-07-10 NOTE — Assessment & Plan Note (Addendum)
-   This is clearly secondary to #1 and #2. - O2 protocol will be followed as mentioned above. - Management otherwise as above.

## 2024-07-10 NOTE — H&P (Signed)
 [place]   PATIENT NAME: Martin Jennings    MR#:  [id number]  DATE OF BIRTH:  Jul 11, 1951  DATE OF ADMISSION:  07/10/2024  PRIMARY CARE PHYSICIAN: Orlean Alan HERO, FNP   Patient is coming from: Home  REQUESTING/REFERRING PHYSICIAN: Clarine Sharper, MD  CHIEF COMPLAINT:   Chief Complaint  Patient presents with   Shortness of Breath    HISTORY OF PRESENT ILLNESS:  Martin Jennings is a 73 y.o. male with medical history significant for COPD, type 2 diabetes mellitus, PVD, IBD and bronchiectasis, as well as lung cancer, essential hypertension, dyslipidemia, and gout, who presented to the emergency room with acute onset of worsening dyspnea over the last week with associated cough with excessive clear mucus production.  He has been having lightheadedness when attempting to walk to the bathroom.  He admitted to chills without reported fever.  The patient admitted to orthopnea and paroxysmal nocturnal dyspnea as well as dyspnea on exertion with lower extremity edema.  No fever or chills.  No nausea or vomiting or abdominal pain.  No dysuria, oliguria or hematuria or flank pain.  He denied any recent sick contacts.  He is at 1 L of O2 by nasal cannula at baseline.  Off of oxygen  he was satting in the 6s with EMS required 4 L of O2 by nasal cannula to bring his sats to the low 90s upon arrival to the ED.  No chest pain or palpitations.   ED Course: When he came to the ED, BP was 145/82 and pulse oximetry was 90% on 4 L of O2 by nasal cannula with otherwise normal vital signs.  Labs revealed VBG with pH 7.27 and HCO3 of 26.6 and CMP was remarkable for glucose 109 BUN 33 creatinine 2.76 calcium  8.7 and proBNP was 6000 with high-sensitivity troponin T of 37.  CBC showed hemoconcentration.  Respiratory panel came back negative.  Blood cultures were drawn. EKG as reviewed by me : EKG showed sinus rhythm at rate of 78 with borderline prolonged PR interval and probable left atrial enlargement with T  wave inversion anterolaterally and inferiorly with prolonged QT interval with QTc 519 MS. Imaging: Portable chest x-ray showed stable right upper lobe opacity most consistent with scarring and probable small right pleural effusion with bibasal reticular densities that are noted concerning for edema or scarring.  The patient was given 125 mg of IV Solu-Medrol  and 2 DuoNebs.  He will be admitted to a telemetry bed for further evaluation and management. PAST MEDICAL HISTORY:   Past Medical History:  Diagnosis Date   Acute respiratory failure with hypoxia (HCC) 07/29/2022   AKI (acute kidney injury) 04/04/2020   Bladder cancer (HCC) 2012   CAP (community acquired pneumonia) 04/04/2020   COPD suggested by initial evaluation 10/11/2022   COVID-19 virus infection 04/03/2020   Goals of care, counseling/discussion 05/19/2020   Hypertension    Influenza A with pneumonia 07/29/2022   Leg pain 01/25/2017   Lung cancer (HCC)    Malignant neoplasm of bladder, unspecified (HCC) 12/17/2010   Peripheral vascular disease    Sepsis (HCC) 07/29/2022  -Type 2 diabetes mellitus -Gout PAST SURGICAL HISTORY:   Past Surgical History:  Procedure Laterality Date   BLADDER REMOVAL     LOWER EXTREMITY ANGIOGRAPHY Left 04/12/2017   Procedure: Lower Extremity Angiography;  Surgeon: Jama Cordella MATSU, MD;  Location: [facility];  Service: Cardiovascular;  Laterality: Left;   PORTA CATH INSERTION N/A 05/22/2020   Procedure:  PORTA CATH INSERTION;  Surgeon: Marea Selinda RAMAN, MD;  Location: [facility];  Service: Cardiovascular;  Laterality: N/A;   PORTA CATH REMOVAL N/A 11/15/2022   Procedure: PORTA CATH REMOVAL;  Surgeon: Marea Selinda RAMAN, MD;  Location: [facility];  Service: Cardiovascular;  Laterality: N/A;   uretostomy     VASCULAR SURGERY      SOCIAL HISTORY:   Social History   Tobacco Use   Smoking status: Former    Current packs/day: 0.00    Average packs/day: 0.5 packs/day for  50.0 years (25.0 ttl pk-yrs)    Types: Cigarettes    Quit date: 08/2023    Years since quitting: 0.8   Smokeless tobacco: Never   Tobacco comments:    Per pt. He smokes 1 pack every 3 days  Substance Use Topics   Alcohol use: No    FAMILY HISTORY:  History reviewed. No pertinent family history.  DRUG ALLERGIES:  Allergies[1]  REVIEW OF SYSTEMS:   ROS As per history of present illness. All pertinent systems were reviewed above. Constitutional, HEENT, cardiovascular, respiratory, GI, GU, musculoskeletal, neuro, psychiatric, endocrine, integumentary and hematologic systems were reviewed and are otherwise negative/unremarkable except for positive findings mentioned above in the HPI.   MEDICATIONS AT HOME:   Prior to Admission medications  Medication Sig Start Date End Date Taking? Authorizing Provider  albuterol  (PROVENTIL ) (2.5 MG/3ML) 0.083% nebulizer solution Take 3 mLs (2.5 mg total) by nebulization every 4 (four) hours as needed for wheezing or shortness of breath. 05/28/24 05/28/25 Yes Orlean Alan HERO, FNP  albuterol  (VENTOLIN  HFA) 108 [phone] Base) MCG/ACT inhaler Inhale 2 puffs into the lungs every 6 (six) hours as needed for wheezing or shortness of breath. 01/18/24  Yes Orlean Alan HERO, FNP  allopurinol  (ZYLOPRIM ) 100 MG tablet TAKE 1 TABLET BY MOUTH TWICE  DAILY 11/04/23  Yes Fernand Fredy RAMAN, MD  amLODipine  (NORVASC ) 10 MG tablet TAKE 1 TABLET BY MOUTH DAILY 06/19/24  Yes Orlean Alan HERO, FNP  atorvastatin  (LIPITOR) 20 MG tablet TAKE 1 TABLET BY MOUTH IN THE  EVENING 02/13/24  Yes Fernand Fredy RAMAN, MD  budesonide-glycopyrrolate-formoterol (BREZTRI AEROSPHERE) 160-9-4.8 MCG/ACT AERO inhaler Inhale 2 puffs into the lungs 2 (two) times daily. THROUGH [employer]Me PAP   Yes [provider]  clopidogrel  (PLAVIX ) 75 MG tablet TAKE 1 TABLET BY MOUTH DAILY 11/08/23  Yes Orlean Alan HERO, FNP  empagliflozin  (JARDIANCE ) 10 MG TABS tablet Take 10 mg by mouth daily.   Yes [provider]  fexofenadine (ALLEGRA) 180 MG tablet Take 180 mg by mouth daily as needed. 11/25/21  Yes [provider]  prochlorperazine  (COMPAZINE ) 10 MG tablet Take 1 tablet (10 mg total) by mouth every 6 (six) hours as needed (Nausea or vomiting). Patient not taking: Reported on 08/02/2023 05/21/20 07/27/20  Melanee Annah BROCKS, MD      VITAL SIGNS:  Blood pressure (!) 140/70, pulse 75, temperature 97.7 F (36.5 C), temperature source Oral, resp. rate (!) 23, height 5' 11 (1.803 m), weight 82.9 kg, SpO2 93%.  PHYSICAL EXAMINATION:  Physical Exam  GENERAL:  73 y.o.-year-old Caucasian male patient lying in the bed with mild respiratory distress with conversational dyspnea. EYES: Pupils equal, round, reactive to light and accommodation. No scleral icterus. Extraocular muscles intact.  HEENT: Head atraumatic, normocephalic. Oropharynx and nasopharynx clear.  NECK:  Supple, no jugular venous distention. No thyroid  enlargement, no tenderness.  LUNGS: Diffuse expiratory wheezes with tight expiratory airflow and harsh vesicular breathing with  diminished bibasilar breath sounds and bibasilar rales. No use of accessory muscles of respiration.  CARDIOVASCULAR: Regular rate and rhythm, S1, S2 normal. No murmurs, rubs, or gallops.  ABDOMEN: Soft, nondistended, nontender. Bowel sounds present. No organomegaly or mass.  EXTREMITIES: 2+ bilateral lower extremity pitting edema with no cyanosis, or clubbing.  NEUROLOGIC: Cranial nerves II through XII are intact. Muscle strength 5/5 in all extremities. Sensation intact. Gait not checked.  PSYCHIATRIC: The patient is alert and oriented x 3.  Normal affect and good eye contact. SKIN: No obvious rash, lesion, or ulcer.   LABORATORY PANEL:   CBC Recent Labs  Lab 07/10/24 1633  WBC 4.6  HGB 18.3*  HCT 57.4*  PLT 128*   ------------------------------------------------------------------------------------------------------------------  Chemistries   Recent Labs  Lab 07/10/24 1918  NA 138  K 5.1  CL 105  CO2 22  GLUCOSE 109*  BUN 33*  CREATININE 2.76*  CALCIUM  8.7*  AST 13*  ALT 11  ALKPHOS 87  BILITOT 0.7   ------------------------------------------------------------------------------------------------------------------  Cardiac Enzymes No results for input(s): TROPONINI in the last 168 hours. ------------------------------------------------------------------------------------------------------------------  RADIOLOGY:  DG Chest Portable 1 View Result Date: 07/10/2024 CLINICAL DATA:  Hypoxia, cough EXAM: PORTABLE CHEST 1 VIEW COMPARISON:  August 10, 2022.  September 10, 2022. FINDINGS: Stable cardiomediastinal silhouette stable right upper lobe opacity most consistent with fibrosis probable small right pleural effusion is noted. Bibasilar reticular densities are noted concerning for edema or scarring. IMPRESSION: Stable right upper lobe opacity most consistent with scarring. Probable small right pleural effusion is noted. Bibasilar reticular densities are noted concerning for edema or scarring. Electronically Signed   By: Lynwood Landy Raddle M.D.   On: 07/10/2024 16:46      IMPRESSION AND PLAN:  Assessment and Plan: * COPD exacerbation (HCC) - The patient will be admitted to a medically monitored bed. - We will place the patient IV steroid therapy with IV Solu-Medrol  as well as nebulized bronchodilator therapy with duonebs q.i.d. and q.4 hours p.r.n.SABRA - Mucolytic therapy will be provided with Mucinex  and antibiotic therapy with IV Rocephin . - O2 protocol will be followed. -Will hold off long-acting beta agonist.   Acute on chronic diastolic CHF (congestive heart failure) (HCC) - We will continue diuresis with IV Lasix . - Will continue Jardiance . - We Will follow serial troponins. - We will follow I's and O's and daily weights.   Acute on chronic respiratory failure with hypoxia (HCC) - This is clearly secondary to  #1 and #2. - O2 protocol will be followed as mentioned above. - Management otherwise as above.  Type 2 diabetes mellitus with chronic kidney disease, without long-term current use of insulin  (HCC) - The patient will be placed on supplemental coverage with NovoLog  with resistant protocol given current steroid therapy. - Will continue Jardiance .  Essential hypertension - Will continue antihypertensive therapy.  Dyslipidemia - Will continue statin therapy.  Gout - Will continue allopurinol .   DVT prophylaxis: Lovenox .  Advanced Care Planning:  Code Status: full code.  Family Communication:  The plan of care was discussed in details with the patient (and family). I answered all questions. The patient agreed to proceed with the above mentioned plan. Further management will depend upon hospital course. Disposition Plan: Back to previous home environment Consults called: none.  All the records are reviewed and case discussed with ED provider.  Status is: Inpatient    At the time of the admission, it appears that the appropriate admission status for this patient is inpatient.  This is judged to be reasonable and necessary in order to provide the required intensity of service to ensure the patient's safety given the presenting symptoms, physical exam findings and initial radiographic and laboratory data in the context of comorbid conditions.  The patient requires inpatient status due to high intensity of service, high risk of further deterioration and high frequency of surveillance required.  I certify that at the time of admission, it is my clinical judgment that the patient will require inpatient hospital care extending more than 2 midnights.                            Dispo: The patient is from: Home              Anticipated d/c is to: Home              Patient currently is not medically stable to d/c.              Difficult to place patient: No  Madison DELENA Peaches M.D on 07/10/2024 at  10:23 PM  [facility]   From 7 PM-7 AM, contact night-coverage [email or web address]  CC: Primary care physician; Orlean Alan HERO, FNP     [1] No Known Allergies

## 2024-07-10 NOTE — ED Notes (Signed)
 Pt's wife, Valarie, given update via phone per request. Informed her pt has a room, is waiting to go upstairs.

## 2024-07-10 NOTE — ED Notes (Signed)
 Pt O2 saturation dropped to 82% while on 1.5L via [place] which is his baseline O2.

## 2024-07-10 NOTE — Assessment & Plan Note (Addendum)
-   We will continue diuresis with IV Lasix . - Will continue Jardiance . - We Will follow serial troponins. - We will follow I's and O's and daily weights.

## 2024-07-10 NOTE — ED Provider Notes (Signed)
 [facility] Provider Note    Event Date/Time   First MD Initiated Contact with Patient 07/10/24 1604     (approximate)   History   Shortness of Breath  Pt came in via EMS from home due to SOB for the past week and productive cough at home. Pt has history of COPD. Pt on 1L of oxygen  at home. Pt is noncompliant with medication and oxygen . No pain reported at this time. Pt A&Ox4.   HPI Martin Jennings is a 73 y.o. male PMH hypertension, hyperlipidemia, CKD, T2DM, interstitial pulmonary disease, bronchiectasis/emphysema, lung cancer presents for evaluation of shortness of breath - Patient told his wife to call an ambulance because he was feeling very short of breath and lightheaded when attempting to walk to the bathroom.  Tells me he has been feeling short of breath with increased cough and chills over the past week. -No abdominal pain, no urinary symptoms -No known sick contacts -Reportedly is on 1 L nasal cannula at baseline, off oxygen , satting in the 70s on EMS, he is requiring about 4 L nasal cannula to sat in the low 90s here in the emergency department     Physical Exam   Triage Vital Signs: BP (!) 140/70   Pulse 75   Temp 97.7 F (36.5 C) (Oral)   Resp (!) 23   Ht 5' 11 (1.803 m)   Wt 82.9 kg   SpO2 93%   BMI 25.49 kg/m     Most recent vital signs: Vitals:   07/10/24 2130 07/10/24 2143  BP: (!) 140/70 (!) 140/70  Pulse: 75   Resp: (!) 23   Temp:    SpO2: 93%      General: Awake, no distress.  CV:  Good peripheral perfusion. RRR, RP 2+.  Mild pitting edema noted to bilateral lower extremities. Resp:  Tachypneic, 5 word sentences mildly diminished airflow throughout, no obvious wheezing appreciated, coarse breath sounds right lower lung field Abd:  No distention. Nontender to deep palpation throughout.  Colostomy in place.    ED Results / Procedures / Treatments   Labs (all labs ordered are listed, but only abnormal results are  displayed) Labs Reviewed  CBC WITH DIFFERENTIAL/PLATELET - Abnormal; Notable for the following components:      Result Value   Hemoglobin 18.3 (*)    HCT 57.4 (*)    Platelets 128 (*)    All other components within normal limits  COMPREHENSIVE METABOLIC PANEL WITH GFR - Abnormal; Notable for the following components:   Glucose, Bld 109 (*)    BUN 33 (*)    Creatinine, Ser 2.76 (*)    Calcium  8.7 (*)    AST 13 (*)    GFR, Estimated 24 (*)    All other components within normal limits  PRO BRAIN NATRIURETIC PEPTIDE - Abnormal; Notable for the following components:   Pro Brain Natriuretic Peptide 6,000.0 (*)    All other components within normal limits  CBG MONITORING, ED - Abnormal; Notable for the following components:   Glucose-Capillary 158 (*)    All other components within normal limits  TROPONIN T, HIGH SENSITIVITY - Abnormal; Notable for the following components:   Troponin T High Sensitivity 37 (*)    All other components within normal limits  RESP PANEL BY RT-PCR (RSV, FLU A&B, COVID)  RVPGX2  CULTURE, BLOOD (ROUTINE X 2)  CULTURE, BLOOD (ROUTINE X 2)  LACTIC ACID, PLASMA  PROTIME-INR  BLOOD GAS, VENOUS  LACTIC  ACID, PLASMA  BASIC METABOLIC PANEL WITH GFR  CBC  TROPONIN T, HIGH SENSITIVITY  TROPONIN T, HIGH SENSITIVITY     EKG  Ecg = sinus rhythm, rate 78, no gross ST elevation or depression, some nonspecific biphasic T waves throughout, normal axis, QTc somewhat prolonged at 519.   RADIOLOGY Radiology interpreted by myself radiology report reviewed.  Small right pleural effusion versus scarring, no clear focal consolidation per radiology.    PROCEDURES:  Critical Care performed: Yes, see critical care procedure note(s)  .Critical Care  Performed by: Clarine Ozell LABOR, MD Authorized by: Clarine Ozell LABOR, MD   Critical care provider statement:    Critical care time (minutes):  30   Critical care time was exclusive of:  Separately billable procedures and  treating other patients   Critical care was necessary to treat or prevent imminent or life-threatening deterioration of the following conditions:  Respiratory failure   Critical care was time spent personally by me on the following activities:  Development of treatment plan with patient or surrogate, discussions with consultants, evaluation of patient's response to treatment, examination of patient, ordering and review of laboratory studies, ordering and review of radiographic studies, ordering and performing treatments and interventions, pulse oximetry, re-evaluation of patient's condition and review of old charts   I assumed direction of critical care for this patient from another provider in my specialty: no      MEDICATIONS ORDERED IN ED: Medications  allopurinol  (ZYLOPRIM ) tablet 100 mg (has no administration in time range)  amLODipine  (NORVASC ) tablet 10 mg (10 mg Oral Given 07/10/24 2143)  atorvastatin  (LIPITOR) tablet 20 mg (20 mg Oral Given 07/10/24 2143)  empagliflozin  (JARDIANCE ) tablet 10 mg (has no administration in time range)  clopidogrel  (PLAVIX ) tablet 75 mg (75 mg Oral Given 07/10/24 2143)  loratadine  (CLARITIN ) tablet 10 mg (has no administration in time range)  cefTRIAXone  (ROCEPHIN ) 1 g in sodium chloride  0.9 % 100 mL IVPB (0 g Intravenous Stopped 07/10/24 2240)  methylPREDNISolone  sodium succinate (SOLU-MEDROL ) 40 mg/mL injection 40 mg (has no administration in time range)    Followed by  predniSONE  (DELTASONE ) tablet 40 mg (has no administration in time range)  enoxaparin  (LOVENOX ) injection 30 mg (has no administration in time range)  acetaminophen  (TYLENOL ) tablet 650 mg (has no administration in time range)    Or  acetaminophen  (TYLENOL ) suppository 650 mg (has no administration in time range)  traZODone  (DESYREL ) tablet 25 mg (has no administration in time range)  magnesium  hydroxide (MILK OF MAGNESIA) suspension 30 mL (has no administration in time range)   ondansetron  (ZOFRAN ) tablet 4 mg (has no administration in time range)    Or  ondansetron  (ZOFRAN ) injection 4 mg (has no administration in time range)  ipratropium-albuterol  (DUONEB) 0.5-2.5 (3) MG/3ML nebulizer solution 3 mL (3 mLs Nebulization Not Given 07/10/24 2135)  insulin  aspart (novoLOG ) injection 0-20 Units ( Subcutaneous Not Given 07/10/24 2215)  insulin  aspart (novoLOG ) injection 0-5 Units ( Subcutaneous Not Given 07/10/24 2215)  guaiFENesin  (MUCINEX ) 12 hr tablet 600 mg (600 mg Oral Given 07/10/24 2143)  chlorpheniramine-HYDROcodone  (TUSSIONEX) 10-8 MG/5ML suspension 5 mL (has no administration in time range)  ipratropium-albuterol  (DUONEB) 0.5-2.5 (3) MG/3ML nebulizer solution 3 mL (has no administration in time range)  furosemide  (LASIX ) injection 40 mg (has no administration in time range)  ipratropium-albuterol  (DUONEB) 0.5-2.5 (3) MG/3ML nebulizer solution 3 mL (3 mLs Nebulization Given 07/10/24 1615)  methylPREDNISolone  sodium succinate (SOLU-MEDROL ) 125 mg/2 mL injection 125 mg (125 mg Intravenous Given 07/10/24  1637)  ipratropium-albuterol  (DUONEB) 0.5-2.5 (3) MG/3ML nebulizer solution 3 mL (3 mLs Nebulization Given 07/10/24 1809)     IMPRESSION / MDM / ASSESSMENT AND PLAN / ED COURSE  I reviewed the triage vital signs and the nursing notes.                              DDX/MDM/AP: Differential diagnosis includes, but is not limited to, COPD exacerbation, pneumonia, viral syndrome including COVID-19 or influenza, consider CHF exacerbation.  Plan: - Labs - DuoNeb - Chest x-ray - Supplemental oxygen  - Cardiac monitor - Reassess, low threshold for admission  Patient's presentation is most consistent with acute presentation with potential threat to life or bodily function.  The patient is on the cardiac monitor to evaluate for evidence of arrhythmia and/or significant heart rate changes.  ED course below.  Improving with treatment for COPD exacerbation with  serial DuoNebs and IV methylprednisolone , persistent wheezing though work of breathing notably improved.  Remains hypoxic when placed on his usual 1.5 L nasal cannula.  BNP elevated here, chest x-ray with no obvious vascular congestion though there is a small pleural effusion, can consider diuresis.  Deferred antibiotics given no white count, fever and no obvious consolidation on chest x-ray.  Clinical Course as of 07/10/24 2243  Tue Jul 10, 2024  1642 Chest x-ray with right lower lobe consolidation vs effusion on my interpretation, formal read pending [MM]  1644 Vbg wnl [MM]  1656 CXR: IMPRESSION: Stable right upper lobe opacity most consistent with scarring. Probable small right pleural effusion is noted. Bibasilar reticular densities are noted concerning for edema or scarring.   [MM]  1756 CBC with elevated hemoglobin to 18.3, consider hemoconcentration.  New mild thrombocytopenia.  No leukocytosis/leukopenia [MM]  1757 Viral swab neg [MM]  1757 Lactate wnl [MM]  1801 Patient reevaluated, work of breathing improved.  Satting at 95% on 4 L nasal cannula, turned down to what he tells me is his regular amount (1.5 L nasal cannula), remains satting 92-93.  Repeat lung exam now with notable wheezing bilaterally and end expiratory phase--Will give further DuoNeb treatment.  Presentation most consistent with COPD exacerbation at this time, does appear to be improving. [MM]  1813 Patient desatted to 82% on 1.5 L nasal cannula, getting second DuoNeb now, oxygen  increased again.  Doing well on nasal cannula, VBG reassuring, no indication for BiPAP at this time.  CMP pending [MM]  2017 CMP reviewed, unremarkable, Cr at baseline [MM]  2018 Hospitalist consult order placed [MM]    Clinical Course User Index [MM] Clarine Ozell LABOR, MD     FINAL CLINICAL IMPRESSION(S) / ED DIAGNOSES   Final diagnoses:  COPD exacerbation (HCC)  Acute on chronic hypoxic respiratory failure (HCC)     Rx / DC  Orders   ED Discharge Orders     None        Note:  This document was prepared using Dragon voice recognition software and may include unintentional dictation errors.   Clarine Ozell LABOR, MD 07/10/24 [phone]

## 2024-07-10 NOTE — ED Notes (Signed)
 CBG 158 RN made aware

## 2024-07-10 NOTE — Assessment & Plan Note (Signed)
Will continue allopurinol. 

## 2024-07-10 NOTE — Assessment & Plan Note (Signed)
 Will continue statin therapy

## 2024-07-10 NOTE — Assessment & Plan Note (Signed)
-   Will continue antihypertensive therapy.

## 2024-07-10 NOTE — Assessment & Plan Note (Addendum)
-   The patient will be admitted to a medically monitored bed. - We will place the patient IV steroid therapy with IV Solu-Medrol  as well as nebulized bronchodilator therapy with duonebs q.i.d. and q.4 hours p.r.n.Aaron Aas - Mucolytic therapy will be provided with Mucinex  and antibiotic therapy with IV Rocephin . - O2 protocol will be followed. - Will hold off long-acting beta agonist.

## 2024-07-10 NOTE — ED Notes (Signed)
 Lab notified this RN that previously drawn blood hemolyzed for the 2nd time. This RN requested for lab to come draw blood, Lab agreed

## 2024-07-11 ENCOUNTER — Inpatient Hospital Stay

## 2024-07-11 ENCOUNTER — Inpatient Hospital Stay
Admit: 2024-07-11 | Discharge: 2024-07-11 | Disposition: A | Discharge status: Home / Self Care | Attending: Internal Medicine | Admitting: Internal Medicine

## 2024-07-11 DIAGNOSIS — J441 Chronic obstructive pulmonary disease with (acute) exacerbation: Secondary | ICD-10-CM | POA: Diagnosis not present

## 2024-07-11 DIAGNOSIS — I5031 Acute diastolic (congestive) heart failure: Secondary | ICD-10-CM | POA: Diagnosis not present

## 2024-07-11 LAB — CBC
HCT: 54.9 % — ABNORMAL HIGH (ref 39.0–52.0)
Hemoglobin: 17 g/dL (ref 13.0–17.0)
MCH: 31.4 pg (ref 26.0–34.0)
MCHC: 31 g/dL (ref 30.0–36.0)
MCV: 101.3 fL — ABNORMAL HIGH (ref 80.0–100.0)
Platelets: 129 K/uL — ABNORMAL LOW (ref 150–400)
RBC: 5.42 MIL/uL (ref 4.22–5.81)
RDW: 13.1 % (ref 11.5–15.5)
WBC: 5.2 K/uL (ref 4.0–10.5)
nRBC: 0 % (ref 0.0–0.2)

## 2024-07-11 LAB — BASIC METABOLIC PANEL WITH GFR
Anion gap: 9 (ref 5–15)
BUN: 36 mg/dL — ABNORMAL HIGH (ref 8–23)
CO2: 25 mmol/L (ref 22–32)
Calcium: 8.3 mg/dL — ABNORMAL LOW (ref 8.9–10.3)
Chloride: 103 mmol/L (ref 98–111)
Creatinine, Ser: 3.06 mg/dL — ABNORMAL HIGH (ref 0.61–1.24)
GFR, Estimated: 21 mL/min — ABNORMAL LOW (ref >60)
Glucose, Bld: 196 mg/dL — ABNORMAL HIGH (ref 70–99)
Potassium: 5 mmol/L (ref 3.5–5.1)
Sodium: 137 mmol/L (ref 135–145)

## 2024-07-11 LAB — GLUCOSE, CAPILLARY
Glucose-Capillary: 152 mg/dL — ABNORMAL HIGH (ref 70–99)
Glucose-Capillary: 174 mg/dL — ABNORMAL HIGH (ref 70–99)
Glucose-Capillary: 172 mg/dL — ABNORMAL HIGH (ref 70–99)
Glucose-Capillary: 151 mg/dL — ABNORMAL HIGH (ref 70–99)

## 2024-07-11 LAB — TROPONIN T, HIGH SENSITIVITY
Troponin T High Sensitivity: 32 ng/L — ABNORMAL HIGH (ref 0–19)
Troponin T High Sensitivity: 31 ng/L — ABNORMAL HIGH (ref 0–19)

## 2024-07-11 LAB — LACTIC ACID, PLASMA: Lactic Acid, Venous: 1.5 mmol/L (ref 0.5–1.9)

## 2024-07-11 MED ORDER — FUROSEMIDE 10 MG/ML IJ SOLN
INTRAMUSCULAR | Status: AC
  Filled 2024-07-11: qty 4

## 2024-07-11 MED ORDER — FLUTICASONE FUROATE-VILANTEROL 200-25 MCG/ACT IN AEPB
1.0000 | INHALATION_SPRAY | Freq: Every day | RESPIRATORY_TRACT | Status: DC
  Administered 2024-07-11 – 2024-07-22 (×12): 1 via RESPIRATORY_TRACT
  Filled 2024-07-11 (×3): qty 28

## 2024-07-11 MED ORDER — FUROSEMIDE 10 MG/ML IJ SOLN
INTRAMUSCULAR | Status: AC
  Filled 2024-07-11: qty 2

## 2024-07-11 MED ORDER — IPRATROPIUM-ALBUTEROL 0.5-2.5 (3) MG/3ML IN SOLN
3.0000 mL | Freq: Three times a day (TID) | RESPIRATORY_TRACT | Status: DC
  Administered 2024-07-11 – 2024-07-20 (×27): 3 mL via RESPIRATORY_TRACT
  Filled 2024-07-11 (×27): qty 3

## 2024-07-11 NOTE — Plan of Care (Signed)

## 2024-07-11 NOTE — Progress Notes (Signed)
 TOC Brief Assessment Note  Patient admitted from home. Receiving IV abx and duo nebs for COPD exacerbation and being diuresed with Lasix  for CHF. Therapy recs are pending at this time. TOC will continue to follow.

## 2024-07-11 NOTE — Progress Notes (Addendum)
 Progress Note   Patient: Martin Jennings FMW:[id number] DOB: Oct 24, 1950 DOA: 07/10/2024     1 DOS: the patient was seen and examined on 07/11/2024   Brief hospital course:  Martin Jennings is a 73 y.o. male with medical history significant for COPD, type 2 diabetes mellitus, PVD, IBD and bronchiectasis, as well as lung cancer, essential hypertension, dyslipidemia, and gout, who presented to the emergency room with acute onset of worsening dyspnea over the last week with associated cough with excessive clear mucus production.  He has been having lightheadedness when attempting to walk to the bathroom.  He admitted to chills without reported fever.  The patient admitted to orthopnea and paroxysmal nocturnal dyspnea as well as dyspnea on exertion with lower extremity edema.  No fever or chills.  No nausea or vomiting or abdominal pain.  No dysuria, oliguria or hematuria or flank pain.  He denied any recent sick contacts.  He is at 1 L of O2 by nasal cannula at baseline.  Off of oxygen  he was satting in the 18s with EMS required 4 L of O2 by nasal cannula to bring his sats to the low 90s upon arrival to the ED.  No chest pain or palpitations.     Assessment and Plan:  Acute on chronic respiratory failure with hypoxia Acute COPD exacerbation Patient presents to the ER for evaluation of worsening shortness of breath from his baseline Was noted to have room air pulse oximetry in the 70's with tachypnea requiring oxygen  supplementation at 4 L to maintain pulse oximetry greater than 92%  At baseline patient wears 2 L of oxygen  continuous Remains on 4 L via nasal canula to maintain pulse oximetry greater than 92% Worsening respiratory failure appears to be secondary to acute COPD exacerbation Continue systemic and inhaled steroids Continue bronchodilator therapy Continue IV antibiotic therapy with Rocephin  Continue oxygen  supplementation to maintain pulse oximetry greater than 92%    Diabetes  mellitus with complications of stage IV chronic kidney disease Continue sliding scale insulin  Monitor renal function closely   Chronic diastolic dysfunction CHF Does not appear to be acutely exacerbated at this time Last known LVEF of 55 to 60% from a 2D echocardiogram which was done 01/24 Hold furosemide  for now due to worsening renal function. 2.76 >> 3.06 Continue Jardiance  and amlodipine    History of lung cancer History of stage III adenocarcinoma of the lung status post chemo/radiation as well as maintenance durvalumab  Currently in remission Follow-up with oncology for surveillance  History of coronary artery disease Elevated troponin Troponin is flat and is most likely secondary to demand ischemia from tachypnea and respiratory distress Obtain 2D echocardiogram to assess LVEF and rule out regional wall motion abnormality Continue Plavix  and atorvastatin         Subjective: Feels better.  Less short of breath  Physical Exam: Vitals:   07/11/24 0715 07/11/24 0751 07/11/24 1136 07/11/24 1140  BP:  126/71 136/69   Pulse:  73 72 77  Resp:  (!) 23 16   Temp:  98.3 F (36.8 C) 97.7 F (36.5 C)   TempSrc:  Oral Oral   SpO2: 92% 94% (!) 88% 90%  Weight:      Height:       GENERAL:  73 y.o.-year-old Caucasian male patient lying in the bed appears comfortable and in no acute EYES: Pupils equal, round, reactive to light and accommodation. No scleral icterus. Extraocular muscles intact.  HEENT: Head atraumatic, normocephalic. Oropharynx and nasopharynx clear.  NECK:  Supple, no jugular venous distention. No thyroid  enlargement, no tenderness.  LUNGS: Diffuse expiratory wheezes with tight expiratory airflow and harsh vesicular breathing with diminished bibasilar breath sounds and bibasilar rales. No use of accessory muscles of respiration.  CARDIOVASCULAR: Regular rate and rhythm, S1, S2 normal. No murmurs, rubs, or gallops.  ABDOMEN: Soft, nondistended, nontender. Bowel  sounds present. No organomegaly or mass.  EXTREMITIES: Trace bilateral lower extremity pitting edema with no cyanosis, or clubbing.  NEUROLOGIC: Cranial nerves II through XII are intact. Muscle strength 5/5 in all extremities. Sensation intact. Gait not checked.  PSYCHIATRIC: The patient is alert and oriented x 3.  Normal affect and good eye contact. SKIN: No obvious rash, lesion, or ulcer.    Data Reviewed: Labs reviewed.  BUN 36, creatinine 3.06 Labs reviewed  Family Communication: Plan of care discussed with patient in detail.  He verbalizes understanding and agrees with the plan  Disposition: Status is: Inpatient Remains inpatient appropriate because: Continues to have a high oxygen  requirement  Planned Discharge Destination: TBD  Time spent: 50 minutes  Author: Aimee Somerset, MD 07/11/2024 11:44 AM  For on call review [email or web address].

## 2024-07-12 ENCOUNTER — Other Ambulatory Visit: Payer: Self-pay

## 2024-07-12 DIAGNOSIS — J441 Chronic obstructive pulmonary disease with (acute) exacerbation: Secondary | ICD-10-CM | POA: Diagnosis not present

## 2024-07-12 LAB — GLUCOSE, CAPILLARY
Glucose-Capillary: 158 mg/dL — ABNORMAL HIGH (ref 70–99)
Glucose-Capillary: 143 mg/dL — ABNORMAL HIGH (ref 70–99)
Glucose-Capillary: 165 mg/dL — ABNORMAL HIGH (ref 70–99)
Glucose-Capillary: 131 mg/dL — ABNORMAL HIGH (ref 70–99)

## 2024-07-12 LAB — ECHOCARDIOGRAM COMPLETE
Area-P 1/2: 3.99 cm2
Height: 71 in
S' Lateral: 2.7 cm
Weight: 2924.18 [oz_av]

## 2024-07-12 MED ORDER — SODIUM CHLORIDE 0.9 % IV SOLN
100.0000 mg | Freq: Two times a day (BID) | INTRAVENOUS | Status: DC
  Filled 2024-07-12 (×2): qty 100

## 2024-07-12 MED ORDER — SODIUM CHLORIDE 0.9 % IV SOLN
100.0000 mg | Freq: Two times a day (BID) | INTRAVENOUS | Status: DC
  Administered 2024-07-12 – 2024-07-14 (×5): 100 mg via INTRAVENOUS
  Filled 2024-07-12 (×6): qty 100

## 2024-07-12 NOTE — Plan of Care (Signed)

## 2024-07-12 NOTE — Consult Note (Signed)
 PULMONOLOGY         Date: 07/12/2024,   MRN# [id number] Martin Jennings 1951-02-11     AdmissionWeight: 82.9 kg                 CurrentWeight: 82.9 kg  Referring provider: Dr Lanetta   CHIEF COMPLAINT:   Acute on chronic hypoxemic respiratory failure   HISTORY OF PRESENT ILLNESS   Martin Jennings is a 73 y.o. male with medical history significant for COPD, type 2 diabetes mellitus, PVD, IBD and bronchiectasis, as well as lung cancer, essential hypertension, dyslipidemia, and gout, who presented to the emergency room with acute onset of worsening dyspnea over the last week with associated cough with excessive clear mucus production.  He has been having lightheadedness when attempting to walk to the bathroom.  He admitted to chills without reported fever.  The patient admitted to orthopnea and paroxysmal nocturnal dyspnea as well as dyspnea on exertion with lower extremity edema.  No fever or chills.  No nausea or vomiting or abdominal pain.  No dysuria, oliguria or hematuria or flank pain.  He denied any recent sick contacts.  He is at 1 L of O2 by nasal cannula at baseline.  Off of oxygen  he was satting in the 53s with EMS required 4 L of O2 by nasal cannula to bring his sats to the low 90s upon arrival to the ED.  No chest pain or palpitations. He had chest imaging done with findings of mod right pleural effusion on right via CT chest. There is associated compressive atelectasis. There are also fibrotic changes on right lung consistent with post treatment changes. PCCM consultation placed for additional evaluation and management of complex right lung process in patient with lung cancer and copd.   PAST MEDICAL HISTORY   Past Medical History:  Diagnosis Date   Acute respiratory failure with hypoxia (HCC) 07/29/2022   AKI (acute kidney injury) 04/04/2020   Bladder cancer (HCC) 2012   CAP (community acquired pneumonia) 04/04/2020   COPD suggested by initial evaluation 10/11/2022    COVID-19 virus infection 04/03/2020   Goals of care, counseling/discussion 05/19/2020   Hypertension    Influenza A with pneumonia 07/29/2022   Leg pain 01/25/2017   Lung cancer (HCC)    Malignant neoplasm of bladder, unspecified (HCC) 12/17/2010   Peripheral vascular disease    Sepsis (HCC) 07/29/2022     SURGICAL HISTORY   Past Surgical History:  Procedure Laterality Date   BLADDER REMOVAL     LOWER EXTREMITY ANGIOGRAPHY Left 04/12/2017   Procedure: Lower Extremity Angiography;  Surgeon: Jama Cordella MATSU, MD;  Location: [facility];  Service: Cardiovascular;  Laterality: Left;   PORTA CATH INSERTION N/A 05/22/2020   Procedure: PORTA CATH INSERTION;  Surgeon: Marea Selinda RAMAN, MD;  Location: [facility];  Service: Cardiovascular;  Laterality: N/A;   PORTA CATH REMOVAL N/A 11/15/2022   Procedure: PORTA CATH REMOVAL;  Surgeon: Marea Selinda RAMAN, MD;  Location: [facility];  Service: Cardiovascular;  Laterality: N/A;   uretostomy     VASCULAR SURGERY       FAMILY HISTORY   History reviewed. No pertinent family history.   SOCIAL HISTORY   Social History[1]   MEDICATIONS    Home Medication:    Current Medication: Current Medications[2]    ALLERGIES   Patient has no known allergies.     REVIEW OF SYSTEMS    Review of Systems:  Gen:  Denies  fever, sweats, chills weigh loss  HEENT: Denies blurred vision, double vision, ear pain, eye pain, hearing loss, nose bleeds, sore throat Cardiac:  No dizziness, chest pain or heaviness, chest tightness,edema Resp:   reports dyspnea chronically  Gi: Denies swallowing difficulty, stomach pain, nausea or vomiting, diarrhea, constipation, bowel incontinence Gu:  Denies bladder incontinence, burning urine Ext:   Denies Joint pain, stiffness or swelling Skin: Denies  skin rash, easy bruising or bleeding or hives Endoc:  Denies polyuria, polydipsia , polyphagia or weight change Psych:   Denies  depression, insomnia or hallucinations   Other:  All other systems negative   VS: BP (!) 147/76 (BP Location: Right Arm)   Pulse 88   Temp 97.8 F (36.6 C) (Oral)   Resp 15   Ht 5' 11 (1.803 m)   Wt 82.9 kg   SpO2 (!) 87%   BMI 25.49 kg/m      PHYSICAL EXAM    GENERAL:NAD, no fevers, chills, no weakness no fatigue HEAD: Normocephalic, atraumatic.  EYES: Pupils equal, round, reactive to light. Extraocular muscles intact. No scleral icterus.  MOUTH: Moist mucosal membrane. Dentition intact. No abscess noted.  EAR, NOSE, THROAT: Clear without exudates. No external lesions.  NECK: Supple. No thyromegaly. No nodules. No JVD.  PULMONARY: decreased breath sounds with mild rhonchi worse at bases bilaterally.  CARDIOVASCULAR: S1 and S2. Regular rate and rhythm. No murmurs, rubs, or gallops. No edema. Pedal pulses 2+ bilaterally.  GASTROINTESTINAL: Soft, nontender, nondistended. No masses. Positive bowel sounds. No hepatosplenomegaly.  MUSCULOSKELETAL: No swelling, clubbing, or edema. Range of motion full in all extremities.  NEUROLOGIC: Cranial nerves II through XII are intact. No gross focal neurological deficits. Sensation intact. Reflexes intact.  SKIN: No ulceration, lesions, rashes, or cyanosis. Skin warm and dry. Turgor intact.  PSYCHIATRIC: Mood, affect within normal limits. The patient is awake, alert and oriented x 3. Insight, judgment intact.       IMAGING   Narrative & Impression  CLINICAL DATA:  Concern for pneumonia.   EXAM: CT CHEST WITHOUT CONTRAST   TECHNIQUE: Multidetector CT imaging of the chest was performed following the standard protocol without IV contrast.   RADIATION DOSE REDUCTION: This exam was performed according to the departmental dose-optimization program which includes automated exposure control, adjustment of the mA and/or kV according to patient size and/or use of iterative reconstruction technique.   COMPARISON:  Chest radiograph dated  07/10/2024 and CT dated 09/10/2022.   FINDINGS: Evaluation of this exam is limited in the absence of intravenous contrast.   Cardiovascular: There is mild cardiomegaly. No pericardial effusion. Advanced 3 vessel coronary vascular calcification. Mild atherosclerotic calcification of the thoracic aorta. No aneurysmal dilatation. There is dilatation of the main pulmonary trunk suggestive of pulmonary hypertension.   Mediastinum/Nodes: No obvious hilar adenopathy. Evaluation however is limited due to consolidative changes of the lungs. The esophagus is grossly unremarkable no mediastinal fluid collection.   Lungs/Pleura: Moderate right pleural effusion. There is partial compressive atelectasis of the right lower lobe versus pneumonia. Background of emphysema and diffuse interstitial coarsening. Progression of bandlike consolidation extending from the right hilum into the anterior right upper lobe since the prior CT likely representing post treatment fibrosis. Pneumonia is not excluded. Additional area of consolidation in the lateral right middle lobe also concerning for pneumonia. No pneumothorax. The central airways are patent.   Upper Abdomen: No acute abnormality.   Musculoskeletal: No acute osseous pathology.   IMPRESSION: 1. Moderate right pleural effusion with  partial compressive atelectasis of the right lower lobe versus pneumonia. 2. Progression of bandlike consolidation extending from the right hilum into the anterior right upper lobe since the prior CT likely representing post treatment fibrosis. Pneumonia is not excluded. 3. Additional area of consolidation in the lateral right middle lobe also concerning for pneumonia. 4. Mild cardiomegaly with advanced 3 vessel coronary vascular calcification. 5. Aortic Atherosclerosis (ICD10-I70.0) and Emphysema (ICD10-J43.9).     Electronically Signed   By: Vanetta Chou M.D.   On: 07/11/2024 16:20    ASSESSMENT/PLAN    Acute on chronic hypoxemic respiratory failure   Multifactorial in etiology including possible pneumonia of right lung, Cancer of right lung, moderate pleural effusion and atelectasis of right lower lobe.   - will target each underlying cause for improvement in oxygenation    Moderate right pleural effusion     - thoracentesis with pleural fluid studies    - IR consult with testing placed.    COPD with acute exacerbation      - agree with current COPD carepath     - Doxycycline  IV bid with rocephin  empirically for CAP coverage     - steroids - soluemdrol is now being tapered to prednisone      - PT/OT chest PT    - nebulizer therapy with duoneb and inhaler therapy with Breo ellipta    Compressive atelectasis  Patient weak and unable to use incentive spirometry very well,  I have ordered metaneb therapy with respiratory therapist PT/OT     Physical deconditioning  - [facility]n would [facility]        Thank you for allowing me to participate in the care of this patient.   Patient/Family are satisfied with care plan and all questions have been answered.    Provider disclosure: Patient with at least one acute or chronic illness or injury that poses a threat to life or bodily function and is being managed actively during this encounter.  All of the below services have been performed independently by signing provider:  review of prior documentation from internal and or external health records.  Review of previous and current lab results.  Interview and comprehensive assessment during patient visit today. Review of current and previous chest radiographs/CT scans. Discussion of management and test interpretation with health care team and patient/family.   This document was prepared using Dragon voice recognition software and may include unintentional dictation errors.     Mallorey Odonell, M.D.  Division of Pulmonary & Critical Care Medicine              [1]  Social History Tobacco Use   Smoking status: Former    Current packs/day: 0.00    Average packs/day: 0.5 packs/day for 50.0 years (25.0 ttl pk-yrs)    Types: Cigarettes    Quit date: 08/2023    Years since quitting: 0.8   Smokeless tobacco: Never   Tobacco comments:    Per pt. He smokes 1 pack every 3 days  Vaping Use   Vaping status: Never Used  Substance Use Topics   Alcohol use: No   Drug use: Not Currently    Types: Marijuana  [2]  Current Facility-Administered Medications:    acetaminophen  (TYLENOL ) tablet 650 mg, 650 mg, Oral, Q6H PRN **OR** acetaminophen  (TYLENOL ) suppository 650 mg, 650 mg, Rectal, Q6H PRN, Mansy, Jan A, MD   allopurinol  (ZYLOPRIM ) tablet 100 mg, 100 mg, Oral, BID, Mansy, Jan A, MD, 100 mg at 07/12/24 [id number]  amLODipine  (NORVASC ) tablet 10 mg, 10 mg, Oral, Daily, Mansy, Jan A, MD, 10 mg at 07/12/24 9176   atorvastatin  (LIPITOR) tablet 20 mg, 20 mg, Oral, QPM, Mansy, Jan A, MD, 20 mg at 07/11/24 1656   cefTRIAXone  (ROCEPHIN ) 1 g in sodium chloride  0.9 % 100 mL IVPB, 1 g, Intravenous, Q24H, Mansy, Jan A, MD, Stopped at 07/11/24 2220   chlorpheniramine-HYDROcodone  (TUSSIONEX) 10-8 MG/5ML suspension 5 mL, 5 mL, Oral, Q12H PRN, Mansy, Jan A, MD, 5 mL at 07/12/24 0607   clopidogrel  (PLAVIX ) tablet 75 mg, 75 mg, Oral, Daily, Mansy, Jan A, MD, 75 mg at 07/12/24 9176   doxycycline  (VIBRAMYCIN ) 100 mg in sodium chloride  0.9 % 250 mL IVPB, 100 mg, Intravenous, BID, Agbata, Tochukwu, MD   empagliflozin  (JARDIANCE ) tablet 10 mg, 10 mg, Oral, Daily, Mansy, Jan A, MD, 10 mg at 07/12/24 0825   enoxaparin  (LOVENOX ) injection 30 mg, 30 mg, Subcutaneous, Q24H, Mansy, Jan A, MD, 30 mg at 07/11/24 2150   fluticasone  furoate-vilanterol (BREO ELLIPTA ) 200-25 MCG/ACT 1 puff, 1 puff, Inhalation, Daily, Agbata, Tochukwu, MD, 1 puff at 07/12/24 9177   guaiFENesin  (MUCINEX ) 12 hr tablet 600 mg, 600 mg, Oral, BID, Mansy, Jan A, MD, 600 mg at 07/12/24 9171    insulin  aspart (novoLOG ) injection 0-20 Units, 0-20 Units, Subcutaneous, TID WC, Mansy, Jan A, MD, 3 Units at 07/12/24 1259   insulin  aspart (novoLOG ) injection 0-5 Units, 0-5 Units, Subcutaneous, QHS, Mansy, Jan A, MD   ipratropium-albuterol  (DUONEB) 0.5-2.5 (3) MG/3ML nebulizer solution 3 mL, 3 mL, Nebulization, Q4H PRN, Mansy, Jan A, MD   ipratropium-albuterol  (DUONEB) 0.5-2.5 (3) MG/3ML nebulizer solution 3 mL, 3 mL, Nebulization, TID, Agbata, Tochukwu, MD, 3 mL at 07/12/24 0808   loratadine  (CLARITIN ) tablet 10 mg, 10 mg, Oral, Daily, Mansy, Jan A, MD, 10 mg at 07/12/24 9176   magnesium  hydroxide (MILK OF MAGNESIA) suspension 30 mL, 30 mL, Oral, Daily PRN, Mansy, Jan A, MD   ondansetron  (ZOFRAN ) tablet 4 mg, 4 mg, Oral, Q6H PRN **OR** ondansetron  (ZOFRAN ) injection 4 mg, 4 mg, Intravenous, Q6H PRN, Mansy, Madison LABOR, MD   [COMPLETED] methylPREDNISolone  sodium succinate (SOLU-MEDROL ) 40 mg/mL injection 40 mg, 40 mg, Intravenous, Q12H, 40 mg at 07/11/24 1955 **FOLLOWED BY** predniSONE  (DELTASONE ) tablet 40 mg, 40 mg, Oral, Q breakfast, Mansy, Jan A, MD, 40 mg at 07/12/24 9176   traZODone  (DESYREL ) tablet 25 mg, 25 mg, Oral, QHS PRN, Mansy, Jan A, MD, 25 mg at 07/11/24 2156  Facility-Administered Medications Ordered in Other Encounters:    sodium chloride  flush (NS) 0.9 % injection 10 mL, 10 mL, Intravenous, PRN, Melanee Annah BROCKS, MD, 10 mL at 09/18/20 [phone]

## 2024-07-12 NOTE — Care Management Important Message (Signed)
 Important Message  Patient Details  Name: Martin Jennings MRN: [id number] Date of Birth: August 12, 1950   Important Message Given:  Yes - Medicare IM     Michal Strzelecki W, CMA 07/12/2024, 1:35 PM

## 2024-07-12 NOTE — Progress Notes (Signed)
 Progress Note   Patient: Martin Jennings FMW:[id number] DOB: [birth year] DOA: 07/10/2024     2 DOS: the patient was seen and examined on 07/12/2024   Brief hospital course:  RITVIK MCZEAL is a 73 y.o. male with medical history significant for COPD, type 2 diabetes mellitus, PVD, IBD and bronchiectasis, as well as lung cancer, essential hypertension, dyslipidemia, and gout, who presented to the emergency room with acute onset of worsening dyspnea over the last week with associated cough with excessive clear mucus production.  He has been having lightheadedness when attempting to walk to the bathroom.  He admitted to chills without reported fever.  The patient admitted to orthopnea and paroxysmal nocturnal dyspnea as well as dyspnea on exertion with lower extremity edema.  No fever or chills.  No nausea or vomiting or abdominal pain.  No dysuria, oliguria or hematuria or flank pain.  He denied any recent sick contacts.  He is at 1 L of O2 by nasal cannula at baseline.  Off of oxygen  he was satting in the 18s with EMS required 4 L of O2 by nasal cannula to bring his sats to the low 90s upon arrival to the ED.  No chest pain or palpitations.       Assessment and Plan:  Acute on chronic respiratory failure with hypoxia Acute COPD exacerbation Community-acquired pneumonia Patient presents to the ER for evaluation of worsening shortness of breath from his baseline Was noted to have room air pulse oximetry in the 70's with tachypnea requiring oxygen  supplementation at 4 L to maintain pulse oximetry greater than 92%  At baseline patient wears 2 L of oxygen  continuous Remains on 4 L via nasal canula to maintain pulse oximetry greater than 92% Worsening respiratory failure appears to be secondary to acute COPD exacerbation as well as community-acquired pneumonia Continue systemic and inhaled steroids Continue bronchodilator therapy Continue IV antibiotic therapy with Rocephin  and add  doxycycline  Continue oxygen  supplementation to maintain pulse oximetry greater than 92% Pulmonary consult for CT findings of moderate right pleural effusion with partial compressive atelectasis of the right lower lobe versus pneumonia.Progression of bandlike consolidation extending from the right hilum into the anterior right upper lobe since the prior CT likely representing post treatment fibrosis. Pneumonia is not excluded. Additional area of consolidation in the lateral right middle lobe also concerning for pneumonia.       Diabetes mellitus with complications of stage IV chronic kidney disease Continue sliding scale insulin  Monitor renal function closely     Chronic diastolic dysfunction CHF Does not appear to be acutely exacerbated at this time Last known LVEF of 55 to 60% from a 2D echocardiogram which was done 01/24 Hold furosemide  for now due to worsening renal function. 2.76 >> 3.06 Continue Jardiance  and amlodipine      History of lung cancer History of stage III adenocarcinoma of the lung status post chemo/radiation as well as maintenance durvalumab  Currently in remission Follow-up with oncology for surveillance   History of coronary artery disease Elevated troponin Troponin is flat and is most likely secondary to demand ischemia from tachypnea and respiratory distress Awaiting results of  2D echocardiogram to assess LVEF and rule out regional wall motion abnormality Continue Plavix  and atorvastatin              Subjective: Has a wet sounding cough productive of clear phlegm.  Feels better  Physical Exam: Vitals:   07/11/24 2031 07/12/24 0026 07/12/24 0537 07/12/24 0739  BP: 128/64 (!) 143/70 (!) 141/73  139/75  Pulse: 83 88 93 84  Resp: 15 16  16   Temp: 98 F (36.7 C) 98.3 F (36.8 C) 97.9 F (36.6 C) 97.6 F (36.4 C)  TempSrc:      SpO2: 93% 93% 92% 91%  Weight:      Height:       GENERAL:  73 y.o.-year-old Caucasian male patient lying in the bed  appears comfortable and in no acute EYES: Pupils equal, round, reactive to light and accommodation. No scleral icterus. Extraocular muscles intact.  HEENT: Head atraumatic, normocephalic. Oropharynx and nasopharynx clear.  NECK:  Supple, no jugular venous distention. No thyroid  enlargement, no tenderness.  LUNGS: Bilateral air entry, scattered rhonchi and wheezes CARDIOVASCULAR: Regular rate and rhythm, S1, S2 normal. No murmurs, rubs, or gallops.  ABDOMEN: Soft, nondistended, nontender. Bowel sounds present. No organomegaly or mass.  EXTREMITIES: No pedal edema NEUROLOGIC: Cranial nerves II through XII are intact. Muscle strength 5/5 in all extremities. Sensation intact. Gait not checked.  PSYCHIATRIC: The patient is alert and oriented x 3.  Normal affect and good eye contact. SKIN: No obvious rash, lesion, or ulcer.   Data Reviewed:  There are no new results to review at this time.  Family Communication: Plan of care was discussed with patient in detail.  He verbalizes understanding and agrees with the plan  Disposition: Status is: Inpatient Remains inpatient appropriate because: On IV antibiotics  Planned Discharge Destination: TBD    Time spent: 55 minutes  Author: Aimee Somerset, MD 07/12/2024 10:21 AM  For on call review [email or web address].

## 2024-07-12 NOTE — TOC Progression Note (Signed)
 Transition of Care [facility]) - Progression Note    Patient Details  Name: Martin Jennings MRN: [id number] Date of Birth: 11-Feb-1951  Transition of Care [facility]) CM/SW Contact  Dalia GORMAN Fuse, RN Phone Number: 07/12/2024, 11:05 AM  Clinical Narrative:    2 D echo pending to assess LVEF. Renal function worsening 2.76 >> 3.06. Requiring 4 L [place] to keep O2  92%. On IV abx and duo nebs. Patient may potentially have a new O2 need.  TOC will continue to follow.                       Expected Discharge Plan and Services                                               Social Drivers of Health (SDOH) Interventions SDOH Screenings   Food Insecurity: No Food Insecurity (07/10/2024)  Housing: Low Risk (07/10/2024)  Transportation Needs: No Transportation Needs (07/10/2024)  Utilities: Not At Risk (07/10/2024)  Depression (PHQ2-9): Medium Risk (07/03/2024)  Social Connections: Moderately Isolated (07/10/2024)  Stress: No Stress Concern Present (07/03/2024)  Tobacco Use: Medium Risk (07/10/2024)    Readmission Risk Interventions     No data to display

## 2024-07-13 ENCOUNTER — Inpatient Hospital Stay

## 2024-07-13 DIAGNOSIS — J9602 Acute respiratory failure with hypercapnia: Secondary | ICD-10-CM | POA: Diagnosis not present

## 2024-07-13 LAB — EXPECTORATED SPUTUM ASSESSMENT W GRAM STAIN, RFLX TO RESP C

## 2024-07-13 LAB — CBC WITH DIFFERENTIAL/PLATELET
Abs Immature Granulocytes: 0.16 K/uL — ABNORMAL HIGH (ref 0.00–0.07)
Basophils Absolute: 0 K/uL (ref 0.0–0.1)
Basophils Relative: 0 %
Eosinophils Absolute: 0 K/uL (ref 0.0–0.5)
Eosinophils Relative: 0 %
HCT: 54.9 % — ABNORMAL HIGH (ref 39.0–52.0)
Hemoglobin: 16.9 g/dL (ref 13.0–17.0)
Immature Granulocytes: 2 %
Lymphocytes Relative: 6 %
Lymphs Abs: 0.6 K/uL — ABNORMAL LOW (ref 0.7–4.0)
MCH: 31.5 pg (ref 26.0–34.0)
MCHC: 30.8 g/dL (ref 30.0–36.0)
MCV: 102.4 fL — ABNORMAL HIGH (ref 80.0–100.0)
Monocytes Absolute: 0.5 K/uL (ref 0.1–1.0)
Monocytes Relative: 5 %
Neutro Abs: 8.9 K/uL — ABNORMAL HIGH (ref 1.7–7.7)
Neutrophils Relative %: 87 %
Platelets: 103 K/uL — ABNORMAL LOW (ref 150–400)
RBC: 5.36 MIL/uL (ref 4.22–5.81)
RDW: 13 % (ref 11.5–15.5)
WBC: 10.1 K/uL (ref 4.0–10.5)
nRBC: 0 % (ref 0.0–0.2)

## 2024-07-13 LAB — BLOOD GAS, ARTERIAL
Acid-Base Excess: 2.5 mmol/L — ABNORMAL HIGH (ref 0.0–2.0)
Acid-Base Excess: 1.4 mmol/L (ref 0.0–2.0)
Acid-Base Excess: 0.4 mmol/L (ref 0.0–2.0)
Bicarbonate: 32 mmol/L — ABNORMAL HIGH (ref 20.0–28.0)
Bicarbonate: 31.6 mmol/L — ABNORMAL HIGH (ref 20.0–28.0)
Bicarbonate: 29.6 mmol/L — ABNORMAL HIGH (ref 20.0–28.0)
Delivery systems: POSITIVE
Delivery systems: POSITIVE
Expiratory PAP: 8 cmH2O
FIO2: 40 %
FIO2: 40 %
Inspiratory PAP: 12 cmH2O
O2 Content: 7 L/min
O2 Saturation: 86 %
O2 Saturation: 88.4 %
O2 Saturation: 93.4 %
PEEP: 5 cmH2O
Patient temperature: 37
Patient temperature: 37
Patient temperature: 37
Pressure support: 10 cmH2O
pCO2 arterial: 73 mmHg (ref 32–48)
pCO2 arterial: 72 mmHg (ref 32–48)
pCO2 arterial: 69 mmHg (ref 32–48)
pH, Arterial: 7.25 — ABNORMAL LOW (ref 7.35–7.45)
pH, Arterial: 7.25 — ABNORMAL LOW (ref 7.35–7.45)
pH, Arterial: 7.24 — ABNORMAL LOW (ref 7.35–7.45)
pO2, Arterial: 53 mmHg — ABNORMAL LOW (ref 83–108)
pO2, Arterial: 55 mmHg — ABNORMAL LOW (ref 83–108)
pO2, Arterial: 64 mmHg — ABNORMAL LOW (ref 83–108)

## 2024-07-13 LAB — BASIC METABOLIC PANEL WITH GFR
Anion gap: 7 (ref 5–15)
BUN: 52 mg/dL — ABNORMAL HIGH (ref 8–23)
CO2: 27 mmol/L (ref 22–32)
Calcium: 8.4 mg/dL — ABNORMAL LOW (ref 8.9–10.3)
Chloride: 104 mmol/L (ref 98–111)
Creatinine, Ser: 3.25 mg/dL — ABNORMAL HIGH (ref 0.61–1.24)
GFR, Estimated: 19 mL/min — ABNORMAL LOW
Glucose, Bld: 135 mg/dL — ABNORMAL HIGH (ref 70–99)
Potassium: 5 mmol/L (ref 3.5–5.1)
Sodium: 138 mmol/L (ref 135–145)

## 2024-07-13 LAB — GLUCOSE, CAPILLARY
Glucose-Capillary: 129 mg/dL — ABNORMAL HIGH (ref 70–99)
Glucose-Capillary: 110 mg/dL — ABNORMAL HIGH (ref 70–99)
Glucose-Capillary: 132 mg/dL — ABNORMAL HIGH (ref 70–99)
Glucose-Capillary: 160 mg/dL — ABNORMAL HIGH (ref 70–99)
Glucose-Capillary: 108 mg/dL — ABNORMAL HIGH (ref 70–99)

## 2024-07-13 LAB — MAGNESIUM: Magnesium: 2 mg/dL (ref 1.7–2.4)

## 2024-07-13 MED ORDER — FUROSEMIDE 10 MG/ML IJ SOLN
40.0000 mg | Freq: Once | INTRAMUSCULAR | Status: AC
  Administered 2024-07-13: 40 mg via INTRAVENOUS
  Filled 2024-07-13: qty 4

## 2024-07-13 MED ORDER — PREDNISONE 50 MG PO TABS
35.0000 mg | ORAL_TABLET | Freq: Every day | ORAL | Status: DC
  Administered 2024-07-14: 35 mg via ORAL
  Filled 2024-07-13: qty 1

## 2024-07-13 NOTE — Progress Notes (Signed)
 Heart Failure Navigator Progress Note  Per MD note 07/12/24 patient does not appear to have CHF exacerbation at this time.  07/11/24 ECHO- EF 60-65%. Holding Furosemide  for now due to worsening renal function.   Navigator available for reassessment of patient if needed but will sign off at this time.  Charmaine Pines, RN, BSN [facility] Heart Failure Navigator Secure Chat Only

## 2024-07-13 NOTE — Significant Event (Addendum)
 Notified by RN that patient was unable to get the thoracentesis done due to episodes of desaturation.  On 4 L his pulse oximetry was 78% and his oxygen  supplementation was increased to 7 L.  Patient was noted to be somnolent and difficult to arouse.  On 7 L, his pulse oximetry increased to 87%.  Procedure was aborted by IR since patient was unstable. Stat blood gas was obtained which showed uncompensated respiratory acidosis Chest x-ray reviewed by me shows small bilateral pleural effusion Will place patient on BiPAP and will repeat a blood gas in 2 hours Will give a dose of Lasix  40 mg IV x 1 dose Will transfer patient to Progressive for continuous BiPAP and close monitoring Called and updated patient's wife over the phone. All questions and concerns have been addressed.  She verbalizes understanding and agrees with the plan Total critical care time spent on this patient was 40 minutes

## 2024-07-13 NOTE — Progress Notes (Signed)
 Pt is on 5 LPM, O2 is 90%. SN notified Dr. Lanetta, advised to increase to 6L to keep pulse ox at 92 or greater.  When Pt is sleeping his O2 Sats decreases to 88 - 89%. SN notified Dr. Lanetta. Pt is scheduled to have US  Thoracentsis. Will continue to monitor.

## 2024-07-13 NOTE — TOC Progression Note (Signed)
 Transition of Care [facility]) - Progression Note    Patient Details  Name: Martin Jennings MRN: [id number] Date of Birth: 1951-07-16  Transition of Care [facility]) CM/SW Contact  Dalia GORMAN Fuse, RN Phone Number: 07/13/2024, 11:57 AM  Clinical Narrative:     Thoracentesis is not done yet. He is diuresing well now appx 3L. On IV abx for PNA. Requiring 9 L [place].                      Expected Discharge Plan and Services                                               Social Drivers of Health (SDOH) Interventions SDOH Screenings   Food Insecurity: No Food Insecurity (07/10/2024)  Housing: Low Risk (07/10/2024)  Transportation Needs: No Transportation Needs (07/10/2024)  Utilities: Not At Risk (07/10/2024)  Depression (PHQ2-9): Medium Risk (07/03/2024)  Social Connections: Moderately Isolated (07/10/2024)  Stress: No Stress Concern Present (07/03/2024)  Tobacco Use: Medium Risk (07/10/2024)    Readmission Risk Interventions     No data to display

## 2024-07-13 NOTE — Progress Notes (Signed)
 "    PULMONOLOGY         Date: 07/13/2024,   MRN# [id number] Martin Jennings [birth year]     AdmissionWeight: 82.9 kg                 CurrentWeight: 81.1 kg  Referring provider: Dr Lanetta   CHIEF COMPLAINT:   Acute on chronic hypoxemic respiratory failure   HISTORY OF PRESENT ILLNESS   Martin Jennings is a 73 y.o. male with medical history significant for COPD, type 2 diabetes mellitus, PVD, IBD and bronchiectasis, as well as lung cancer, essential hypertension, dyslipidemia, and gout, who presented to the emergency room with acute onset of worsening dyspnea over the last week with associated cough with excessive clear mucus production.  He has been having lightheadedness when attempting to walk to the bathroom.  He admitted to chills without reported fever.  The patient admitted to orthopnea and paroxysmal nocturnal dyspnea as well as dyspnea on exertion with lower extremity edema.  No fever or chills.  No nausea or vomiting or abdominal pain.  No dysuria, oliguria or hematuria or flank pain.  He denied any recent sick contacts.  He is at 1 L of O2 by nasal cannula at baseline.  Off of oxygen  he was satting in the 63s with EMS required 4 L of O2 by nasal cannula to bring his sats to the low 90s upon arrival to the ED.  No chest pain or palpitations. He had chest imaging done with findings of mod right pleural effusion on right via CT chest. There is associated compressive atelectasis. There are also fibrotic changes on right lung consistent with post treatment changes. PCCM consultation placed for additional evaluation and management of complex right lung process in patient with lung cancer and copd.   07/13/24- no overnight events. Patient has not had throacentesis done yet. He is diuresing well now appx 3L net negative. Ive sent a message to Dr Jenna with IR via Epic secure chat to notify regarding thoracentesis order. Patient is on 9L/min [place].  Mr Mariscal is on IV doxy and rocephin  for  CAP tx empirically. Reduced prednisone  to 35mg  PO daily   PAST MEDICAL HISTORY   Past Medical History:  Diagnosis Date   Acute respiratory failure with hypoxia (HCC) 07/29/2022   AKI (acute kidney injury) 04/04/2020   Bladder cancer (HCC) 2012   CAP (community acquired pneumonia) 04/04/2020   COPD suggested by initial evaluation 10/11/2022   COVID-19 virus infection 04/03/2020   Goals of care, counseling/discussion 05/19/2020   Hypertension    Influenza A with pneumonia 07/29/2022   Leg pain 01/25/2017   Lung cancer (HCC)    Malignant neoplasm of bladder, unspecified (HCC) 12/17/2010   Peripheral vascular disease    Sepsis (HCC) 07/29/2022     SURGICAL HISTORY   Past Surgical History:  Procedure Laterality Date   BLADDER REMOVAL     LOWER EXTREMITY ANGIOGRAPHY Left 04/12/2017   Procedure: Lower Extremity Angiography;  Surgeon: Jama Cordella MATSU, MD;  Location: [facility] INVASIVE CV LAB;  Service: Cardiovascular;  Laterality: Left;   PORTA CATH INSERTION N/A 05/22/2020   Procedure: PORTA CATH INSERTION;  Surgeon: Marea Selinda RAMAN, MD;  Location: [facility] INVASIVE CV LAB;  Service: Cardiovascular;  Laterality: N/A;   PORTA CATH REMOVAL N/A 11/15/2022   Procedure: PORTA CATH REMOVAL;  Surgeon: Marea Selinda RAMAN, MD;  Location: [facility] INVASIVE CV LAB;  Service: Cardiovascular;  Laterality: N/A;   uretostomy     VASCULAR SURGERY  FAMILY HISTORY   History reviewed. No pertinent family history.   SOCIAL HISTORY   Social History[1]   MEDICATIONS    Home Medication:    Current Medication: Current Medications[2]    ALLERGIES   Patient has no known allergies.     REVIEW OF SYSTEMS    Review of Systems:  Gen:  Denies  fever, sweats, chills weigh loss  HEENT: Denies blurred vision, double vision, ear pain, eye pain, hearing loss, nose bleeds, sore throat Cardiac:  No dizziness, chest pain or heaviness, chest tightness,edema Resp:   reports dyspnea chronically  Gi: Denies  swallowing difficulty, stomach pain, nausea or vomiting, diarrhea, constipation, bowel incontinence Gu:  Denies bladder incontinence, burning urine Ext:   Denies Joint pain, stiffness or swelling Skin: Denies  skin rash, easy bruising or bleeding or hives Endoc:  Denies polyuria, polydipsia , polyphagia or weight change Psych:   Denies depression, insomnia or hallucinations   Other:  All other systems negative   VS: BP (!) 156/78 (BP Location: Left Arm)   Pulse 83   Temp 97.9 F (36.6 C)   Resp 16   Ht 5' 11 (1.803 m)   Wt 81.1 kg   SpO2 94%   BMI 24.94 kg/m      PHYSICAL EXAM    GENERAL:NAD, no fevers, chills, no weakness no fatigue HEAD: Normocephalic, atraumatic.  EYES: Pupils equal, round, reactive to light. Extraocular muscles intact. No scleral icterus.  MOUTH: Moist mucosal membrane. Dentition intact. No abscess noted.  EAR, NOSE, THROAT: Clear without exudates. No external lesions.  NECK: Supple. No thyromegaly. No nodules. No JVD.  PULMONARY: decreased breath sounds with mild rhonchi worse at bases bilaterally.  CARDIOVASCULAR: S1 and S2. Regular rate and rhythm. No murmurs, rubs, or gallops. No edema. Pedal pulses 2+ bilaterally.  GASTROINTESTINAL: Soft, nontender, nondistended. No masses. Positive bowel sounds. No hepatosplenomegaly.  MUSCULOSKELETAL: No swelling, clubbing, or edema. Range of motion full in all extremities.  NEUROLOGIC: Cranial nerves II through XII are intact. No gross focal neurological deficits. Sensation intact. Reflexes intact.  SKIN: No ulceration, lesions, rashes, or cyanosis. Skin warm and dry. Turgor intact.  PSYCHIATRIC: Mood, affect within normal limits. The patient is awake, alert and oriented x 3. Insight, judgment intact.       IMAGING   Narrative & Impression  CLINICAL DATA:  Concern for pneumonia.   EXAM: CT CHEST WITHOUT CONTRAST   TECHNIQUE: Multidetector CT imaging of the chest was performed following the standard  protocol without IV contrast.   RADIATION DOSE REDUCTION: This exam was performed according to the departmental dose-optimization program which includes automated exposure control, adjustment of the mA and/or kV according to patient size and/or use of iterative reconstruction technique.   COMPARISON:  Chest radiograph dated 07/10/2024 and CT dated 09/10/2022.   FINDINGS: Evaluation of this exam is limited in the absence of intravenous contrast.   Cardiovascular: There is mild cardiomegaly. No pericardial effusion. Advanced 3 vessel coronary vascular calcification. Mild atherosclerotic calcification of the thoracic aorta. No aneurysmal dilatation. There is dilatation of the main pulmonary trunk suggestive of pulmonary hypertension.   Mediastinum/Nodes: No obvious hilar adenopathy. Evaluation however is limited due to consolidative changes of the lungs. The esophagus is grossly unremarkable no mediastinal fluid collection.   Lungs/Pleura: Moderate right pleural effusion. There is partial compressive atelectasis of the right lower lobe versus pneumonia. Background of emphysema and diffuse interstitial coarsening. Progression of bandlike consolidation extending from the right hilum into the anterior right upper  lobe since the prior CT likely representing post treatment fibrosis. Pneumonia is not excluded. Additional area of consolidation in the lateral right middle lobe also concerning for pneumonia. No pneumothorax. The central airways are patent.   Upper Abdomen: No acute abnormality.   Musculoskeletal: No acute osseous pathology.   IMPRESSION: 1. Moderate right pleural effusion with partial compressive atelectasis of the right lower lobe versus pneumonia. 2. Progression of bandlike consolidation extending from the right hilum into the anterior right upper lobe since the prior CT likely representing post treatment fibrosis. Pneumonia is not excluded. 3. Additional area of  consolidation in the lateral right middle lobe also concerning for pneumonia. 4. Mild cardiomegaly with advanced 3 vessel coronary vascular calcification. 5. Aortic Atherosclerosis (ICD10-I70.0) and Emphysema (ICD10-J43.9).     Electronically Signed   By: Vanetta Chou M.D.   On: 07/11/2024 16:20    ASSESSMENT/PLAN   Acute on chronic hypoxemic respiratory failure   Multifactorial in etiology including possible pneumonia of right lung, Cancer of right lung, moderate pleural effusion and atelectasis of right lower lobe.   - will target each underlying cause for improvement in oxygenation    Moderate right pleural effusion     - thoracentesis with pleural fluid studies    - IR consult with testing placed.    COPD with acute exacerbation      - agree with current COPD carepath     - Doxycycline  IV bid with rocephin  empirically for CAP coverage     - steroids - soluemdrol is now being tapered to prednisone      - PT/OT chest PT    - nebulizer therapy with duoneb and inhaler therapy with Breo ellipta    Compressive atelectasis  Patient weak and unable to use incentive spirometry very well,  I have ordered metaneb therapy with respiratory therapist PT/OT     Physical deconditioning  - patietn would benefit [facility]        Thank you for allowing me to participate in the care of this patient.   Patient/Family are satisfied with care plan and all questions have been answered.    Provider disclosure: Patient with at least one acute or chronic illness or injury that poses a threat to life or bodily function and is being managed actively during this encounter.  All of the below services have been performed independently by signing provider:  review of prior documentation from internal and or external health records.  Review of previous and current lab results.  Interview and comprehensive assessment during patient visit today. Review of current  and previous chest radiographs/CT scans. Discussion of management and test interpretation with health care team and patient/family.   This document was prepared using Dragon voice recognition software and may include unintentional dictation errors.     Sophee Mckimmy, M.D.  Division of Pulmonary & Critical Care Medicine              [1]  Social History Tobacco Use   Smoking status: Former    Current packs/day: 0.00    Average packs/day: 0.5 packs/day for 50.0 years (25.0 ttl pk-yrs)    Types: Cigarettes    Quit date: 08/2023    Years since quitting: 0.8   Smokeless tobacco: Never   Tobacco comments:    Per pt. He smokes 1 pack every 3 days  Vaping Use   Vaping status: Never Used  Substance Use Topics   Alcohol use: No   Drug use: Not Currently  Types: Marijuana  [2]  Current Facility-Administered Medications:    acetaminophen  (TYLENOL ) tablet 650 mg, 650 mg, Oral, Q6H PRN **OR** acetaminophen  (TYLENOL ) suppository 650 mg, 650 mg, Rectal, Q6H PRN, Mansy, Jan A, MD   allopurinol  (ZYLOPRIM ) tablet 100 mg, 100 mg, Oral, BID, Mansy, Jan A, MD, 100 mg at 07/13/24 9062   amLODipine  (NORVASC ) tablet 10 mg, 10 mg, Oral, Daily, Mansy, Jan A, MD, 10 mg at 07/13/24 9062   atorvastatin  (LIPITOR) tablet 20 mg, 20 mg, Oral, QPM, Mansy, Jan A, MD, 20 mg at 07/12/24 1734   cefTRIAXone  (ROCEPHIN ) 1 g in sodium chloride  0.9 % 100 mL IVPB, 1 g, Intravenous, Q24H, Mansy, Jan A, MD, Stopped at 07/12/24 2114   chlorpheniramine-HYDROcodone  (TUSSIONEX) 10-8 MG/5ML suspension 5 mL, 5 mL, Oral, Q12H PRN, Mansy, Jan A, MD, 5 mL at 07/12/24 2106   clopidogrel  (PLAVIX ) tablet 75 mg, 75 mg, Oral, Daily, Mansy, Jan A, MD, 75 mg at 07/13/24 9062   doxycycline  (VIBRAMYCIN ) 100 mg in sodium chloride  0.9 % 250 mL IVPB, 100 mg, Intravenous, BID, Agbata, Tochukwu, MD, Stopped at 07/13/24 0005   empagliflozin  (JARDIANCE ) tablet 10 mg, 10 mg, Oral, Daily, Mansy, Jan A, MD, 10 mg at 07/13/24 9053    enoxaparin  (LOVENOX ) injection 30 mg, 30 mg, Subcutaneous, Q24H, Mansy, Jan A, MD, 30 mg at 07/12/24 2032   fluticasone  furoate-vilanterol (BREO ELLIPTA ) 200-25 MCG/ACT 1 puff, 1 puff, Inhalation, Daily, Agbata, Tochukwu, MD, 1 puff at 07/12/24 9177   guaiFENesin  (MUCINEX ) 12 hr tablet 600 mg, 600 mg, Oral, BID, Mansy, Jan A, MD, 600 mg at 07/13/24 9062   insulin  aspart (novoLOG ) injection 0-20 Units, 0-20 Units, Subcutaneous, TID WC, Mansy, Madison LABOR, MD, 3 Units at 07/13/24 9140   insulin  aspart (novoLOG ) injection 0-5 Units, 0-5 Units, Subcutaneous, QHS, Mansy, Jan A, MD   ipratropium-albuterol  (DUONEB) 0.5-2.5 (3) MG/3ML nebulizer solution 3 mL, 3 mL, Nebulization, Q4H PRN, Mansy, Jan A, MD   ipratropium-albuterol  (DUONEB) 0.5-2.5 (3) MG/3ML nebulizer solution 3 mL, 3 mL, Nebulization, TID, Agbata, Tochukwu, MD, 3 mL at 07/13/24 0734   loratadine  (CLARITIN ) tablet 10 mg, 10 mg, Oral, Daily, Mansy, Jan A, MD, 10 mg at 07/13/24 9062   magnesium  hydroxide (MILK OF MAGNESIA) suspension 30 mL, 30 mL, Oral, Daily PRN, Mansy, Jan A, MD   ondansetron  (ZOFRAN ) tablet 4 mg, 4 mg, Oral, Q6H PRN **OR** ondansetron  (ZOFRAN ) injection 4 mg, 4 mg, Intravenous, Q6H PRN, Mansy, Madison LABOR, MD   [COMPLETED] methylPREDNISolone  sodium succinate (SOLU-MEDROL ) 40 mg/mL injection 40 mg, 40 mg, Intravenous, Q12H, 40 mg at 07/11/24 1955 **FOLLOWED BY** predniSONE  (DELTASONE ) tablet 40 mg, 40 mg, Oral, Q breakfast, Mansy, Jan A, MD, 40 mg at 07/13/24 9140   traZODone  (DESYREL ) tablet 25 mg, 25 mg, Oral, QHS PRN, Mansy, Jan A, MD, 25 mg at 07/11/24 2156  Facility-Administered Medications Ordered in Other Encounters:    sodium chloride  flush (NS) 0.9 % injection 10 mL, 10 mL, Intravenous, PRN, Melanee Annah BROCKS, MD, 10 mL at 09/18/20 0850  "

## 2024-07-13 NOTE — Plan of Care (Signed)
" °  Problem: Metabolic: Goal: Ability to maintain appropriate glucose levels will improve Outcome: Progressing   Problem: Skin Integrity: Goal: Risk for impaired skin integrity will decrease Outcome: Progressing   Problem: Education: Goal: Knowledge of General Education information will improve Description: Including pain rating scale, medication(s)/side effects and non-pharmacologic comfort measures Outcome: Progressing   Problem: Coping: Goal: Level of anxiety will decrease Outcome: Progressing   Problem: Safety: Goal: Ability to remain free from injury will improve Outcome: Progressing   Problem: Skin Integrity: Goal: Risk for impaired skin integrity will decrease Outcome: Progressing   Problem: Education: Goal: Knowledge of disease or condition will improve Outcome: Progressing   "

## 2024-07-13 NOTE — Plan of Care (Signed)
" °  Problem: Education: Goal: Ability to describe self-care measures that may prevent or decrease complications (Diabetes Survival Skills Education) will improve Outcome: Progressing Goal: Individualized Educational Video(s) Outcome: Progressing   Problem: Coping: Goal: Ability to adjust to condition or change in health will improve Outcome: Progressing   Problem: Fluid Volume: Goal: Ability to maintain a balanced intake and output will improve Outcome: Progressing   Problem: Health Behavior/Discharge Planning: Goal: Ability to identify and utilize available resources and services will improve Outcome: Progressing Goal: Ability to manage health-related needs will improve Outcome: Progressing   Problem: Metabolic: Goal: Ability to maintain appropriate glucose levels will improve Outcome: Progressing   Problem: Nutritional: Goal: Maintenance of adequate nutrition will improve Outcome: Progressing Goal: Progress toward achieving an optimal weight will improve Outcome: Progressing   Problem: Skin Integrity: Goal: Risk for impaired skin integrity will decrease Outcome: Progressing   Problem: Tissue Perfusion: Goal: Adequacy of tissue perfusion will improve Outcome: Progressing   Problem: Education: Goal: Knowledge of General Education information will improve Description: Including pain rating scale, medication(s)/side effects and non-pharmacologic comfort measures Outcome: Progressing   Problem: Clinical Measurements: Goal: Ability to maintain clinical measurements within normal limits will improve Outcome: Progressing Goal: Will remain free from infection Outcome: Progressing Goal: Diagnostic test results will improve Outcome: Progressing Goal: Respiratory complications will improve Outcome: Progressing Goal: Cardiovascular complication will be avoided Outcome: Progressing   Problem: Activity: Goal: Risk for activity intolerance will decrease Outcome: Progressing    Problem: Nutrition: Goal: Adequate nutrition will be maintained Outcome: Progressing   Problem: Coping: Goal: Level of anxiety will decrease Outcome: Progressing   Problem: Pain Managment: Goal: General experience of comfort will improve and/or be controlled Outcome: Progressing   Problem: Safety: Goal: Ability to remain free from injury will improve Outcome: Progressing   Problem: Skin Integrity: Goal: Risk for impaired skin integrity will decrease Outcome: Progressing   Problem: Education: Goal: Knowledge of disease or condition will improve Outcome: Progressing Goal: Knowledge of the prescribed therapeutic regimen will improve Outcome: Progressing Goal: Individualized Educational Video(s) Outcome: Progressing   "

## 2024-07-13 NOTE — Progress Notes (Signed)
 " Progress Note   Patient: Martin Jennings FMW:[id number] DOB: 1950/09/20 DOA: 07/10/2024     3 DOS: the patient was seen and examined on 07/13/2024   Brief hospital course: Martin Jennings is a 73 y.o. male with medical history significant for COPD, type 2 diabetes mellitus, PVD, IBD and bronchiectasis, as well as lung cancer, essential hypertension, dyslipidemia, and gout, who presented to the emergency room with acute onset of worsening dyspnea over the last week with associated cough with excessive clear mucus production.  He has been having lightheadedness when attempting to walk to the bathroom.  He admitted to chills without reported fever.  The patient admitted to orthopnea and paroxysmal nocturnal dyspnea as well as dyspnea on exertion with lower extremity edema.  No fever or chills.  No nausea or vomiting or abdominal pain.  No dysuria, oliguria or hematuria or flank pain.  He denied any recent sick contacts.  He is at 1 L of O2 by nasal cannula at baseline.  Off of oxygen  he was satting in the 61s with EMS required 4 L of O2 by nasal cannula to bring his sats to the low 90s upon arrival to the ED.  No chest pain or palpitations.   12/19 -has a higher oxygen  requirement and is currently on high flow nasal cannula at 9 L with pulse oximetry of 94%.  Has a productive cough and complains of generalized weakness.  Assessment and Plan:   Acute on chronic respiratory failure with hypoxia Acute COPD exacerbation Community-acquired pneumonia Moderate right pleural effusion ?? Empyema Patient presents to the ER for evaluation of worsening shortness of breath from his baseline Was noted to have room air pulse oximetry in the 70's with tachypnea initially requiring oxygen  supplementation at 4 L to maintain pulse oximetry greater than 92% but now has an increased oxygen  requirement and is currently on high flow nasal cannula at 9 L to retain pulse oximetry greater than 92%. At baseline patient wears 2  L of oxygen  continuous.   Worsening respiratory failure appears to be secondary to acute COPD exacerbation, post treatment fibrosis following treatment for Lung CA  as well as community-acquired pneumonia Continue systemic and inhaled steroids.  Prednisone  decreased to 35mg  daily Continue bronchodilator therapy Continue IV antibiotic therapy with Rocephin  and add doxycycline  Appreciate pulmonary input, IR consult for thoracentesis.  Orders placed for fluid analysis.       Diabetes mellitus with complications of stage IV chronic kidney disease Blood sugars are stable Continue sliding scale insulin  Monitor renal function closely     Chronic diastolic dysfunction CHF Does not appear to be acutely exacerbated at this time Last known LVEF of 60 - 65% from a 2D echocardiogram which was done 12/18 Hold furosemide  for now due to worsening renal function. 2.76 >> 3.06 Continue Jardiance  and amlodipine      History of lung cancer History of stage III adenocarcinoma of the lung status post chemo/radiation as well as maintenance durvalumab  Currently in remission Follow-up with oncology for surveillance    History of coronary artery disease Elevated troponin Troponin is flat and is most likely secondary to demand ischemia from tachypnea and respiratory distress Echocardiogram does not show any regional wall motion abnormalities Continue Plavix  and atorvastatin            Subjective: Appears very weak.  Has a productive cough  Physical Exam: Vitals:   07/13/24 0443 07/13/24 0500 07/13/24 0737 07/13/24 0738  BP:   (!) 156/78   Pulse:  83   Resp:   16   Temp:   97.9 F (36.6 C)   TempSrc:      SpO2:  93% 98% 94%  Weight: 81.1 kg     Height:       GENERAL:  73 y.o.-year-old Caucasian male patient lying in the bed.  Acutely ill-appearing EYES: Pupils equal, round, reactive to light and accommodation. No scleral icterus. Extraocular muscles intact.  HEENT: Head atraumatic,  normocephalic. Oropharynx and nasopharynx clear.  NECK:  Supple, no jugular venous distention. No thyroid  enlargement, no tenderness.  LUNGS: Bilateral air entry, scattered rhonchi and wheezes CARDIOVASCULAR: Regular rate and rhythm, S1, S2 normal. No murmurs, rubs, or gallops.  ABDOMEN: Soft, nondistended, nontender. Bowel sounds present. No organomegaly or mass.  EXTREMITIES: No pedal edema NEUROLOGIC: Cranial nerves II through XII are intact. Muscle strength 5/5 in all extremities. Sensation intact. Gait not checked.  PSYCHIATRIC: The patient is alert and oriented x 3.  Normal affect and good eye contact. SKIN: No obvious rash, lesion, or ulcer.     Data Reviewed:  There are no new results to review at this time.  Family Communication: Discussed patient's current condition and plan of care with his wife over the phone.  All questions and concerns have been addressed.  She verbalizes understanding and agrees with the plan.  Disposition: Status is: Inpatient Remains inpatient appropriate because: On antibiotics for pneumonia  Planned Discharge Destination: TBD    Time spent: 55 minutes  Author: Aimee Somerset, MD 07/13/2024 11:29 AM  For on call review [email or web address].  "

## 2024-07-13 NOTE — Progress Notes (Signed)
 Report called to [employer] on 2A

## 2024-07-13 NOTE — Progress Notes (Signed)
 Called pt's wife to inform her of the transfer to room 256. Questions about bipap answered. Denies further questions.

## 2024-07-13 NOTE — Progress Notes (Signed)
" °  IR BRIEF PROGRESS NOTE:  Patient was brought to US  department for requested thoracentesis attempt.   Initially, patient responded appropriately to all questions, and was consented for procedure, though he kept his eyes closed mostly. Patient's RIGHT back was marked for procedure under US  guidance, and his back then prepped in sterile fashion.   However, before patient was able to be numbed to begin procedure, he was began dropping his O2 sat, down to 78% on 4 L by [place]. Patient was somnolent, and stated he was dizzy. He was difficult to rouse. O2 increased to 7 L by [place], and patient was coached extensively on focus to breathe in through nose, out through mouth. O2 sat improved to a maximum of 87%. Patient remained dizzy, somnolent.   VIR attending, Dr. Jenna, was called to bedside, and ultimately made the call to cancel the attempt. Patient was laid back in bed, and placed by in transport. Care team aware.    Electronically Signed: Carlin DELENA Griffon, PA-C 07/13/2024, 3:17 PM      "

## 2024-07-13 NOTE — Progress Notes (Signed)
 Patient transported from 1c to 2a on BiPAP with no complications,  O2 saturation and vitals signs remained stable thoughout.

## 2024-07-13 NOTE — Progress Notes (Signed)
 Pt was transferred to 2A - Progressive Unit Bed 56. Pt's belongings and medication was also sent.

## 2024-07-13 NOTE — Progress Notes (Signed)
 Pt begin to have a productive cough. Sputum is clear white and thick. SN alerted Dr. Lanetta. Sputum sample will be collected and sent to lab.

## 2024-07-14 DIAGNOSIS — J441 Chronic obstructive pulmonary disease with (acute) exacerbation: Secondary | ICD-10-CM | POA: Diagnosis not present

## 2024-07-14 LAB — GLUCOSE, CAPILLARY
Glucose-Capillary: 115 mg/dL — ABNORMAL HIGH (ref 70–99)
Glucose-Capillary: 180 mg/dL — ABNORMAL HIGH (ref 70–99)
Glucose-Capillary: 102 mg/dL — ABNORMAL HIGH (ref 70–99)
Glucose-Capillary: 267 mg/dL — ABNORMAL HIGH (ref 70–99)
Glucose-Capillary: 195 mg/dL — ABNORMAL HIGH (ref 70–99)

## 2024-07-14 LAB — BLOOD GAS, ARTERIAL
Acid-Base Excess: 3.1 mmol/L — ABNORMAL HIGH (ref 0.0–2.0)
Acid-Base Excess: 0.2 mmol/L (ref 0.0–2.0)
Bicarbonate: 32.1 mmol/L — ABNORMAL HIGH (ref 20.0–28.0)
Bicarbonate: 29.8 mmol/L — ABNORMAL HIGH (ref 20.0–28.0)
Delivery systems: POSITIVE
Expiratory PAP: 8 cmH2O
FIO2: 40 %
Inspiratory PAP: 14 cmH2O
O2 Content: 8 L/min
O2 Saturation: 95.4 %
O2 Saturation: 91.2 %
Patient temperature: 37
Patient temperature: 37
pCO2 arterial: 70 mmHg (ref 32–48)
pCO2 arterial: 68 mmHg (ref 32–48)
pH, Arterial: 7.27 — ABNORMAL LOW (ref 7.35–7.45)
pH, Arterial: 7.25 — ABNORMAL LOW (ref 7.35–7.45)
pO2, Arterial: 71 mmHg — ABNORMAL LOW (ref 83–108)
pO2, Arterial: 61 mmHg — ABNORMAL LOW (ref 83–108)

## 2024-07-14 LAB — BASIC METABOLIC PANEL WITH GFR
Anion gap: 8 (ref 5–15)
BUN: 63 mg/dL — ABNORMAL HIGH (ref 8–23)
CO2: 28 mmol/L (ref 22–32)
Calcium: 8.4 mg/dL — ABNORMAL LOW (ref 8.9–10.3)
Chloride: 101 mmol/L (ref 98–111)
Creatinine, Ser: 3.24 mg/dL — ABNORMAL HIGH (ref 0.61–1.24)
GFR, Estimated: 19 mL/min — ABNORMAL LOW
Glucose, Bld: 172 mg/dL — ABNORMAL HIGH (ref 70–99)
Potassium: 4.9 mmol/L (ref 3.5–5.1)
Sodium: 137 mmol/L (ref 135–145)

## 2024-07-14 LAB — BLOOD GAS, VENOUS
Acid-Base Excess: 2.3 mmol/L — ABNORMAL HIGH (ref 0.0–2.0)
Bicarbonate: 31.2 mmol/L — ABNORMAL HIGH (ref 20.0–28.0)
O2 Saturation: 90 %
Patient temperature: 37
pCO2, Ven: 68 mmHg — ABNORMAL HIGH (ref 44–60)
pH, Ven: 7.27 (ref 7.25–7.43)
pO2, Ven: 58 mmHg — ABNORMAL HIGH (ref 32–45)

## 2024-07-14 LAB — CBC
HCT: 52.7 % — ABNORMAL HIGH (ref 39.0–52.0)
Hemoglobin: 16.4 g/dL (ref 13.0–17.0)
MCH: 31.4 pg (ref 26.0–34.0)
MCHC: 31.1 g/dL (ref 30.0–36.0)
MCV: 101 fL — ABNORMAL HIGH (ref 80.0–100.0)
Platelets: 102 K/uL — ABNORMAL LOW (ref 150–400)
RBC: 5.22 MIL/uL (ref 4.22–5.81)
RDW: 12.8 % (ref 11.5–15.5)
WBC: 7.2 K/uL (ref 4.0–10.5)
nRBC: 0 % (ref 0.0–0.2)

## 2024-07-14 LAB — MAGNESIUM: Magnesium: 2 mg/dL (ref 1.7–2.4)

## 2024-07-14 MED ORDER — PREDNISONE 20 MG PO TABS
30.0000 mg | ORAL_TABLET | Freq: Every day | ORAL | Status: AC
  Administered 2024-07-15: 30 mg via ORAL
  Filled 2024-07-14: qty 1

## 2024-07-14 MED ORDER — POLYETHYLENE GLYCOL 3350 17 G PO PACK
17.0000 g | PACK | Freq: Every day | ORAL | Status: DC
  Administered 2024-07-14 – 2024-07-22 (×9): 17 g via ORAL
  Filled 2024-07-14 (×9): qty 1

## 2024-07-14 MED ORDER — AMOXICILLIN-POT CLAVULANATE 500-125 MG PO TABS
1.0000 | ORAL_TABLET | Freq: Two times a day (BID) | ORAL | Status: AC
  Administered 2024-07-14 – 2024-07-19 (×12): 1 via ORAL
  Filled 2024-07-14 (×12): qty 1

## 2024-07-14 MED ORDER — BISACODYL 10 MG RE SUPP
10.0000 mg | Freq: Once | RECTAL | Status: AC
  Administered 2024-07-14: 10 mg via RECTAL
  Filled 2024-07-14: qty 1

## 2024-07-14 MED ORDER — SENNOSIDES-DOCUSATE SODIUM 8.6-50 MG PO TABS
2.0000 | ORAL_TABLET | Freq: Every day | ORAL | Status: DC
  Administered 2024-07-14 – 2024-07-21 (×7): 2 via ORAL
  Filled 2024-07-14 (×8): qty 2

## 2024-07-14 NOTE — Progress Notes (Signed)
 "    PULMONOLOGY         Date: 07/14/2024,   MRN# [id number] Martin Jennings [birth year]     AdmissionWeight: 82.9 kg                 CurrentWeight: 81 kg  Referring provider: Dr Lanetta   CHIEF COMPLAINT:   Acute on chronic hypoxemic respiratory failure   HISTORY OF PRESENT ILLNESS   Martin Jennings is a 73 y.o. male with medical history significant for COPD, type 2 diabetes mellitus, PVD, IBD and bronchiectasis, as well as lung cancer, essential hypertension, dyslipidemia, and gout, who presented to the emergency room with acute onset of worsening dyspnea over the last week with associated cough with excessive clear mucus production.  He has been having lightheadedness when attempting to walk to the bathroom.  He admitted to chills without reported fever.  The patient admitted to orthopnea and paroxysmal nocturnal dyspnea as well as dyspnea on exertion with lower extremity edema.  No fever or chills.  No nausea or vomiting or abdominal pain.  No dysuria, oliguria or hematuria or flank pain.  He denied any recent sick contacts.  He is at 1 L of O2 by nasal cannula at baseline.  Off of oxygen  he was satting in the 20s with EMS required 4 L of O2 by nasal cannula to bring his sats to the low 90s upon arrival to the ED.  No chest pain or palpitations. He had chest imaging done with findings of mod right pleural effusion on right via CT chest. There is associated compressive atelectasis. There are also fibrotic changes on right lung consistent with post treatment changes. PCCM consultation placed for additional evaluation and management of complex right lung process in patient with lung cancer and copd.   07/13/24- no overnight events. Patient has not had throacentesis done yet. He is diuresing well now appx 3L net negative. Ive sent a message to Dr Jenna with IR via Epic secure chat to notify regarding thoracentesis order. Patient is on 9L/min [place].  Mr Trine is on IV doxy and rocephin  for  CAP tx empirically. Reduced prednisone  to 35mg  PO daily   07/14/24- patient seen at bedside.  He is up awake and alert to person place and time. He was unable to have thoracentesis done due to desaturation. He was noted to have hypercapnic respiratory failure on ABG and received BIPAP ventilation.  He was mildly acidemic on this abg.  Will repeat VBG tonight. He should continue the BIPAP at bedtime for now. I have narrowed his antibiotics to augmetin PO, and reduced prednisone  to 30mg .  He is eating well without episodes of aspiration.   PAST MEDICAL HISTORY   Past Medical History:  Diagnosis Date   Acute respiratory failure with hypoxia (HCC) 07/29/2022   AKI (acute kidney injury) 04/04/2020   Bladder cancer (HCC) 2012   CAP (community acquired pneumonia) 04/04/2020   COPD suggested by initial evaluation 10/11/2022   COVID-19 virus infection 04/03/2020   Goals of care, counseling/discussion 05/19/2020   Hypertension    Influenza A with pneumonia 07/29/2022   Leg pain 01/25/2017   Lung cancer (HCC)    Malignant neoplasm of bladder, unspecified (HCC) 12/17/2010   Peripheral vascular disease    Sepsis (HCC) 07/29/2022     SURGICAL HISTORY   Past Surgical History:  Procedure Laterality Date   BLADDER REMOVAL     LOWER EXTREMITY ANGIOGRAPHY Left 04/12/2017   Procedure: Lower Extremity Angiography;  Surgeon:  Schnier, Cordella MATSU, MD;  Location: [facility] INVASIVE [facility];  Service: Cardiovascular;  Laterality: Left;   PORTA CATH INSERTION N/A 05/22/2020   Procedure: PORTA CATH INSERTION;  Surgeon: Marea Selinda RAMAN, MD;  Location: [facility] INV[facility] CV [facility];  Service: Cardiovascular;  Laterality: N/A;   PORTA CATH REMOVAL N/A 11/15/2022   Procedure: PORTA CATH REMOVAL;  Surgeon: Marea Selinda RAMAN, MD;  Location: [facility] INVAS[facility] CV [facility];  Service: Cardiovascular;  Laterality: N/A;   uretostomy     VASCULAR SURGERY       FAMILY HISTORY   History reviewed. No pertinent family history.   SOCIAL HISTORY    Social History[1]   MEDICATIONS    Home Medication:    Current Medication: Current Medications[2]    ALLERGIES   Patient has no known allergies.     REVIEW OF SYSTEMS    Review of Systems:  Gen:  Denies  fever, sweats, chills weigh loss  HEENT: Denies blurred vision, double vision, ear pain, eye pain, hearing loss, nose bleeds, sore throat Cardiac:  No dizziness, chest pain or heaviness, chest tightness,edema Resp:   reports dyspnea chronically  Gi: Denies swallowing difficulty, stomach pain, nausea or vomiting, diarrhea, constipation, bowel incontinence Gu:  Denies bladder incontinence, burning urine Ext:   Denies Joint pain, stiffness or swelling Skin: Denies  skin rash, easy bruising or bleeding or hives Endoc:  Denies polyuria, polydipsia , polyphagia or weight change Psych:   Denies depression, insomnia or hallucinations   Other:  All other systems negative   VS: BP (!) 149/81   Pulse 82   Temp 97.7 F (36.5 C)   Resp 18   Ht 5' 11 (1.803 m)   Wt 81 kg   SpO2 97%   BMI 24.91 kg/m      PHYSICAL EXAM    GENERAL:NAD, no fevers, chills, no weakness no fatigue HEAD: Normocephalic, atraumatic.  EYES: Pupils equal, round, reactive to light. Extraocular muscles intact. No scleral icterus.  MOUTH: Moist mucosal membrane. Dentition intact. No abscess noted.  EAR, NOSE, THROAT: Clear without exudates. No external lesions.  NECK: Supple. No thyromegaly. No nodules. No JVD.  PULMONARY: decreased breath sounds with mild rhonchi worse at bases bilaterally.  CARDIOVASCULAR: S1 and S2. Regular rate and rhythm. No murmurs, rubs, or gallops. No edema. Pedal pulses 2+ bilaterally.  GASTROINTESTINAL: Soft, nontender, nondistended. No masses. Positive bowel sounds. No hepatosplenomegaly.  MUSCULOSKELETAL: No swelling, clubbing, or edema. Range of motion full in all extremities.  NEUROLOGIC: Cranial nerves II through XII are intact. No gross focal neurological  deficits. Sensation intact. Reflexes intact.  SKIN: No ulceration, lesions, rashes, or cyanosis. Skin warm and dry. Turgor intact.  PSYCHIATRIC: Mood, affect within normal limits. The patient is awake, alert and oriented x 3. Insight, judgment intact.       IMAGING   Narrative & Impression  CLINICAL DATA:  Concern for pneumonia.   EXAM: CT CHEST WITHOUT CONTRAST   TECHNIQUE: Multidetector CT imaging of the chest was performed following the standard protocol without IV contrast.   RADIATION DOSE REDUCTION: This exam was performed according to the departmental dose-optimization program which includes automated exposure control, adjustment of the mA and/or kV according to patient size and/or use of iterative reconstruction technique.   COMPARISON:  Chest radiograph dated 07/10/2024 and CT dated 09/10/2022.   FINDINGS: Evaluation of this exam is limited in the absence of intravenous contrast.   Cardiovascular: There is mild cardiomegaly. No pericardial effusion. Advanced 3 vessel coronary vascular calcification.  Mild atherosclerotic calcification of the thoracic aorta. No aneurysmal dilatation. There is dilatation of the main pulmonary trunk suggestive of pulmonary hypertension.   Mediastinum/Nodes: No obvious hilar adenopathy. Evaluation however is limited due to consolidative changes of the lungs. The esophagus is grossly unremarkable no mediastinal fluid collection.   Lungs/Pleura: Moderate right pleural effusion. There is partial compressive atelectasis of the right lower lobe versus pneumonia. Background of emphysema and diffuse interstitial coarsening. Progression of bandlike consolidation extending from the right hilum into the anterior right upper lobe since the prior CT likely representing post treatment fibrosis. Pneumonia is not excluded. Additional area of consolidation in the lateral right middle lobe also concerning for pneumonia. No pneumothorax. The central  airways are patent.   Upper Abdomen: No acute abnormality.   Musculoskeletal: No acute osseous pathology.   IMPRESSION: 1. Moderate right pleural effusion with partial compressive atelectasis of the right lower lobe versus pneumonia. 2. Progression of bandlike consolidation extending from the right hilum into the anterior right upper lobe since the prior CT likely representing post treatment fibrosis. Pneumonia is not excluded. 3. Additional area of consolidation in the lateral right middle lobe also concerning for pneumonia. 4. Mild cardiomegaly with advanced 3 vessel coronary vascular calcification. 5. Aortic Atherosclerosis (ICD10-I70.0) and Emphysema (ICD10-J43.9).     Electronically Signed   By: Vanetta Chou M.D.   On: 07/11/2024 16:20    ASSESSMENT/PLAN   Acute on chronic hypoxemic respiratory failure   Multifactorial in etiology including possible pneumonia of right lung, Cancer of right lung, moderate pleural effusion and atelectasis of right lower lobe.   - will target each underlying cause for improvement in oxygenation    Moderate right pleural effusion     - thoracentesis with pleural fluid studies    - IR consult with testing placed.    COPD with acute exacerbation      - agree with current COPD carepath     - Doxycycline  IV bid with rocephin  empirically for CAP coverage     - steroids - soluemdrol is now being tapered to prednisone      - PT/OT chest PT    - nebulizer therapy with duoneb and inhaler therapy with Breo ellipta    Compressive atelectasis  Patient weak and unable to use incentive spirometry very well,  I have ordered metaneb therapy with respiratory therapist PT/OT     Physical deconditioning  - [facility]n would benefit from [facility]        Thank you for allowing me to participate in the care of this patient.   Patient/Family are satisfied with care plan and all questions have been answered.    Provider  disclosure: Patient with at least one acute or chronic illness or injury that poses a threat to life or bodily function and is being managed actively during this encounter.  All of the below services have been performed independently by signing provider:  review of prior documentation from internal and or external health records.  Review of previous and current lab results.  Interview and comprehensive assessment during patient visit today. Review of current and previous chest radiographs/CT scans. Discussion of management and test interpretation with health care team and patient/family.   This document was prepared using Dragon voice recognition software and may include unintentional dictation errors.     Airam Heidecker, M.D.  Division of Pulmonary & Critical Care Medicine               [1]  Social  History Tobacco Use   Smoking status: Former    Current packs/day: 0.00    Average packs/day: 0.5 packs/day for 50.0 years (25.0 ttl pk-yrs)    Types: Cigarettes    Quit date: 08/2023    Years since quitting: 0.8   Smokeless tobacco: Never   Tobacco comments:    Per pt. He smokes 1 pack every 3 days  Vaping Use   Vaping status: Never Used  Substance Use Topics   Alcohol use: No   Drug use: Not Currently    Types: Marijuana  [2]  Current Facility-Administered Medications:    acetaminophen  (TYLENOL ) tablet 650 mg, 650 mg, Oral, Q6H PRN **OR** acetaminophen  (TYLENOL ) suppository 650 mg, 650 mg, Rectal, Q6H PRN, Mansy, Jan A, MD   allopurinol  (ZYLOPRIM ) tablet 100 mg, 100 mg, Oral, BID, Mansy, Jan A, MD, 100 mg at 07/14/24 1033   amLODipine  (NORVASC ) tablet 10 mg, 10 mg, Oral, Daily, Mansy, Jan A, MD, 10 mg at 07/14/24 1031   atorvastatin  (LIPITOR) tablet 20 mg, 20 mg, Oral, QPM, Mansy, Jan A, MD, 20 mg at 07/13/24 1813   cefTRIAXone  (ROCEPHIN ) 1 g in sodium chloride  0.9 % 100 mL IVPB, 1 g, Intravenous, Q24H, Mansy, Jan A, MD, Last Rate: 200 mL/hr at 07/13/24 2238, 1 g at 07/13/24  2238   chlorpheniramine-HYDROcodone  (TUSSIONEX) 10-8 MG/5ML suspension 5 mL, 5 mL, Oral, Q12H PRN, Mansy, Jan A, MD, 5 mL at 07/13/24 1120   clopidogrel  (PLAVIX ) tablet 75 mg, 75 mg, Oral, Daily, Mansy, Jan A, MD, 75 mg at 07/14/24 1032   doxycycline  (VIBRAMYCIN ) 100 mg in sodium chloride  0.9 % 250 mL IVPB, 100 mg, Intravenous, BID, Agbata, Tochukwu, MD, Last Rate: 125 mL/hr at 07/14/24 1033, Infusion Verify at 07/14/24 1033   empagliflozin  (JARDIANCE ) tablet 10 mg, 10 mg, Oral, Daily, Mansy, Jan A, MD, 10 mg at 07/14/24 1031   enoxaparin  (LOVENOX ) injection 30 mg, 30 mg, Subcutaneous, Q24H, Mansy, Jan A, MD, 30 mg at 07/14/24 0012   fluticasone  furoate-vilanterol (BREO ELLIPTA ) 200-25 MCG/ACT 1 puff, 1 puff, Inhalation, Daily, Agbata, Tochukwu, MD, 1 puff at 07/14/24 1032   guaiFENesin  (MUCINEX ) 12 hr tablet 600 mg, 600 mg, Oral, BID, Mansy, Jan A, MD, 600 mg at 07/14/24 1031   insulin  aspart (novoLOG ) injection 0-20 Units, 0-20 Units, Subcutaneous, TID WC, Mansy, Madison LABOR, MD, 4 Units at 07/13/24 1753   insulin  aspart (novoLOG ) injection 0-5 Units, 0-5 Units, Subcutaneous, QHS, Mansy, Jan A, MD   ipratropium-albuterol  (DUONEB) 0.5-2.5 (3) MG/3ML nebulizer solution 3 mL, 3 mL, Nebulization, Q4H PRN, Mansy, Jan A, MD   ipratropium-albuterol  (DUONEB) 0.5-2.5 (3) MG/3ML nebulizer solution 3 mL, 3 mL, Nebulization, TID, Agbata, Tochukwu, MD, 3 mL at 07/14/24 0746   loratadine  (CLARITIN ) tablet 10 mg, 10 mg, Oral, Daily, Mansy, Jan A, MD, 10 mg at 07/14/24 1031   magnesium  hydroxide (MILK OF MAGNESIA) suspension 30 mL, 30 mL, Oral, Daily PRN, Mansy, Jan A, MD   ondansetron  (ZOFRAN ) tablet 4 mg, 4 mg, Oral, Q6H PRN **OR** ondansetron  (ZOFRAN ) injection 4 mg, 4 mg, Intravenous, Q6H PRN, Mansy, Madison LABOR, MD   [COMPLETED] methylPREDNISolone  sodium succinate (SOLU-MEDROL ) 40 mg/mL injection 40 mg, 40 mg, Intravenous, Q12H, 40 mg at 07/11/24 1955 **FOLLOWED BY** predniSONE  (DELTASONE ) tablet 35 mg, 35 mg, Oral, Q  breakfast, Nichalos Brenton, MD, 35 mg at 07/14/24 1031   traZODone  (DESYREL ) tablet 25 mg, 25 mg, Oral, QHS PRN, Mansy, Jan A, MD, 25 mg at 07/11/24 2156  Facility-Administered Medications Ordered in Other  Encounters:    sodium chloride  flush (NS) 0.9 % injection 10 mL, 10 mL, Intravenous, PRN, Melanee Annah BROCKS, MD, 10 mL at 09/18/20 0850  "

## 2024-07-14 NOTE — Plan of Care (Signed)

## 2024-07-14 NOTE — Progress Notes (Signed)
 Monitor Progress Note   Patient: Martin Jennings FMW:[id number] DOB: 28-Feb-1951 DOA: 07/10/2024     4 DOS: the patient was seen and examined on 07/14/2024   Brief hospital course: Martin Jennings is a 73 y.o. male with medical history significant for COPD, type 2 diabetes mellitus, PVD, IBD and bronchiectasis, as well as lung cancer, essential hypertension, dyslipidemia, and gout, who presented to the emergency room with acute onset of worsening dyspnea over the last week with associated cough with excessive clear mucus production.  He has been having lightheadedness when attempting to walk to the bathroom.  He admitted to chills without reported fever.  The patient admitted to orthopnea and paroxysmal nocturnal dyspnea as well as dyspnea on exertion with lower extremity edema.  No fever or chills.  No nausea or vomiting or abdominal pain.  No dysuria, oliguria or hematuria or flank pain.  He denied any recent sick contacts.  He is at 1 L of O2 by nasal cannula at baseline.  Off of oxygen  he was satting in the 78s with EMS required 4 L of O2 by nasal cannula to bring his sats to the low 90s upon arrival to the ED.  No chest pain or palpitations.    12/19 -has a higher oxygen  requirement and is currently on high flow nasal cannula at 9 L with pulse oximetry of 94%.  Has a productive cough and complains of generalized weakness.  12/20 -patient was transferred to progressive care overnight due to lethargy, hypercapnia and uncompensated respiratory acidosis requiring noninvasive mechanical ventilation.      Assessment and Plan:  Acute on chronic respiratory failure with hypoxia and hypercapnia Acute COPD exacerbation Community-acquired pneumonia Moderate right pleural effusion ?? Empyema Patient presents to the ER for evaluation of worsening shortness of breath from his baseline Was noted to have room air pulse oximetry in the 70's with tachypnea initially requiring oxygen  supplementation at 4 L to  maintain pulse oximetry greater than 92%.  Patient developed an increased oxygen  requirement and was placed on high flow nasal cannula at 9 L.  He developed lethargy and increased somnolence and an arterial blood gas showed uncompensated respiratory acidosis. Continue BiPAP At baseline patient wears 2 L of oxygen  continuous.   Worsening respiratory failure appears to be secondary to acute COPD exacerbation, post treatment fibrosis following treatment for Lung CA  as well as community-acquired pneumonia Continue systemic and inhaled steroids.  Prednisone  decreased to 30mg  daily Continue bronchodilator therapy Continue IV antibiotic therapy with Rocephin  and add doxycycline  Appreciate pulmonary input, IR consulted for thoracentesis procedure canceled because patient was unstable.  Orders placed for fluid analysis.         Diabetes mellitus with complications of stage IV chronic kidney disease Blood sugars are stable Continue sliding scale insulin  Monitor renal function closely      AKI superimposed on stage IV chronic kidney disease  Baseline serum creatinine is 2.76, serum creatinine shows an upward trend and is at 3.24 Hold diuretic therapy Avoid nephrotoxic agents Monitor renal function closely   Chronic diastolic dysfunction CHF Does not appear to be acutely exacerbated at this time Last known LVEF of 60 - 65% from a 2D echocardiogram which was done 12/18 Hold furosemide  for now due to worsening renal function. 2.76 >> 3.06 Continue Jardiance  and amlodipine      History of lung cancer History of stage III adenocarcinoma of the lung status post chemo/radiation as well as maintenance durvalumab  Currently in remission Follow-up with oncology for  surveillance     History of coronary artery disease Elevated troponin Troponin is flat and is most likely secondary to demand ischemia from tachypnea and respiratory distress Echocardiogram does not show any regional wall motion  abnormalities Continue Plavix  and atorvastatin             Subjective: Patient is seen and examined at the bedside.  He is awake.  Less short of breath.  Physical Exam: Vitals:   07/14/24 0500 07/14/24 0827 07/14/24 0915 07/14/24 1110  BP:  (!) 149/81  (!) 148/73  Pulse:  82  86  Resp:  18  20  Temp:  97.7 F (36.5 C)  98.4 F (36.9 C)  TempSrc:      SpO2:  97% 91% 90%  Weight: 81 kg     Height:       GENERAL:  73 y.o.-year-old Caucasian male patient lying in the bed.  Acutely ill-appearing EYES: Pupils equal, round, reactive to light and accommodation. No scleral icterus. Extraocular muscles intact.  HEENT: Head atraumatic, normocephalic. Oropharynx and nasopharynx clear.  NECK:  Supple, no jugular venous distention. No thyroid  enlargement, no tenderness.  LUNGS: Bilateral air entry, scattered rhonchi and wheezes CARDIOVASCULAR: Regular rate and rhythm, S1, S2 normal. No murmurs, rubs, or gallops.  ABDOMEN: Soft, nondistended, nontender. Bowel sounds present. No organomegaly or mass.  EXTREMITIES: No pedal edema NEUROLOGIC: Cranial nerves II through XII are intact. Muscle strength 5/5 in all extremities. Sensation intact. Gait not checked.  PSYCHIATRIC: The patient is alert and oriented x 3.  Normal affect and good eye contact. SKIN: No obvious rash, lesion, or ulcer.   Data Reviewed: Labs reviewed.  BUN 63, creatinine 3.24 Labs reviewed.    Family Communication: Plan of care was discussed with patient's wife over the phone. All questions and concerns have been addressed.  She is understanding and agrees with the plan  Disposition: Status is: Inpatient Remains inpatient appropriate because: Requiring BiPAP  Planned Discharge Destination: TBD    Time spent: 55 minutes  Author: Aimee Somerset, MD 07/14/2024 1:10 PM  For on call review [email or web address].

## 2024-07-15 ENCOUNTER — Inpatient Hospital Stay

## 2024-07-15 DIAGNOSIS — J441 Chronic obstructive pulmonary disease with (acute) exacerbation: Secondary | ICD-10-CM | POA: Diagnosis not present

## 2024-07-15 LAB — CULTURE, BLOOD (ROUTINE X 2)
Culture: NO GROWTH
Culture: NO GROWTH

## 2024-07-15 LAB — BASIC METABOLIC PANEL WITH GFR
Anion gap: 7 (ref 5–15)
BUN: 69 mg/dL — ABNORMAL HIGH (ref 8–23)
CO2: 27 mmol/L (ref 22–32)
Calcium: 8.5 mg/dL — ABNORMAL LOW (ref 8.9–10.3)
Chloride: 104 mmol/L (ref 98–111)
Creatinine, Ser: 3.03 mg/dL — ABNORMAL HIGH (ref 0.61–1.24)
GFR, Estimated: 21 mL/min — ABNORMAL LOW
Glucose, Bld: 143 mg/dL — ABNORMAL HIGH (ref 70–99)
Potassium: 5.1 mmol/L (ref 3.5–5.1)
Sodium: 139 mmol/L (ref 135–145)

## 2024-07-15 LAB — GLUCOSE, CAPILLARY
Glucose-Capillary: 125 mg/dL — ABNORMAL HIGH (ref 70–99)
Glucose-Capillary: 163 mg/dL — ABNORMAL HIGH (ref 70–99)
Glucose-Capillary: 204 mg/dL — ABNORMAL HIGH (ref 70–99)

## 2024-07-15 LAB — BLOOD GAS, VENOUS

## 2024-07-15 MED ORDER — ACETYLCYSTEINE 20 % IN SOLN
4.0000 mL | Freq: Three times a day (TID) | RESPIRATORY_TRACT | Status: DC
  Administered 2024-07-15 – 2024-07-20 (×13): 4 mL via RESPIRATORY_TRACT
  Filled 2024-07-15 (×18): qty 4

## 2024-07-15 MED ORDER — ACETYLCYSTEINE 20 % IN SOLN: 70.0000 mg/kg | Freq: Three times a day (TID) | RESPIRATORY_TRACT | Status: DC

## 2024-07-15 NOTE — Progress Notes (Signed)
 "    PULMONOLOGY         Date: 07/15/2024,   MRN# [id number] Martin Jennings 1951-01-04     AdmissionWeight: 82.9 kg                 CurrentWeight: 81 kg  Referring provider: Dr Lanetta   CHIEF COMPLAINT:   Acute on chronic hypoxemic respiratory failure   HISTORY OF PRESENT ILLNESS   Martin Jennings is a 73 y.o. male with medical history significant for COPD, type 2 diabetes mellitus, PVD, IBD and bronchiectasis, as well as lung cancer, essential hypertension, dyslipidemia, and gout, who presented to the emergency room with acute onset of worsening dyspnea over the last week with associated cough with excessive clear mucus production.  He has been having lightheadedness when attempting to walk to the bathroom.  He admitted to chills without reported fever.  The patient admitted to orthopnea and paroxysmal nocturnal dyspnea as well as dyspnea on exertion with lower extremity edema.  No fever or chills.  No nausea or vomiting or abdominal pain.  No dysuria, oliguria or hematuria or flank pain.  He denied any recent sick contacts.  He is at 1 L of O2 by nasal cannula at baseline.  Off of oxygen  he was satting in the 2s with EMS required 4 L of O2 by nasal cannula to bring his sats to the low 90s upon arrival to the ED.  No chest pain or palpitations. He had chest imaging done with findings of mod right pleural effusion on right via CT chest. There is associated compressive atelectasis. There are also fibrotic changes on right lung consistent with post treatment changes. PCCM consultation placed for additional evaluation and management of complex right lung process in patient with lung cancer and copd.   07/13/24- no overnight events. Patient has not had throacentesis done yet. He is diuresing well now appx 3L net negative. Ive sent a message to Dr Jenna with IR via Epic secure chat to notify regarding thoracentesis order. Patient is on 9L/min .  Mr Ace is on IV doxy and rocephin  for  CAP tx empirically. Reduced prednisone  to 35mg  PO daily   07/14/24- patient seen at bedside.  He is up awake and alert to person place and time. He was unable to have thoracentesis done due to desaturation. He was noted to have hypercapnic respiratory failure on ABG and received BIPAP ventilation.  He was mildly acidemic on this abg.  Will repeat VBG tonight. He should continue the BIPAP at bedtime for now. I have narrowed his antibiotics to augmetin PO, and reduced prednisone  to 30mg .  He is eating well without episodes of aspiration.   07/15/24- patient on 7L/min.  He is coughing with expectoration of clearish phlegm.  He did not get thoracentesis still.  Im repeating VBG and CXR today to evaluate interval changes on pleural effusion. He is net negative 2L but has renal failure and it would benefit him getting the fluid drained.  Im ordering mucomyst  today TID with resp therapist to help him expetorate some of the inspissated mucus.   PAST MEDICAL HISTORY   Past Medical History:  Diagnosis Date   Acute respiratory failure with hypoxia (HCC) 07/29/2022   AKI (acute kidney injury) 04/04/2020   Bladder cancer (HCC) 2012   CAP (community acquired pneumonia) 04/04/2020   COPD suggested by initial evaluation 10/11/2022   COVID-19 virus infection 04/03/2020   Goals of care, counseling/discussion 05/19/2020   Hypertension  Influenza A with pneumonia 07/29/2022   Leg pain 01/25/2017   Lung cancer (HCC)    Malignant neoplasm of bladder, unspecified (HCC) 12/17/2010   Peripheral vascular disease    Sepsis (HCC) 07/29/2022     SURGICAL HISTORY   Past Surgical History:  Procedure Laterality Date   BLADDER REMOVAL     LOWER EXTREMITY ANGIOGRAPHY Left 04/12/2017   Procedure: Lower Extremity Angiography;  Surgeon: Jama Cordella MATSU, MD;  Location: [facility];  Service: Cardiovascular;  Laterality: Left;   PORTA CATH INSERTION N/A 05/22/2020   Procedure: PORTA CATH INSERTION;   Surgeon: Marea Selinda RAMAN, MD;  Location: [facility];  Service: Cardiovascular;  Laterality: N/A;   PORTA CATH REMOVAL N/A 11/15/2022   Procedure: PORTA CATH REMOVAL;  Surgeon: Marea Selinda RAMAN, MD;  Location: [facility];  Service: Cardiovascular;  Laterality: N/A;   uretostomy     VASCULAR SURGERY       FAMILY HISTORY   History reviewed. No pertinent family history.   SOCIAL HISTORY   Social History[1]   MEDICATIONS    Home Medication:    Current Medication: Current Medications[2]    ALLERGIES   Patient has no known allergies.     REVIEW OF SYSTEMS    Review of Systems:  Gen:  Denies  fever, sweats, chills weigh loss  HEENT: Denies blurred vision, double vision, ear pain, eye pain, hearing loss, nose bleeds, sore throat Cardiac:  No dizziness, chest pain or heaviness, chest tightness,edema Resp:   reports dyspnea chronically  Gi: Denies swallowing difficulty, stomach pain, nausea or vomiting, diarrhea, constipation, bowel incontinence Gu:  Denies bladder incontinence, burning urine Ext:   Denies Joint pain, stiffness or swelling Skin: Denies  skin rash, easy bruising or bleeding or hives Endoc:  Denies polyuria, polydipsia , polyphagia or weight change Psych:   Denies depression, insomnia or hallucinations   Other:  All other systems negative   VS: BP (!) 156/81   Pulse 82   Temp 98.3 F (36.8 C)   Resp 18   Ht 5' 11 (1.803 m)   Wt 81 kg   SpO2 91%   BMI 24.91 kg/m      PHYSICAL EXAM    GENERAL:NAD, no fevers, chills, no weakness no fatigue HEAD: Normocephalic, atraumatic.  EYES: Pupils equal, round, reactive to light. Extraocular muscles intact. No scleral icterus.  MOUTH: Moist mucosal membrane. Dentition intact. No abscess noted.  EAR, NOSE, THROAT: Clear without exudates. No external lesions.  NECK: Supple. No thyromegaly. No nodules. No JVD.  PULMONARY: decreased breath sounds with mild rhonchi worse at bases bilaterally.   CARDIOVASCULAR: S1 and S2. Regular rate and rhythm. No murmurs, rubs, or gallops. No edema. Pedal pulses 2+ bilaterally.  GASTROINTESTINAL: Soft, nontender, nondistended. No masses. Positive bowel sounds. No hepatosplenomegaly.  MUSCULOSKELETAL: No swelling, clubbing, or edema. Range of motion full in all extremities.  NEUROLOGIC: Cranial nerves II through XII are intact. No gross focal neurological deficits. Sensation intact. Reflexes intact.  SKIN: No ulceration, lesions, rashes, or cyanosis. Skin warm and dry. Turgor intact.  PSYCHIATRIC: Mood, affect within normal limits. The patient is awake, alert and oriented x 3. Insight, judgment intact.       IMAGING   Narrative & Impression  CLINICAL DATA:  Concern for pneumonia.   EXAM: CT CHEST WITHOUT CONTRAST   TECHNIQUE: Multidetector CT imaging of the chest was performed following the standard protocol without IV contrast.   RADIATION DOSE REDUCTION: This exam  was performed according to the departmental dose-optimization program which includes automated exposure control, adjustment of the mA and/or kV according to patient size and/or use of iterative reconstruction technique.   COMPARISON:  Chest radiograph dated 07/10/2024 and CT dated 09/10/2022.   FINDINGS: Evaluation of this exam is limited in the absence of intravenous contrast.   Cardiovascular: There is mild cardiomegaly. No pericardial effusion. Advanced 3 vessel coronary vascular calcification. Mild atherosclerotic calcification of the thoracic aorta. No aneurysmal dilatation. There is dilatation of the main pulmonary trunk suggestive of pulmonary hypertension.   Mediastinum/Nodes: No obvious hilar adenopathy. Evaluation however is limited due to consolidative changes of the lungs. The esophagus is grossly unremarkable no mediastinal fluid collection.   Lungs/Pleura: Moderate right pleural effusion. There is partial compressive atelectasis of the right lower  lobe versus pneumonia. Background of emphysema and diffuse interstitial coarsening. Progression of bandlike consolidation extending from the right hilum into the anterior right upper lobe since the prior CT likely representing post treatment fibrosis. Pneumonia is not excluded. Additional area of consolidation in the lateral right middle lobe also concerning for pneumonia. No pneumothorax. The central airways are patent.   Upper Abdomen: No acute abnormality.   Musculoskeletal: No acute osseous pathology.   IMPRESSION: 1. Moderate right pleural effusion with partial compressive atelectasis of the right lower lobe versus pneumonia. 2. Progression of bandlike consolidation extending from the right hilum into the anterior right upper lobe since the prior CT likely representing post treatment fibrosis. Pneumonia is not excluded. 3. Additional area of consolidation in the lateral right middle lobe also concerning for pneumonia. 4. Mild cardiomegaly with advanced 3 vessel coronary vascular calcification. 5. Aortic Atherosclerosis (ICD10-I70.0) and Emphysema (ICD10-J43.9).     Electronically Signed   By: Vanetta Chou M.D.   On: 07/11/2024 16:20    ASSESSMENT/PLAN   Acute on chronic hypoxemic respiratory failure   Multifactorial in etiology including possible pneumonia of right lung, Cancer of right lung, moderate pleural effusion and atelectasis of right lower lobe.   - will target each underlying cause for improvement in oxygenation    Moderate right pleural effusion     - thoracentesis with pleural fluid studies    - IR consult with testing placed.    COPD with acute exacerbation      - agree with current COPD carepath     - Doxycycline  IV bid with rocephin  empirically for CAP coverage     - steroids - soluemdrol is now being tapered to prednisone      - PT/OT chest PT    - nebulizer therapy with duoneb and inhaler therapy with Breo ellipta    Compressive atelectasis   Patient weak and unable to use incentive spirometry very well,  I have ordered metaneb therapy with respiratory therapist PT/OT     Physical deconditioning  - [facility]ietn would benefit from [facility]        Thank you for allowing me to participate in the care of this patient.   Patient/Family are satisfied with care plan and all questions have been answered.    Provider disclosure: Patient with at least one acute or chronic illness or injury that poses a threat to life or bodily function and is being managed actively during this encounter.  All of the below services have been performed independently by signing provider:  review of prior documentation from internal and or external health records.  Review of previous and current lab results.  Interview and comprehensive assessment during  patient visit today. Review of current and previous chest radiographs/CT scans. Discussion of management and test interpretation with health care team and patient/family.   This document was prepared using Dragon voice recognition software and may include unintentional dictation errors.     Gaye Scorza, M.D.  Division of Pulmonary & Critical Care Medicine                [1]  Social History Tobacco Use   Smoking status: Former    Current packs/day: 0.00    Average packs/day: 0.5 packs/day for 50.0 years (25.0 ttl pk-yrs)    Types: Cigarettes    Quit date: 08/2023    Years since quitting: 0.8   Smokeless tobacco: Never   Tobacco comments:    Per pt. He smokes 1 pack every 3 days  Vaping Use   Vaping status: Never Used  Substance Use Topics   Alcohol use: No   Drug use: Not Currently    Types: Marijuana  [2]  Current Facility-Administered Medications:    acetaminophen  (TYLENOL ) tablet 650 mg, 650 mg, Oral, Q6H PRN **OR** acetaminophen  (TYLENOL ) suppository 650 mg, 650 mg, Rectal, Q6H PRN, Mansy, Jan A, MD   acetylcysteine  (MUCOMYST ) 20 % nebulizer /  oral solution 5,680 mg, 70 mg/kg, Nebulization, TID, Idalis Hoelting, MD   allopurinol  (ZYLOPRIM ) tablet 100 mg, 100 mg, Oral, BID, Mansy, Jan A, MD, 100 mg at 07/15/24 0831   amLODipine  (NORVASC ) tablet 10 mg, 10 mg, Oral, Daily, Mansy, Jan A, MD, 10 mg at 07/15/24 9167   amoxicillin -clavulanate (AUGMENTIN ) 500-125 MG per tablet 1 tablet, 1 tablet, Oral, Q12H, Cambelle Suchecki, MD, 1 tablet at 07/15/24 0831   atorvastatin  (LIPITOR) tablet 20 mg, 20 mg, Oral, QPM, Mansy, Jan A, MD, 20 mg at 07/14/24 1734   chlorpheniramine-HYDROcodone  (TUSSIONEX) 10-8 MG/5ML suspension 5 mL, 5 mL, Oral, Q12H PRN, Mansy, Jan A, MD, 5 mL at 07/13/24 1120   clopidogrel  (PLAVIX ) tablet 75 mg, 75 mg, Oral, Daily, Mansy, Jan A, MD, 75 mg at 07/15/24 9167   empagliflozin  (JARDIANCE ) tablet 10 mg, 10 mg, Oral, Daily, Mansy, Jan A, MD, 10 mg at 07/15/24 9168   enoxaparin  (LOVENOX ) injection 30 mg, 30 mg, Subcutaneous, Q24H, Mansy, Jan A, MD, 30 mg at 07/14/24 2233   fluticasone  furoate-vilanterol (BREO ELLIPTA ) 200-25 MCG/ACT 1 puff, 1 puff, Inhalation, Daily, Agbata, Tochukwu, MD, 1 puff at 07/15/24 9166   guaiFENesin  (MUCINEX ) 12 hr tablet 600 mg, 600 mg, Oral, BID, Mansy, Jan A, MD, 600 mg at 07/15/24 9164   insulin  aspart (novoLOG ) injection 0-20 Units, 0-20 Units, Subcutaneous, TID WC, Mansy, Madison LABOR, MD, 3 Units at 07/15/24 9166   insulin  aspart (novoLOG ) injection 0-5 Units, 0-5 Units, Subcutaneous, QHS, Mansy, Jan A, MD   ipratropium-albuterol  (DUONEB) 0.5-2.5 (3) MG/3ML nebulizer solution 3 mL, 3 mL, Nebulization, Q4H PRN, Mansy, Jan A, MD   ipratropium-albuterol  (DUONEB) 0.5-2.5 (3) MG/3ML nebulizer solution 3 mL, 3 mL, Nebulization, TID, Agbata, Tochukwu, MD, 3 mL at 07/15/24 0730   loratadine  (CLARITIN ) tablet 10 mg, 10 mg, Oral, Daily, Mansy, Jan A, MD, 10 mg at 07/15/24 9168   magnesium  hydroxide (MILK OF MAGNESIA) suspension 30 mL, 30 mL, Oral, Daily PRN, Mansy, Jan A, MD   ondansetron  (ZOFRAN ) tablet 4 mg, 4 mg,  Oral, Q6H PRN **OR** ondansetron  (ZOFRAN ) injection 4 mg, 4 mg, Intravenous, Q6H PRN, Mansy, Jan A, MD   polyethylene glycol (MIRALAX  / GLYCOLAX ) packet 17 g, 17 g, Oral, Daily, Agbata, Tochukwu, MD, 17 g at 07/15/24  0831   senna-docusate (Senokot-S) tablet 2 tablet, 2 tablet, Oral, QHS, Agbata, Tochukwu, MD, 2 tablet at 07/14/24 2233   traZODone  (DESYREL ) tablet 25 mg, 25 mg, Oral, QHS PRN, Mansy, Jan A, MD, 25 mg at 07/11/24 2156  Facility-Administered Medications Ordered in Other Encounters:    sodium chloride  flush (NS) 0.9 % injection 10 mL, 10 mL, Intravenous, PRN, Melanee Annah BROCKS, MD, 10 mL at 09/18/20 0850  "

## 2024-07-15 NOTE — Plan of Care (Signed)

## 2024-07-15 NOTE — Progress Notes (Signed)
 " Progress Note   Patient: Martin Jennings FMW:[id number] DOB: 1951-05-10 DOA: 07/10/2024     5 DOS: the patient was seen and examined on 07/15/2024   Brief hospital course:  JAMERIUS BOECKMAN is a 73 y.o. male with medical history significant for COPD, type 2 diabetes mellitus, PVD, IBD and bronchiectasis, as well as lung cancer, essential hypertension, dyslipidemia, and gout, who presented to the emergency room with acute onset of worsening dyspnea over the last week with associated cough with excessive clear mucus production.  He has been having lightheadedness when attempting to walk to the bathroom.  He admitted to chills without reported fever.  The patient admitted to orthopnea and paroxysmal nocturnal dyspnea as well as dyspnea on exertion with lower extremity edema.  No fever or chills.  No nausea or vomiting or abdominal pain.  No dysuria, oliguria or hematuria or flank pain.  He denied any recent sick contacts.  He is at 1 L of O2 by nasal cannula at baseline.  Off of oxygen  he was satting in the 3s with EMS required 4 L of O2 by nasal cannula to bring his sats to the low 90s upon arrival to the ED.  No chest pain or palpitations.    12/19 -has a higher oxygen  requirement and is currently on high flow nasal cannula at 9 L with pulse oximetry of 94%.  Has a productive cough and complains of generalized weakness.   12/20 -patient was transferred to progressive care overnight due to lethargy, hypercapnia and uncompensated respiratory acidosis requiring noninvasive mechanical ventilation.  12/21 -patient is seen and examined at the bedside.  He is awake and alert.  Had a bowel movement overnight.       Assessment and Plan:  Acute on chronic respiratory failure with hypoxia and hypercapnia Acute COPD exacerbation Community-acquired pneumonia Moderate right pleural effusion ?? Empyema Patient presents to the ER for evaluation of worsening shortness of breath from his baseline.At baseline  patient wears 2 L of oxygen  continuous.   Was noted to have room air pulse oximetry in the 70's with tachypnea initially requiring oxygen  supplementation at 4 L to maintain pulse oximetry greater than 92%.  Patient developed an increased oxygen  requirement and was placed on high flow nasal cannula at 9 L.  He developed lethargy and increased somnolence and an arterial blood gas showed uncompensated respiratory acidosis. Appreciate pulmonary input 12/21 Continue BiPAP as needed during the day and at night.  He is currently on high flow nasal cannula at 7 L  To maintain pulse oximetry of 92%.  Appreciate pulmonary input, recommends Mucomyst  to improve expectoration. Worsening respiratory failure appears to be secondary to acute COPD exacerbation, post treatment fibrosis following treatment for Lung CA  as well as community-acquired pneumonia Continue systemic and inhaled steroids.  Prednisone  decreased to 30mg  daily Continue bronchodilator therapy Continue IV antibiotic therapy with Rocephin  and doxycycline  Appreciate pulmonary input, IR consulted for thoracentesis procedure canceled because patient was unstable.  Orders placed for fluid analysis.  For thoracentesis in a.m.         Diabetes mellitus with complications of stage IV chronic kidney disease Blood sugars are stable Continue sliding scale insulin  Monitor renal function closely       AKI superimposed on stage IV chronic kidney disease  Baseline serum creatinine is 2.76, serum creatinine shows an upward trend and is at 3.24 Hold diuretic therapy Avoid nephrotoxic agents Monitor renal function closely     Chronic diastolic dysfunction CHF Does  not appear to be acutely exacerbated at this time Last known LVEF of 60 - 65% from a 2D echocardiogram which was done 12/18 Hold furosemide  for now due to worsening renal function. 2.76 >> 3.06 >> 3.03 Hold Jardiance   Continue amlodipine      History of lung cancer History of stage III  adenocarcinoma of the lung status post chemo/radiation as well as maintenance durvalumab  Currently in remission Follow-up with oncology for surveillance     History of coronary artery disease Elevated troponin Troponin is flat and is most likely secondary to demand ischemia from tachypnea and respiratory distress Echocardiogram does not show any regional wall motion abnormalities Continue Plavix  and atorvastatin               Subjective: Feels better  Physical Exam: Vitals:   07/15/24 0730 07/15/24 0802 07/15/24 1203 07/15/24 1401  BP:  (!) 156/81 (!) 154/69   Pulse:  82 81   Resp:  18 18   Temp:  98.3 F (36.8 C) 98.6 F (37 C)   TempSrc:   Oral   SpO2: 90% 91% 90% 90%  Weight:      Height:       GENERAL:  73 y.o.-year-old Caucasian male patient lying in the bed.  Acutely ill-appearing EYES: Pupils equal, round, reactive to light and accommodation. No scleral icterus. Extraocular muscles intact.  HEENT: Head atraumatic, normocephalic. Oropharynx and nasopharynx clear.  NECK:  Supple, no jugular venous distention. No thyroid  enlargement, no tenderness.  LUNGS: Bilateral air entry, scattered rhonchi and wheezes CARDIOVASCULAR: Regular rate and rhythm, S1, S2 normal. No murmurs, rubs, or gallops.  ABDOMEN: Soft, nondistended, nontender. Bowel sounds present. No organomegaly or mass.  EXTREMITIES: No pedal edema NEUROLOGIC: Cranial nerves II through XII are intact. Muscle strength 5/5 in all extremities. Sensation intact. Gait not checked.  PSYCHIATRIC: The patient is alert and oriented x 3.  Normal affect and good eye contact. SKIN: No obvious rash, lesion, or ulcer.     Data Reviewed: BUN 69, creatinine 3.03 Labs reviewed  Family Communication: Plan of care discussed with patient at the bedside.  He verbalizes understanding and agrees with the plan  Disposition: Status is: Inpatient Remains inpatient appropriate because: For thoracentesis in a.m.  Planned  Discharge Destination: Skilled nursing facility    Time spent: 50 minutes  Author: Aimee Somerset, MD 07/15/2024 2:23 PM  For on call review [email or web address].  "

## 2024-07-16 ENCOUNTER — Inpatient Hospital Stay

## 2024-07-16 ENCOUNTER — Other Ambulatory Visit: Payer: Self-pay | Admitting: Internal Medicine

## 2024-07-16 DIAGNOSIS — J441 Chronic obstructive pulmonary disease with (acute) exacerbation: Secondary | ICD-10-CM | POA: Diagnosis not present

## 2024-07-16 LAB — BLOOD GAS, ARTERIAL
Acid-Base Excess: 4.3 mmol/L — ABNORMAL HIGH (ref 0.0–2.0)
Bicarbonate: 33 mmol/L — ABNORMAL HIGH (ref 20.0–28.0)
O2 Content: 6 L/min
O2 Saturation: 96.2 %
Patient temperature: 37
pCO2 arterial: 64 mmHg — ABNORMAL HIGH (ref 32–48)
pH, Arterial: 7.32 — ABNORMAL LOW (ref 7.35–7.45)
pO2, Arterial: 75 mmHg — ABNORMAL LOW (ref 83–108)

## 2024-07-16 LAB — CULTURE, RESPIRATORY W GRAM STAIN
Culture: NORMAL
Gram Stain: NONE SEEN

## 2024-07-16 LAB — GLUCOSE, CAPILLARY
Glucose-Capillary: 115 mg/dL — ABNORMAL HIGH (ref 70–99)
Glucose-Capillary: 116 mg/dL — ABNORMAL HIGH (ref 70–99)
Glucose-Capillary: 167 mg/dL — ABNORMAL HIGH (ref 70–99)
Glucose-Capillary: 194 mg/dL — ABNORMAL HIGH (ref 70–99)
Glucose-Capillary: 85 mg/dL (ref 70–99)
Glucose-Capillary: 96 mg/dL (ref 70–99)

## 2024-07-16 LAB — BODY FLUID CELL COUNT WITH DIFFERENTIAL
Eos, Fluid: 0 %
Lymphs, Fluid: 49 %
Monocyte-Macrophage-Serous Fluid: 26 %
Neutrophil Count, Fluid: 25 %
Total Nucleated Cell Count, Fluid: 648 uL

## 2024-07-16 LAB — BLOOD GAS, VENOUS
Acid-base deficit: 0.9 mmol/L (ref 0.0–2.0)
Bicarbonate: 28.6 mmol/L — ABNORMAL HIGH (ref 20.0–28.0)
Patient temperature: 37
Patient temperature: 37 %
pCO2, Ven: 70 mmHg — ABNORMAL HIGH (ref 44–60)
pH, Ven: 7.22 — ABNORMAL LOW (ref 7.25–7.43)
pO2, Ven: 57 mmHg — ABNORMAL HIGH (ref 32–45)

## 2024-07-16 MED ADMIN — Lidocaine HCl Local Preservative Free (PF) Inj 1%: 10 mL | INTRADERMAL | NDC 63323049297

## 2024-07-16 NOTE — Procedures (Signed)
 PROCEDURE SUMMARY:  Successful image-guided right-sided diagnostic and therapeutic thoracentesis. Yielded 0.90 liters of clear, straw-colored pleural fluid. Patient tolerated procedure well. EBL: Zero No immediate complications.  Specimen was sent for labs. Post procedure CXR is currently pending.  Please see imaging section of Epic for full dictation.  Carlin LABOR Mavi Un PA-C 07/16/2024 1:31 PM

## 2024-07-16 NOTE — Progress Notes (Signed)
 "    PULMONOLOGY         Date: 07/16/2024,   MRN# [id number] Martin Jennings 1950-12-20     AdmissionWeight: 82.9 kg                 CurrentWeight: 78 kg  Referring provider: Dr Lanetta   CHIEF COMPLAINT:   Acute on chronic hypoxemic respiratory failure   HISTORY OF PRESENT ILLNESS   Martin Jennings is a 73 y.o. male with medical history significant for COPD, type 2 diabetes mellitus, PVD, IBD and bronchiectasis, as well as lung cancer, essential hypertension, dyslipidemia, and gout, who presented to the emergency room with acute onset of worsening dyspnea over the last week with associated cough with excessive clear mucus production.  He has been having lightheadedness when attempting to walk to the bathroom.  He admitted to chills without reported fever.  The patient admitted to orthopnea and paroxysmal nocturnal dyspnea as well as dyspnea on exertion with lower extremity edema.  No fever or chills.  No nausea or vomiting or abdominal pain.  No dysuria, oliguria or hematuria or flank pain.  He denied any recent sick contacts.  He is at 1 L of O2 by nasal cannula at baseline.  Off of oxygen  he was satting in the 52s with EMS required 4 L of O2 by nasal cannula to bring his sats to the low 90s upon arrival to the ED.  No chest pain or palpitations. He had chest imaging done with findings of mod right pleural effusion on right via CT chest. There is associated compressive atelectasis. There are also fibrotic changes on right lung consistent with post treatment changes. PCCM consultation placed for additional evaluation and management of complex right lung process in patient with lung cancer and copd.   07/13/24- no overnight events. Patient has not had throacentesis done yet. He is diuresing well now appx 3L net negative. Ive sent a message to Dr Jenna with IR via Epic secure chat to notify regarding thoracentesis order. Patient is on 9L/min [place].  Martin Jennings is on IV doxy and rocephin  for  CAP tx empirically. Reduced prednisone  to 35mg  PO daily   07/14/24- patient seen at bedside.  He is up awake and alert to person place and time. He was unable to have thoracentesis done due to desaturation. He was noted to have hypercapnic respiratory failure on ABG and received BIPAP ventilation.  He was mildly acidemic on this abg.  Will repeat VBG tonight. He should continue the BIPAP at bedtime for now. I have narrowed his antibiotics to augmetin PO, and reduced prednisone  to 30mg .  He is eating well without episodes of aspiration.   07/15/24- patient on 7L/min.  He is coughing with expectoration of clearish phlegm.  He did not get thoracentesis still.  Im repeating VBG and CXR today to evaluate interval changes on pleural effusion. He is net negative 2L but has renal failure and it would benefit him getting the fluid drained.  Im ordering mucomyst  today TID with resp therapist to help him expetorate some of the inspissated mucus.   07/16/24- patient s/p thoracentesis. Post removal of 900 cc straw colored plural fluid from right side the chest  x ray appears markedly improved. Plan to continue chest physiotherapy and wean O2 to optimize for dc. Repeating ABG tonight.   PAST MEDICAL HISTORY   Past Medical History:  Diagnosis Date   Acute respiratory failure with hypoxia (HCC) 07/29/2022   AKI (acute kidney injury)  04/04/2020   Bladder cancer (HCC) 2012   CAP (community acquired pneumonia) 04/04/2020   COPD suggested by initial evaluation 10/11/2022   COVID-19 virus infection 04/03/2020   Goals of care, counseling/discussion 05/19/2020   Hypertension    Influenza A with pneumonia 07/29/2022   Leg pain 01/25/2017   Lung cancer (HCC)    Malignant neoplasm of bladder, unspecified (HCC) 12/17/2010   Peripheral vascular disease    Sepsis (HCC) 07/29/2022     SURGICAL HISTORY   Past Surgical History:  Procedure Laterality Date   BLADDER REMOVAL     LOWER EXTREMITY ANGIOGRAPHY Left  04/12/2017   Procedure: Lower Extremity Angiography;  Surgeon: Jama Cordella MATSU, MD;  Location: [facility];  Service: Cardiovascular;  Laterality: Left;   PORTA CATH INSERTION N/A 05/22/2020   Procedure: PORTA CATH INSERTION;  Surgeon: Marea Selinda RAMAN, MD;  Location: [facility];  Service: Cardiovascular;  Laterality: N/A;   PORTA CATH REMOVAL N/A 11/15/2022   Procedure: PORTA CATH REMOVAL;  Surgeon: Marea Selinda RAMAN, MD;  Location: [facility];  Service: Cardiovascular;  Laterality: N/A;   uretostomy     VASCULAR SURGERY       FAMILY HISTORY   History reviewed. No pertinent family history.   SOCIAL HISTORY   Social History[1]   MEDICATIONS    Home Medication:  Current Outpatient Rx   Order #: [id number] Class: Normal    Current Medication: Current Medications[2]    ALLERGIES   Patient has no known allergies.     REVIEW OF SYSTEMS    Review of Systems:  Gen:  Denies  fever, sweats, chills weigh loss  HEENT: Denies blurred vision, double vision, ear pain, eye pain, hearing loss, nose bleeds, sore throat Cardiac:  No dizziness, chest pain or heaviness, chest tightness,edema Resp:   reports dyspnea chronically  Gi: Denies swallowing difficulty, stomach pain, nausea or vomiting, diarrhea, constipation, bowel incontinence Gu:  Denies bladder incontinence, burning urine Ext:   Denies Joint pain, stiffness or swelling Skin: Denies  skin rash, easy bruising or bleeding or hives Endoc:  Denies polyuria, polydipsia , polyphagia or weight change Psych:   Denies depression, insomnia or hallucinations   Other:  All other systems negative   VS: BP (!) 132/59 (BP Location: Left Arm)   Pulse 90   Temp 98.5 F (36.9 C)   Resp 20   Ht 5' 11 (1.803 m)   Wt 78 kg   SpO2 95%   BMI 23.98 kg/m      PHYSICAL EXAM    GENERAL:NAD, no fevers, chills, no weakness no fatigue HEAD: Normocephalic, atraumatic.  EYES: Pupils equal, round, reactive to  light. Extraocular muscles intact. No scleral icterus.  MOUTH: Moist mucosal membrane. Dentition intact. No abscess noted.  EAR, NOSE, THROAT: Clear without exudates. No external lesions.  NECK: Supple. No thyromegaly. No nodules. No JVD.  PULMONARY: decreased breath sounds with mild rhonchi worse at bases bilaterally.  CARDIOVASCULAR: S1 and S2. Regular rate and rhythm. No murmurs, rubs, or gallops. No edema. Pedal pulses 2+ bilaterally.  GASTROINTESTINAL: Soft, nontender, nondistended. No masses. Positive bowel sounds. No hepatosplenomegaly.  MUSCULOSKELETAL: No swelling, clubbing, or edema. Range of motion full in all extremities.  NEUROLOGIC: Cranial nerves II through XII are intact. No gross focal neurological deficits. Sensation intact. Reflexes intact.  SKIN: No ulceration, lesions, rashes, or cyanosis. Skin warm and dry. Turgor intact.  PSYCHIATRIC: Mood, affect within normal limits. The patient is awake, alert and oriented x  3. Insight, judgment intact.       IMAGING   Narrative & Impression  CLINICAL DATA:  Concern for pneumonia.   EXAM: CT CHEST WITHOUT CONTRAST   TECHNIQUE: Multidetector CT imaging of the chest was performed following the standard protocol without IV contrast.   RADIATION DOSE REDUCTION: This exam was performed according to the departmental dose-optimization program which includes automated exposure control, adjustment of the mA and/or kV according to patient size and/or use of iterative reconstruction technique.   COMPARISON:  Chest radiograph dated 07/10/2024 and CT dated 09/10/2022.   FINDINGS: Evaluation of this exam is limited in the absence of intravenous contrast.   Cardiovascular: There is mild cardiomegaly. No pericardial effusion. Advanced 3 vessel coronary vascular calcification. Mild atherosclerotic calcification of the thoracic aorta. No aneurysmal dilatation. There is dilatation of the main pulmonary trunk suggestive of pulmonary  hypertension.   Mediastinum/Nodes: No obvious hilar adenopathy. Evaluation however is limited due to consolidative changes of the lungs. The esophagus is grossly unremarkable no mediastinal fluid collection.   Lungs/Pleura: Moderate right pleural effusion. There is partial compressive atelectasis of the right lower lobe versus pneumonia. Background of emphysema and diffuse interstitial coarsening. Progression of bandlike consolidation extending from the right hilum into the anterior right upper lobe since the prior CT likely representing post treatment fibrosis. Pneumonia is not excluded. Additional area of consolidation in the lateral right middle lobe also concerning for pneumonia. No pneumothorax. The central airways are patent.   Upper Abdomen: No acute abnormality.   Musculoskeletal: No acute osseous pathology.   IMPRESSION: 1. Moderate right pleural effusion with partial compressive atelectasis of the right lower lobe versus pneumonia. 2. Progression of bandlike consolidation extending from the right hilum into the anterior right upper lobe since the prior CT likely representing post treatment fibrosis. Pneumonia is not excluded. 3. Additional area of consolidation in the lateral right middle lobe also concerning for pneumonia. 4. Mild cardiomegaly with advanced 3 vessel coronary vascular calcification. 5. Aortic Atherosclerosis (ICD10-I70.0) and Emphysema (ICD10-J43.9).     Electronically Signed   By: Vanetta Chou M.D.   On: 07/11/2024 16:20    ASSESSMENT/PLAN   Acute on chronic hypoxemic respiratory failure   Multifactorial in etiology including possible pneumonia of right lung, Cancer of right lung, moderate pleural effusion and atelectasis of right lower lobe.   - will target each underlying cause for improvement in oxygenation    Moderate right pleural effusion     - thoracentesis with pleural fluid studies    - IR consult with testing placed.    COPD  with acute exacerbation      - agree with current COPD carepath     - Doxycycline  IV bid with rocephin  empirically for CAP coverage     - steroids - soluemdrol is now being tapered to prednisone      - PT/OT chest PT    - nebulizer therapy with duoneb and inhaler therapy with Breo ellipta    Compressive atelectasis  Patient weak and unable to use incentive spirometry very well,  I have ordered metaneb therapy with respiratory therapist PT/OT     Physical deconditioning  - [facility]n would benefit [facility]        Thank you for allowing me to participate in the care of this patient.   Patient/Family are satisfied with care plan and all questions have been answered.    Provider disclosure: Patient with at least one acute or chronic illness or injury that  poses a threat to life or bodily function and is being managed actively during this encounter.  All of the below services have been performed independently by signing provider:  review of prior documentation from internal and or external health records.  Review of previous and current lab results.  Interview and comprehensive assessment during patient visit today. Review of current and previous chest radiographs/CT scans. Discussion of management and test interpretation with health care team and patient/family.   This document was prepared using Dragon voice recognition software and may include unintentional dictation errors.     Wyndell Cardiff, M.D.  Division of Pulmonary & Critical Care Medicine                 [1]  Social History Tobacco Use   Smoking status: Former    Current packs/day: 0.00    Average packs/day: 0.5 packs/day for 50.0 years (25.0 ttl pk-yrs)    Types: Cigarettes    Quit date: 08/2023    Years since quitting: 0.8   Smokeless tobacco: Never   Tobacco comments:    Per pt. He smokes 1 pack every 3 days  Vaping Use   Vaping status: Never Used  Substance Use Topics    Alcohol use: No   Drug use: Not Currently    Types: Marijuana  [2]  Current Facility-Administered Medications:    acetaminophen  (TYLENOL ) tablet 650 mg, 650 mg, Oral, Q6H PRN **OR** acetaminophen  (TYLENOL ) suppository 650 mg, 650 mg, Rectal, Q6H PRN, Mansy, Jan A, MD   acetylcysteine  (MUCOMYST ) 20 % nebulizer / oral solution 4 mL, 4 mL, Nebulization, TID, Makennah Omura, MD, 4 mL at 07/16/24 0720   allopurinol  (ZYLOPRIM ) tablet 100 mg, 100 mg, Oral, BID, Mansy, Jan A, MD, 100 mg at 07/16/24 9079   amLODipine  (NORVASC ) tablet 10 mg, 10 mg, Oral, Daily, Mansy, Jan A, MD, 10 mg at 07/16/24 9079   amoxicillin -clavulanate (AUGMENTIN ) 500-125 MG per tablet 1 tablet, 1 tablet, Oral, Q12H, Cordney Barstow, MD, 1 tablet at 07/16/24 0920   atorvastatin  (LIPITOR) tablet 20 mg, 20 mg, Oral, QPM, Mansy, Jan A, MD, 20 mg at 07/15/24 1801   chlorpheniramine-HYDROcodone  (TUSSIONEX) 10-8 MG/5ML suspension 5 mL, 5 mL, Oral, Q12H PRN, Mansy, Jan A, MD, 5 mL at 07/13/24 1120   clopidogrel  (PLAVIX ) tablet 75 mg, 75 mg, Oral, Daily, Mansy, Jan A, MD, 75 mg at 07/16/24 9078   empagliflozin  (JARDIANCE ) tablet 10 mg, 10 mg, Oral, Daily, Mansy, Jan A, MD, 10 mg at 07/16/24 9079   enoxaparin  (LOVENOX ) injection 30 mg, 30 mg, Subcutaneous, Q24H, Mansy, Jan A, MD, 30 mg at 07/16/24 0008   fluticasone  furoate-vilanterol (BREO ELLIPTA ) 200-25 MCG/ACT 1 puff, 1 puff, Inhalation, Daily, Agbata, Tochukwu, MD, 1 puff at 07/16/24 9078   guaiFENesin  (MUCINEX ) 12 hr tablet 600 mg, 600 mg, Oral, BID, Mansy, Jan A, MD, 600 mg at 07/16/24 9079   insulin  aspart (novoLOG ) injection 0-20 Units, 0-20 Units, Subcutaneous, TID WC, Mansy, Jan A, MD, 7 Units at 07/15/24 1801   insulin  aspart (novoLOG ) injection 0-5 Units, 0-5 Units, Subcutaneous, QHS, Mansy, Jan A, MD   ipratropium-albuterol  (DUONEB) 0.5-2.5 (3) MG/3ML nebulizer solution 3 mL, 3 mL, Nebulization, Q4H PRN, Mansy, Jan A, MD   ipratropium-albuterol  (DUONEB) 0.5-2.5 (3) MG/3ML  nebulizer solution 3 mL, 3 mL, Nebulization, TID, Agbata, Tochukwu, MD, 3 mL at 07/16/24 0720   loratadine  (CLARITIN ) tablet 10 mg, 10 mg, Oral, Daily, Mansy, Jan A, MD, 10 mg at 07/16/24 9079   magnesium  hydroxide (MILK OF  MAGNESIA) suspension 30 mL, 30 mL, Oral, Daily PRN, Mansy, Jan A, MD   ondansetron  (ZOFRAN ) tablet 4 mg, 4 mg, Oral, Q6H PRN **OR** ondansetron  (ZOFRAN ) injection 4 mg, 4 mg, Intravenous, Q6H PRN, Mansy, Jan A, MD   polyethylene glycol (MIRALAX  / GLYCOLAX ) packet 17 g, 17 g, Oral, Daily, Agbata, Tochukwu, MD, 17 g at 07/16/24 0920   senna-docusate (Senokot-S) tablet 2 tablet, 2 tablet, Oral, QHS, Agbata, Tochukwu, MD, 2 tablet at 07/16/24 0006   traZODone  (DESYREL ) tablet 25 mg, 25 mg, Oral, QHS PRN, Mansy, Jan A, MD, 25 mg at 07/11/24 2156  Facility-Administered Medications Ordered in Other Encounters:    sodium chloride  flush (NS) 0.9 % injection 10 mL, 10 mL, Intravenous, PRN, Melanee Annah BROCKS, MD, 10 mL at 09/18/20 0850  "

## 2024-07-16 NOTE — Progress Notes (Signed)
 " Progress Note   Patient: Martin Jennings FMW:[id number] DOB: [birth year] DOA: 07/10/2024     6 DOS: the patient was seen and examined on 07/16/2024   Brief hospital course:  Martin Jennings is a 73 y.o. male with medical history significant for COPD, type 2 diabetes mellitus, PVD, IBD and bronchiectasis, as well as lung cancer, essential hypertension, dyslipidemia, and gout, who presented to the emergency room with acute onset of worsening dyspnea over the last week with associated cough with excessive clear mucus production.  He has been having lightheadedness when attempting to walk to the bathroom.  He admitted to chills without reported fever.  The patient admitted to orthopnea and paroxysmal nocturnal dyspnea as well as dyspnea on exertion with lower extremity edema.  No fever or chills.  No nausea or vomiting or abdominal pain.  No dysuria, oliguria or hematuria or flank pain.  He denied any recent sick contacts.  He is at 1 L of O2 by nasal cannula at baseline.  Off of oxygen  he was satting in the 86s with EMS required 4 L of O2 by nasal cannula to bring his sats to the low 90s upon arrival to the ED.  No chest pain or palpitations.    12/19 -has a higher oxygen  requirement and is currently on high flow nasal cannula at 9 L with pulse oximetry of 94%.  Has a productive cough and complains of generalized weakness.   12/20 -patient was transferred to progressive care overnight due to lethargy, hypercapnia and uncompensated respiratory acidosis requiring noninvasive mechanical ventilation.   12/21 -patient is seen and examined at the bedside.  He is awake and alert.  Had a bowel movement overnight.  12/22 -scheduled for thoracentesis today     Assessment and Plan:   Acute on chronic respiratory failure with hypoxia and hypercapnia Acute COPD exacerbation Community-acquired pneumonia Moderate right pleural effusion ?? Empyema Patient presents to the ER for evaluation of worsening  shortness of breath from his baseline.At baseline patient wears 2 L of oxygen  continuous.   Was noted to have room air pulse oximetry in the 70's with tachypnea initially requiring oxygen  supplementation at 4 L to maintain pulse oximetry greater than 92%.  Patient developed an increased oxygen  requirement and was placed on high flow nasal cannula at 9 L.  He developed lethargy and increased somnolence and an arterial blood gas showed uncompensated respiratory acidosis. Appreciate pulmonary input 12/21 Continue BiPAP as needed during the day and at night.  He is currently on high flow nasal cannula at 7 L  To maintain pulse oximetry of 92%.  Appreciate pulmonary input, recommends Mucomyst  to improve expectoration. 12/22.  Continues to have an increased oxygen  requirement and remains on 8 L high flow nasal cannula to maintain pulse oximetry greater than 92%.  Unable to tolerate BiPAP but uses CPAP overnight  Worsening respiratory failure appears to be secondary to acute COPD exacerbation, post treatment fibrosis following treatment for Lung CA  as well as community-acquired pneumonia Continue systemic and inhaled steroids.  Prednisone  decreased to 30mg  daily Continue bronchodilator therapy Continue IV antibiotic therapy with Rocephin  and doxycycline  Appreciate pulmonary input Patient is status post thoracentesis with drainage of 900 mL of pleural fluid. (12/22).     Diabetes mellitus with complications of stage IV chronic kidney disease Blood sugars are stable Continue sliding scale insulin  Monitor renal function closely       AKI superimposed on stage IV chronic kidney disease  Baseline serum creatinine is  2.76, serum creatinine shows an upward trend and is at 3.24 Hold diuretic therapy Avoid nephrotoxic agents Monitor renal function closely     Chronic diastolic dysfunction CHF Does not appear to be acutely exacerbated at this time Last known LVEF of 60 - 65% from a 2D echocardiogram  which was done 12/18 Hold furosemide  for now due to worsening renal function. 2.76 >> 3.06 >> 3.03 Hold Jardiance   Continue amlodipine      History of lung cancer History of stage III adenocarcinoma of the lung status post chemo/radiation as well as maintenance durvalumab  Currently in remission Follow-up with oncology for surveillance     History of coronary artery disease Elevated troponin Troponin is flat and is most likely secondary to demand ischemia from tachypnea and respiratory distress Echocardiogram does not show any regional wall motion abnormalities Continue Plavix  and atorvastatin                   Subjective: Complains of constipation  Physical Exam: Vitals:   07/16/24 0800 07/16/24 1200 07/16/24 1300 07/16/24 1328  BP: (!) 159/84 (!) 143/75 129/62 (!) 132/59  Pulse: 77 81 89 90  Resp: 20 20    Temp: 98 F (36.7 C) 98.5 F (36.9 C)    TempSrc: Oral     SpO2: 91% 93% 92% 95%  Weight:      Height:       GENERAL:  73 y.o.-year-old Caucasian male patient lying in the bed.  Acutely ill-appearing EYES: Pupils equal, round, reactive to light and accommodation. No scleral icterus. Extraocular muscles intact.  HEENT: Head atraumatic, normocephalic. Oropharynx and nasopharynx clear.  NECK:  Supple, no jugular venous distention. No thyroid  enlargement, no tenderness.  LUNGS: Bilateral air entry, scattered rhonchi and wheezes CARDIOVASCULAR: Regular rate and rhythm, S1, S2 normal. No murmurs, rubs, or gallops.  ABDOMEN: Soft, nondistended, nontender. Bowel sounds present. No organomegaly or mass.  EXTREMITIES: No pedal edema NEUROLOGIC: Cranial nerves II through XII are intact. Muscle strength 5/5 in all extremities. Sensation intact. Gait not checked.  PSYCHIATRIC: The patient is alert and oriented x 3.  Normal affect and good eye contact. SKIN: No obvious rash, lesion, or ulcer.   Data Reviewed:  There are no new results to review at this time.  Family  Communication: Plan of care was discussed with patient in detail.  Disposition: Status is: Inpatient Remains inpatient appropriate because: PT eval for discharge recommendations  Planned Discharge Destination: TBD    Time spent: 40 minutes  Author: Aimee Somerset, MD 07/16/2024 2:51 PM  For on call review [email or web address]  "

## 2024-07-16 NOTE — Progress Notes (Signed)
 Milk of mag given for mild constipation.

## 2024-07-17 ENCOUNTER — Inpatient Hospital Stay

## 2024-07-17 DIAGNOSIS — J441 Chronic obstructive pulmonary disease with (acute) exacerbation: Secondary | ICD-10-CM | POA: Diagnosis not present

## 2024-07-17 LAB — CBC
HCT: 49.6 % (ref 39.0–52.0)
Hemoglobin: 15.5 g/dL (ref 13.0–17.0)
MCH: 31.1 pg (ref 26.0–34.0)
MCHC: 31.3 g/dL (ref 30.0–36.0)
MCV: 99.6 fL (ref 80.0–100.0)
Platelets: 99 K/uL — ABNORMAL LOW (ref 150–400)
RBC: 4.98 MIL/uL (ref 4.22–5.81)
RDW: 12.3 % (ref 11.5–15.5)
WBC: 6.2 K/uL (ref 4.0–10.5)
nRBC: 0 % (ref 0.0–0.2)

## 2024-07-17 LAB — COMPREHENSIVE METABOLIC PANEL WITH GFR
ALT: 31 U/L (ref 0–44)
AST: 24 U/L (ref 15–41)
Albumin: 3 g/dL — ABNORMAL LOW (ref 3.5–5.0)
Alkaline Phosphatase: 62 U/L (ref 38–126)
Anion gap: 5 (ref 5–15)
BUN: 63 mg/dL — ABNORMAL HIGH (ref 8–23)
CO2: 29 mmol/L (ref 22–32)
Calcium: 8.2 mg/dL — ABNORMAL LOW (ref 8.9–10.3)
Chloride: 104 mmol/L (ref 98–111)
Creatinine, Ser: 2.58 mg/dL — ABNORMAL HIGH (ref 0.61–1.24)
GFR, Estimated: 25 mL/min — ABNORMAL LOW
Glucose, Bld: 88 mg/dL (ref 70–99)
Potassium: 5.3 mmol/L — ABNORMAL HIGH (ref 3.5–5.1)
Sodium: 138 mmol/L (ref 135–145)
Total Bilirubin: 0.5 mg/dL (ref 0.0–1.2)
Total Protein: 5.8 g/dL — ABNORMAL LOW (ref 6.5–8.1)

## 2024-07-17 LAB — GLUCOSE, CAPILLARY
Glucose-Capillary: 91 mg/dL (ref 70–99)
Glucose-Capillary: 137 mg/dL — ABNORMAL HIGH (ref 70–99)
Glucose-Capillary: 174 mg/dL — ABNORMAL HIGH (ref 70–99)

## 2024-07-17 LAB — CYTOLOGY - NON PAP

## 2024-07-17 MED ORDER — SODIUM ZIRCONIUM CYCLOSILICATE 5 G PO PACK
10.0000 g | PACK | Freq: Every day | ORAL | Status: DC
  Administered 2024-07-17: 10 g via ORAL
  Filled 2024-07-17 (×3): qty 2
  Filled 2024-07-17: qty 1

## 2024-07-17 NOTE — Plan of Care (Signed)

## 2024-07-17 NOTE — TOC CM/SW Note (Signed)
 Patient is currently on 6 L of oxygen . He gets his home oxygen  through [employer] and their orders are for 2 L. If patient requires 4 L or more at discharge, they will need a liter change order.  Lauraine Carpen, CSW [phone]

## 2024-07-17 NOTE — Progress Notes (Signed)
 " Progress Note   Patient: Martin Jennings FMW:[id number] DOB: 1951/07/26 DOA: 07/10/2024     7 DOS: the patient was seen and examined on 07/17/2024   Brief hospital course:  Martin Jennings is a 73 y.o. male with medical history significant for COPD, type 2 diabetes mellitus, PVD, IBD and bronchiectasis, as well as lung cancer, essential hypertension, dyslipidemia, and gout, who presented to the emergency room with acute onset of worsening dyspnea over the last week with associated cough with excessive clear mucus production.  He has been having lightheadedness when attempting to walk to the bathroom.  He admitted to chills without reported fever.  The patient admitted to orthopnea and paroxysmal nocturnal dyspnea as well as dyspnea on exertion with lower extremity edema.  No fever or chills.  No nausea or vomiting or abdominal pain.  No dysuria, oliguria or hematuria or flank pain.  He denied any recent sick contacts.  He is at 1 L of O2 by nasal cannula at baseline.  Off of oxygen  he was satting in the 33s with EMS required 4 L of O2 by nasal cannula to bring his sats to the low 90s upon arrival to the ED.  No chest pain or palpitations.    12/19 -has a higher oxygen  requirement and is currently on high flow nasal cannula at 9 L with pulse oximetry of 94%.  Has a productive cough and complains of generalized weakness.   12/20 -patient was transferred to progressive care overnight due to lethargy, hypercapnia and uncompensated respiratory acidosis requiring noninvasive mechanical ventilation.   12/21 -patient is seen and examined at the bedside.  He is awake and alert.  Had a bowel movement overnight.   12/22 -scheduled for thoracentesis today  12/23 -remains on high flow nasal cannula at 6 L.  Feels better       Assessment and Plan:  Acute on chronic respiratory failure with hypoxia and hypercapnia Acute COPD exacerbation Community-acquired pneumonia Moderate right pleural effusion ??  Empyema Patient presents to the ER for evaluation of worsening shortness of breath from his baseline.At baseline patient wears 2 L of oxygen  continuous.   Was noted to have room air pulse oximetry in the 70's with tachypnea initially requiring oxygen  supplementation at 4 L to maintain pulse oximetry greater than 92%.  Patient developed an increased oxygen  requirement and was placed on high flow nasal cannula at 9 L.  He developed lethargy and increased somnolence and an arterial blood gas showed uncompensated respiratory acidosis. Appreciate pulmonary input 12/21 Continue BiPAP as needed during the day and at night.  He is currently on high flow nasal cannula at 7 L  To maintain pulse oximetry of 92%.  Appreciate pulmonary input, recommends Mucomyst  to improve expectoration. 12/22.  Continues to have an increased oxygen  requirement and remains on 8 L high flow nasal cannula to maintain pulse oximetry greater than 92%.  Unable to tolerate BiPAP but uses CPAP overnight 12/23.  Down to 6 L high flow nasal cannula   Worsening respiratory failure appears to be secondary to acute COPD exacerbation, post treatment fibrosis following treatment for Lung CA  as well as community-acquired pneumonia Continue systemic and inhaled steroids.  Prednisone  decreased to 30mg  daily Continue bronchodilator therapy Continue IV antibiotic therapy with Rocephin  and doxycycline  Appreciate pulmonary input Patient is status post thoracentesis with drainage of 900 mL of pleural fluid. (12/22). Body fluid culture showed no growth in less than 24 hours       Diabetes  mellitus with complications of stage IV chronic kidney disease Blood sugars are stable Continue sliding scale insulin  Monitor renal function closely       AKI superimposed on stage IV chronic kidney disease  Baseline serum creatinine is 2.76, serum creatinine shows an upward trend and is at 3.24 Hold diuretic therapy Avoid nephrotoxic agents Monitor  renal function closely     Chronic diastolic dysfunction CHF Does not appear to be acutely exacerbated at this time Last known LVEF of 60 - 65% from a 2D echocardiogram which was done 12/18 Hold furosemide  for now due to worsening renal function. 2.76 >> 3.06 >> 3.03 Hold Jardiance   Continue amlodipine      History of lung cancer History of stage III adenocarcinoma of the lung status post chemo/radiation as well as maintenance durvalumab  Currently in remission Follow-up with oncology for surveillance     History of coronary artery disease Elevated troponin Troponin is flat and is most likely secondary to demand ischemia from tachypnea and respiratory distress Echocardiogram does not show any regional wall motion abnormalities Continue Plavix  and atorvastatin        Hyperkalemia Will start patient on Lokelma  Repeat potassium levels in am      Physical Deconditioning PT evaluate and treat       Subjective: Patient is seen and examined at the bedside. Feels better  Physical Exam: Vitals:   07/17/24 0350 07/17/24 0500 07/17/24 0859 07/17/24 1200  BP: (!) 145/76  (!) 141/65 134/74  Pulse: 80  78 77  Resp: 20  18 17   Temp: 98.4 F (36.9 C)  98.4 F (36.9 C) 97.8 F (36.6 C)  TempSrc:   Oral   SpO2: 96%  94% 93%  Weight:  77.7 kg    Height:       GENERAL:  73 y.o.-year-old Caucasian male patient lying in the bed.  Acutely ill-appearing EYES: Pupils equal, round, reactive to light and accommodation. No scleral icterus. Extraocular muscles intact.  HEENT: Head atraumatic, normocephalic. Oropharynx and nasopharynx clear.  NECK:  Supple, no jugular venous distention. No thyroid  enlargement, no tenderness.  LUNGS: Bilateral air entry, scattered rhonchi and wheezes CARDIOVASCULAR: Regular rate and rhythm, S1, S2 normal. No murmurs, rubs, or gallops.  ABDOMEN: Soft, nondistended, nontender. Bowel sounds present. No organomegaly or mass.  EXTREMITIES: No pedal  edema NEUROLOGIC: Cranial nerves II through XII are intact. Muscle strength 5/5 in all extremities. Sensation intact. Gait not checked.  PSYCHIATRIC: The patient is alert and oriented x 3.  Normal affect and good eye contact. SKIN: No obvious rash, lesion, or ulcer.    Data Reviewed: Labs reviewed.  Potassium 5.3, urine BUN 63, creatinine 2.58 There are no new results to review at this time.  Family Communication: Plan of care was discussed with patient at the bedside. He verbalizes understanding and agrees with the plan  Disposition: Status is: Inpatient Remains inpatient appropriate because: PT evaluation   Planned Discharge Destination: Skilled nursing facility    Time spent: 40 minutes  Author: Aimee Somerset, MD 07/17/2024 3:30 PM  For on call review [email or web address]  "

## 2024-07-17 NOTE — Progress Notes (Signed)
 "    PULMONOLOGY         Date: 07/17/2024,   MRN# [id number] Martin Jennings Jan 03, 1951     AdmissionWeight: 82.9 kg                 CurrentWeight: 77.7 kg  Referring provider: Dr Lanetta   CHIEF COMPLAINT:   Acute on chronic hypoxemic respiratory failure   HISTORY OF PRESENT ILLNESS   Martin Jennings is a 73 y.o. male with medical history significant for COPD, type 2 diabetes mellitus, PVD, IBD and bronchiectasis, as well as lung cancer, essential hypertension, dyslipidemia, and gout, who presented to the emergency room with acute onset of worsening dyspnea over the last week with associated cough with excessive clear mucus production.  He has been having lightheadedness when attempting to walk to the bathroom.  He admitted to chills without reported fever.  The patient admitted to orthopnea and paroxysmal nocturnal dyspnea as well as dyspnea on exertion with lower extremity edema.  No fever or chills.  No nausea or vomiting or abdominal pain.  No dysuria, oliguria or hematuria or flank pain.  He denied any recent sick contacts.  He is at 1 L of O2 by nasal cannula at baseline.  Off of oxygen  he was satting in the 75s with EMS required 4 L of O2 by nasal cannula to bring his sats to the low 90s upon arrival to the ED.  No chest pain or palpitations. He had chest imaging done with findings of mod right pleural effusion on right via CT chest. There is associated compressive atelectasis. There are also fibrotic changes on right lung consistent with post treatment changes. PCCM consultation placed for additional evaluation and management of complex right lung process in patient with lung cancer and copd.   07/13/24- no overnight events. Patient has not had throacentesis done yet. He is diuresing well now appx 3L net negative. Ive sent a message to Dr Jenna with IR via Epic secure chat to notify regarding thoracentesis order. Patient is on 9L/min [name].  Martin Jennings is on IV doxy and rocephin  for  CAP tx empirically. Reduced prednisone  to 35mg  PO daily   07/14/24- patient seen at bedside.  He is up awake and alert to person place and time. He was unable to have thoracentesis done due to desaturation. He was noted to have hypercapnic respiratory failure on ABG and received BIPAP ventilation.  He was mildly acidemic on this abg.  Will repeat VBG tonight. He should continue the BIPAP at bedtime for now. I have narrowed his antibiotics to augmetin PO, and reduced prednisone  to 30mg .  He is eating well without episodes of aspiration.   07/15/24- patient on 7L/min.  He is coughing with expectoration of clearish phlegm.  He did not get thoracentesis still.  Im repeating VBG and CXR today to evaluate interval changes on pleural effusion. He is net negative 2L but has renal failure and it would benefit him getting the fluid drained.  Im ordering mucomyst  today TID with resp therapist to help him expetorate some of the inspissated mucus.   07/16/24- patient s/p thoracentesis. Post removal of 900 cc straw colored plural fluid from right side the chest  x ray appears markedly improved. Plan to continue chest physiotherapy and wean O2 to optimize for dc. Repeating ABG tonight.   07/17/24- patient seen at bedside. He is in no distress.  Has fine crackles at bases bilaterally , suspect atelectatic changes. Will repeat CXR today and  initiate metaneb with albuterol  .   PAST MEDICAL HISTORY   Past Medical History:  Diagnosis Date   Acute respiratory failure with hypoxia (HCC) 07/29/2022   AKI (acute kidney injury) 04/04/2020   Bladder cancer (HCC) 2012   CAP (community acquired pneumonia) 04/04/2020   COPD suggested by initial evaluation 10/11/2022   COVID-19 virus infection 04/03/2020   Goals of care, counseling/discussion 05/19/2020   Hypertension    Influenza A with pneumonia 07/29/2022   Leg pain 01/25/2017   Lung cancer (HCC)    Malignant neoplasm of bladder, unspecified (HCC) 12/17/2010    Peripheral vascular disease    Sepsis (HCC) 07/29/2022     SURGICAL HISTORY   Past Surgical History:  Procedure Laterality Date   BLADDER REMOVAL     LOWER EXTREMITY ANGIOGRAPHY Left 04/12/2017   Procedure: Lower Extremity Angiography;  Surgeon: Jama Cordella MATSU, MD;  Location: [facility] IN[facility]IVE [facility];  Service: Cardiovascular;  Laterality: Left;   PORTA CATH INSERTION N/A 05/22/2020   Procedure: PORTA CATH INSERTION;  Surgeon: Marea Selinda RAMAN, MD;  Location: [facility] IN[facility] CV [facility];  Service: Cardiovascular;  Laterality: N/A;   PORTA CATH REMOVAL N/A 11/15/2022   Procedure: PORTA CATH REMOVAL;  Surgeon: Marea Selinda RAMAN, MD;  Location: [facility] INV[facility] CV [facility];  Service: Cardiovascular;  Laterality: N/A;   uretostomy     VASCULAR SURGERY       FAMILY HISTORY   History reviewed. No pertinent family history.   SOCIAL HISTORY   Social History[1]   MEDICATIONS    Home Medication:  Current Outpatient Rx   Order #: [id number] Class: Normal    Current Medication: Current Medications[2]    ALLERGIES   Patient has no known allergies.     REVIEW OF SYSTEMS    Review of Systems:  Gen:  Denies  fever, sweats, chills weigh loss  HEENT: Denies blurred vision, double vision, ear pain, eye pain, hearing loss, nose bleeds, sore throat Cardiac:  No dizziness, chest pain or heaviness, chest tightness,edema Resp:   reports dyspnea chronically  Gi: Denies swallowing difficulty, stomach pain, nausea or vomiting, diarrhea, constipation, bowel incontinence Gu:  Denies bladder incontinence, burning urine Ext:   Denies Joint pain, stiffness or swelling Skin: Denies  skin rash, easy bruising or bleeding or hives Endoc:  Denies polyuria, polydipsia , polyphagia or weight change Psych:   Denies depression, insomnia or hallucinations   Other:  All other systems negative   VS: BP (!) 141/65 (BP Location: Left Arm)   Pulse 78   Temp 98.4 F (36.9 C) (Oral)   Resp 18   Ht 5' 11 (1.803 m)    Wt 77.7 kg   SpO2 94%   BMI 23.89 kg/m      PHYSICAL EXAM    GENERAL:NAD, no fevers, chills, no weakness no fatigue HEAD: Normocephalic, atraumatic.  EYES: Pupils equal, round, reactive to light. Extraocular muscles intact. No scleral icterus.  MOUTH: Moist mucosal membrane. Dentition intact. No abscess noted.  EAR, NOSE, THROAT: Clear without exudates. No external lesions.  NECK: Supple. No thyromegaly. No nodules. No JVD.  PULMONARY: decreased breath sounds with mild rhonchi worse at bases bilaterally.  CARDIOVASCULAR: S1 and S2. Regular rate and rhythm. No murmurs, rubs, or gallops. No edema. Pedal pulses 2+ bilaterally.  GASTROINTESTINAL: Soft, nontender, nondistended. No masses. Positive bowel sounds. No hepatosplenomegaly.  MUSCULOSKELETAL: No swelling, clubbing, or edema. Range of motion full in all extremities.  NEUROLOGIC: Cranial nerves II through XII are intact. No gross focal neurological  deficits. Sensation intact. Reflexes intact.  SKIN: No ulceration, lesions, rashes, or cyanosis. Skin warm and dry. Turgor intact.  PSYCHIATRIC: Mood, affect within normal limits. The patient is awake, alert and oriented x 3. Insight, judgment intact.       IMAGING   Narrative & Impression  CLINICAL DATA:  Concern for pneumonia.   EXAM: CT CHEST WITHOUT CONTRAST   TECHNIQUE: Multidetector CT imaging of the chest was performed following the standard protocol without IV contrast.   RADIATION DOSE REDUCTION: This exam was performed according to the departmental dose-optimization program which includes automated exposure control, adjustment of the mA and/or kV according to patient size and/or use of iterative reconstruction technique.   COMPARISON:  Chest radiograph dated 07/10/2024 and CT dated 09/10/2022.   FINDINGS: Evaluation of this exam is limited in the absence of intravenous contrast.   Cardiovascular: There is mild cardiomegaly. No pericardial effusion. Advanced 3  vessel coronary vascular calcification. Mild atherosclerotic calcification of the thoracic aorta. No aneurysmal dilatation. There is dilatation of the main pulmonary trunk suggestive of pulmonary hypertension.   Mediastinum/Nodes: No obvious hilar adenopathy. Evaluation however is limited due to consolidative changes of the lungs. The esophagus is grossly unremarkable no mediastinal fluid collection.   Lungs/Pleura: Moderate right pleural effusion. There is partial compressive atelectasis of the right lower lobe versus pneumonia. Background of emphysema and diffuse interstitial coarsening. Progression of bandlike consolidation extending from the right hilum into the anterior right upper lobe since the prior CT likely representing post treatment fibrosis. Pneumonia is not excluded. Additional area of consolidation in the lateral right middle lobe also concerning for pneumonia. No pneumothorax. The central airways are patent.   Upper Abdomen: No acute abnormality.   Musculoskeletal: No acute osseous pathology.   IMPRESSION: 1. Moderate right pleural effusion with partial compressive atelectasis of the right lower lobe versus pneumonia. 2. Progression of bandlike consolidation extending from the right hilum into the anterior right upper lobe since the prior CT likely representing post treatment fibrosis. Pneumonia is not excluded. 3. Additional area of consolidation in the lateral right middle lobe also concerning for pneumonia. 4. Mild cardiomegaly with advanced 3 vessel coronary vascular calcification. 5. Aortic Atherosclerosis (ICD10-I70.0) and Emphysema (ICD10-J43.9).     Electronically Signed   By: Vanetta Chou M.D.   On: 07/11/2024 16:20    ASSESSMENT/PLAN   Acute on chronic hypoxemic respiratory failure   Multifactorial in etiology including possible pneumonia of right lung, Cancer of right lung, moderate pleural effusion and atelectasis of right lower lobe.   -  will target each underlying cause for improvement in oxygenation    Moderate right pleural effusion     - IR consult -s/p thoracentesis   COPD with acute exacerbation      - agree with current COPD carepath     - Doxycycline  IV bid with rocephin  empirically for CAP coverage     - steroids - soluemdrol is now being tapered to prednisone      - PT/OT chest PT    - nebulizer therapy with duoneb and inhaler therapy with Breo ellipta    Compressive atelectasis  Patient weak and unable to use incentive spirometry very well,  I have ordered metaneb therapy with respiratory therapist PT/OT     Physical deconditioning  - [facility]n would benefit from [facility]        Thank you for allowing me to participate in the care of this patient.   Patient/Family are satisfied with care plan  and all questions have been answered.    Provider disclosure: Patient with at least one acute or chronic illness or injury that poses a threat to life or bodily function and is being managed actively during this encounter.  All of the below services have been performed independently by signing provider:  review of prior documentation from internal and or external health records.  Review of previous and current lab results.  Interview and comprehensive assessment during patient visit today. Review of current and previous chest radiographs/CT scans. Discussion of management and test interpretation with health care team and patient/family.   This document was prepared using Dragon voice recognition software and may include unintentional dictation errors.     Remon Quinto, M.D.  Division of Pulmonary & Critical Care Medicine                  [1]  Social History Tobacco Use   Smoking status: Former    Current packs/day: 0.00    Average packs/day: 0.5 packs/day for 50.0 years (25.0 ttl pk-yrs)    Types: Cigarettes    Quit date: 08/2023    Years since quitting: 0.8    Smokeless tobacco: Never   Tobacco comments:    Per pt. He smokes 1 pack every 3 days  Vaping Use   Vaping status: Never Used  Substance Use Topics   Alcohol use: No   Drug use: Not Currently    Types: Marijuana  [2]  Current Facility-Administered Medications:    acetaminophen  (TYLENOL ) tablet 650 mg, 650 mg, Oral, Q6H PRN **OR** acetaminophen  (TYLENOL ) suppository 650 mg, 650 mg, Rectal, Q6H PRN, Mansy, Jan A, MD   acetylcysteine  (MUCOMYST ) 20 % nebulizer / oral solution 4 mL, 4 mL, Nebulization, TID, Vista Sawatzky, MD, 4 mL at 07/17/24 0731   allopurinol  (ZYLOPRIM ) tablet 100 mg, 100 mg, Oral, BID, Mansy, Jan A, MD, 100 mg at 07/17/24 9094   amLODipine  (NORVASC ) tablet 10 mg, 10 mg, Oral, Daily, Mansy, Jan A, MD, 10 mg at 07/17/24 9094   amoxicillin -clavulanate (AUGMENTIN ) 500-125 MG per tablet 1 tablet, 1 tablet, Oral, Q12H, Agbata, Tochukwu, MD, 1 tablet at 07/17/24 9094   atorvastatin  (LIPITOR) tablet 20 mg, 20 mg, Oral, QPM, Mansy, Jan A, MD, 20 mg at 07/16/24 1744   chlorpheniramine-HYDROcodone  (TUSSIONEX) 10-8 MG/5ML suspension 5 mL, 5 mL, Oral, Q12H PRN, Mansy, Jan A, MD, 5 mL at 07/13/24 1120   clopidogrel  (PLAVIX ) tablet 75 mg, 75 mg, Oral, Daily, Mansy, Jan A, MD, 75 mg at 07/17/24 9094   empagliflozin  (JARDIANCE ) tablet 10 mg, 10 mg, Oral, Daily, Mansy, Jan A, MD, 10 mg at 07/17/24 9094   enoxaparin  (LOVENOX ) injection 30 mg, 30 mg, Subcutaneous, Q24H, Mansy, Jan A, MD, 30 mg at 07/16/24 2154   fluticasone  furoate-vilanterol (BREO ELLIPTA ) 200-25 MCG/ACT 1 puff, 1 puff, Inhalation, Daily, Agbata, Tochukwu, MD, 1 puff at 07/17/24 0857   guaiFENesin  (MUCINEX ) 12 hr tablet 600 mg, 600 mg, Oral, BID, Mansy, Jan A, MD, 600 mg at 07/17/24 9094   insulin  aspart (novoLOG ) injection 0-20 Units, 0-20 Units, Subcutaneous, TID WC, Mansy, Jan A, MD, 4 Units at 07/16/24 1744   insulin  aspart (novoLOG ) injection 0-5 Units, 0-5 Units, Subcutaneous, QHS, Mansy, Jan A, MD    ipratropium-albuterol  (DUONEB) 0.5-2.5 (3) MG/3ML nebulizer solution 3 mL, 3 mL, Nebulization, Q4H PRN, Mansy, Jan A, MD   ipratropium-albuterol  (DUONEB) 0.5-2.5 (3) MG/3ML nebulizer solution 3 mL, 3 mL, Nebulization, TID, Agbata, Tochukwu, MD, 3 mL at 07/17/24 [phone]  loratadine  (CLARITIN ) tablet 10 mg, 10 mg, Oral, Daily, Mansy, Jan A, MD, 10 mg at 07/17/24 9094   magnesium  hydroxide (MILK OF MAGNESIA) suspension 30 mL, 30 mL, Oral, Daily PRN, Mansy, Jan A, MD, 30 mL at 07/16/24 1839   ondansetron  (ZOFRAN ) tablet 4 mg, 4 mg, Oral, Q6H PRN **OR** ondansetron  (ZOFRAN ) injection 4 mg, 4 mg, Intravenous, Q6H PRN, Mansy, Jan A, MD   polyethylene glycol (MIRALAX  / GLYCOLAX ) packet 17 g, 17 g, Oral, Daily, Agbata, Tochukwu, MD, 17 g at 07/17/24 9095   senna-docusate (Senokot-S) tablet 2 tablet, 2 tablet, Oral, QHS, Agbata, Tochukwu, MD, 2 tablet at 07/16/24 0006   sodium zirconium cyclosilicate  (LOKELMA ) packet 10 g, 10 g, Oral, Daily, Agbata, Tochukwu, MD, 10 g at 07/17/24 9096   traZODone  (DESYREL ) tablet 25 mg, 25 mg, Oral, QHS PRN, Mansy, Jan A, MD, 25 mg at 07/11/24 2156  Facility-Administered Medications Ordered in Other Encounters:    sodium chloride  flush (NS) 0.9 % injection 10 mL, 10 mL, Intravenous, PRN, Melanee Annah BROCKS, MD, 10 mL at 09/18/20 0850  "

## 2024-07-17 NOTE — Evaluation (Signed)
 Physical Therapy Evaluation Patient Details Name: Martin Jennings MRN: [id number] DOB: 1951/05/03 Today's Date: 07/17/2024  History of Present Illness  presented to ER secondary to progressive SOB, cough; admitted for management of COPD exacerbation, acute/chronic CHF exacerbation.  Hospital course significant for R thoracentesis (12/22) with removal of fluid.  Clinical Impression  Patient resting in bed upon arrival to room; alert and oriented, follows commands and agreeable to participation with session.  Eager for OOB mobility as tolerated. Bilat UE/LE strength and ROM grossly symmetrical and WFL; no focal weakness appreciated.  Able to complete bed mobility with mod indep; sit/stand, basic transfers and gait (220') with RW, cga/close sup.  Demonstrates reciprocal stepping pattern with good step height/length; fair, steady cadence without buckling or LOB. Mild SOB with gait efforts; cuing for activity pacing and awareness of fatigue/dyspnea levels. Desat to 79-80% with gait distance, requiring 2-3 min of seated rest and pursed lip breathing for recovery >90% on 6L supplemental O2  Would benefit from skilled PT to address above deficits and promote optimal return to PLOF.; recommend post-acute PT follow up as indicated by interdisciplinary care team.            If plan is discharge home, recommend the following: A little help with walking and/or transfers;A little help with bathing/dressing/bathroom   Can travel by private vehicle        Equipment Recommendations    Recommendations for Other Services       Functional Status Assessment Patient has had a recent decline in their functional status and demonstrates the ability to make significant improvements in function in a reasonable and predictable amount of time.     Precautions / Restrictions Precautions Precautions: Fall Restrictions Weight Bearing Restrictions Per Provider Order: No      Mobility  Bed Mobility Overal  bed mobility: Modified Independent                  Transfers Overall transfer level: Needs assistance   Transfers: Sit to/from Stand Sit to Stand: Contact guard assist, Supervision                Ambulation/Gait Ambulation/Gait assistance: Contact guard assist, Supervision Gait Distance (Feet): 220 Feet Assistive device: Rolling walker (2 wheels)         General Gait Details: reciprocal stepping pattern with good step height/length; fair, steady cadence without buckling or LOB. Mild SOB with gait efforts; cuing for activity pacing and awareness of fatigue/dyspnea levels.  Desat to 79-80% with gait distance, requiring 2-3 min of seated rest and pursed lip breathing for recovery >90% on 6L supplemental O2  Stairs            Wheelchair Mobility     Tilt Bed    Modified Rankin (Stroke Patients Only)       Balance Overall balance assessment: Needs assistance Sitting-balance support: No upper extremity supported, Feet supported Sitting balance-Leahy Scale: Good     Standing balance support: Bilateral upper extremity supported Standing balance-Leahy Scale: Good                               Pertinent Vitals/Pain Pain Assessment Pain Assessment: No/denies pain    Home Living Family/patient expects to be discharged to:: Private residence Living Arrangements: Spouse/significant other;Children Available Help at Discharge: Family Type of Home: House Home Access: Stairs to enter Entrance Stairs-Rails: Right Entrance Stairs-Number of Steps: 4   Home Layout: One level  Prior Function Prior Level of Function : Independent/Modified Independent             Mobility Comments: Mod indep with ADLs, household and limited community mobilization without assist device; home O2 when I need it; denies fall history       Extremity/Trunk Assessment   Upper Extremity Assessment Upper Extremity Assessment: Overall WFL for tasks  assessed    Lower Extremity Assessment Lower Extremity Assessment: Overall WFL for tasks assessed       Communication   Communication Communication: No apparent difficulties    Cognition Arousal: Alert Behavior During Therapy: WFL for tasks assessed/performed   PT - Cognitive impairments: No apparent impairments                         Following commands: Intact       Cueing Cueing Techniques: Verbal cues     General Comments      Exercises     Assessment/Plan    PT Assessment Patient needs continued PT services  PT Problem List Decreased activity tolerance;Decreased balance;Decreased mobility;Decreased knowledge of use of DME;Decreased safety awareness;Decreased knowledge of precautions;Cardiopulmonary status limiting activity       PT Treatment Interventions DME instruction;Gait training;Stair training;Functional mobility training;Patient/family education;Therapeutic activities;Therapeutic exercise;Balance training    PT Goals (Current goals can be found in the Care Plan section)  Acute Rehab PT Goals Patient Stated Goal: to get up and move around PT Goal Formulation: With patient Time For Goal Achievement: 07/31/24 Potential to Achieve Goals: Good    Frequency Min 2X/week     Co-evaluation               AM-PAC PT 6 Clicks Mobility  Outcome Measure Help needed turning from your back to your side while in a flat bed without using bedrails?: None Help needed moving from lying on your back to sitting on the side of a flat bed without using bedrails?: None Help needed moving to and from a bed to a chair (including a wheelchair)?: None Help needed standing up from a chair using your arms (e.g., wheelchair or bedside chair)?: A Little Help needed to walk in hospital room?: A Little Help needed climbing 3-5 steps with a railing? : A Little 6 Click Score: 21    End of Session Equipment Utilized During Treatment: Gait belt;Oxygen  Activity  Tolerance: Patient tolerated treatment well Patient left: in chair;with call bell/phone within reach;with chair alarm set Nurse Communication: Mobility status PT Visit Diagnosis: Difficulty in walking, not elsewhere classified (R26.2)    Time: 8598-8578 PT Time Calculation (min) (ACUTE ONLY): 20 min   Charges:   PT Evaluation $PT Eval Moderate Complexity: 1 Mod   PT General Charges $$ ACUTE PT VISIT: 1 Visit         Vonne Mcdanel H. Delores, PT, DPT, NCS 07/17/2024, 6:00 PM [phone]

## 2024-07-18 DIAGNOSIS — J441 Chronic obstructive pulmonary disease with (acute) exacerbation: Secondary | ICD-10-CM | POA: Diagnosis not present

## 2024-07-18 LAB — GLUCOSE, BODY FLUID OTHER: Glucose, Body Fluid Other: 146 mg/dL

## 2024-07-18 LAB — BASIC METABOLIC PANEL WITH GFR
Anion gap: 7 (ref 5–15)
BUN: 55 mg/dL — ABNORMAL HIGH (ref 8–23)
CO2: 26 mmol/L (ref 22–32)
Calcium: 8.4 mg/dL — ABNORMAL LOW (ref 8.9–10.3)
Chloride: 104 mmol/L (ref 98–111)
Creatinine, Ser: 2.65 mg/dL — ABNORMAL HIGH (ref 0.61–1.24)
GFR, Estimated: 25 mL/min — ABNORMAL LOW
Glucose, Bld: 187 mg/dL — ABNORMAL HIGH (ref 70–99)
Potassium: 4.7 mmol/L (ref 3.5–5.1)
Sodium: 137 mmol/L (ref 135–145)

## 2024-07-18 LAB — GLUCOSE, CAPILLARY
Glucose-Capillary: 196 mg/dL — ABNORMAL HIGH (ref 70–99)
Glucose-Capillary: 147 mg/dL — ABNORMAL HIGH (ref 70–99)
Glucose-Capillary: 150 mg/dL — ABNORMAL HIGH (ref 70–99)

## 2024-07-18 LAB — PROTEIN, BODY FLUID (OTHER): Total Protein, Body Fluid Other: 3.2 g/dL

## 2024-07-18 NOTE — TOC Progression Note (Signed)
 Transition of Care [facility]) - Progression Note    Patient Details  Name: Martin Jennings MRN: [id number] Date of Birth: 1951/03/18  [facility] CM/SW Contact  Shasta DELENA Daring, RN Phone Number: 07/18/2024, 4:29 PM  Clinical Narrative:      Verified patient is ok with [facility] services. Initiated [facility] search.  Spoke with wife. She was unable to identify the specific service the patient currently receives and could not remember the name of the company. She will tell the bedside nurse to share the info with case management tomorrow.                   Expected Discharge Plan and Services                                               Social Drivers of Health (SDOH) Interventions SDOH Screenings   Food Insecurity: No Food Insecurity (07/10/2024)  Housing: Low Risk (07/10/2024)  Transportation Needs: No Transportation Needs (07/10/2024)  Utilities: Not At Risk (07/10/2024)  Depression (PHQ2-9): Medium Risk (07/03/2024)  Social Connections: Moderately Isolated (07/10/2024)  Stress: No Stress Concern Present (07/03/2024)  Tobacco Use: Medium Risk (07/10/2024)    Readmission Risk Interventions     No data to display

## 2024-07-18 NOTE — Progress Notes (Signed)
 "    PULMONOLOGY         Date: 07/18/2024,   MRN# [id number] Martin Jennings 03/08/1951     AdmissionWeight: 82.9 kg                 CurrentWeight: 74.7 kg  Referring provider: Dr Lanetta   CHIEF COMPLAINT:   Acute on chronic hypoxemic respiratory failure   HISTORY OF PRESENT ILLNESS   Martin Jennings is a 73 y.o. male with medical history significant for COPD, type 2 diabetes mellitus, PVD, IBD and bronchiectasis, as well as lung cancer, essential hypertension, dyslipidemia, and gout, who presented to the emergency room with acute onset of worsening dyspnea over the last week with associated cough with excessive clear mucus production.  He has been having lightheadedness when attempting to walk to the bathroom.  He admitted to chills without reported fever.  The patient admitted to orthopnea and paroxysmal nocturnal dyspnea as well as dyspnea on exertion with lower extremity edema.  No fever or chills.  No nausea or vomiting or abdominal pain.  No dysuria, oliguria or hematuria or flank pain.  He denied any recent sick contacts.  He is at 1 L of O2 by nasal cannula at baseline.  Off of oxygen  he was satting in the 4s with EMS required 4 L of O2 by nasal cannula to bring his sats to the low 90s upon arrival to the ED.  No chest pain or palpitations. He had chest imaging done with findings of mod right pleural effusion on right via CT chest. There is associated compressive atelectasis. There are also fibrotic changes on right lung consistent with post treatment changes. PCCM consultation placed for additional evaluation and management of complex right lung process in patient with lung cancer and copd.   07/13/24- no overnight events. Patient has not had throacentesis done yet. He is diuresing well now appx 3L net negative. Ive sent a message to Dr Jenna with IR via Epic secure chat to notify regarding thoracentesis order. Patient is on 9L/min [place].  Mr Garretson is on IV doxy and rocephin  for  CAP tx empirically. Reduced prednisone  to 35mg  PO daily   07/14/24- patient seen at bedside.  He is up awake and alert to person place and time. He was unable to have thoracentesis done due to desaturation. He was noted to have hypercapnic respiratory failure on ABG and received BIPAP ventilation.  He was mildly acidemic on this abg.  Will repeat VBG tonight. He should continue the BIPAP at bedtime for now. I have narrowed his antibiotics to augmetin PO, and reduced prednisone  to 30mg .  He is eating well without episodes of aspiration.   07/15/24- patient on 7L/min.  He is coughing with expectoration of clearish phlegm.  He did not get thoracentesis still.  Im repeating VBG and CXR today to evaluate interval changes on pleural effusion. He is net negative 2L but has renal failure and it would benefit him getting the fluid drained.  Im ordering mucomyst  today TID with resp therapist to help him expetorate some of the inspissated mucus.   07/16/24- patient s/p thoracentesis. Post removal of 900 cc straw colored plural fluid from right side the chest  x ray appears markedly improved. Plan to continue chest physiotherapy and wean O2 to optimize for dc. Repeating ABG tonight.   07/17/24- patient seen at bedside. He is in no distress.  Has fine crackles at bases bilaterally , suspect atelectatic changes. Will repeat CXR today and  initiate metaneb with albuterol  .   07/18/24-  patient on 4L/min, not confused this morning. Renal function and BUN have improved. CXR with improved opacification bilaterally. Patient was able to walk around hallway, states he feels a little weak.   PAST MEDICAL HISTORY   Past Medical History:  Diagnosis Date   Acute respiratory failure with hypoxia (HCC) 07/29/2022   AKI (acute kidney injury) 04/04/2020   Bladder cancer (HCC) 2012   CAP (community acquired pneumonia) 04/04/2020   COPD suggested by initial evaluation 10/11/2022   COVID-19 virus infection 04/03/2020   Goals  of care, counseling/discussion 05/19/2020   Hypertension    Influenza A with pneumonia 07/29/2022   Leg pain 01/25/2017   Lung cancer (HCC)    Malignant neoplasm of bladder, unspecified (HCC) 12/17/2010   Peripheral vascular disease    Sepsis (HCC) 07/29/2022     SURGICAL HISTORY   Past Surgical History:  Procedure Laterality Date   BLADDER REMOVAL     LOWER EXTREMITY ANGIOGRAPHY Left 04/12/2017   Procedure: Lower Extremity Angiography;  Surgeon: Jama Cordella MATSU, MD;  Location: [facility];  Service: Cardiovascular;  Laterality: Left;   PORTA CATH INSERTION N/A 05/22/2020   Procedure: PORTA CATH INSERTION;  Surgeon: Marea Selinda RAMAN, MD;  Location: [facility];  Service: Cardiovascular;  Laterality: N/A;   PORTA CATH REMOVAL N/A 11/15/2022   Procedure: PORTA CATH REMOVAL;  Surgeon: Marea Selinda RAMAN, MD;  Location: [facility];  Service: Cardiovascular;  Laterality: N/A;   uretostomy     VASCULAR SURGERY       FAMILY HISTORY   History reviewed. No pertinent family history.   SOCIAL HISTORY   Social History[1]   MEDICATIONS    Home Medication:  Current Outpatient Rx   Order #: [id number] Class: Normal    Current Medication: Current Medications[2]    ALLERGIES   Patient has no known allergies.     REVIEW OF SYSTEMS    Review of Systems:  Gen:  Denies  fever, sweats, chills weigh loss  HEENT: Denies blurred vision, double vision, ear pain, eye pain, hearing loss, nose bleeds, sore throat Cardiac:  No dizziness, chest pain or heaviness, chest tightness,edema Resp:   reports dyspnea chronically  Gi: Denies swallowing difficulty, stomach pain, nausea or vomiting, diarrhea, constipation, bowel incontinence Gu:  Denies bladder incontinence, burning urine Ext:   Denies Joint pain, stiffness or swelling Skin: Denies  skin rash, easy bruising or bleeding or hives Endoc:  Denies polyuria, polydipsia , polyphagia or weight change Psych:    Denies depression, insomnia or hallucinations   Other:  All other systems negative   VS: BP 132/67   Pulse 86   Temp 98.2 F (36.8 C)   Resp 16   Ht 5' 11 (1.803 m)   Wt 74.7 kg   SpO2 92%   BMI 22.97 kg/m      PHYSICAL EXAM    GENERAL:NAD, no fevers, chills, no weakness no fatigue HEAD: Normocephalic, atraumatic.  EYES: Pupils equal, round, reactive to light. Extraocular muscles intact. No scleral icterus.  MOUTH: Moist mucosal membrane. Dentition intact. No abscess noted.  EAR, NOSE, THROAT: Clear without exudates. No external lesions.  NECK: Supple. No thyromegaly. No nodules. No JVD.  PULMONARY: decreased breath sounds with mild rhonchi worse at bases bilaterally.  CARDIOVASCULAR: S1 and S2. Regular rate and rhythm. No murmurs, rubs, or gallops. No edema. Pedal pulses 2+ bilaterally.  GASTROINTESTINAL: Soft, nontender, nondistended. No masses. Positive bowel sounds.  No hepatosplenomegaly.  MUSCULOSKELETAL: No swelling, clubbing, or edema. Range of motion full in all extremities.  NEUROLOGIC: Cranial nerves II through XII are intact. No gross focal neurological deficits. Sensation intact. Reflexes intact.  SKIN: No ulceration, lesions, rashes, or cyanosis. Skin warm and dry. Turgor intact.  PSYCHIATRIC: Mood, affect within normal limits. The patient is awake, alert and oriented x 3. Insight, judgment intact.       IMAGING   Narrative & Impression  CLINICAL DATA:  Concern for pneumonia.   EXAM: CT CHEST WITHOUT CONTRAST   TECHNIQUE: Multidetector CT imaging of the chest was performed following the standard protocol without IV contrast.   RADIATION DOSE REDUCTION: This exam was performed according to the departmental dose-optimization program which includes automated exposure control, adjustment of the mA and/or kV according to patient size and/or use of iterative reconstruction technique.   COMPARISON:  Chest radiograph dated 07/10/2024 and CT  dated 09/10/2022.   FINDINGS: Evaluation of this exam is limited in the absence of intravenous contrast.   Cardiovascular: There is mild cardiomegaly. No pericardial effusion. Advanced 3 vessel coronary vascular calcification. Mild atherosclerotic calcification of the thoracic aorta. No aneurysmal dilatation. There is dilatation of the main pulmonary trunk suggestive of pulmonary hypertension.   Mediastinum/Nodes: No obvious hilar adenopathy. Evaluation however is limited due to consolidative changes of the lungs. The esophagus is grossly unremarkable no mediastinal fluid collection.   Lungs/Pleura: Moderate right pleural effusion. There is partial compressive atelectasis of the right lower lobe versus pneumonia. Background of emphysema and diffuse interstitial coarsening. Progression of bandlike consolidation extending from the right hilum into the anterior right upper lobe since the prior CT likely representing post treatment fibrosis. Pneumonia is not excluded. Additional area of consolidation in the lateral right middle lobe also concerning for pneumonia. No pneumothorax. The central airways are patent.   Upper Abdomen: No acute abnormality.   Musculoskeletal: No acute osseous pathology.   IMPRESSION: 1. Moderate right pleural effusion with partial compressive atelectasis of the right lower lobe versus pneumonia. 2. Progression of bandlike consolidation extending from the right hilum into the anterior right upper lobe since the prior CT likely representing post treatment fibrosis. Pneumonia is not excluded. 3. Additional area of consolidation in the lateral right middle lobe also concerning for pneumonia. 4. Mild cardiomegaly with advanced 3 vessel coronary vascular calcification. 5. Aortic Atherosclerosis (ICD10-I70.0) and Emphysema (ICD10-J43.9).     Electronically Signed   By: Vanetta Chou M.D.   On: 07/11/2024 16:20    ASSESSMENT/PLAN   Acute on chronic  hypoxemic respiratory failure   Multifactorial in etiology including possible pneumonia of right lung, Cancer of right lung, moderate pleural effusion and atelectasis of right lower lobe.   - will target each underlying cause for improvement in oxygenation    Moderate right pleural effusion     - IR consult -s/p thoracentesis   COPD with acute exacerbation      - agree with current COPD carepath     - Doxycycline  IV bid with rocephin  empirically for CAP coverage     - steroids - soluemdrol is now being tapered to prednisone      - PT/OT chest PT    - nebulizer therapy with duoneb and inhaler therapy with Breo ellipta    Compressive atelectasis  Patient weak and unable to use incentive spirometry very well,  I have ordered metaneb therapy with respiratory therapist PT/OT     Physical deconditioning  - patietn would benefit from [facility]        Thank you for allowing me to participate in the care of this patient.   Patient/Family are satisfied with care plan and all questions have been answered.    Provider disclosure: Patient with at least one acute or chronic illness or injury that poses a threat to life or bodily function and is being managed actively during this encounter.  All of the below services have been performed independently by signing provider:  review of prior documentation from internal and or external health records.  Review of previous and current lab results.  Interview and comprehensive assessment during patient visit today. Review of current and previous chest radiographs/CT scans. Discussion of management and test interpretation with health care team and patient/family.   This document was prepared using Dragon voice recognition software and may include unintentional dictation errors.     Jyssica Rief, M.D.  Division of Pulmonary & Critical Care Medicine                   [1]  Social History Tobacco Use    Smoking status: Former    Current packs/day: 0.00    Average packs/day: 0.5 packs/day for 50.0 years (25.0 ttl pk-yrs)    Types: Cigarettes    Quit date: 08/2023    Years since quitting: 0.8   Smokeless tobacco: Never   Tobacco comments:    Per pt. He smokes 1 pack every 3 days  Vaping Use   Vaping status: Never Used  Substance Use Topics   Alcohol use: No   Drug use: Not Currently    Types: Marijuana  [2]  Current Facility-Administered Medications:    acetaminophen  (TYLENOL ) tablet 650 mg, 650 mg, Oral, Q6H PRN **OR** acetaminophen  (TYLENOL ) suppository 650 mg, 650 mg, Rectal, Q6H PRN, Mansy, Jan A, MD   acetylcysteine  (MUCOMYST ) 20 % nebulizer / oral solution 4 mL, 4 mL, Nebulization, TID, Antwian Santaana, MD, 4 mL at 07/18/24 0820   allopurinol  (ZYLOPRIM ) tablet 100 mg, 100 mg, Oral, BID, Mansy, Jan A, MD, 100 mg at 07/18/24 9241   amLODipine  (NORVASC ) tablet 10 mg, 10 mg, Oral, Daily, Mansy, Jan A, MD, 10 mg at 07/18/24 9241   amoxicillin -clavulanate (AUGMENTIN ) 500-125 MG per tablet 1 tablet, 1 tablet, Oral, Q12H, Agbata, Tochukwu, MD, 1 tablet at 07/18/24 0757   atorvastatin  (LIPITOR) tablet 20 mg, 20 mg, Oral, QPM, Mansy, Jan A, MD, 20 mg at 07/17/24 1749   chlorpheniramine-HYDROcodone  (TUSSIONEX) 10-8 MG/5ML suspension 5 mL, 5 mL, Oral, Q12H PRN, Mansy, Jan A, MD, 5 mL at 07/13/24 1120   clopidogrel  (PLAVIX ) tablet 75 mg, 75 mg, Oral, Daily, Mansy, Jan A, MD, 75 mg at 07/18/24 0757   empagliflozin  (JARDIANCE ) tablet 10 mg, 10 mg, Oral, Daily, Mansy, Jan A, MD, 10 mg at 07/18/24 0756   enoxaparin  (LOVENOX ) injection 30 mg, 30 mg, Subcutaneous, Q24H, Mansy, Jan A, MD, 30 mg at 07/17/24 2159   fluticasone  furoate-vilanterol (BREO ELLIPTA ) 200-25 MCG/ACT 1 puff, 1 puff, Inhalation, Daily, Agbata, Tochukwu, MD, 1 puff at 07/18/24 0754   guaiFENesin  (MUCINEX ) 12 hr tablet 600 mg, 600 mg, Oral, BID, Mansy, Jan A, MD, 600 mg at 07/18/24 9244   insulin  aspart (novoLOG ) injection 0-20  Units, 0-20 Units, Subcutaneous, TID WC, Mansy, Jan A, MD, 4 Units at 07/18/24 0806   ipratropium-albuterol  (DUONEB) 0.5-2.5 (3) MG/3ML nebulizer solution 3 mL, 3 mL, Nebulization, Q4H PRN, Mansy, Jan A, MD   ipratropium-albuterol  (DUONEB) 0.5-2.5 (3) MG/3ML nebulizer solution 3 mL,  3 mL, Nebulization, TID, Agbata, Tochukwu, MD, 3 mL at 07/18/24 0820   loratadine  (CLARITIN ) tablet 10 mg, 10 mg, Oral, Daily, Mansy, Jan A, MD, 10 mg at 07/18/24 9244   magnesium  hydroxide (MILK OF MAGNESIA) suspension 30 mL, 30 mL, Oral, Daily PRN, Mansy, Jan A, MD, 30 mL at 07/16/24 1839   ondansetron  (ZOFRAN ) tablet 4 mg, 4 mg, Oral, Q6H PRN **OR** ondansetron  (ZOFRAN ) injection 4 mg, 4 mg, Intravenous, Q6H PRN, Mansy, Jan A, MD   polyethylene glycol (MIRALAX  / GLYCOLAX ) packet 17 g, 17 g, Oral, Daily, Agbata, Tochukwu, MD, 17 g at 07/18/24 0754   senna-docusate (Senokot-S) tablet 2 tablet, 2 tablet, Oral, QHS, Agbata, Tochukwu, MD, 2 tablet at 07/17/24 2159   sodium zirconium cyclosilicate  (LOKELMA ) packet 10 g, 10 g, Oral, Daily, Agbata, Tochukwu, MD, 10 g at 07/17/24 9096   traZODone  (DESYREL ) tablet 25 mg, 25 mg, Oral, QHS PRN, Mansy, Jan A, MD, 25 mg at 07/11/24 2156  Facility-Administered Medications Ordered in Other Encounters:    sodium chloride  flush (NS) 0.9 % injection 10 mL, 10 mL, Intravenous, PRN, Melanee Annah BROCKS, MD, 10 mL at 09/18/20 0850  "

## 2024-07-18 NOTE — Progress Notes (Addendum)
 Mobility Specialist - Progress Note On 6 L Pre-mobility: HR-80,  SpO2-91%  During mobility: HR-98, SpO2-85% recovered to 92% in less than 1 min 30 sec  Post-mobility: HR-95, , SPO2-93%      07/18/24 1500  Mobility  Activity Ambulated with assistance;Stood at bedside;Dangled on edge of bed  Level of Assistance Contact guard assist, steadying assist  Assistive Device Front wheel walker  Distance Ambulated (ft) 185 ft  Range of Motion/Exercises All extremities  Activity Response Tolerated well  Mobility visit 1 Mobility  Mobility Specialist Start Time (ACUTE ONLY) 1434  Mobility Specialist Stop Time (ACUTE ONLY) 1458  Mobility Specialist Time Calculation (min) (ACUTE ONLY) 24 min   Pt was supine in bed with the HOB elevated on 6 L of O2 upon entry receiving treatment. Pt agreed to mobility. Pt O2 vitals were taken throughout activity as a precaution. Pt is able today to get to the EOB independently with bed features. Pt is able today to STS independently with a 2 WW. Pt ambulated well. Pt did utilize the recovery break to check vitals and use PBT. After recovery pt continued activity. After activity pt returned back to the room , back in bed with needs in reach and bed alarm on upon exit.  Clem Rodes Mobility Specialist 07/18/2024, 3:36 PM

## 2024-07-18 NOTE — Progress Notes (Signed)
 " Progress Note   Patient: Martin Jennings FMW:[id number] DOB: June 24, 1951 DOA: 07/10/2024     8 DOS: the patient was seen and examined on 07/18/2024   Brief hospital course:  Martin Jennings is a 73 y.o. male with medical history significant for COPD, type 2 diabetes mellitus, PVD, IBD and bronchiectasis, as well as lung cancer, essential hypertension, dyslipidemia, and gout, who presented to the emergency room with acute onset of worsening dyspnea over the last week with associated cough with excessive clear mucus production.  He has been having lightheadedness when attempting to walk to the bathroom.  He admitted to chills without reported fever.  The patient admitted to orthopnea and paroxysmal nocturnal dyspnea as well as dyspnea on exertion with lower extremity edema.  No fever or chills.  No nausea or vomiting or abdominal pain.  No dysuria, oliguria or hematuria or flank pain.  He denied any recent sick contacts.  He is at 1 L of O2 by nasal cannula at baseline.  Off of oxygen  he was satting in the 53s with EMS required 4 L of O2 by nasal cannula to bring his sats to the low 90s upon arrival to the ED.  No chest pain or palpitations.    12/19 -has a higher oxygen  requirement and is currently on high flow nasal cannula at 9 L with pulse oximetry of 94%.  Has a productive cough and complains of generalized weakness.   12/20 -patient was transferred to progressive care overnight due to lethargy, hypercapnia and uncompensated respiratory acidosis requiring noninvasive mechanical ventilation.   12/21 -patient is seen and examined at the bedside.  He is awake and alert.  Had a bowel movement overnight.   12/22 -scheduled for thoracentesis today   12/23 -remains on high flow nasal cannula at 6 L.  Feels better  12/24 - Down to 4L of oxygen . Appears comfortable       Assessment and Plan:  Acute on chronic respiratory failure with hypoxia and hypercapnia Acute COPD  exacerbation Community-acquired pneumonia Moderate right pleural effusion ?? Empyema Patient presents to the ER for evaluation of worsening shortness of breath from his baseline.At baseline patient wears 2 L of oxygen  continuous.   Was noted to have room air pulse oximetry in the 70's with tachypnea initially requiring oxygen  supplementation at 4 L to maintain pulse oximetry greater than 92%.  Patient developed an increased oxygen  requirement and was placed on high flow nasal cannula at 9 L.  He developed lethargy and increased somnolence and an arterial blood gas showed uncompensated respiratory acidosis. Appreciate pulmonary input 12/21 Continue BiPAP as needed during the day and at night.  He is currently on high flow nasal cannula at 7 L  To maintain pulse oximetry of 92%.  Appreciate pulmonary input, recommends Mucomyst  to improve expectoration. 12/22.  Continues to have an increased oxygen  requirement and remains on 8 L high flow nasal cannula to maintain pulse oximetry greater than 92%.  Unable to tolerate BiPAP but uses CPAP overnight 12/23.  Down to 6 L high flow nasal cannula 12/24 Down to 4L which appears to be his baseline   Worsening respiratory failure appears to be secondary to acute COPD exacerbation, post treatment fibrosis following treatment for Lung CA  as well as community-acquired pneumonia Continue inhaled steroids. Prednisone  has been discotinued Continue bronchodilator therapy Continue IV antibiotic therapy with Rocephin  and doxycycline  Appreciate pulmonary input Patient is status post thoracentesis with drainage of 900 mL of pleural fluid. (12/22). Body  fluid culture showed no growth in less than 24 hours and appears to be a transudate       Diabetes mellitus with complications of stage IV chronic kidney disease Blood sugars are stable Continue sliding scale insulin  Monitor renal function closely       AKI superimposed on stage IV chronic kidney disease   Baseline serum creatinine is 2.76, serum creatinine shows an upward trend and is at 3.24 Continue to hold diuretic therapy Avoid nephrotoxic agents Monitor renal function closely     Chronic diastolic dysfunction CHF Does not appear to be acutely exacerbated at this time Last known LVEF of 60 - 65% from a 2D echocardiogram which was done 12/18 Hold furosemide  for now due to worsening renal function. 2.76 >> 3.06 >> 3.03 Hold Jardiance   Continue amlodipine      History of lung cancer History of stage III adenocarcinoma of the lung status post chemo/radiation as well as maintenance durvalumab  Currently in remission Follow-up with oncology for surveillance     History of coronary artery disease Elevated troponin Troponin is flat and is most likely secondary to demand ischemia from tachypnea and respiratory distress Echocardiogram does not show any regional wall motion abnormalities Continue Plavix  and atorvastatin        Hyperkalemia Resolved on Lokelma        Physical Deconditioning Appreciate PT evaluation Recommend home health upon discharge           Subjective: Patient is seen and examined at the bedside. No new complaints. Feels better  Physical Exam: Vitals:   07/18/24 0434 07/18/24 0500 07/18/24 0758 07/18/24 1126  BP: (!) 142/76  (!) 140/67 132/67  Pulse: 76  74 86  Resp: 20  16 16   Temp: 97.8 F (36.6 C)  97.7 F (36.5 C) 98.2 F (36.8 C)  TempSrc: Oral     SpO2: 94%  93% 92%  Weight:  74.7 kg    Height:       GENERAL:  73 y.o.-year-old Caucasian male patient lying in the bed.  Acutely ill-appearing EYES: Pupils equal, round, reactive to light and accommodation. No scleral icterus. Extraocular muscles intact.  HEENT: Head atraumatic, normocephalic. Oropharynx and nasopharynx clear.  NECK:  Supple, no jugular venous distention. No thyroid  enlargement, no tenderness.  LUNGS: Bilateral air entry, scattered rhonchi and wheezes CARDIOVASCULAR: Regular  rate and rhythm, S1, S2 normal. No murmurs, rubs, or gallops.  ABDOMEN: Soft, nondistended, nontender. Bowel sounds present. No organomegaly or mass.  EXTREMITIES: No pedal edema NEUROLOGIC: Cranial nerves II through XII are intact. Muscle strength 5/5 in all extremities. Sensation intact. Gait not checked.  PSYCHIATRIC: The patient is alert and oriented x 3.  Normal affect and good eye contact. SKIN: No obvious rash, lesion, or ulcer.     Data Reviewed: BUN 55, Cr 2.65 Labs reviewed  Family Communication: Plan of care was discussed with patient's wife over the phone. All questions and concerns have been addressed  Disposition: Status is: Inpatient Remains inpatient appropriate because: Discharge planning  Planned Discharge Destination: Home with Home Health    Time spent: 40 minutes  Author: Aimee Somerset, MD 07/18/2024 1:41 PM  For on call review [email or web address].  "

## 2024-07-18 NOTE — Plan of Care (Signed)

## 2024-07-19 DIAGNOSIS — J441 Chronic obstructive pulmonary disease with (acute) exacerbation: Secondary | ICD-10-CM | POA: Diagnosis not present

## 2024-07-19 LAB — BASIC METABOLIC PANEL WITH GFR
Anion gap: 7 (ref 5–15)
BUN: 48 mg/dL — ABNORMAL HIGH (ref 8–23)
CO2: 27 mmol/L (ref 22–32)
Calcium: 8.3 mg/dL — ABNORMAL LOW (ref 8.9–10.3)
Chloride: 105 mmol/L (ref 98–111)
Creatinine, Ser: 2.57 mg/dL — ABNORMAL HIGH (ref 0.61–1.24)
GFR, Estimated: 26 mL/min — ABNORMAL LOW
Glucose, Bld: 152 mg/dL — ABNORMAL HIGH (ref 70–99)
Potassium: 5.4 mmol/L — ABNORMAL HIGH (ref 3.5–5.1)
Sodium: 139 mmol/L (ref 135–145)

## 2024-07-19 LAB — BODY FLUID CULTURE W GRAM STAIN
Culture: NO GROWTH
Gram Stain: NONE SEEN

## 2024-07-19 LAB — GLUCOSE, CAPILLARY
Glucose-Capillary: 113 mg/dL — ABNORMAL HIGH (ref 70–99)
Glucose-Capillary: 163 mg/dL — ABNORMAL HIGH (ref 70–99)
Glucose-Capillary: 112 mg/dL — ABNORMAL HIGH (ref 70–99)

## 2024-07-19 MED ORDER — SODIUM ZIRCONIUM CYCLOSILICATE 10 G PO PACK
10.0000 g | PACK | Freq: Every day | ORAL | Status: DC
  Administered 2024-07-19 – 2024-07-22 (×4): 10 g via ORAL
  Filled 2024-07-19 (×4): qty 1

## 2024-07-19 MED ORDER — FLUTICASONE PROPIONATE 50 MCG/ACT NA SUSP
1.0000 | Freq: Every day | NASAL | Status: DC
  Administered 2024-07-19 – 2024-07-22 (×3): 1 via NASAL
  Filled 2024-07-19 (×3): qty 16

## 2024-07-19 NOTE — Plan of Care (Signed)

## 2024-07-19 NOTE — Progress Notes (Addendum)
 " Progress Note   Patient: Martin Jennings FMW:[id number] DOB: August 25, 1950 DOA: 07/10/2024     9 DOS: the patient was seen and examined on 07/19/2024   Brief hospital course:  Martin Jennings is a 73 y.o. male with medical history significant for COPD, type 2 diabetes mellitus, PVD, IBD and bronchiectasis, as well as lung cancer, essential hypertension, dyslipidemia, and gout, who presented to the emergency room with acute onset of worsening dyspnea over the last week with associated cough with excessive clear mucus production.  He has been having lightheadedness when attempting to walk to the bathroom.  Was admitted for acute on chronic hypoxic and hypercapnic respiratory failure, acute COPD exacerbation.  On presentation was on BiPAP, required HFNC and now on 4 L [place].  Hospital course complicated by AKI on CKD which improved, found to have moderate right-sided pleural effusion s/p thoracentesis.  Hospital course as below   Assessment and Plan: Acute on chronic respiratory failure with hypoxia and hypercapnia Multifactorial including pneumonia right lung, cancer right lung, moderate right pleural effusion, atelectasis of right lower lobe Oxygen  requirements have significantly improved was on BiPAP/HFNC, now at 4 L [place], At baseline patient wears 2 L of oxygen  continuous wean oxygen  as able May need Bipap at home, Pulmonary following, appreciate recs  Acute COPD exacerbation Atelectasis Was lethargic, placed on noninvasive ventilation, now improved Empiric antibiotics on Augmentin  to cover for possible pneumonia Continue inhaled steroids, bronchodilator therapy Completed steroids Has mild hemoptysis, repeat sputum culture Appreciate pulmonary input  Moderate right-sided pleural effusion S/p thoracentesis 12/22, 900 mL pleural fluid drained Body fluid culture showed no growth in less than 24 hours and appears to be a transudate   Chronic diastolic dysfunction CHF HTN Does not appear to be  acutely exacerbated at this time Last known LVEF of 60 - 65% from a 2D echocardiogram which was done 12/18 Lasix  was held due to AKI Continue amlodipine , Jardiance   AKI superimposed on stage IV chronic kidney disease  Baseline serum creatinine ~ 2.7-1.9 , serum creatinine peaked up to 3.25, and slowly improved to 2.57 Avoid nephrotoxic agents Monitor creatinine  Hyperkalemia On [place]  daily   Diabetes mellitus with complications of stage IV chronic kidney disease Blood sugars are stable Continue sliding scale insulin   History of lung cancer History of stage III adenocarcinoma of the lung status post chemo/radiation as well as maintenance durvalumab  Currently in remission Follow-up with oncology for surveillance  History of coronary artery disease Elevated troponin Troponin is flat and is most likely secondary to demand ischemia from tachypnea and respiratory distress Echocardiogram does not show any regional wall motion abnormalities Continue Plavix  and atorvastatin   Physical Deconditioning Appreciate PT evaluation Recommend home health upon discharge    Subjective: Patient is seen and examined at the bedside.  Reports having intermittent hemoptysis and sputum Repeat cultures sent, pulmonary following  Physical Exam: Vitals:   07/19/24 0445 07/19/24 0556 07/19/24 0818 07/19/24 0823  BP: (!) 141/74  (!) 153/73   Pulse: 79  83   Resp:   16   Temp: 98 F (36.7 C)  97.9 F (36.6 C)   TempSrc: Oral     SpO2: 92%  91% 92%  Weight:  74.4 kg    Height:       GENERAL:  74 y.o.-year-old Caucasian male patient lying in the bed NECK:  Supple, no jugular venous distention. No thyroid  enlargement, no tenderness.  LUNGS: Bilateral air entry, chronic dyspnea CARDIOVASCULAR: Regular rate and rhythm, S1, S2  normal. No murmurs, rubs, or gallops.  ABDOMEN: Soft, nondistended, nontender. Bowel sounds present. No organomegaly or mass.  EXTREMITIES: No pedal edema NEUROLOGIC:  Cranial nerves II through XII are intact. Muscle strength 5/5 in all extremities. Sensation intact. Gait not checked.  PSYCHIATRIC: The patient is alert and oriented x 3.  Normal affect and good eye contact. SKIN: No obvious rash, lesion, or ulcer.     Data Reviewed: Labs reviewed  Family Communication: Plan of care was discussed with patient's wife over the phone. All questions and concerns have been addressed  Disposition: Status is: Inpatient Remains inpatient appropriate because: Discharge planning  Planned Discharge Destination: Home with Home Health    Time spent: 56 minutes  Author: Laree Lock, MD 07/19/2024 9:19 AM  For on call review [email or web address].  "

## 2024-07-19 NOTE — Progress Notes (Signed)
 "    PULMONOLOGY         Date: 07/19/2024,   MRN# [id number] Martin Jennings 1951/02/06     AdmissionWeight: 82.9 kg                 CurrentWeight: 74.4 kg  Referring provider: Dr Lanetta   CHIEF COMPLAINT:   Acute on chronic hypoxemic respiratory failure   HISTORY OF PRESENT ILLNESS   Martin Jennings is a 73 y.o. male with medical history significant for COPD, type 2 diabetes mellitus, PVD, IBD and bronchiectasis, as well as lung cancer, essential hypertension, dyslipidemia, and gout, who presented to the emergency room with acute onset of worsening dyspnea over the last week with associated cough with excessive clear mucus production.  He has been having lightheadedness when attempting to walk to the bathroom.  He admitted to chills without reported fever.  The patient admitted to orthopnea and paroxysmal nocturnal dyspnea as well as dyspnea on exertion with lower extremity edema.  No fever or chills.  No nausea or vomiting or abdominal pain.  No dysuria, oliguria or hematuria or flank pain.  He denied any recent sick contacts.  He is at 1 L of O2 by nasal cannula at baseline.  Off of oxygen  he was satting in the 40s with EMS required 4 L of O2 by nasal cannula to bring his sats to the low 90s upon arrival to the ED.  No chest pain or palpitations. He had chest imaging done with findings of mod right pleural effusion on right via CT chest. There is associated compressive atelectasis. There are also fibrotic changes on right lung consistent with post treatment changes. PCCM consultation placed for additional evaluation and management of complex right lung process in patient with lung cancer and copd.   07/13/24- no overnight events. Patient has not had throacentesis done yet. He is diuresing well now appx 3L net negative. Ive sent a message to Dr Jenna with IR via Epic secure chat to notify regarding thoracentesis order. Patient is on 9L/min [place].  Martin Jennings is on IV doxy and rocephin  for  CAP tx empirically. Reduced prednisone  to 35mg  PO daily   07/14/24- patient seen at bedside.  He is up awake and alert to person place and time. He was unable to have thoracentesis done due to desaturation. He was noted to have hypercapnic respiratory failure on ABG and received BIPAP ventilation.  He was mildly acidemic on this abg.  Will repeat VBG tonight. He should continue the BIPAP at bedtime for now. I have narrowed his antibiotics to augmetin PO, and reduced prednisone  to 30mg .  He is eating well without episodes of aspiration.   07/15/24- patient on 7L/min.  He is coughing with expectoration of clearish phlegm.  He did not get thoracentesis still.  Im repeating VBG and CXR today to evaluate interval changes on pleural effusion. He is net negative 2L but has renal failure and it would benefit him getting the fluid drained.  Im ordering mucomyst  today TID with resp therapist to help him expetorate some of the inspissated mucus.   07/16/24- patient s/p thoracentesis. Post removal of 900 cc straw colored plural fluid from right side the chest  x ray appears markedly improved. Plan to continue chest physiotherapy and wean O2 to optimize for dc. Repeating ABG tonight.   07/17/24- patient seen at bedside. He is in no distress.  Has fine crackles at bases bilaterally , suspect atelectatic changes. Will repeat CXR today and  initiate metaneb with albuterol  .   07/18/24-  patient on 4L/min, not confused this morning. Renal function and BUN have improved. CXR with improved opacification bilaterally. Patient was able to walk around hallway, states he feels a little weak.   07/19/24- patient seen at bedside.  He is on home setting of oxygen  requirement. At this point he may need BIPAP at home.  I have ordered this today and can follow up with him.  PAST MEDICAL HISTORY   Past Medical History:  Diagnosis Date   Acute respiratory failure with hypoxia (HCC) 07/29/2022   AKI (acute kidney injury)  04/04/2020   Bladder cancer (HCC) 2012   CAP (community acquired pneumonia) 04/04/2020   COPD suggested by initial evaluation 10/11/2022   COVID-19 virus infection 04/03/2020   Goals of care, counseling/discussion 05/19/2020   Hypertension    Influenza A with pneumonia 07/29/2022   Leg pain 01/25/2017   Lung cancer (HCC)    Malignant neoplasm of bladder, unspecified (HCC) 12/17/2010   Peripheral vascular disease    Sepsis (HCC) 07/29/2022     SURGICAL HISTORY   Past Surgical History:  Procedure Laterality Date   BLADDER REMOVAL     LOWER EXTREMITY ANGIOGRAPHY Left 04/12/2017   Procedure: Lower Extremity Angiography;  Surgeon: Jama Cordella MATSU, MD;  Location: [facility];  Service: Cardiovascular;  Laterality: Left;   PORTA CATH INSERTION N/A 05/22/2020   Procedure: PORTA CATH INSERTION;  Surgeon: Marea Selinda RAMAN, MD;  Location: [facility];  Service: Cardiovascular;  Laterality: N/A;   PORTA CATH REMOVAL N/A 11/15/2022   Procedure: PORTA CATH REMOVAL;  Surgeon: Marea Selinda RAMAN, MD;  Location: [facility];  Service: Cardiovascular;  Laterality: N/A;   uretostomy     VASCULAR SURGERY       FAMILY HISTORY   History reviewed. No pertinent family history.   SOCIAL HISTORY   Social History[1]   MEDICATIONS    Home Medication:  Current Outpatient Rx   Order #: [id number] Class: Normal    Current Medication: Current Medications[2]    ALLERGIES   Patient has no known allergies.     REVIEW OF SYSTEMS    Review of Systems:  Gen:  Denies  fever, sweats, chills weigh loss  HEENT: Denies blurred vision, double vision, ear pain, eye pain, hearing loss, nose bleeds, sore throat Cardiac:  No dizziness, chest pain or heaviness, chest tightness,edema Resp:   reports dyspnea chronically  Gi: Denies swallowing difficulty, stomach pain, nausea or vomiting, diarrhea, constipation, bowel incontinence Gu:  Denies bladder incontinence, burning  urine Ext:   Denies Joint pain, stiffness or swelling Skin: Denies  skin rash, easy bruising or bleeding or hives Endoc:  Denies polyuria, polydipsia , polyphagia or weight change Psych:   Denies depression, insomnia or hallucinations   Other:  All other systems negative   VS: BP (!) 153/73 (BP Location: Left Arm)   Pulse 83   Temp 97.9 F (36.6 C)   Resp 16   Ht 5' 11 (1.803 m)   Wt 74.4 kg   SpO2 92%   BMI 22.88 kg/m      PHYSICAL EXAM    GENERAL:NAD, no fevers, chills, no weakness no fatigue HEAD: Normocephalic, atraumatic.  EYES: Pupils equal, round, reactive to light. Extraocular muscles intact. No scleral icterus.  MOUTH: Moist mucosal membrane. Dentition intact. No abscess noted.  EAR, NOSE, THROAT: Clear without exudates. No external lesions.  NECK: Supple. No thyromegaly. No nodules. No JVD.  PULMONARY: decreased breath sounds with mild rhonchi worse at bases bilaterally.  CARDIOVASCULAR: S1 and S2. Regular rate and rhythm. No murmurs, rubs, or gallops. No edema. Pedal pulses 2+ bilaterally.  GASTROINTESTINAL: Soft, nontender, nondistended. No masses. Positive bowel sounds. No hepatosplenomegaly.  MUSCULOSKELETAL: No swelling, clubbing, or edema. Range of motion full in all extremities.  NEUROLOGIC: Cranial nerves II through XII are intact. No gross focal neurological deficits. Sensation intact. Reflexes intact.  SKIN: No ulceration, lesions, rashes, or cyanosis. Skin warm and dry. Turgor intact.  PSYCHIATRIC: Mood, affect within normal limits. The patient is awake, alert and oriented x 3. Insight, judgment intact.       IMAGING   Narrative & Impression  CLINICAL DATA:  Concern for pneumonia.   EXAM: CT CHEST WITHOUT CONTRAST   TECHNIQUE: Multidetector CT imaging of the chest was performed following the standard protocol without IV contrast.   RADIATION DOSE REDUCTION: This exam was performed according to the departmental dose-optimization program  which includes automated exposure control, adjustment of the mA and/or kV according to patient size and/or use of iterative reconstruction technique.   COMPARISON:  Chest radiograph dated 07/10/2024 and CT dated 09/10/2022.   FINDINGS: Evaluation of this exam is limited in the absence of intravenous contrast.   Cardiovascular: There is mild cardiomegaly. No pericardial effusion. Advanced 3 vessel coronary vascular calcification. Mild atherosclerotic calcification of the thoracic aorta. No aneurysmal dilatation. There is dilatation of the main pulmonary trunk suggestive of pulmonary hypertension.   Mediastinum/Nodes: No obvious hilar adenopathy. Evaluation however is limited due to consolidative changes of the lungs. The esophagus is grossly unremarkable no mediastinal fluid collection.   Lungs/Pleura: Moderate right pleural effusion. There is partial compressive atelectasis of the right lower lobe versus pneumonia. Background of emphysema and diffuse interstitial coarsening. Progression of bandlike consolidation extending from the right hilum into the anterior right upper lobe since the prior CT likely representing post treatment fibrosis. Pneumonia is not excluded. Additional area of consolidation in the lateral right middle lobe also concerning for pneumonia. No pneumothorax. The central airways are patent.   Upper Abdomen: No acute abnormality.   Musculoskeletal: No acute osseous pathology.   IMPRESSION: 1. Moderate right pleural effusion with partial compressive atelectasis of the right lower lobe versus pneumonia. 2. Progression of bandlike consolidation extending from the right hilum into the anterior right upper lobe since the prior CT likely representing post treatment fibrosis. Pneumonia is not excluded. 3. Additional area of consolidation in the lateral right middle lobe also concerning for pneumonia. 4. Mild cardiomegaly with advanced 3 vessel coronary  vascular calcification. 5. Aortic Atherosclerosis (ICD10-I70.0) and Emphysema (ICD10-J43.9).     Electronically Signed   By: Vanetta Chou M.D.   On: 07/11/2024 16:20    ASSESSMENT/PLAN   Acute on chronic hypoxemic respiratory failure   Multifactorial in etiology including possible pneumonia of right lung, Cancer of right lung, moderate pleural effusion and atelectasis of right lower lobe.   - will target each underlying cause for improvement in oxygenation    Moderate right pleural effusion     - IR consult -s/p thoracentesis   COPD with acute exacerbation      - agree with current COPD carepath     - Doxycycline  IV bid with rocephin  empirically for CAP coverage     - steroids - soluemdrol is now being tapered to prednisone      - PT/OT chest PT    - nebulizer therapy with duoneb and inhaler therapy with Breo ellipta   Compressive atelectasis  Patient weak and unable to use incentive spirometry very well,  I have ordered metaneb therapy with respiratory therapist PT/OT     Physical deconditioning  - patietn would benefit from [facility]        Thank you for allowing me to participate in the care of this patient.   Patient/Family are satisfied with care plan and all questions have been answered.    Provider disclosure: Patient with at least one acute or chronic illness or injury that poses a threat to life or bodily function and is being managed actively during this encounter.  All of the below services have been performed independently by signing provider:  review of prior documentation from internal and or external health records.  Review of previous and current lab results.  Interview and comprehensive assessment during patient visit today. Review of current and previous chest radiographs/CT scans. Discussion of management and test interpretation with health care team and patient/family.   This document was prepared using Dragon voice  recognition software and may include unintentional dictation errors.     Acie Custis, M.D.  Division of Pulmonary & Critical Care Medicine                    [1]  Social History Tobacco Use   Smoking status: Former    Current packs/day: 0.00    Average packs/day: 0.5 packs/day for 50.0 years (25.0 ttl pk-yrs)    Types: Cigarettes    Quit date: 08/2023    Years since quitting: 0.8   Smokeless tobacco: Never   Tobacco comments:    Per pt. He smokes 1 pack every 3 days  Vaping Use   Vaping status: Never Used  Substance Use Topics   Alcohol use: No   Drug use: Not Currently    Types: Marijuana  [2]  Current Facility-Administered Medications:    acetaminophen  (TYLENOL ) tablet 650 mg, 650 mg, Oral, Q6H PRN **OR** acetaminophen  (TYLENOL ) suppository 650 mg, 650 mg, Rectal, Q6H PRN, Mansy, Jan A, MD   acetylcysteine  (MUCOMYST ) 20 % nebulizer / oral solution 4 mL, 4 mL, Nebulization, TID, Stepheny Canal, MD, 4 mL at 07/19/24 9177   allopurinol  (ZYLOPRIM ) tablet 100 mg, 100 mg, Oral, BID, Mansy, Jan A, MD, 100 mg at 07/19/24 1021   amLODipine  (NORVASC ) tablet 10 mg, 10 mg, Oral, Daily, Mansy, Jan A, MD, 10 mg at 07/19/24 1021   amoxicillin -clavulanate (AUGMENTIN ) 500-125 MG per tablet 1 tablet, 1 tablet, Oral, Q12H, Agbata, Tochukwu, MD, 1 tablet at 07/18/24 2101   atorvastatin  (LIPITOR) tablet 20 mg, 20 mg, Oral, QPM, Mansy, Jan A, MD, 20 mg at 07/18/24 1802   chlorpheniramine-HYDROcodone  (TUSSIONEX) 10-8 MG/5ML suspension 5 mL, 5 mL, Oral, Q12H PRN, Mansy, Jan A, MD, 5 mL at 07/13/24 1120   clopidogrel  (PLAVIX ) tablet 75 mg, 75 mg, Oral, Daily, Mansy, Jan A, MD, 75 mg at 07/19/24 1021   empagliflozin  (JARDIANCE ) tablet 10 mg, 10 mg, Oral, Daily, Mansy, Jan A, MD, 10 mg at 07/19/24 1021   enoxaparin  (LOVENOX ) injection 30 mg, 30 mg, Subcutaneous, Q24H, Mansy, Jan A, MD, 30 mg at 07/18/24 2101   fluticasone  furoate-vilanterol (BREO ELLIPTA ) 200-25 MCG/ACT 1 puff, 1  puff, Inhalation, Daily, Agbata, Tochukwu, MD, 1 puff at 07/19/24 1019   guaiFENesin  (MUCINEX ) 12 hr tablet 600 mg, 600 mg, Oral, BID, Mansy, Jan A, MD, 600 mg at 07/19/24 1021   insulin  aspart (novoLOG ) injection 0-20 Units, 0-20 Units, Subcutaneous, TID WC,  Mansy, Madison LABOR, MD, 3 Units at 07/18/24 1801   ipratropium-albuterol  (DUONEB) 0.5-2.5 (3) MG/3ML nebulizer solution 3 mL, 3 mL, Nebulization, Q4H PRN, Mansy, Jan A, MD   ipratropium-albuterol  (DUONEB) 0.5-2.5 (3) MG/3ML nebulizer solution 3 mL, 3 mL, Nebulization, TID, Agbata, Tochukwu, MD, 3 mL at 07/19/24 9180   loratadine  (CLARITIN ) tablet 10 mg, 10 mg, Oral, Daily, Mansy, Jan A, MD, 10 mg at 07/19/24 1021   magnesium  hydroxide (MILK OF MAGNESIA) suspension 30 mL, 30 mL, Oral, Daily PRN, Mansy, Jan A, MD, 30 mL at 07/16/24 1839   ondansetron  (ZOFRAN ) tablet 4 mg, 4 mg, Oral, Q6H PRN **OR** ondansetron  (ZOFRAN ) injection 4 mg, 4 mg, Intravenous, Q6H PRN, Mansy, Jan A, MD   polyethylene glycol (MIRALAX  / GLYCOLAX ) packet 17 g, 17 g, Oral, Daily, Agbata, Tochukwu, MD, 17 g at 07/19/24 1020   senna-docusate (Senokot-S) tablet 2 tablet, 2 tablet, Oral, QHS, Agbata, Tochukwu, MD, 2 tablet at 07/18/24 2101   traZODone  (DESYREL ) tablet 25 mg, 25 mg, Oral, QHS PRN, Mansy, Jan A, MD, 25 mg at 07/11/24 2156  Facility-Administered Medications Ordered in Other Encounters:    sodium chloride  flush (NS) 0.9 % injection 10 mL, 10 mL, Intravenous, PRN, Melanee Annah BROCKS, MD, 10 mL at 09/18/20 0850  "

## 2024-07-20 DIAGNOSIS — J441 Chronic obstructive pulmonary disease with (acute) exacerbation: Secondary | ICD-10-CM | POA: Diagnosis not present

## 2024-07-20 LAB — BASIC METABOLIC PANEL WITH GFR
Anion gap: 7 (ref 5–15)
BUN: 52 mg/dL — ABNORMAL HIGH (ref 8–23)
CO2: 27 mmol/L (ref 22–32)
Calcium: 8.2 mg/dL — ABNORMAL LOW (ref 8.9–10.3)
Chloride: 105 mmol/L (ref 98–111)
Creatinine, Ser: 2.69 mg/dL — ABNORMAL HIGH (ref 0.61–1.24)
GFR, Estimated: 24 mL/min — ABNORMAL LOW
Glucose, Bld: 134 mg/dL — ABNORMAL HIGH (ref 70–99)
Potassium: 4.7 mmol/L (ref 3.5–5.1)
Sodium: 139 mmol/L (ref 135–145)

## 2024-07-20 LAB — GLUCOSE, CAPILLARY
Glucose-Capillary: 133 mg/dL — ABNORMAL HIGH (ref 70–99)
Glucose-Capillary: 141 mg/dL — ABNORMAL HIGH (ref 70–99)
Glucose-Capillary: 93 mg/dL (ref 70–99)
Glucose-Capillary: 99 mg/dL (ref 70–99)
Glucose-Capillary: 250 mg/dL — ABNORMAL HIGH (ref 70–99)

## 2024-07-20 MED ORDER — PREDNISONE 50 MG PO TABS
50.0000 mg | ORAL_TABLET | Freq: Once | ORAL | Status: AC
  Administered 2024-07-20: 50 mg via ORAL
  Filled 2024-07-20: qty 1

## 2024-07-20 MED ORDER — IPRATROPIUM-ALBUTEROL 0.5-2.5 (3) MG/3ML IN SOLN
3.0000 mL | Freq: Two times a day (BID) | RESPIRATORY_TRACT | Status: DC
  Administered 2024-07-21 – 2024-07-22 (×3): 3 mL via RESPIRATORY_TRACT
  Filled 2024-07-20 (×3): qty 3

## 2024-07-20 MED ORDER — PREDNISONE 20 MG PO TABS
40.0000 mg | ORAL_TABLET | Freq: Every day | ORAL | Status: DC
  Administered 2024-07-21: 40 mg via ORAL
  Filled 2024-07-20: qty 2

## 2024-07-20 MED ORDER — PANTOPRAZOLE SODIUM 40 MG PO TBEC
40.0000 mg | DELAYED_RELEASE_TABLET | Freq: Every day | ORAL | Status: DC
  Administered 2024-07-20 – 2024-07-22 (×3): 40 mg via ORAL
  Filled 2024-07-20 (×3): qty 1

## 2024-07-20 NOTE — Progress Notes (Signed)
 Physical Therapy Treatment Patient Details Name: Martin Jennings MRN: [id number] DOB: [birth year] Today's Date: 07/20/2024   History of Present Illness Pt is a 73 yo M who presented to ER secondary to progressive SOB, cough; admitted for management of acute on chronic hypoxemic respiratory failure, COPD exacerbation, and acute/chronic CHF exacerbation.  Hospital course significant for R thoracentesis (12/22) with removal of fluid.    PT Comments  Pt was pleasant and motivated to participate during the session and put forth good effort throughout but ultimately was limited by L foot pain with WB that pt attributed to his chronic gout, nsg/MD notified.  Pt education and practice provided on using step-to sequencing with the RW to compensate for L foot pain with pt able to perform two short bouts of ambulation with this technique but remained limited overall.  Pt was steady throughout the session with no overt LOB and with SpO2 and HR WNL on supplemental O2 as found in room.  Pt will benefit from continued PT services upon discharge to safely address deficits listed in patient problem list for decreased caregiver assistance and eventual return to PLOF.        If plan is discharge home, recommend the following: A little help with walking and/or transfers;A little help with bathing/dressing/bathroom;Assist for transportation;Help with stairs or ramp for entrance   Can travel by private vehicle        Equipment Recommendations  Rolling walker (2 wheels) (RW if pt remains limited by L foot pain at discharge)    Recommendations for Other Services       Precautions / Restrictions Precautions Precautions: Fall Restrictions Weight Bearing Restrictions Per Provider Order: No     Mobility  Bed Mobility Overal bed mobility: Modified Independent             General bed mobility comments: Min extra time and effort only    Transfers Overall transfer level: Needs assistance    Transfers: Sit to/from Stand Sit to Stand: Supervision           General transfer comment: Good eccentric and concentric control and stability from various height surfaces    Ambulation/Gait Ambulation/Gait assistance: Contact guard assist Gait Distance (Feet): 8 Feet Assistive device: Rolling walker (2 wheels) Gait Pattern/deviations: Step-to pattern, Antalgic, Decreased step length - right, Decreased stance time - left Gait velocity: decreased     General Gait Details: Pt reported gout pain with WB on the L foot with step-to sequencing education provided to compensate for pain and allow amb functional distances for toileting; pt able to amb 2 x 8 feet near EOB with fair carryover of proper sequencing   Stairs             Wheelchair Mobility     Tilt Bed    Modified Rankin (Stroke Patients Only)       Balance Overall balance assessment: Needs assistance Sitting-balance support: No upper extremity supported, Feet supported Sitting balance-Leahy Scale: Normal     Standing balance support: Bilateral upper extremity supported, During functional activity, Reliant on assistive device for balance Standing balance-Leahy Scale: Good                              Communication Communication Communication: No apparent difficulties  Cognition Arousal: Alert Behavior During Therapy: WFL for tasks assessed/performed   PT - Cognitive impairments: No apparent impairments  Following commands: Intact      Cueing Cueing Techniques: Verbal cues, Visual cues  Exercises      General Comments        Pertinent Vitals/Pain Pain Assessment Pain Assessment: No/denies pain (No pain at rest; L foot 8/10 pain with weight bearing)    Home Living                          Prior Function            PT Goals (current goals can now be found in the care plan section) Progress towards PT goals: PT to reassess next  treatment    Frequency    Min 2X/week      PT Plan      Co-evaluation              AM-PAC PT 6 Clicks Mobility   Outcome Measure  Help needed turning from your back to your side while in a flat bed without using bedrails?: None Help needed moving from lying on your back to sitting on the side of a flat bed without using bedrails?: None Help needed moving to and from a bed to a chair (including a wheelchair)?: A Little Help needed standing up from a chair using your arms (e.g., wheelchair or bedside chair)?: A Little Help needed to walk in hospital room?: A Little Help needed climbing 3-5 steps with a railing? : A Little 6 Click Score: 20    End of Session Equipment Utilized During Treatment: Gait belt;Oxygen  Activity Tolerance: Patient tolerated treatment well Patient left: Other (comment);with call bell/phone within reach (Pt left on [facility] with call bell in reach for BM at end of session, nursing notified) Nurse Communication: Mobility status PT Visit Diagnosis: Difficulty in walking, not elsewhere classified (R26.2);Pain Pain - Right/Left: Left Pain - part of body: Ankle and joints of foot     Time: 8964-8947 PT Time Calculation (min) (ACUTE ONLY): 17 min  Charges:    $Gait Training: 8-22 mins PT General Charges $$ ACUTE PT VISIT: 1 Visit                     D. Scott Julieann Drummonds PT, DPT 07/20/2024, 1:31 PM

## 2024-07-20 NOTE — Progress Notes (Signed)
 "    PULMONOLOGY         Date: 07/20/2024,   MRN# [id number] Martin Jennings 07-08-1951     AdmissionWeight: 82.9 kg                 CurrentWeight: 77.1 kg  Referring provider: Dr Lanetta   CHIEF COMPLAINT:   Acute on chronic hypoxemic respiratory failure   HISTORY OF PRESENT ILLNESS   Martin Jennings is a 73 y.o. male with medical history significant for COPD, type 2 diabetes mellitus, PVD, IBD and bronchiectasis, as well as lung cancer, essential hypertension, dyslipidemia, and gout, who presented to the emergency room with acute onset of worsening dyspnea over the last week with associated cough with excessive clear mucus production.  He has been having lightheadedness when attempting to walk to the bathroom.  He admitted to chills without reported fever.  The patient admitted to orthopnea and paroxysmal nocturnal dyspnea as well as dyspnea on exertion with lower extremity edema.  No fever or chills.  No nausea or vomiting or abdominal pain.  No dysuria, oliguria or hematuria or flank pain.  He denied any recent sick contacts.  He is at 1 L of O2 by nasal cannula at baseline.  Off of oxygen  he was satting in the 81s with EMS required 4 L of O2 by nasal cannula to bring his sats to the low 90s upon arrival to the ED.  No chest pain or palpitations. He had chest imaging done with findings of mod right pleural effusion on right via CT chest. There is associated compressive atelectasis. There are also fibrotic changes on right lung consistent with post treatment changes. PCCM consultation placed for additional evaluation and management of complex right lung process in patient with lung cancer and copd.   07/13/24- no overnight events. Patient has not had throacentesis done yet. He is diuresing well now appx 3L net negative. Ive sent a message to Dr Jenna with IR via Epic secure chat to notify regarding thoracentesis order. Patient is on 9L/min [place].  Martin Jennings is on IV doxy and rocephin  for  CAP tx empirically. Reduced prednisone  to 35mg  PO daily   07/14/24- patient seen at bedside.  He is up awake and alert to person place and time. He was unable to have thoracentesis done due to desaturation. He was noted to have hypercapnic respiratory failure on ABG and received BIPAP ventilation.  He was mildly acidemic on this abg.  Will repeat VBG tonight. He should continue the BIPAP at bedtime for now. I have narrowed his antibiotics to augmetin PO, and reduced prednisone  to 30mg .  He is eating well without episodes of aspiration.   07/15/24- patient on 7L/min.  He is coughing with expectoration of clearish phlegm.  He did not get thoracentesis still.  Im repeating VBG and CXR today to evaluate interval changes on pleural effusion. He is net negative 2L but has renal failure and it would benefit him getting the fluid drained.  Im ordering mucomyst  today TID with resp therapist to help him expetorate some of the inspissated mucus.   07/16/24- patient s/p thoracentesis. Post removal of 900 cc straw colored plural fluid from right side the chest  x ray appears markedly improved. Plan to continue chest physiotherapy and wean O2 to optimize for dc. Repeating ABG tonight.   07/17/24- patient seen at bedside. He is in no distress.  Has fine crackles at bases bilaterally , suspect atelectatic changes. Will repeat CXR today and  initiate metaneb with albuterol  .   07/18/24-  patient on 4L/min, not confused this morning. Renal function and BUN have improved. CXR with improved opacification bilaterally. Patient was able to walk around hallway, states he feels a little weak.   07/19/24- patient seen at bedside.  He is on home setting of oxygen  requirement. At this point he may need BIPAP at home.  I have ordered this today and can follow up with him.   07/20/24- patient seen at bedside.  He is doing well and is breathing better.  He is slowly getting to baseline.  Now he is weaned to 2L/min [place].  I can  follow up with him on outpatient basis.    PAST MEDICAL HISTORY   Past Medical History:  Diagnosis Date   Acute respiratory failure with hypoxia (HCC) 07/29/2022   AKI (acute kidney injury) 04/04/2020   Bladder cancer (HCC) 2012   CAP (community acquired pneumonia) 04/04/2020   COPD suggested by initial evaluation 10/11/2022   COVID-19 virus infection 04/03/2020   Goals of care, counseling/discussion 05/19/2020   Hypertension    Influenza A with pneumonia 07/29/2022   Leg pain 01/25/2017   Lung cancer (HCC)    Malignant neoplasm of bladder, unspecified (HCC) 12/17/2010   Peripheral vascular disease    Sepsis (HCC) 07/29/2022     SURGICAL HISTORY   Past Surgical History:  Procedure Laterality Date   BLADDER REMOVAL     LOWER EXTREMITY ANGIOGRAPHY Left 04/12/2017   Procedure: Lower Extremity Angiography;  Surgeon: Jama Cordella MATSU, MD;  Location: [facility] INVASIVE CV [facility];  Service: Cardiovascular;  Laterality: Left;   PORTA CATH INSERTION N/A 05/22/2020   Procedure: PORTA CATH INSERTION;  Surgeon: Marea Selinda RAMAN, MD;  Location: [facility] INV[facility] CV [facility];  Service: Cardiovascular;  Laterality: N/A;   PORTA CATH REMOVAL N/A 11/15/2022   Procedure: PORTA CATH REMOVAL;  Surgeon: Marea Selinda RAMAN, MD;  Location: [facility] INV[facility] CV [facility];  Service: Cardiovascular;  Laterality: N/A;   uretostomy     VASCULAR SURGERY       FAMILY HISTORY   History reviewed. No pertinent family history.   SOCIAL HISTORY   Social History[1]   MEDICATIONS    Home Medication:  Current Outpatient Rx   Order #: [id number] Class: Normal    Current Medication: Current Medications[2]    ALLERGIES   Patient has no known allergies.     REVIEW OF SYSTEMS    Review of Systems:  Gen:  Denies  fever, sweats, chills weigh loss  HEENT: Denies blurred vision, double vision, ear pain, eye pain, hearing loss, nose bleeds, sore throat Cardiac:  No dizziness, chest pain or heaviness, chest  tightness,edema Resp:   reports dyspnea chronically  Gi: Denies swallowing difficulty, stomach pain, nausea or vomiting, diarrhea, constipation, bowel incontinence Gu:  Denies bladder incontinence, burning urine Ext:   Denies Joint pain, stiffness or swelling Skin: Denies  skin rash, easy bruising or bleeding or hives Endoc:  Denies polyuria, polydipsia , polyphagia or weight change Psych:   Denies depression, insomnia or hallucinations   Other:  All other systems negative   VS: BP 122/67 (BP Location: Left Arm)   Pulse 85   Temp 97.8 F (36.6 C)   Resp 16   Ht 5' 11 (1.803 m)   Wt 77.1 kg   SpO2 93%   BMI 23.71 kg/m      PHYSICAL EXAM    GENERAL:NAD, no fevers, chills, no weakness no fatigue HEAD: Normocephalic, atraumatic.  EYES:  Pupils equal, round, reactive to light. Extraocular muscles intact. No scleral icterus.  MOUTH: Moist mucosal membrane. Dentition intact. No abscess noted.  EAR, NOSE, THROAT: Clear without exudates. No external lesions.  NECK: Supple. No thyromegaly. No nodules. No JVD.  PULMONARY: decreased breath sounds with mild rhonchi worse at bases bilaterally.  CARDIOVASCULAR: S1 and S2. Regular rate and rhythm. No murmurs, rubs, or gallops. No edema. Pedal pulses 2+ bilaterally.  GASTROINTESTINAL: Soft, nontender, nondistended. No masses. Positive bowel sounds. No hepatosplenomegaly.  MUSCULOSKELETAL: No swelling, clubbing, or edema. Range of motion full in all extremities.  NEUROLOGIC: Cranial nerves II through XII are intact. No gross focal neurological deficits. Sensation intact. Reflexes intact.  SKIN: No ulceration, lesions, rashes, or cyanosis. Skin warm and dry. Turgor intact.  PSYCHIATRIC: Mood, affect within normal limits. The patient is awake, alert and oriented x 3. Insight, judgment intact.       IMAGING   Narrative & Impression  CLINICAL DATA:  Concern for pneumonia.   EXAM: CT CHEST WITHOUT CONTRAST   TECHNIQUE: Multidetector CT  imaging of the chest was performed following the standard protocol without IV contrast.   RADIATION DOSE REDUCTION: This exam was performed according to the departmental dose-optimization program which includes automated exposure control, adjustment of the mA and/or kV according to patient size and/or use of iterative reconstruction technique.   COMPARISON:  Chest radiograph dated 07/10/2024 and CT dated 09/10/2022.   FINDINGS: Evaluation of this exam is limited in the absence of intravenous contrast.   Cardiovascular: There is mild cardiomegaly. No pericardial effusion. Advanced 3 vessel coronary vascular calcification. Mild atherosclerotic calcification of the thoracic aorta. No aneurysmal dilatation. There is dilatation of the main pulmonary trunk suggestive of pulmonary hypertension.   Mediastinum/Nodes: No obvious hilar adenopathy. Evaluation however is limited due to consolidative changes of the lungs. The esophagus is grossly unremarkable no mediastinal fluid collection.   Lungs/Pleura: Moderate right pleural effusion. There is partial compressive atelectasis of the right lower lobe versus pneumonia. Background of emphysema and diffuse interstitial coarsening. Progression of bandlike consolidation extending from the right hilum into the anterior right upper lobe since the prior CT likely representing post treatment fibrosis. Pneumonia is not excluded. Additional area of consolidation in the lateral right middle lobe also concerning for pneumonia. No pneumothorax. The central airways are patent.   Upper Abdomen: No acute abnormality.   Musculoskeletal: No acute osseous pathology.   IMPRESSION: 1. Moderate right pleural effusion with partial compressive atelectasis of the right lower lobe versus pneumonia. 2. Progression of bandlike consolidation extending from the right hilum into the anterior right upper lobe since the prior CT likely representing post treatment  fibrosis. Pneumonia is not excluded. 3. Additional area of consolidation in the lateral right middle lobe also concerning for pneumonia. 4. Mild cardiomegaly with advanced 3 vessel coronary vascular calcification. 5. Aortic Atherosclerosis (ICD10-I70.0) and Emphysema (ICD10-J43.9).     Electronically Signed   By: Vanetta Chou M.D.   On: 07/11/2024 16:20    ASSESSMENT/PLAN   Acute on chronic hypoxemic respiratory failure   Multifactorial in etiology including possible pneumonia of right lung, Cancer of right lung, moderate pleural effusion and atelectasis of right lower lobe.   - will target each underlying cause for improvement in oxygenation    Moderate right pleural effusion     - IR consult -s/p thoracentesis   COPD with acute exacerbation      - agree with current COPD carepath     - patient is now  close to baseline and is on augmentin  now with mucomyst     - PT/OT chest PT    - nebulizer therapy with duoneb and inhaler therapy with Breo ellipta    Compressive atelectasis  Patient weak and unable to use incentive spirometry very well,  I have ordered metaneb therapy with respiratory therapist PT/OT     Physical deconditioning  - [facility]n would benefit from [facility]        Thank you for allowing me to participate in the care of this patient.   Patient/Family are satisfied with care plan and all questions have been answered.    Provider disclosure: Patient with at least one acute or chronic illness or injury that poses a threat to life or bodily function and is being managed actively during this encounter.  All of the below services have been performed independently by signing provider:  review of prior documentation from internal and or external health records.  Review of previous and current lab results.  Interview and comprehensive assessment during patient visit today. Review of current and previous chest radiographs/CT scans.  Discussion of management and test interpretation with health care team and patient/family.   This document was prepared using Dragon voice recognition software and may include unintentional dictation errors.     Arshi Duarte, M.D.  Division of Pulmonary & Critical Care Medicine                     [1]  Social History Tobacco Use   Smoking status: Former    Current packs/day: 0.00    Average packs/day: 0.5 packs/day for 50.0 years (25.0 ttl pk-yrs)    Types: Cigarettes    Quit date: 08/2023    Years since quitting: 0.8   Smokeless tobacco: Never   Tobacco comments:    Per pt. He smokes 1 pack every 3 days  Vaping Use   Vaping status: Never Used  Substance Use Topics   Alcohol use: No   Drug use: Not Currently    Types: Marijuana  [2]  Current Facility-Administered Medications:    acetaminophen  (TYLENOL ) tablet 650 mg, 650 mg, Oral, Q6H PRN **OR** acetaminophen  (TYLENOL ) suppository 650 mg, 650 mg, Rectal, Q6H PRN, Mansy, Jan A, MD   acetylcysteine  (MUCOMYST ) 20 % nebulizer / oral solution 4 mL, 4 mL, Nebulization, TID, Saba Neuman, MD, 4 mL at 07/20/24 0950   allopurinol  (ZYLOPRIM ) tablet 100 mg, 100 mg, Oral, BID, Mansy, Jan A, MD, 100 mg at 07/20/24 9065   amLODipine  (NORVASC ) tablet 10 mg, 10 mg, Oral, Daily, Mansy, Jan A, MD, 10 mg at 07/20/24 9065   atorvastatin  (LIPITOR) tablet 20 mg, 20 mg, Oral, QPM, Mansy, Jan A, MD, 20 mg at 07/19/24 1644   chlorpheniramine-HYDROcodone  (TUSSIONEX) 10-8 MG/5ML suspension 5 mL, 5 mL, Oral, Q12H PRN, Mansy, Jan A, MD, 5 mL at 07/13/24 1120   clopidogrel  (PLAVIX ) tablet 75 mg, 75 mg, Oral, Daily, Mansy, Jan A, MD, 75 mg at 07/20/24 9062   empagliflozin  (JARDIANCE ) tablet 10 mg, 10 mg, Oral, Daily, Mansy, Jan A, MD, 10 mg at 07/20/24 0935   enoxaparin  (LOVENOX ) injection 30 mg, 30 mg, Subcutaneous, Q24H, Mansy, Jan A, MD, 30 mg at 07/19/24 2126   fluticasone  (FLONASE ) 50 MCG/ACT nasal spray 1 spray, 1 spray, Each Nare,  Daily, Ponnala, Shruthi, MD, 1 spray at 07/19/24 1830   fluticasone  furoate-vilanterol (BREO ELLIPTA ) 200-25 MCG/ACT 1 puff, 1 puff, Inhalation, Daily, Agbata, Tochukwu, MD, 1 puff at 07/20/24 [phone]  guaiFENesin  (MUCINEX ) 12 hr tablet 600 mg, 600 mg, Oral, BID, Mansy, Jan A, MD, 600 mg at 07/20/24 9065   insulin  aspart (novoLOG ) injection 0-20 Units, 0-20 Units, Subcutaneous, TID WC, Mansy, Madison LABOR, MD, 3 Units at 07/20/24 9066   ipratropium-albuterol  (DUONEB) 0.5-2.5 (3) MG/3ML nebulizer solution 3 mL, 3 mL, Nebulization, Q4H PRN, Mansy, Jan A, MD   ipratropium-albuterol  (DUONEB) 0.5-2.5 (3) MG/3ML nebulizer solution 3 mL, 3 mL, Nebulization, TID, Agbata, Tochukwu, MD, 3 mL at 07/20/24 0950   loratadine  (CLARITIN ) tablet 10 mg, 10 mg, Oral, Daily, Mansy, Jan A, MD, 10 mg at 07/20/24 9063   magnesium  hydroxide (MILK OF MAGNESIA) suspension 30 mL, 30 mL, Oral, Daily PRN, Mansy, Jan A, MD, 30 mL at 07/16/24 1839   ondansetron  (ZOFRAN ) tablet 4 mg, 4 mg, Oral, Q6H PRN **OR** ondansetron  (ZOFRAN ) injection 4 mg, 4 mg, Intravenous, Q6H PRN, Mansy, Jan A, MD   polyethylene glycol (MIRALAX  / GLYCOLAX ) packet 17 g, 17 g, Oral, Daily, Agbata, Tochukwu, MD, 17 g at 07/20/24 0935   senna-docusate (Senokot-S) tablet 2 tablet, 2 tablet, Oral, QHS, Agbata, Tochukwu, MD, 2 tablet at 07/19/24 2126   sodium zirconium cyclosilicate  (LOKELMA ) packet 10 g, 10 g, Oral, Daily, Ponnala, Shruthi, MD, 10 g at 07/20/24 9063   traZODone  (DESYREL ) tablet 25 mg, 25 mg, Oral, QHS PRN, Mansy, Jan A, MD, 25 mg at 07/20/24 0200  Facility-Administered Medications Ordered in Other Encounters:    sodium chloride  flush (NS) 0.9 % injection 10 mL, 10 mL, Intravenous, PRN, Melanee Annah BROCKS, MD, 10 mL at 09/18/20 0850  "

## 2024-07-20 NOTE — TOC Progression Note (Addendum)
 Transition of Care [facility]) - Progression Note    Patient Details  Name: Martin Jennings MRN: [id number] Date of Birth: May 21, 1951  [facility] CM/SW Contact  Shasta DELENA Daring, RN Phone Number: 07/20/2024, 12:37 PM  Clinical Narrative:     PT accepted HH offer from [facility]. Notified agency and linked in hub,                    Expected Discharge Plan and Services                                               Social Drivers of Health (SDOH) Interventions SDOH Screenings   Food Insecurity: No Food Insecurity (07/10/2024)  Housing: Low Risk (07/10/2024)  Transportation Needs: No Transportation Needs (07/10/2024)  Utilities: Not At Risk (07/10/2024)  Depression (PHQ2-9): Medium Risk (07/03/2024)  Social Connections: Moderately Isolated (07/10/2024)  Stress: No Stress Concern Present (07/03/2024)  Tobacco Use: Medium Risk (07/10/2024)    Readmission Risk Interventions     No data to display

## 2024-07-20 NOTE — Progress Notes (Signed)
 " Progress Note   Patient: Martin Jennings FMW:[id number] DOB: July 04, 1951 DOA: 07/10/2024     10 DOS: the patient was seen and examined on 07/20/2024   Brief hospital course:  Martin Jennings is a 73 y.o. male with medical history significant for COPD, type 2 diabetes mellitus, PVD, IBD and bronchiectasis, as well as lung cancer, essential hypertension, dyslipidemia, and gout, who presented to the emergency room with acute onset of worsening dyspnea over the last week with associated cough with excessive clear mucus production.  He has been having lightheadedness when attempting to walk to the bathroom.  Was admitted for acute on chronic hypoxic and hypercapnic respiratory failure, acute COPD exacerbation.  On presentation was on BiPAP, required HFNC and now on 4 L [place].  Hospital course complicated by AKI on CKD which improved, found to have moderate right-sided pleural effusion s/p thoracentesis.  Hospital course as below   Assessment and Plan: Acute on chronic respiratory failure with hypoxia and hypercapnia Multifactorial including pneumonia right lung, cancer right lung, moderate right pleural effusion, atelectasis of right lower lobe Oxygen  requirements have significantly improved was on BiPAP/HFNC, now at 4 L [place], At baseline patient wears 2 L of oxygen  continuous wean oxygen  as able May need Bipap at home, Pulmonary following, appreciate recs  Acute COPD exacerbation Atelectasis Was lethargic, placed on noninvasive ventilation, now improved Empiric antibiotics on Augmentin  to cover for possible pneumonia Continue inhaled steroids, bronchodilator therapy Completed steroids Has mild hemoptysis, repeat sputum culture Appreciate pulmonary input  Moderate right-sided pleural effusion S/p thoracentesis 12/22, 900 mL pleural fluid drained Body fluid culture showed no growth in less than 24 hours and appears to be a transudate   Chronic diastolic dysfunction CHF HTN Does not appear to be  acutely exacerbated at this time Last known LVEF of 60 - 65% from a 2D echocardiogram which was done 12/18 Lasix  was held due to AKI Continue amlodipine , Jardiance   AKI superimposed on stage IV chronic kidney disease  Baseline serum creatinine ~ 2.7-1.9 , serum creatinine peaked up to 3.25, and slowly improved to 2.69 Avoid nephrotoxic agents Monitor creatinine Nephrology referral at discharge  Hyperkalemia On Lokelma  daily   Diabetes mellitus with complications of stage IV chronic kidney disease Blood sugars are stable Continue sliding scale insulin   History of lung cancer History of stage III adenocarcinoma of the lung status post chemo/radiation as well as maintenance durvalumab  Currently in remission Follow-up with oncology for surveillance  History of coronary artery disease Elevated troponin Troponin is flat and is most likely secondary to demand ischemia from tachypnea and respiratory distress Echocardiogram does not show any regional wall motion abnormalities Continue Plavix  and atorvastatin   Acute gout flare Left lower extremity, started on prednisone  Continue allopurinol   Physical Deconditioning Appreciate PT evaluation Recommend home health upon discharge    Subjective: Patient is seen and examined at the bedside.  Reports feeling better today Has gout Flare left lower extremity, unable to walk, started on prednisone  Anticipate discharge tomorrow  Physical Exam: Vitals:   07/20/24 1415 07/20/24 1722 07/20/24 1737 07/20/24 1816  BP:  (!) 150/73 (!) 154/75 (!) 154/78  Pulse:  80 81 80  Resp:   16 20  Temp:  97.9 F (36.6 C) 97.8 F (36.6 C) 98.2 F (36.8 C)  TempSrc:  Oral    SpO2: 95% 94% (!) 88% 93%  Weight:      Height:       GENERAL:  73 y.o.-year-old Caucasian male patient lying in  the bed NECK:  Supple, no jugular venous distention. No thyroid  enlargement, no tenderness.  LUNGS: Bilateral air entry, chronic dyspnea CARDIOVASCULAR: Regular  rate and rhythm, S1, S2 normal. No murmurs, rubs, or gallops.  ABDOMEN: Soft, nondistended, nontender. Bowel sounds present. No organomegaly or mass.  EXTREMITIES: No pedal edema NEUROLOGIC: Cranial nerves II through XII are intact. Muscle strength 5/5 in all extremities. Sensation intact. Gait not checked.  PSYCHIATRIC: The patient is alert and oriented x 3.  Normal affect and good eye contact. SKIN: No obvious rash, lesion, or ulcer.     Data Reviewed: Labs reviewed  Family Communication: Plan of care was discussed with patient's wife over the phone. All questions and concerns have been addressed  Disposition: Status is: Inpatient Remains inpatient appropriate because: Discharge planning  Planned Discharge Destination: Home with Home Health    Time spent: 35 minutes  Author: Laree Lock, MD 07/20/2024 7:18 PM  For on call review [email or web address]  "

## 2024-07-21 DIAGNOSIS — J441 Chronic obstructive pulmonary disease with (acute) exacerbation: Secondary | ICD-10-CM | POA: Diagnosis not present

## 2024-07-21 LAB — BASIC METABOLIC PANEL WITH GFR
Anion gap: 7 (ref 5–15)
Anion gap: 9 (ref 5–15)
BUN: 48 mg/dL — ABNORMAL HIGH (ref 8–23)
BUN: 46 mg/dL — ABNORMAL HIGH (ref 8–23)
CO2: 26 mmol/L (ref 22–32)
CO2: 26 mmol/L (ref 22–32)
Calcium: 8.5 mg/dL — ABNORMAL LOW (ref 8.9–10.3)
Calcium: 8.4 mg/dL — ABNORMAL LOW (ref 8.9–10.3)
Chloride: 106 mmol/L (ref 98–111)
Chloride: 105 mmol/L (ref 98–111)
Creatinine, Ser: 2.7 mg/dL — ABNORMAL HIGH (ref 0.61–1.24)
Creatinine, Ser: 2.66 mg/dL — ABNORMAL HIGH (ref 0.61–1.24)
GFR, Estimated: 24 mL/min — ABNORMAL LOW
GFR, Estimated: 25 mL/min — ABNORMAL LOW
Glucose, Bld: 169 mg/dL — ABNORMAL HIGH (ref 70–99)
Glucose, Bld: 152 mg/dL — ABNORMAL HIGH (ref 70–99)
Potassium: 5.6 mmol/L — ABNORMAL HIGH (ref 3.5–5.1)
Potassium: 5.2 mmol/L — ABNORMAL HIGH (ref 3.5–5.1)
Sodium: 139 mmol/L (ref 135–145)
Sodium: 140 mmol/L (ref 135–145)

## 2024-07-21 LAB — GLUCOSE, CAPILLARY
Glucose-Capillary: 188 mg/dL — ABNORMAL HIGH (ref 70–99)
Glucose-Capillary: 211 mg/dL — ABNORMAL HIGH (ref 70–99)
Glucose-Capillary: 177 mg/dL — ABNORMAL HIGH (ref 70–99)
Glucose-Capillary: 204 mg/dL — ABNORMAL HIGH (ref 70–99)

## 2024-07-21 MED ORDER — SODIUM ZIRCONIUM CYCLOSILICATE 10 G PO PACK
10.0000 g | PACK | Freq: Once | ORAL | Status: AC
  Administered 2024-07-21: 10 g via ORAL
  Filled 2024-07-21: qty 1

## 2024-07-21 MED ORDER — PREDNISONE 20 MG PO TABS
30.0000 mg | ORAL_TABLET | Freq: Every day | ORAL | Status: DC
  Administered 2024-07-22: 30 mg via ORAL
  Filled 2024-07-21: qty 1

## 2024-07-21 NOTE — Plan of Care (Signed)

## 2024-07-21 NOTE — Progress Notes (Signed)
 " Progress Note   Patient: Martin Jennings FMW:[id number] DOB: 02/09/1951 DOA: 07/10/2024     11 DOS: the patient was seen and examined on 07/21/2024   Brief hospital course:  Martin Jennings is a 73 y.o. male with medical history significant for COPD, type 2 diabetes mellitus, PVD, IBD and bronchiectasis, as well as lung cancer, essential hypertension, dyslipidemia, and gout, who presented to the emergency room with acute onset of worsening dyspnea over the last week with associated cough with excessive clear mucus production.  He has been having lightheadedness when attempting to walk to the bathroom.  Was admitted for acute on chronic hypoxic and hypercapnic respiratory failure, acute COPD exacerbation.  On presentation was on BiPAP, required HFNC and now on 4 L [place].  Hospital course complicated by AKI on CKD which improved, found to have moderate right-sided pleural effusion s/p thoracentesis.  Hospital course as below   Assessment and Plan: Acute on chronic respiratory failure with hypoxia and hypercapnia Multifactorial including pneumonia right lung, cancer right lung, moderate right pleural effusion, atelectasis of right lower lobe Oxygen  requirements have significantly improved was on BiPAP/HFNC, now at 3 L [place], At baseline patient wears 2 L of oxygen  continuous wean oxygen  as able May need Bipap at home, Pulmonary following, appreciate recs  Acute COPD exacerbation Atelectasis Was lethargic, placed on noninvasive ventilation, now improved Empiric antibiotics on Augmentin  to cover for possible pneumonia Continue inhaled steroids, bronchodilator therapy Completed steroids Reported mild hemoptysis which resolved, repeat sputum culture Appreciate pulmonary input  Moderate right-sided pleural effusion S/p thoracentesis 12/22, 900 mL pleural fluid drained Body fluid culture showed NGTD and appears to be a transudate   Chronic diastolic dysfunction CHF HTN Does not appear to be acutely  exacerbated at this time Last known LVEF of 60 - 65% from a 2D echocardiogram which was done 12/18 Lasix  was held due to AKI Continue amlodipine , Jardiance   AKI superimposed on stage IV chronic kidney disease  Baseline serum creatinine ~ 2.7-1.9 , serum creatinine peaked up to 3.25, and slowly improved to 2.70 Avoid nephrotoxic agents Monitor creatinine Nephrology consult for further recs  Hyperkalemia On Lokelma  daily, potassium creeping up to 5.6 Repeat BMP later today   Diabetes mellitus with complications of stage IV chronic kidney disease Blood sugars are stable Continue sliding scale insulin   History of lung cancer History of stage III adenocarcinoma of the lung status post chemo/radiation as well as maintenance durvalumab  Currently in remission Follow-up with oncology for surveillance  History of coronary artery disease Elevated troponin Troponin is flat and is most likely secondary to demand ischemia from tachypnea and respiratory distress Echocardiogram does not show any regional wall motion abnormalities Continue Plavix  and atorvastatin   Acute gout flare Left lower extremity, Prednisone  taper Continue allopurinol   Physical Deconditioning Appreciate PT evaluation Recommend home health upon discharge    Subjective: Patient is seen and examined at the bedside.  Reports feeling better today Left foot gout flare has improved Potassium continues to trend high inspite of being on daily Lokelma , nephrology consulted for further recs  Physical Exam: Vitals:   07/21/24 0446 07/21/24 0447 07/21/24 0756 07/21/24 0816  BP: 126/62  137/80   Pulse: 82  81   Resp: 16  19   Temp: 97.6 F (36.4 C)  98.4 F (36.9 C)   TempSrc: Oral  Oral   SpO2: 95%  96% 93%  Weight:  78.3 kg    Height:       GENERAL:  73  y.o.-year-old Caucasian male patient lying in the bed NECK:  Supple, no jugular venous distention. No thyroid  enlargement, no tenderness.  LUNGS: Bilateral air  entry, chronic dyspnea CARDIOVASCULAR: Regular rate and rhythm, S1, S2 normal. No murmurs, rubs, or gallops.  ABDOMEN: Soft, nondistended, nontender. Bowel sounds present. No organomegaly or mass.  EXTREMITIES: No pedal edema NEUROLOGIC: Cranial nerves II through XII are intact. Muscle strength 5/5 in all extremities. Sensation intact. Gait not checked.  PSYCHIATRIC: The patient is alert and oriented x 3.  Normal affect and good eye contact. SKIN: No obvious rash, lesion, or ulcer.     Data Reviewed: Labs reviewed  Family Communication: Plan of care was discussed with patient's wife over the phone. All questions and concerns have been addressed  Disposition: Status is: Inpatient Remains inpatient appropriate because: Discharge planning  Planned Discharge Destination: Home with Home Health    Time spent: 55 minutes  Author: Laree Lock, MD 07/21/2024 2:13 PM  For on call review [email or web address]  "

## 2024-07-21 NOTE — Progress Notes (Signed)
 Mobility Specialist - Progress Note  Pre-mobility:  SpO2-89% During mobility: , SpO2-90% Post-mobility: SPO2-85%  recovered to 89% within 2 min   07/21/24 1100  Mobility  Activity Ambulated with assistance;Stood at bedside;Dangled on edge of bed  Level of Assistance Contact guard assist, steadying assist  Assistive Device Front wheel walker  Distance Ambulated (ft) 100 ft  Range of Motion/Exercises Active  Activity Response Tolerated well  Mobility visit 1 Mobility  Mobility Specialist Start Time (ACUTE ONLY) [id number]  Mobility Specialist Stop Time (ACUTE ONLY) 1015  Mobility Specialist Time Calculation (min) (ACUTE ONLY) 16 min   Pt was supine in bed on O2 @ 3 L upon entry. Pt agreed to mobility. Pt is able today to get to the EOB independently with bed features. Pt is able to STS with minA CGA and a 2 [employer]. Pt O2 vitals were taken throughout activity as a precaution. Pt ambulated well. Pt did use a recovery break to check vitals. Pt did Desat and utilized PBT. After activity pt returned to the room, back in bed with needs in reach and bed alarm on.  Clem Rodes Mobility Specialist 07/21/2024, 12:03 PM

## 2024-07-21 NOTE — Progress Notes (Signed)
 "    PULMONOLOGY         Date: 07/21/2024,   MRN# [id number] Martin Jennings September 06, 1950     AdmissionWeight: 82.9 kg                 CurrentWeight: 78.3 kg  Referring provider: Dr Lanetta   CHIEF COMPLAINT:   Acute on chronic hypoxemic respiratory failure   HISTORY OF PRESENT ILLNESS   Martin Jennings is a 73 y.o. male with medical history significant for COPD, type 2 diabetes mellitus, PVD, IBD and bronchiectasis, as well as lung cancer, essential hypertension, dyslipidemia, and gout, who presented to the emergency room with acute onset of worsening dyspnea over the last week with associated cough with excessive clear mucus production.  He has been having lightheadedness when attempting to walk to the bathroom.  He admitted to chills without reported fever.  The patient admitted to orthopnea and paroxysmal nocturnal dyspnea as well as dyspnea on exertion with lower extremity edema.  No fever or chills.  No nausea or vomiting or abdominal pain.  No dysuria, oliguria or hematuria or flank pain.  He denied any recent sick contacts.  He is at 1 L of O2 by nasal cannula at baseline.  Off of oxygen  he was satting in the 32s with EMS required 4 L of O2 by nasal cannula to bring his sats to the low 90s upon arrival to the ED.  No chest pain or palpitations. He had chest imaging done with findings of mod right pleural effusion on right via CT chest. There is associated compressive atelectasis. There are also fibrotic changes on right lung consistent with post treatment changes. PCCM consultation placed for additional evaluation and management of complex right lung process in patient with lung cancer and copd.   07/13/24- no overnight events. Patient has not had throacentesis done yet. He is diuresing well now appx 3L net negative. Ive sent a message to Dr Jenna with IR via Epic secure chat to notify regarding thoracentesis order. Patient is on 9L/min [place].  Martin Jennings is on IV doxy and rocephin  for  CAP tx empirically. Reduced prednisone  to 35mg  PO daily   07/14/24- patient seen at bedside.  He is up awake and alert to person place and time. He was unable to have thoracentesis done due to desaturation. He was noted to have hypercapnic respiratory failure on ABG and received BIPAP ventilation.  He was mildly acidemic on this abg.  Will repeat VBG tonight. He should continue the BIPAP at bedtime for now. I have narrowed his antibiotics to augmetin PO, and reduced prednisone  to 30mg .  He is eating well without episodes of aspiration.   07/15/24- patient on 7L/min.  He is coughing with expectoration of clearish phlegm.  He did not get thoracentesis still.  Im repeating VBG and CXR today to evaluate interval changes on pleural effusion. He is net negative 2L but has renal failure and it would benefit him getting the fluid drained.  Im ordering mucomyst  today TID with resp therapist to help him expetorate some of the inspissated mucus.   07/16/24- patient s/p thoracentesis. Post removal of 900 cc straw colored plural fluid from right side the chest  x ray appears markedly improved. Plan to continue chest physiotherapy and wean O2 to optimize for dc. Repeating ABG tonight.   07/17/24- patient seen at bedside. He is in no distress.  Has fine crackles at bases bilaterally , suspect atelectatic changes. Will repeat CXR today and  initiate metaneb with albuterol  .   07/18/24-  patient on 4L/min, not confused this morning. Renal function and BUN have improved. CXR with improved opacification bilaterally. Patient was able to walk around hallway, states he feels a little weak.   07/19/24- patient seen at bedside.  He is on home setting of oxygen  requirement. At this point he may need BIPAP at home.  I have ordered this today and can follow up with him.   07/20/24- patient seen at bedside.  He is doing well and is breathing better.  He is slowly getting to baseline.  Now he is weaned to 2L/min [place].  I can  follow up with him on outpatient basis.    07/21/24- patient stable on 3L/min [place].  Reduced prednisone  to 30mg  daily   PAST MEDICAL HISTORY   Past Medical History:  Diagnosis Date   Acute respiratory failure with hypoxia (HCC) 07/29/2022   AKI (acute kidney injury) 04/04/2020   Bladder cancer (HCC) 2012   CAP (community acquired pneumonia) 04/04/2020   COPD suggested by initial evaluation 10/11/2022   COVID-19 virus infection 04/03/2020   Goals of care, counseling/discussion 05/19/2020   Hypertension    Influenza A with pneumonia 07/29/2022   Leg pain 01/25/2017   Lung cancer (HCC)    Malignant neoplasm of bladder, unspecified (HCC) 12/17/2010   Peripheral vascular disease    Sepsis (HCC) 07/29/2022     SURGICAL HISTORY   Past Surgical History:  Procedure Laterality Date   BLADDER REMOVAL     LOWER EXTREMITY ANGIOGRAPHY Left 04/12/2017   Procedure: Lower Extremity Angiography;  Surgeon: Jama Cordella MATSU, MD;  Location: [facility];  Service: Cardiovascular;  Laterality: Left;   PORTA CATH INSERTION N/A 05/22/2020   Procedure: PORTA CATH INSERTION;  Surgeon: Marea Selinda RAMAN, MD;  Location: [facility];  Service: Cardiovascular;  Laterality: N/A;   PORTA CATH REMOVAL N/A 11/15/2022   Procedure: PORTA CATH REMOVAL;  Surgeon: Marea Selinda RAMAN, MD;  Location: [facility];  Service: Cardiovascular;  Laterality: N/A;   uretostomy     VASCULAR SURGERY       FAMILY HISTORY   History reviewed. No pertinent family history.   SOCIAL HISTORY   Social History[1]   MEDICATIONS    Home Medication:  Current Outpatient Rx   Order #: [id number] Class: Normal    Current Medication: Current Medications[2]    ALLERGIES   Patient has no known allergies.     REVIEW OF SYSTEMS    Review of Systems:  Gen:  Denies  fever, sweats, chills weigh loss  HEENT: Denies blurred vision, double vision, ear pain, eye pain, hearing loss, nose bleeds, sore  throat Cardiac:  No dizziness, chest pain or heaviness, chest tightness,edema Resp:   reports dyspnea chronically  Gi: Denies swallowing difficulty, stomach pain, nausea or vomiting, diarrhea, constipation, bowel incontinence Gu:  Denies bladder incontinence, burning urine Ext:   Denies Joint pain, stiffness or swelling Skin: Denies  skin rash, easy bruising or bleeding or hives Endoc:  Denies polyuria, polydipsia , polyphagia or weight change Psych:   Denies depression, insomnia or hallucinations   Other:  All other systems negative   VS: BP 137/80 (BP Location: Left Arm)   Pulse 81   Temp 98.4 F (36.9 C) (Oral)   Resp 19   Ht 5' 11 (1.803 m)   Wt 78.3 kg   SpO2 93%   BMI 24.08 kg/m      PHYSICAL EXAM  GENERAL:NAD, no fevers, chills, no weakness no fatigue HEAD: Normocephalic, atraumatic.  EYES: Pupils equal, round, reactive to light. Extraocular muscles intact. No scleral icterus.  MOUTH: Moist mucosal membrane. Dentition intact. No abscess noted.  EAR, NOSE, THROAT: Clear without exudates. No external lesions.  NECK: Supple. No thyromegaly. No nodules. No JVD.  PULMONARY: decreased breath sounds with mild rhonchi worse at bases bilaterally.  CARDIOVASCULAR: S1 and S2. Regular rate and rhythm. No murmurs, rubs, or gallops. No edema. Pedal pulses 2+ bilaterally.  GASTROINTESTINAL: Soft, nontender, nondistended. No masses. Positive bowel sounds. No hepatosplenomegaly.  MUSCULOSKELETAL: No swelling, clubbing, or edema. Range of motion full in all extremities.  NEUROLOGIC: Cranial nerves II through XII are intact. No gross focal neurological deficits. Sensation intact. Reflexes intact.  SKIN: No ulceration, lesions, rashes, or cyanosis. Skin warm and dry. Turgor intact.  PSYCHIATRIC: Mood, affect within normal limits. The patient is awake, alert and oriented x 3. Insight, judgment intact.       IMAGING   Narrative & Impression  CLINICAL DATA:  Concern for  pneumonia.   EXAM: CT CHEST WITHOUT CONTRAST   TECHNIQUE: Multidetector CT imaging of the chest was performed following the standard protocol without IV contrast.   RADIATION DOSE REDUCTION: This exam was performed according to the departmental dose-optimization program which includes automated exposure control, adjustment of the mA and/or kV according to patient size and/or use of iterative reconstruction technique.   COMPARISON:  Chest radiograph dated 07/10/2024 and CT dated 09/10/2022.   FINDINGS: Evaluation of this exam is limited in the absence of intravenous contrast.   Cardiovascular: There is mild cardiomegaly. No pericardial effusion. Advanced 3 vessel coronary vascular calcification. Mild atherosclerotic calcification of the thoracic aorta. No aneurysmal dilatation. There is dilatation of the main pulmonary trunk suggestive of pulmonary hypertension.   Mediastinum/Nodes: No obvious hilar adenopathy. Evaluation however is limited due to consolidative changes of the lungs. The esophagus is grossly unremarkable no mediastinal fluid collection.   Lungs/Pleura: Moderate right pleural effusion. There is partial compressive atelectasis of the right lower lobe versus pneumonia. Background of emphysema and diffuse interstitial coarsening. Progression of bandlike consolidation extending from the right hilum into the anterior right upper lobe since the prior CT likely representing post treatment fibrosis. Pneumonia is not excluded. Additional area of consolidation in the lateral right middle lobe also concerning for pneumonia. No pneumothorax. The central airways are patent.   Upper Abdomen: No acute abnormality.   Musculoskeletal: No acute osseous pathology.   IMPRESSION: 1. Moderate right pleural effusion with partial compressive atelectasis of the right lower lobe versus pneumonia. 2. Progression of bandlike consolidation extending from the right hilum into the  anterior right upper lobe since the prior CT likely representing post treatment fibrosis. Pneumonia is not excluded. 3. Additional area of consolidation in the lateral right middle lobe also concerning for pneumonia. 4. Mild cardiomegaly with advanced 3 vessel coronary vascular calcification. 5. Aortic Atherosclerosis (ICD10-I70.0) and Emphysema (ICD10-J43.9).     Electronically Signed   By: Vanetta Chou M.D.   On: 07/11/2024 16:20    ASSESSMENT/PLAN   Acute on chronic hypoxemic respiratory failure   Multifactorial in etiology including possible pneumonia of right lung, Cancer of right lung, moderate pleural effusion and atelectasis of right lower lobe.   - will target each underlying cause for improvement in oxygenation    Moderate right pleural effusion     - IR consult -s/p thoracentesis   COPD with acute exacerbation      -  agree with current COPD carepath     - patient is now close to baseline and is on augmentin  now with mucomyst     - PT/OT chest PT    - nebulizer therapy with duoneb and inhaler therapy with Breo ellipta    Compressive atelectasis  Patient weak and unable to use incentive spirometry very well,  I have ordered metaneb therapy with respiratory therapist PT/OT     Physical deconditioning  - [facility]ietn would benefit from [facility]        Thank you for allowing me to participate in the care of this patient.   Patient/Family are satisfied with care plan and all questions have been answered.    Provider disclosure: Patient with at least one acute or chronic illness or injury that poses a threat to life or bodily function and is being managed actively during this encounter.  All of the below services have been performed independently by signing provider:  review of prior documentation from internal and or external health records.  Review of previous and current lab results.  Interview and comprehensive assessment during  patient visit today. Review of current and previous chest radiographs/CT scans. Discussion of management and test interpretation with health care team and patient/family.   This document was prepared using Dragon voice recognition software and may include unintentional dictation errors.     Milany Geck, M.D.  Division of Pulmonary & Critical Care Medicine                      [1]  Social History Tobacco Use   Smoking status: Former    Current packs/day: 0.00    Average packs/day: 0.5 packs/day for 50.0 years (25.0 ttl pk-yrs)    Types: Cigarettes    Quit date: 08/2023    Years since quitting: 0.9   Smokeless tobacco: Never   Tobacco comments:    Per pt. He smokes 1 pack every 3 days  Vaping Use   Vaping status: Never Used  Substance Use Topics   Alcohol use: No   Drug use: Not Currently    Types: Marijuana  [2]  Current Facility-Administered Medications:    acetaminophen  (TYLENOL ) tablet 650 mg, 650 mg, Oral, Q6H PRN **OR** acetaminophen  (TYLENOL ) suppository 650 mg, 650 mg, Rectal, Q6H PRN, Mansy, Jan A, MD   allopurinol  (ZYLOPRIM ) tablet 100 mg, 100 mg, Oral, BID, Mansy, Jan A, MD, 100 mg at 07/21/24 9074   amLODipine  (NORVASC ) tablet 10 mg, 10 mg, Oral, Daily, Mansy, Jan A, MD, 10 mg at 07/21/24 9074   atorvastatin  (LIPITOR) tablet 20 mg, 20 mg, Oral, QPM, Mansy, Jan A, MD, 20 mg at 07/20/24 1819   chlorpheniramine-HYDROcodone  (TUSSIONEX) 10-8 MG/5ML suspension 5 mL, 5 mL, Oral, Q12H PRN, Mansy, Jan A, MD, 5 mL at 07/13/24 1120   clopidogrel  (PLAVIX ) tablet 75 mg, 75 mg, Oral, Daily, Mansy, Jan A, MD, 75 mg at 07/21/24 9074   empagliflozin  (JARDIANCE ) tablet 10 mg, 10 mg, Oral, Daily, Mansy, Jan A, MD, 10 mg at 07/21/24 9074   enoxaparin  (LOVENOX ) injection 30 mg, 30 mg, Subcutaneous, Q24H, Mansy, Jan A, MD, 30 mg at 07/20/24 2133   fluticasone  (FLONASE ) 50 MCG/ACT nasal spray 1 spray, 1 spray, Each Nare, Daily, Ponnala, Shruthi, MD, 1 spray at 07/19/24  1830   fluticasone  furoate-vilanterol (BREO ELLIPTA ) 200-25 MCG/ACT 1 puff, 1 puff, Inhalation, Daily, Agbata, Tochukwu, MD, 1 puff at 07/21/24 0935   guaiFENesin  (MUCINEX ) 12 hr tablet 600 mg, 600  mg, Oral, BID, Mansy, Jan A, MD, 600 mg at 07/21/24 9074   insulin  aspart (novoLOG ) injection 0-20 Units, 0-20 Units, Subcutaneous, TID WC, Mansy, Madison LABOR, MD, 4 Units at 07/21/24 9072   ipratropium-albuterol  (DUONEB) 0.5-2.5 (3) MG/3ML nebulizer solution 3 mL, 3 mL, Nebulization, Q4H PRN, Mansy, Jan A, MD   ipratropium-albuterol  (DUONEB) 0.5-2.5 (3) MG/3ML nebulizer solution 3 mL, 3 mL, Nebulization, BID, Devlin Mcveigh, MD, 3 mL at 07/21/24 9183   loratadine  (CLARITIN ) tablet 10 mg, 10 mg, Oral, Daily, Mansy, Jan A, MD, 10 mg at 07/21/24 9074   magnesium  hydroxide (MILK OF MAGNESIA) suspension 30 mL, 30 mL, Oral, Daily PRN, Mansy, Jan A, MD, 30 mL at 07/16/24 1839   ondansetron  (ZOFRAN ) tablet 4 mg, 4 mg, Oral, Q6H PRN **OR** ondansetron  (ZOFRAN ) injection 4 mg, 4 mg, Intravenous, Q6H PRN, Mansy, Jan A, MD   pantoprazole  (PROTONIX ) EC tablet 40 mg, 40 mg, Oral, Daily, Ponnala, Shruthi, MD, 40 mg at 07/21/24 0925   polyethylene glycol (MIRALAX  / GLYCOLAX ) packet 17 g, 17 g, Oral, Daily, Agbata, Tochukwu, MD, 17 g at 07/21/24 9073   predniSONE  (DELTASONE ) tablet 40 mg, 40 mg, Oral, Daily, Ponnala, Shruthi, MD, 40 mg at 07/21/24 9074   senna-docusate (Senokot-S) tablet 2 tablet, 2 tablet, Oral, QHS, Agbata, Tochukwu, MD, 2 tablet at 07/20/24 2132   sodium zirconium cyclosilicate  (LOKELMA ) packet 10 g, 10 g, Oral, Daily, Ponnala, Shruthi, MD, 10 g at 07/21/24 9074   traZODone  (DESYREL ) tablet 25 mg, 25 mg, Oral, QHS PRN, Mansy, Jan A, MD, 25 mg at 07/20/24 2327  Facility-Administered Medications Ordered in Other Encounters:    sodium chloride  flush (NS) 0.9 % injection 10 mL, 10 mL, Intravenous, PRN, Melanee Annah BROCKS, MD, 10 mL at 09/18/20 0850  "

## 2024-07-21 NOTE — Plan of Care (Signed)
 " Problem: Education: Goal: Ability to describe self-care measures that may prevent or decrease complications (Diabetes Survival Skills Education) will improve 07/21/2024 0753 by Georg Ozell BRAVO, LPN Outcome: Progressing 07/21/2024 0752 by Georg Ozell BRAVO, LPN Outcome: Progressing Goal: Individualized Educational Video(s) 07/21/2024 0753 by Georg Ozell BRAVO, LPN Outcome: Progressing 07/21/2024 0752 by Georg Ozell BRAVO, LPN Outcome: Progressing   Problem: Coping: Goal: Ability to adjust to condition or change in health will improve 07/21/2024 0753 by Georg Ozell BRAVO, LPN Outcome: Progressing 07/21/2024 0752 by Georg Ozell BRAVO, LPN Outcome: Progressing   Problem: Fluid Volume: Goal: Ability to maintain a balanced intake and output will improve 07/21/2024 0753 by Georg Ozell BRAVO, LPN Outcome: Progressing 07/21/2024 0752 by Georg Ozell BRAVO, LPN Outcome: Progressing   Problem: Health Behavior/Discharge Planning: Goal: Ability to identify and utilize available resources and services will improve 07/21/2024 0753 by Georg Ozell BRAVO, LPN Outcome: Progressing 07/21/2024 0752 by Georg Ozell BRAVO, LPN Outcome: Progressing Goal: Ability to manage health-related needs will improve 07/21/2024 0753 by Georg Ozell BRAVO, LPN Outcome: Progressing 07/21/2024 0752 by Georg Ozell BRAVO, LPN Outcome: Progressing   Problem: Metabolic: Goal: Ability to maintain appropriate glucose levels will improve 07/21/2024 0753 by Georg Ozell BRAVO, LPN Outcome: Progressing 07/21/2024 0752 by Georg Ozell BRAVO, LPN Outcome: Progressing   Problem: Nutritional: Goal: Maintenance of adequate nutrition will improve 07/21/2024 0753 by Georg Ozell BRAVO, LPN Outcome: Progressing 07/21/2024 0752 by Georg Ozell BRAVO, LPN Outcome: Progressing Goal: Progress toward achieving an optimal weight will improve 07/21/2024 0753 by Georg Ozell BRAVO, LPN Outcome:  Progressing 07/21/2024 0752 by Georg Ozell BRAVO, LPN Outcome: Progressing   Problem: Skin Integrity: Goal: Risk for impaired skin integrity will decrease 07/21/2024 0753 by Georg Ozell BRAVO, LPN Outcome: Progressing 07/21/2024 0752 by Georg Ozell BRAVO, LPN Outcome: Progressing   Problem: Tissue Perfusion: Goal: Adequacy of tissue perfusion will improve 07/21/2024 0753 by Georg Ozell BRAVO, LPN Outcome: Progressing 07/21/2024 0752 by Georg Ozell BRAVO, LPN Outcome: Progressing   Problem: Education: Goal: Knowledge of General Education information will improve Description: Including pain rating scale, medication(s)/side effects and non-pharmacologic comfort measures 07/21/2024 0753 by Georg Ozell BRAVO, LPN Outcome: Progressing 07/21/2024 0752 by Georg Ozell BRAVO, LPN Outcome: Progressing   Problem: Health Behavior/Discharge Planning: Goal: Ability to manage health-related needs will improve 07/21/2024 0753 by Georg Ozell BRAVO, LPN Outcome: Progressing 07/21/2024 0752 by Georg Ozell BRAVO, LPN Outcome: Progressing   Problem: Clinical Measurements: Goal: Ability to maintain clinical measurements within normal limits will improve 07/21/2024 0753 by Georg Ozell BRAVO, LPN Outcome: Progressing 07/21/2024 0752 by Georg Ozell BRAVO, LPN Outcome: Progressing Goal: Will remain free from infection 07/21/2024 0753 by Georg Ozell BRAVO, LPN Outcome: Progressing 07/21/2024 0752 by Georg Ozell BRAVO, LPN Outcome: Progressing Goal: Diagnostic test results will improve 07/21/2024 0753 by Georg Ozell BRAVO, LPN Outcome: Progressing 07/21/2024 0752 by Georg Ozell BRAVO, LPN Outcome: Progressing Goal: Respiratory complications will improve 07/21/2024 0753 by Georg Ozell BRAVO, LPN Outcome: Progressing 07/21/2024 0752 by Georg Ozell BRAVO, LPN Outcome: Progressing Goal: Cardiovascular complication will be avoided 07/21/2024 0753 by Georg Ozell BRAVO, LPN Outcome: Progressing 07/21/2024 0752 by Georg Ozell BRAVO, LPN Outcome: Progressing   Problem: Activity: Goal: Risk for activity intolerance will decrease 07/21/2024 0753 by Georg Ozell BRAVO, LPN Outcome: Progressing 07/21/2024 0752 by Georg Ozell BRAVO, LPN Outcome: Progressing   Problem: Nutrition: Goal: Adequate nutrition will be maintained 07/21/2024 0753 by Georg Ozell BRAVO, LPN Outcome: Progressing 07/21/2024 0752 by Georg Ozell BRAVO, LPN Outcome: Progressing  Problem: Coping: Goal: Level of anxiety will decrease 07/21/2024 0753 by Georg Ozell BRAVO, LPN Outcome: Progressing 07/21/2024 0752 by Georg Ozell BRAVO, LPN Outcome: Progressing   Problem: Elimination: Goal: Will not experience complications related to bowel motility 07/21/2024 0753 by Georg Ozell BRAVO, LPN Outcome: Progressing 07/21/2024 0752 by Georg Ozell BRAVO, LPN Outcome: Progressing Goal: Will not experience complications related to urinary retention 07/21/2024 0753 by Georg Ozell BRAVO, LPN Outcome: Progressing 07/21/2024 0752 by Georg Ozell BRAVO, LPN Outcome: Progressing   Problem: Pain Managment: Goal: General experience of comfort will improve and/or be controlled 07/21/2024 0753 by Georg Ozell BRAVO, LPN Outcome: Progressing 07/21/2024 0752 by Georg Ozell BRAVO, LPN Outcome: Progressing   Problem: Safety: Goal: Ability to remain free from injury will improve 07/21/2024 0753 by Georg Ozell BRAVO, LPN Outcome: Progressing 07/21/2024 0752 by Georg Ozell BRAVO, LPN Outcome: Progressing   Problem: Skin Integrity: Goal: Risk for impaired skin integrity will decrease 07/21/2024 0753 by Georg Ozell BRAVO, LPN Outcome: Progressing 07/21/2024 0752 by Georg Ozell BRAVO, LPN Outcome: Progressing   Problem: Education: Goal: Knowledge of disease or condition will improve 07/21/2024 0753 by Georg Ozell BRAVO, LPN Outcome:  Progressing 07/21/2024 0752 by Georg Ozell BRAVO, LPN Outcome: Progressing Goal: Knowledge of the prescribed therapeutic regimen will improve 07/21/2024 0753 by Georg Ozell BRAVO, LPN Outcome: Progressing 07/21/2024 0752 by Georg Ozell BRAVO, LPN Outcome: Progressing Goal: Individualized Educational Video(s) 07/21/2024 0753 by Georg Ozell BRAVO, LPN Outcome: Progressing 07/21/2024 0752 by Georg Ozell BRAVO, LPN Outcome: Progressing   Problem: Activity: Goal: Ability to tolerate increased activity will improve 07/21/2024 0753 by Georg Ozell BRAVO, LPN Outcome: Progressing 07/21/2024 0752 by Georg Ozell BRAVO, LPN Outcome: Progressing Goal: Will verbalize the importance of balancing activity with adequate rest periods 07/21/2024 0753 by Georg Ozell BRAVO, LPN Outcome: Progressing 07/21/2024 0752 by Georg Ozell BRAVO, LPN Outcome: Progressing   Problem: Respiratory: Goal: Ability to maintain a clear airway will improve 07/21/2024 0753 by Georg Ozell BRAVO, LPN Outcome: Progressing 07/21/2024 0752 by Georg Ozell BRAVO, LPN Outcome: Progressing Goal: Levels of oxygenation will improve 07/21/2024 0753 by Georg Ozell BRAVO, LPN Outcome: Progressing 07/21/2024 0752 by Georg Ozell BRAVO, LPN Outcome: Progressing Goal: Ability to maintain adequate ventilation will improve 07/21/2024 0753 by Georg Ozell BRAVO, LPN Outcome: Progressing 07/21/2024 0752 by Georg Ozell BRAVO, LPN Outcome: Progressing   "

## 2024-07-22 DIAGNOSIS — J441 Chronic obstructive pulmonary disease with (acute) exacerbation: Secondary | ICD-10-CM | POA: Diagnosis not present

## 2024-07-22 LAB — BASIC METABOLIC PANEL WITH GFR
Anion gap: 7 (ref 5–15)
BUN: 46 mg/dL — ABNORMAL HIGH (ref 8–23)
CO2: 27 mmol/L (ref 22–32)
Calcium: 8.4 mg/dL — ABNORMAL LOW (ref 8.9–10.3)
Chloride: 107 mmol/L (ref 98–111)
Creatinine, Ser: 2.72 mg/dL — ABNORMAL HIGH (ref 0.61–1.24)
GFR, Estimated: 24 mL/min — ABNORMAL LOW
Glucose, Bld: 118 mg/dL — ABNORMAL HIGH (ref 70–99)
Potassium: 4.9 mmol/L (ref 3.5–5.1)
Sodium: 141 mmol/L (ref 135–145)

## 2024-07-22 LAB — GLUCOSE, CAPILLARY
Glucose-Capillary: 94 mg/dL (ref 70–99)
Glucose-Capillary: 175 mg/dL — ABNORMAL HIGH (ref 70–99)

## 2024-07-22 MED ORDER — PREDNISONE 10 MG PO TABS
ORAL_TABLET | ORAL | 0 refills | Status: AC
  Filled 2024-07-22: qty 15, 8d supply, fill #0

## 2024-07-22 MED ORDER — SODIUM ZIRCONIUM CYCLOSILICATE 10 G PO PACK
10.0000 g | PACK | Freq: Every day | ORAL | 1 refills | Status: AC
  Filled 2024-07-22: qty 30, 30d supply, fill #0

## 2024-07-22 MED ORDER — PANTOPRAZOLE SODIUM 40 MG PO TBEC
40.0000 mg | DELAYED_RELEASE_TABLET | Freq: Every day | ORAL | 0 refills | Status: AC
  Filled 2024-07-22: qty 10, 10d supply, fill #0

## 2024-07-22 NOTE — Assessment & Plan Note (Signed)
 Patient is seen by urology, who manage this condition.  He is well controlled with current therapy.   Will defer to them for further changes to plan of care.

## 2024-07-22 NOTE — Progress Notes (Signed)
 "   Annual Wellness Visit  Patient: Martin Jennings, Male    DOB: [birth year], 73 y.o.   MRN: [id number] Visit Date: 07/22/2024  Today's Provider: ALAN CHRISTELLA ARRANT, FNP  Subjective:    Chief Complaint  Patient presents with   Annual Exam    AWV   Martin Jennings is a 73 y.o. male who presents today for his Annual Wellness Visit.  Past Medical History:  Diagnosis Date   Acute respiratory failure with hypoxia (HCC) 07/29/2022   AKI (acute kidney injury) 04/04/2020   Bladder cancer (HCC) 2012   CAP (community acquired pneumonia) 04/04/2020   COPD suggested by initial evaluation 10/11/2022   COVID-19 virus infection 04/03/2020   Goals of care, counseling/discussion 05/19/2020   Hypertension    Influenza A with pneumonia 07/29/2022   Leg pain 01/25/2017   Lung cancer (HCC)    Malignant neoplasm of bladder, unspecified (HCC) 12/17/2010   Peripheral vascular disease    Sepsis (HCC) 07/29/2022   Past Surgical History:  Procedure Laterality Date   BLADDER REMOVAL     LOWER EXTREMITY ANGIOGRAPHY Left 04/12/2017   Procedure: Lower Extremity Angiography;  Surgeon: Jama Cordella MATSU, MD;  Location: [facility];  Service: Cardiovascular;  Laterality: Left;   PORTA CATH INSERTION N/A 05/22/2020   Procedure: PORTA CATH INSERTION;  Surgeon: Marea Selinda RAMAN, MD;  Location: [facility];  Service: Cardiovascular;  Laterality: N/A;   PORTA CATH REMOVAL N/A 11/15/2022   Procedure: PORTA CATH REMOVAL;  Surgeon: Marea Selinda RAMAN, MD;  Location: [facility];  Service: Cardiovascular;  Laterality: N/A;   uretostomy     VASCULAR SURGERY     No family history on file. Social History   Socioeconomic History   Marital status: Married    Spouse name: Not on file   Number of children: Not on file   Years of education: Not on file   Highest education level: Not on file  Occupational History   Not on file  Tobacco Use   Smoking status: Former    Current packs/day: 0.00     Average packs/day: 0.5 packs/day for 50.0 years (25.0 ttl pk-yrs)    Types: Cigarettes    Quit date: 08/2023    Years since quitting: 0.9   Smokeless tobacco: Never   Tobacco comments:    Per pt. He smokes 1 pack every 3 days  Vaping Use   Vaping status: Never Used  Substance and Sexual Activity   Alcohol use: No   Drug use: Not Currently    Types: Marijuana   Sexual activity: Not Currently  Other Topics Concern   Not on file  Social History Narrative   Not on file   Social Drivers of Health   Tobacco Use: Medium Risk (07/10/2024)   Patient History    Smoking Tobacco Use: Former    Smokeless Tobacco Use: Never    Passive Exposure: Not on Actuary Strain: Not on file  Food Insecurity: No Food Insecurity (07/10/2024)   Epic    Worried About Programme Researcher, Broadcasting/film/video in the Last Year: Never true    Ran Out of Food in the Last Year: Never true  Transportation Needs: No Transportation Needs (07/10/2024)   Epic    Lack of Transportation (Medical): No    Lack of Transportation (Non-Medical): No  Physical Activity: Not on file  Stress: No Stress Concern Present (07/03/2024)   [employer] of Occupational Health - Occupational Stress  Questionnaire    Feeling of Stress: Not at all  Social Connections: Moderately Isolated (07/10/2024)   Social Connection and Isolation Panel    Frequency of Communication with Friends and Family: Three times a week    Frequency of Social Gatherings with Friends and Family: Three times a week    Attends Religious Services: Never    Active Member of Clubs or Organizations: No    Attends Banker Meetings: Never    Marital Status: Married  Catering Manager Violence: Not At Risk (07/10/2024)   Epic    Fear of Current or Ex-Partner: No    Emotionally Abused: No    Physically Abused: No    Sexually Abused: No  Depression (PHQ2-9): Medium Risk (07/03/2024)   Depression (PHQ2-9)    PHQ-2 Score: 10  Alcohol Screen: Not on  file  Housing: Low Risk (07/10/2024)   Epic    Unable to Pay for Housing in the Last Year: No    Number of Times Moved in the Last Year: 0    Homeless in the Last Year: No  Utilities: Not At Risk (07/10/2024)   Epic    Threatened with loss of utilities: No  Health Literacy: Not on file    Medications: Show/hide medication list[1]  Allergies[2]  Patient Care Team: Orlean Alan HERO, FNP as PCP - General (Family Medicine) Verdene Gills, RN as Oncology Nurse Navigator Melanee Annah BROCKS, MD as Consulting Physician (Hematology and Oncology) Lenn Aran, MD as Consulting Physician (Radiation Oncology)  Review of Systems  Constitutional:  Positive for fatigue.  Respiratory:  Positive for shortness of breath and wheezing.   All other systems reviewed and are negative.     Objective:    Vitals: BP 116/68   Pulse 72   Ht 5' 11 (1.803 m)   Wt 187 lb 6.4 oz (85 kg)   SpO2 94%   BMI 26.14 kg/m   Physical Exam Vitals and nursing note reviewed.  Constitutional:      Appearance: Normal appearance. He is normal weight.  Eyes:     Pupils: Pupils are equal, round, and reactive to light.  Cardiovascular:     Rate and Rhythm: Normal rate and regular rhythm.     Pulses: Normal pulses.     Heart sounds: Normal heart sounds.  Pulmonary:     Effort: Pulmonary effort is normal.     Breath sounds: Wheezing present.  Neurological:     General: No focal deficit present.     Mental Status: He is alert and oriented to person, place, and time. Mental status is at baseline.  Psychiatric:        Mood and Affect: Mood normal.        Behavior: Behavior normal.     Most recent functional status assessment:    07/10/2024   10:42 PM  In your present state of health, do you have any difficulty performing the following activities:  Hearing? 0  Vision? 0  Difficulty concentrating or making decisions? 0    Most recent fall risk assessment:    07/03/2024    4:40 PM  Fall Risk   Falls  in the past year? 0  Number falls in past yr: 0  Injury with Fall? 0  Risk for fall due to : Orthopedic patient  Follow up Falls evaluation completed;Education provided;Falls prevention discussed     Most recent depression screenings:    07/03/2024    4:48 PM 10/18/2023    2:05 PM  PHQ 2/9 Scores  PHQ - 2 Score 4 0  PHQ- 9 Score 10 6      Data saved with a previous flowsheet row definition    Most recent cognitive screening:    07/03/2024    4:40 PM  6CIT Screen  What Year? 0 points  What month? 0 points  What time? 0 points  Count back from 20 0 points  Months in reverse 0 points  Repeat phrase 0 points  Total Score 0 points    Results for orders placed or performed in visit on 07/03/24  Iron, TIBC and Ferritin Panel  Result Value Ref Range   Total Iron Binding Capacity 277 250 - 450 ug/dL   UIBC 783 888 - 656 ug/dL   Iron 61 38 - 830 ug/dL   Iron Saturation 22 15 - 55 %   Ferritin 92 30 - 400 ng/mL  Vitamin D  (25 hydroxy)  Result Value Ref Range   Vit D, 25-Hydroxy 23.6 (L) 30.0 - 100.0 ng/mL  Vitamin B12  Result Value Ref Range   Vitamin B-12 503 232 - 1,245 pg/mL       Assessment & Plan:      Annual wellness visit done today including the all of the following: Reviewed patient's Family Medical History Reviewed and updated list of patient's medical providers Assessment of cognitive impairment was done Assessed patient's functional ability Established a written schedule for health screening services Health Risk Assessent Completed and Reviewed  Exercise Activities and Dietary recommendations  Goals   None     Immunization History  Administered Date(s) Administered   Influenza-Unspecified 04/24/2024   Moderna Sars-Covid-2 Vaccination 03/24/2020, 04/21/2020   Pneumococcal-Unspecified 04/24/2024    Health Maintenance  Topic Date Due   FOOT EXAM  Never done   DTaP/Tdap/Td (1 - Tdap) Never done   Pneumococcal Vaccine: 50+ Years (1 of 2 - PCV)  02/03/1970   Zoster Vaccines- Shingrix (1 of 2) Never done   COVID-19 Vaccine (3 - Moderna risk series) 05/19/2020   Fecal DNA (Cologuard)  09/11/2022   Medicare Annual Wellness (AWV)  04/07/2023   Diabetic kidney evaluation - Urine ACR  04/18/2025 (Originally 02/03/1969)   HEMOGLOBIN A1C  11/22/2024   OPHTHALMOLOGY EXAM  07/05/2025   Diabetic kidney evaluation - eGFR measurement  07/22/2025   Influenza Vaccine  Completed   Hepatitis C Screening  Completed   Meningococcal B Vaccine  Aged Out     Discussed health benefits of physical activity, and encouraged him to engage in regular exercise appropriate for his age and condition.    Assessment & Plan Stage 3b chronic kidney disease (CKD) (HCC) Setting patient up for referral to nephrology .  Will defer to them for further treatment changes.  Reassess at follow up.  Type 2 diabetes mellitus with hyperglycemia, without long-term current use of insulin  (HCC) Checking labs today. Will call pt. With results  Continue current diabetes POC, as patient has been well controlled on current regimen.  Will adjust meds if needed based on labs.   -CBC w/Diff -CMP w/eGFR -Hemoglobin A1C  Screening for colon cancer Sending order for cologuard.   Vitamin D  deficiency, unspecified B12 deficiency due to diet Other fatigue Checking labs today.  Will continue supplements as needed.   - Vitamin D  - Vitamin B12 - TSH  Other artificial openings of urinary tract status (HCC) Patient is seen by urology, who manage this condition.  He is well controlled with current therapy.   Will defer  to them for further changes to plan of care.  Interstitial pulmonary disease, unspecified (HCC) Patient is seen by pulmonary, who manage this condition.  He is well controlled with current therapy.   Will defer to them for further changes to plan of care.  Malignant neoplasm of lung, unspecified laterality, unspecified part of lung [facility]) Patient is  seen by oncology, who manage this condition.  He is well controlled with current therapy.   Will defer to them for further changes to plan of care.       ALAN CHRISTELLA ARRANT, FNP   07/03/2024  This document may have been prepared by [facility] Voice Recognition software and as such may include unintentional dictation errors.      [1]  Outpatient Medications Prior to Visit  Medication Sig   albuterol  (PROVENTIL ) (2.5 MG/3ML) 0.083% nebulizer solution Take 3 mLs (2.5 mg total) by nebulization every 4 (four) hours as needed for wheezing or shortness of breath.   albuterol  (VENTOLIN  HFA) 108 (90 Base) MCG/ACT inhaler Inhale 2 puffs into the lungs every 6 (six) hours as needed for wheezing or shortness of breath.   amLODipine  (NORVASC ) 10 MG tablet TAKE 1 TABLET BY MOUTH DAILY   atorvastatin  (LIPITOR) 20 MG tablet TAKE 1 TABLET BY MOUTH IN THE  EVENING   budesonide-glycopyrrolate-formoterol (BREZTRI AEROSPHERE) 160-9-4.8 MCG/ACT AERO inhaler Inhale 2 puffs into the lungs 2 (two) times daily. THROUGH AZ&Me PAP   clopidogrel  (PLAVIX ) 75 MG tablet TAKE 1 TABLET BY MOUTH DAILY   fexofenadine (ALLEGRA) 180 MG tablet Take 180 mg by mouth daily as needed.   [DISCONTINUED] allopurinol  (ZYLOPRIM ) 100 MG tablet TAKE 1 TABLET BY MOUTH TWICE  DAILY   [DISCONTINUED] carvedilol  (COREG ) 6.25 MG tablet Take 1 tablet (6.25 mg total) by mouth 2 (two) times daily.   [DISCONTINUED] colchicine  0.6 MG tablet TAKE ONE TABLET BY MOUTH EVERY DAY   [DISCONTINUED] varenicline  (CHANTIX ) 0.5 MG tablet Days 1 to 3, take 0.5 mg tablet once daily. On Days 4 to 7 take 0.5 mg tablet twice daily, on days 8 to ongoing, take 1 mg tablet twice daily.   Facility-Administered Medications Prior to Visit  Medication Dose Route Frequency Provider   sodium chloride  flush (NS) 0.9 % injection 10 mL  10 mL Intravenous PRN Rao, Archana C, MD  [2] No Known Allergies  "

## 2024-07-22 NOTE — Plan of Care (Signed)
  Problem: Education: Goal: Ability to describe self-care measures that may prevent or decrease complications (Diabetes Survival Skills Education) will improve Outcome: Progressing Goal: Individualized Educational Video(s) Outcome: Progressing   Problem: Clinical Measurements: Goal: Ability to maintain clinical measurements within normal limits will improve Outcome: Progressing Goal: Will remain free from infection Outcome: Progressing Goal: Diagnostic test results will improve Outcome: Progressing Goal: Respiratory complications will improve Outcome: Progressing Goal: Cardiovascular complication will be avoided Outcome: Progressing

## 2024-07-22 NOTE — Progress Notes (Signed)
 Mobility Specialist - Progress Note  Pre-mobility:  SpO2-90%  During mobility: SpO2-86% recovered to > 89% in < 1 min on O2@ 3L  Post-mobility: SPO2- 91%   07/22/24 1000  Mobility  Activity Ambulated with assistance;Stood at bedside;Dangled on edge of bed  Level of Assistance Contact guard assist, steadying assist  Assistive Device Front wheel walker  Distance Ambulated (ft) 180 ft  Range of Motion/Exercises Active  Activity Response Tolerated fair  Mobility visit 1 Mobility  Mobility Specialist Start Time (ACUTE ONLY) [id number]  Mobility Specialist Stop Time (ACUTE ONLY) 0956  Mobility Specialist Time Calculation (min) (ACUTE ONLY) 17 min   Pt was supine in bed on O2 @ 3 L upon entry. Pt agreed to mobility. Pt is able today to get to the EOB independently with bed features. Pt is able today to STS independently  with 2 WW. Pt O2 vitals were taken throughout activity as a precaution. Pt ambulated well. Pt did use recovery break to check vitals. Pt did Desat to 86% on O2 @ 3 L. Pt used recovery breaths and O2 vitals went above 89% in less than 2 min. After activity pt returned to the room back in bed with needs in reach and bed alarm on.  Clem Rodes Mobility Specialist 07/22/2024, 10:35 AM

## 2024-07-22 NOTE — Assessment & Plan Note (Signed)
 Checking labs today. Will call pt. With results  Continue current diabetes POC, as patient has been well controlled on current regimen.  Will adjust meds if needed based on labs.   -CBC w/Diff -CMP w/eGFR -Hemoglobin A1C

## 2024-07-22 NOTE — Discharge Summary (Signed)
 " Physician Discharge Summary   Patient: Martin Jennings MRN: [id number] DOB: 1951-03-04  Admit date:     07/10/2024  Discharge date: 07/22/2024  Discharge Physician: Laree Lock   PCP: Orlean Alan HERO, FNP   Recommendations at discharge:   Follow-up with PCP within 5 days -repeat BMP -monitor creatinine, potassium  Follow-up with pulmonary outpatient Follow-up with nephrology outpatient -referral provided On home oxygen , home PT arranged  Discharge Diagnoses: Principal Problem:   COPD exacerbation (HCC) Active Problems:   Acute hypercapnic respiratory failure (HCC)   Acute on chronic diastolic CHF (congestive heart failure) (HCC)   Acute on chronic respiratory failure with hypoxia (HCC)   Essential hypertension   Type 2 diabetes mellitus with chronic kidney disease, without long-term current use of insulin  (HCC)   Dyslipidemia   Gout   Hospital Course: Martin Jennings is a 72 y.o. male with medical history significant for COPD, type 2 diabetes mellitus, PVD, IBD and bronchiectasis, as well as lung cancer, essential hypertension, dyslipidemia, and gout, who presented to the emergency room with acute onset of worsening dyspnea over the last week with associated cough with excessive clear mucus production.  He has been having lightheadedness when attempting to walk to the bathroom.  Was admitted for acute on chronic hypoxic and hypercapnic respiratory failure, acute COPD exacerbation.  On presentation was on BiPAP, required HFNC and now on 2-3 L [place].  Hospital course complicated by AKI on CKD which improved, found to have moderate right-sided pleural effusion s/p thoracentesis.  Hospital course as below   Acute on chronic respiratory failure with hypoxia and hypercapnia Multifactorial including pneumonia right lung, cancer right lung, moderate right pleural effusion, atelectasis of right lower lobe Oxygen  requirements have significantly improved was on BiPAP/HFNC, now at 3 L [place], At  baseline patient wears 2 L of oxygen  continuous Seen by pulmonary, follow-up outpatient   Acute COPD exacerbation Atelectasis Completed an empiric antibiotics for possible pneumonia Continue inhaled steroids, bronchodilator therapy Completed steroids Reported mild hemoptysis which resolved Appreciate pulmonary input   Moderate right-sided pleural effusion S/p thoracentesis 12/22, 900 mL pleural fluid drained Body fluid culture showed NGTD and appears to be a transudate   Chronic diastolic dysfunction CHF HTN S/p IV diuresis, had AKI Last known LVEF of 60 - 65% from a 2D echocardiogram which was done 12/18 Continue amlodipine , Jardiance .  Not on oral diuretics at home   AKI superimposed on stage IV chronic kidney disease  Baseline serum creatinine ~ 2.7-1.9 , serum creatinine peaked up to 3.25, and slowly improved to 2.70 Monitor creatinine.  Follow-up with PCP for repeat BMP in 5 days Discussed with nephrology, no new recommendations.  Referral provided to follow-up outpatient as per patient request   Hyperkalemia On [place]  daily due to kidney disease as above   Diabetes mellitus with complications of stage IV chronic kidney disease   History of lung cancer History of stage III adenocarcinoma of the lung status post chemo/radiation as well as maintenance durvalumab  Currently in remission Follow-up with oncology for surveillance   History of coronary artery disease Elevated troponin Troponin is flat and is most likely secondary to demand ischemia from tachypnea and respiratory distress Echocardiogram does not show any regional wall motion abnormalities Continue Plavix  and atorvastatin    Acute gout flare -resolved Left lower extremity, Prednisone  taper Continue allopurinol    Physical Deconditioning Home PT arranged   Consultants: Pulmonary Procedures performed: None Disposition: Home health Diet recommendation:  Discharge Diet Orders (From admission, onward)  Start     Ordered   07/22/24 0000  Diet renal/carb modified with fluid restriction        07/22/24 1534           DISCHARGE MEDICATION: Allergies as of 07/22/2024   No Known Allergies      Medication List     TAKE these medications    albuterol  108 (90 Base) MCG/ACT inhaler Commonly known as: VENTOLIN  HFA Inhale 2 puffs into the lungs every 6 (six) hours as needed for wheezing or shortness of breath.   albuterol  (2.5 MG/3ML) 0.083% nebulizer solution Commonly known as: PROVENTIL  Take 3 mLs (2.5 mg total) by nebulization every 4 (four) hours as needed for wheezing or shortness of breath.   allopurinol  100 MG tablet Commonly known as: ZYLOPRIM  TAKE 1 TABLET BY MOUTH TWICE  DAILY   amLODipine  10 MG tablet Commonly known as: NORVASC  TAKE 1 TABLET BY MOUTH DAILY   atorvastatin  20 MG tablet Commonly known as: LIPITOR TAKE 1 TABLET BY MOUTH IN THE  EVENING   Breztri Aerosphere 160-9-4.8 MCG/ACT Aero inhaler Generic drug: budesonide-glycopyrrolate-formoterol Inhale 2 puffs into the lungs 2 (two) times daily. THROUGH [employer]&Me PAP   clopidogrel  75 MG tablet Commonly known as: PLAVIX  TAKE 1 TABLET BY MOUTH DAILY   fexofenadine 180 MG tablet Commonly known as: ALLEGRA Take 180 mg by mouth daily as needed.   Jardiance  10 MG Tabs tablet Generic drug: empagliflozin  Take 10 mg by mouth daily.   pantoprazole  40 MG tablet Commonly known as: PROTONIX  Take 1 tablet (40 mg total) by mouth daily. Start taking on: July 23, 2024   predniSONE  10 MG tablet Commonly known as: DELTASONE  Take 3 tablets (30 mg total) by mouth daily for 2 days, THEN 2 tablets (20 mg total) daily for 3 days, THEN 1 tablet (10 mg total) daily for 3 days. Start taking on: July 23, 2024   sodium zirconium cyclosilicate  10 g Pack packet Commonly known as: LOKELMA  Take 10 g by mouth daily. Start taking on: July 23, 2024               Durable Medical Equipment  (From admission,  onward)           Start     Ordered   07/19/24 1124  For home use only DME Bipap  Once       Question Answer Comment  Length of Need Lifetime   Bleed in oxygen  (LPM) 2   Inspiratory pressure 18   Expiratory pressure 5      07/19/24 1124            Contact information for follow-up providers     Orlean Alan HERO, FNP Follow up.   Specialty: Family Medicine Why: hospital follow up Contact information: [address] [place] [address] [phone]              Contact information for after-discharge care     Home Medical Care     [facility] .   Service: Home Health Services Contact information: [address] [place]  [phone]                    Discharge Exam: Filed Weights   07/20/24 0500 07/21/24 0447 07/22/24 0500  Weight: 77.1 kg 78.3 kg 78.3 kg   GENERAL:  73 y.o.-year-old Caucasian male patient lying in the bed NECK:  Supple, no jugular venous distention. No thyroid  enlargement, no tenderness.  LUNGS: Bilateral air entry, chronic dyspnea CARDIOVASCULAR: Regular rate and rhythm, S1, S2 normal. No murmurs, rubs, or gallops.  ABDOMEN: Soft, nondistended, nontender. Bowel sounds present. No organomegaly or mass.  EXTREMITIES: No pedal edema NEUROLOGIC: Cranial nerves II through XII are intact. Muscle strength 5/5 in all extremities. Sensation intact. Gait not checked.  PSYCHIATRIC: The patient is alert and oriented x 3.  Normal affect and good eye contact. SKIN: No obvious rash, lesion, or ulcer  Condition at discharge: fair  The results of significant diagnostics from this hospitalization (including imaging, microbiology, ancillary and laboratory) are listed below for reference.   Imaging Studies: DG Chest Port 1 View Result Date: 07/17/2024 EXAM: 1 VIEW(S) XRAY OF THE CHEST 07/17/2024 12:30:00 PM COMPARISON: 07/16/2024 CLINICAL HISTORY: Atelectasis FINDINGS: LUNGS AND PLEURA:  Unchanged band-like opacity in the right upper lobe compatible with post-radiation change. Similar hazy bibasilar pulmonary opacities. Similar trace right and decreased trace left pleural effusion. No pneumothorax. HEART AND MEDIASTINUM: Aortic atherosclerosis. BONES AND SOFT TISSUES: No acute osseous abnormality. IMPRESSION: 1. Similar hazy bibasilar pulmonary opacities with similar trace right and decreased trace left pleural effusion. Electronically signed by: Norman Gatlin MD 07/17/2024 10:48 PM EST RP Workstation: [id number]   DG Chest Port 1 View Result Date: 07/16/2024 CLINICAL DATA:  Right pleural effusion.  Status post thoracentesis. EXAM: PORTABLE CHEST 1 VIEW COMPARISON:  Chest radiograph dated 07/15/2024. FINDINGS: Significant decrease in the right pleural effusion. No detectable pneumothorax. Left lung base atelectasis or infiltrate similar to prior radiograph. Stable cardiac silhouette. No acute osseous pathology. IMPRESSION: Significant decrease in the right pleural effusion. No detectable pneumothorax. Electronically Signed   By: Vanetta Chou M.D.   On: 07/16/2024 14:16   US  THORACENTESIS ASP PLEURAL SPACE W/IMG GUIDE Result Date: 07/16/2024 INDICATION: 73 year old male with history of stage III lung cancer s/p chemoradiation and maintenance chemotherapy, currently in remission, admitted for acute on chronic respiratory failure/ acute COPD exacerbation/CAP, with moderate right pleural effusion. IR was requested for right-sided diagnostic and therapeutic thoracentesis. EXAM: ULTRASOUND GUIDED DIAGNOSTIC AND THERAPEUTIC RIGHT-SIDED THORACENTESIS MEDICATIONS: 6 mL of 1% lidocaine . COMPLICATIONS: None immediate. PROCEDURE: An ultrasound guided thoracentesis was thoroughly discussed with the patient and questions answered. The benefits, risks, alternatives and complications were also discussed. The patient understands and wishes to proceed with the procedure. Written consent was obtained.  Ultrasound was performed to localize and mark an adequate pocket of fluid in the right chest. The area was then prepped and draped in the normal sterile fashion. 1% Lidocaine  was used for local anesthesia. Under ultrasound guidance a 6 Fr Safe-T-Centesis catheter was introduced. Thoracentesis was performed. The catheter was removed and a dressing applied. FINDINGS: A total of approximately 900 mL of clear, straw-colored pleural fluid was removed. Samples were sent to the laboratory as requested by the clinical team. IMPRESSION: Successful ultrasound guided right-sided thoracentesis yielding 900 mL of pleural fluid. Procedure performed by Carlin Griffon, PA-C, and under the direct supervision of Dr. Marcey Moan, MD Electronically Signed   By: Marcey Moan M.D.   On: 07/16/2024 13:40   DG Chest Port 1 View Result Date: 07/15/2024 EXAM: 1 VIEW(S) XRAY OF THE CHEST 07/15/2024 11:20:00 AM COMPARISON: 07/13/2024 CLINICAL HISTORY: Pleural effusion. FINDINGS: LUNGS AND PLEURA: Decreased right pleural effusion with improved aeration of right lung base. Improved hazy opacity at right lung base. Unchanged left basilar opacities. Unchanged bandlike opacity in right upper lobe compatible with radiation change. No pneumothorax. HEART AND MEDIASTINUM: No acute abnormality of the cardiac and mediastinal  silhouettes. BONES AND SOFT TISSUES: No acute osseous abnormality. IMPRESSION: 1. Decreased right pleural effusion with improved aeration of the right lung base. 2. Unchanged left basilar opacities. Electronically signed by: Waddell Calk MD 07/15/2024 11:37 AM EST RP Workstation: [id number]   DG Chest Port 1 View Result Date: 07/13/2024 CLINICAL DATA:  Shortness of breath. EXAM: PORTABLE CHEST 1 VIEW COMPARISON:  July 10, 2024 FINDINGS: The heart size and mediastinal contours are within normal limits. Low lung volumes are noted stable areas of scarring and/or atelectasis are seen within the right upper lobe and mid  right lung. Stable, moderate severity bibasilar scarring, atelectasis and/or infiltrate is noted. There are small bilateral pleural effusions. No pneumothorax is identified. The visualized skeletal structures are unremarkable. IMPRESSION: 1. Stable areas of scarring and/or atelectasis within the right upper lobe and mid right lung, with stable moderate severity bibasilar scarring,. 2. Small bilateral pleural effusions. Electronically Signed   By: Suzen Dials M.D.   On: 07/13/2024 16:22   US  CHEST (PLEURAL EFFUSION) Result Date: 07/13/2024 INDICATION: 73 year old male with history of stage III right upper lobe lung adenocarcinoma, with right side pleural effusion noted on recent CT. IR was requested for diagnostic and therapeutic thoracentesis. EXAM: ULTRASOUND GUIDED RIGHT-SIDED DIAGNOSTIC AND THERAPEUTIC THORACENTESIS MEDICATIONS: None. COMPLICATIONS: N/A PROCEDURE: An ultrasound guided thoracentesis was thoroughly discussed with the patient and questions answered. The benefits, risks, alternatives and complications were also discussed. The patient understands and wishes to proceed with the procedure. Written consent was obtained. Ultrasound was performed to localize and mark an adequate pocket of fluid in the right chest. The area was then prepped and draped in the normal sterile fashion. Unfortunately, patient soon began dropping his O2 saturation, down to 78%, on 4 L by nasal cannula. O2 was increased to 7 L by nasal cannula, but patient remained somnolent, reporting dizziness. Attempts to coach patient through breathing brought patient's O2 saturation up to a max of 87%. However, patient remained dizzy, somnolent. VIR attending, Dr. Jenna, was requested to bedside. Upon evaluation of the patient, Dr. Jenna advised aborting procedure at this time. Sterile field was removed, and patient was leaned back in bed, awaiting transport back to unit. Care team advised by secure chat. FINDINGS: Procedure was  not attempted/ aborted due to patient's symptomatic and critically low O2 saturation, despite best efforts at improving patient's O2 saturation while in Radiology department. IMPRESSION: Aborted attempt at right thoracentesis due to patient's unstable condition. Procedure attempted by: Carlin Griffon, PA-C. Electronically Signed   By: Cordella Jenna   On: 07/13/2024 15:46   ECHOCARDIOGRAM COMPLETE Result Date: 07/12/2024    ECHOCARDIOGRAM REPORT   Patient Name:   Martin Jennings Date of Exam: 07/11/2024 Medical Rec #:  [id number]      Height:       71.0 in Accession #:    [id number]     Weight:       182.8 lb Date of Birth:  11/10/1950      BSA:          2.029 m Patient Age:    73 years       BP:           128/64 mmHg Patient Gender: M              HR:           83 bpm. Exam Location:  [facility] Procedure: 2D Echo, Cardiac Doppler and Color Doppler (Both Spectral and Color  Flow Doppler were utilized during procedure). Indications:     I50.31 Acute Diastolic CHF  History:         Patient has prior history of Echocardiogram examinations, most                  recent 07/30/2022. COPD; Risk Factors:Hypertension. Lung Cancer.  Sonographer:     Carl Coma RDCS Referring Phys:  [id number] UNRYLXTL AGBATA Diagnosing Phys: Evalene Lunger MD IMPRESSIONS  1. Left ventricular ejection fraction, by estimation, is 60 to 65%. The left ventricle has normal function. The left ventricle has no regional wall motion abnormalities. There is mild left ventricular hypertrophy. Left ventricular diastolic parameters are consistent with Grade I diastolic dysfunction (impaired relaxation).  2. Right ventricular systolic function is mildly reduced. The right ventricular size is normal. Tricuspid regurgitation signal is inadequate for assessing PA pressure.  3. The mitral valve is normal in structure. No evidence of mitral valve regurgitation. No evidence of mitral stenosis.  4. The aortic valve is normal in structure. There is  mild calcification of the aortic valve. Aortic valve regurgitation is not visualized. Aortic valve sclerosis/calcification is present, without any evidence of aortic stenosis.  5. The inferior vena cava is normal in size with greater than 50% respiratory variability, suggesting right atrial pressure of 3 mmHg. FINDINGS  Left Ventricle: Left ventricular ejection fraction, by estimation, is 60 to 65%. The left ventricle has normal function. The left ventricle has no regional wall motion abnormalities. Strain was performed and the global longitudinal strain is indeterminate. The left ventricular internal cavity size was normal in size. There is mild left ventricular hypertrophy. Left ventricular diastolic parameters are consistent with Grade I diastolic dysfunction (impaired relaxation). Right Ventricle: The right ventricular size is normal. No increase in right ventricular wall thickness. Right ventricular systolic function is mildly reduced. Tricuspid regurgitation signal is inadequate for assessing PA pressure. Left Atrium: Left atrial size was normal in size. Right Atrium: Right atrial size was normal in size. Pericardium: There is no evidence of pericardial effusion. Mitral Valve: The mitral valve is normal in structure. No evidence of mitral valve regurgitation. No evidence of mitral valve stenosis. Tricuspid Valve: The tricuspid valve is normal in structure. Tricuspid valve regurgitation is mild . No evidence of tricuspid stenosis. Aortic Valve: The aortic valve is normal in structure. There is mild calcification of the aortic valve. Aortic valve regurgitation is not visualized. Aortic valve sclerosis/calcification is present, without any evidence of aortic stenosis. Pulmonic Valve: The pulmonic valve was normal in structure. Pulmonic valve regurgitation is not visualized. No evidence of pulmonic stenosis. Aorta: The aortic root is normal in size and structure. Venous: The inferior vena cava is normal in size  with greater than 50% respiratory variability, suggesting right atrial pressure of 3 mmHg. IAS/Shunts: No atrial level shunt detected by color flow Doppler. Additional Comments: 3D was performed not requiring image post processing on an independent workstation and was indeterminate.  LEFT VENTRICLE PLAX 2D LVIDd:         4.30 cm   Diastology LVIDs:         2.70 cm   LV e' medial:    7.03 cm/s LV PW:         1.10 cm   LV E/e' medial:  8.8 LV IVS:        1.10 cm   LV e' lateral:   11.42 cm/s LVOT diam:     1.90 cm   LV E/e' lateral: 5.4 LV  SV:         56 LV SV Index:   28 LVOT Area:     2.84 cm  RIGHT VENTRICLE             IVC RV Basal diam:  5.00 cm     IVC diam: 1.60 cm RV Mid diam:    4.20 cm RV S prime:     13.57 cm/s TAPSE (M-mode): 1.9 cm LEFT ATRIUM             Index        RIGHT ATRIUM           Index LA diam:        3.40 cm 1.68 cm/m   RA Area:     19.20 cm LA Vol (A2C):   35.0 ml 17.25 ml/m  RA Volume:   59.40 ml  29.27 ml/m LA Vol (A4C):   25.2 ml 12.42 ml/m LA Biplane Vol: 31.3 ml 15.42 ml/m  AORTIC VALVE LVOT Vmax:   112.33 cm/s LVOT Vmean:  73.733 cm/s LVOT VTI:    0.198 m  AORTA Ao Root diam: 3.30 cm Ao Asc diam:  3.10 cm MITRAL VALVE MV Area (PHT): 3.99 cm    SHUNTS MV Decel Time: 190 msec    Systemic VTI:  0.20 m MV E velocity: 62.13 cm/s  Systemic Diam: 1.90 cm MV A velocity: 68.00 cm/s MV E/A ratio:  0.91 Evalene Lunger MD Electronically signed by Evalene Lunger MD Signature Date/Time: 07/12/2024/1:18:45 PM    Final    CT CHEST WO CONTRAST Result Date: 07/11/2024 CLINICAL DATA:  Concern for pneumonia. EXAM: CT CHEST WITHOUT CONTRAST TECHNIQUE: Multidetector CT imaging of the chest was performed following the standard protocol without IV contrast. RADIATION DOSE REDUCTION: This exam was performed according to the departmental dose-optimization program which includes automated exposure control, adjustment of the mA and/or kV according to patient size and/or use of iterative reconstruction  technique. COMPARISON:  Chest radiograph dated 07/10/2024 and CT dated 09/10/2022. FINDINGS: Evaluation of this exam is limited in the absence of intravenous contrast. Cardiovascular: There is mild cardiomegaly. No pericardial effusion. Advanced 3 vessel coronary vascular calcification. Mild atherosclerotic calcification of the thoracic aorta. No aneurysmal dilatation. There is dilatation of the main pulmonary trunk suggestive of pulmonary hypertension. Mediastinum/Nodes: No obvious hilar adenopathy. Evaluation however is limited due to consolidative changes of the lungs. The esophagus is grossly unremarkable no mediastinal fluid collection. Lungs/Pleura: Moderate right pleural effusion. There is partial compressive atelectasis of the right lower lobe versus pneumonia. Background of emphysema and diffuse interstitial coarsening. Progression of bandlike consolidation extending from the right hilum into the anterior right upper lobe since the prior CT likely representing post treatment fibrosis. Pneumonia is not excluded. Additional area of consolidation in the lateral right middle lobe also concerning for pneumonia. No pneumothorax. The central airways are patent. Upper Abdomen: No acute abnormality. Musculoskeletal: No acute osseous pathology. IMPRESSION: 1. Moderate right pleural effusion with partial compressive atelectasis of the right lower lobe versus pneumonia. 2. Progression of bandlike consolidation extending from the right hilum into the anterior right upper lobe since the prior CT likely representing post treatment fibrosis. Pneumonia is not excluded. 3. Additional area of consolidation in the lateral right middle lobe also concerning for pneumonia. 4. Mild cardiomegaly with advanced 3 vessel coronary vascular calcification. 5. Aortic Atherosclerosis (ICD10-I70.0) and Emphysema (ICD10-J43.9). Electronically Signed   By: Vanetta Chou M.D.   On: 07/11/2024 16:20   DG Chest Portable 1  View Result Date:  07/10/2024 CLINICAL DATA:  Hypoxia, cough EXAM: PORTABLE CHEST 1 VIEW COMPARISON:  August 10, 2022.  September 10, 2022. FINDINGS: Stable cardiomediastinal silhouette stable right upper lobe opacity most consistent with fibrosis probable small right pleural effusion is noted. Bibasilar reticular densities are noted concerning for edema or scarring. IMPRESSION: Stable right upper lobe opacity most consistent with scarring. Probable small right pleural effusion is noted. Bibasilar reticular densities are noted concerning for edema or scarring. Electronically Signed   By: Lynwood Landy Raddle M.D.   On: 07/10/2024 16:46    Microbiology: Results for orders placed or performed during the hospital encounter of 07/10/24  Resp panel by RT-PCR (RSV, Flu A&B, Covid) Anterior Nasal Swab     Status: None   Collection Time: 07/10/24  4:33 PM   Specimen: Anterior Nasal Swab  Result Value Ref Range Status   SARS Coronavirus 2 by RT PCR NEGATIVE NEGATIVE Final    Comment: (NOTE) SARS-CoV-2 target nucleic acids are NOT DETECTED.  The SARS-CoV-2 RNA is generally detectable in upper respiratory specimens during the acute phase of infection. The lowest concentration of SARS-CoV-2 viral copies this assay can detect is 138 copies/mL. A negative result does not preclude SARS-Cov-2 infection and should not be used as the sole basis for treatment or other patient management decisions. A negative result may occur with  improper specimen collection/handling, submission of specimen other than nasopharyngeal swab, presence of viral mutation(s) within the areas targeted by this assay, and inadequate number of viral copies(<138 copies/mL). A negative result must be combined with clinical observations, patient history, and epidemiological information. The expected result is Negative.  Fact Sheet for Patients:  bloggercourse.com  Fact Sheet for Healthcare Providers:   seriousbroker.it  This test is no t yet approved or cleared by the [place]  FDA and  has been authorized for detection and/or diagnosis of SARS-CoV-2 by FDA under an Emergency Use Authorization (EUA). This EUA will remain  in effect (meaning this test can be used) for the duration of the COVID-19 declaration under Section 564(b)(1) of the Act, 21 U.S.C.section 360bbb-3(b)(1), unless the authorization is terminated  or revoked sooner.       Influenza A by PCR NEGATIVE NEGATIVE Final   Influenza B by PCR NEGATIVE NEGATIVE Final    Comment: (NOTE) The Xpert Xpress SARS-CoV-2/FLU/RSV plus assay is intended as an aid in the diagnosis of influenza from Nasopharyngeal swab specimens and should not be used as a sole basis for treatment. Nasal washings and aspirates are unacceptable for Xpert Xpress SARS-CoV-2/FLU/RSV testing.  Fact Sheet for Patients: bloggercourse.com  Fact Sheet for Healthcare Providers: seriousbroker.it  This test is not yet approved or cleared by the [place]  FDA and has been authorized for detection and/or diagnosis of SARS-CoV-2 by FDA under an Emergency Use Authorization (EUA). This EUA will remain in effect (meaning this test can be used) for the duration of the COVID-19 declaration under Section 564(b)(1) of the Act, 21 U.S.C. section 360bbb-3(b)(1), unless the authorization is terminated or revoked.     Resp Syncytial Virus by PCR NEGATIVE NEGATIVE Final    Comment: (NOTE) Fact Sheet for Patients: bloggercourse.com  Fact Sheet for Healthcare Providers: seriousbroker.it  This test is not yet approved or cleared by the [place]  FDA and has been authorized for detection and/or diagnosis of SARS-CoV-2 by FDA under an Emergency Use Authorization (EUA). This EUA will remain in effect (meaning this test can be used) for  the duration of the COVID-19  declaration under Section 564(b)(1) of the Act, 21 U.S.C. section 360bbb-3(b)(1), unless the authorization is terminated or revoked.  Performed at [facility], [address] [place], [place] [address]   Blood Culture (routine x 2)     Status: None   Collection Time: 07/10/24  6:06 PM   Specimen: BLOOD  Result Value Ref Range Status   Specimen Description BLOOD BLOOD LEFT ARM  Final   Special Requests   Final    BOTTLES DRAWN AEROBIC AND ANAEROBIC Blood Culture results may not be optimal due to an inadequate volume of blood received in culture bottles   Culture   Final    NO GROWTH 5 DAYS Performed at [facility], [address] [place], [place] [address]    Report Status 07/15/2024 FINAL  Final  Blood Culture (routine x 2)     Status: None   Collection Time: 07/10/24  6:06 PM   Specimen: BLOOD  Result Value Ref Range Status   Specimen Description BLOOD BLOOD LEFT ARM  Final   Special Requests   Final    BOTTLES DRAWN AEROBIC AND ANAEROBIC Blood Culture results may not be optimal due to an inadequate volume of blood received in culture bottles   Culture   Final    NO GROWTH 5 DAYS Performed at [facility], [address] [place], [place] [address]    Report Status 07/15/2024 FINAL  Final  Expectorated Sputum Assessment w Gram Stain, Rflx to Resp Cult     Status: None   Collection Time: 07/13/24 10:57 AM   Specimen: Sputum  Result Value Ref Range Status   Specimen Description SPUTUM  Final   Special Requests NONE  Final   Sputum evaluation   Final    THIS SPECIMEN IS ACCEPTABLE FOR SPUTUM CULTURE Performed at [facility], [address] [place], [place] [address]    Report Status 07/13/2024 FINAL  Final  Culture, Respiratory w Gram Stain     Status: None   Collection Time: 07/13/24 10:57 AM   Specimen: SPU  Result Value Ref Range Status   Specimen Description   Final    SPUTUM Performed at  [facility], [address] [place], [place] [address]    Special Requests   Final    NONE Reflexed from [id number] Performed at [facility], [address] [place], [place] [address]    Gram Stain NO WBC SEEN FEW GRAM VARIABLE ROD   Final   Culture   Final    MODERATE Normal respiratory flora-no Staph aureus or Pseudomonas seen Performed at [facility], [address], [place], [place] [address]    Report Status 07/16/2024 FINAL  Final  Body fluid culture w Gram Stain     Status: None   Collection Time: 07/16/24  1:25 PM   Specimen: PATH Cytology Pleural fluid  Result Value Ref Range Status   Specimen Description   Final    PLEURAL Performed at [facility], [address], [place], [place] [address]    Special Requests   Final    NONE Performed at [facility], [address], [place], [place] [address]    Gram Stain NO WBC SEEN NO ORGANISMS SEEN   Final   Culture   Final    NO GROWTH 3 DAYS Performed at [facility], [address] [place], [place] [address]    Report Status 07/19/2024 FINAL  Final  Labs: CBC: Recent Labs  Lab 07/17/24 0426  WBC 6.2  HGB 15.5  HCT 49.6  MCV 99.6  PLT 99*   Basic Metabolic Panel: Recent Labs  Lab 07/19/24 1017 07/20/24 0439 07/21/24 0543 07/21/24 1537 07/22/24 0532  NA 139 139 139 140 141  K 5.4* 4.7 5.6* 5.2* 4.9  CL 105 105 106 105 107  CO2 27 27 26 26 27   GLUCOSE 152* 134* 169* 152* 118*  BUN 48* 52* 48* 46* 46*  CREATININE 2.57* 2.69* 2.70* 2.66* 2.72*  CALCIUM  8.3* 8.2* 8.5* 8.4* 8.4*   Liver Function Tests: Recent Labs  Lab 07/17/24 0426  AST 24  ALT 31  ALKPHOS 62  BILITOT 0.5  PROT 5.8*  ALBUMIN  3.0*   CBG: Recent Labs  Lab 07/21/24 1203 07/21/24 1645 07/21/24 2106 07/22/24 0721 07/22/24 1147  GLUCAP 211* 177* 204* 94 175*    Discharge time spent: greater than 30 minutes.  Signed: Laree Lock, MD [facility] 07/22/2024 "

## 2024-07-22 NOTE — TOC Transition Note (Signed)
 Transition of Care [facility]) - Discharge Note   Patient Details  Name: Martin Jennings MRN: [id number] Date of Birth: Jan 10, 1951  [facility]) CM/SW Contact:  Victory Jackquline RAMAN, RN Phone Number: 07/22/2024, 3:56 PM   Clinical Narrative:    RNCM received a message from the MD via secure chat informing me that the patient was going to be discharged home today with [facility]. RNCM called and spoke to patient, introduced myself and my role and explained that discharge planning would be discussed. Informed him that the MD was discharging him today and he said that his wife was at the bedside ready to take him home. RNCM confirmed that he was going home with [facility].  No further concerns. RNCM signing off.   Final next level of care: Home w Home Health Services Barriers to Discharge: Barriers Resolved   Patient Goals and CMS Choice            Discharge Placement                Patient to be transferred to facility by: Wife Name of family member notified: Valarie Patient and family notified of of transfer: 07/22/24  Discharge Plan and Services Additional resources added to the After Visit Summary for                                       Social Drivers of Health (SDOH) Interventions SDOH Screenings   Food Insecurity: No Food Insecurity (07/10/2024)  Housing: Low Risk (07/10/2024)  Transportation Needs: No Transportation Needs (07/10/2024)  Utilities: Not At Risk (07/10/2024)  Depression (PHQ2-9): Medium Risk (07/03/2024)  Social Connections: Moderately Isolated (07/10/2024)  Stress: No Stress Concern Present (07/03/2024)  Tobacco Use: Medium Risk (07/10/2024)     Readmission Risk Interventions     No data to display

## 2024-07-22 NOTE — Assessment & Plan Note (Signed)
 Patient is seen by pulmonary, who manage this condition.  He is well controlled with current therapy.   Will defer to them for further changes to plan of care.

## 2024-07-22 NOTE — Assessment & Plan Note (Signed)
 Patient is seen by oncology, who manage this condition.  He is well controlled with current therapy.   Will defer to them for further changes to plan of care.

## 2024-07-22 NOTE — Assessment & Plan Note (Signed)
 Setting patient up for referral to nephrology.  Will defer to them for further treatment changes.  Reassess at follow up.

## 2024-07-23 ENCOUNTER — Telehealth: Payer: Self-pay

## 2024-07-23 ENCOUNTER — Other Ambulatory Visit: Payer: Self-pay

## 2024-07-23 NOTE — Transitions of Care (Post Inpatient/ED Visit) (Signed)
 "  07/23/2024  Name: Martin Jennings MRN: [id number] DOB: January 05, 1951  Today's TOC FU Call Status: Today's TOC FU Call Status:: Successful TOC FU Call Completed TOC FU Call Complete Date: 07/23/24  Patient's Name and Date of Birth confirmed. Name, DOB  Transition Care Management Follow-up Telephone Call Date of Discharge: 07/22/24 Discharge Facility: [facility]) Type of Discharge: Inpatient Admission Primary Inpatient Discharge Diagnosis:: COPD Exacerbation How have you been since you were released from the hospital?: Better Any questions or concerns?: No  Items Reviewed: Did you receive and understand the discharge instructions provided?: Yes Medications obtained,verified, and reconciled?: Yes (Medications Reviewed) Any new allergies since your discharge?: No Dietary orders reviewed?: Yes Type of Diet Ordered:: Low sodium Heart Healthy Do you have support at home?: Yes People in Home [RPT]: spouse Name of Support/Comfort Primary Source: Berwyn Fries  Medications Reviewed Today: Medications Reviewed Today     Reviewed by Moises Reusing, RN (Case Manager) on 07/23/24 at 1251  Med List Status: <None>   Medication Order Taking? Sig Documenting Provider Last Dose Status Informant  albuterol  (PROVENTIL ) (2.5 MG/3ML) 0.083% nebulizer solution [id number]  Take 3 mLs (2.5 mg total) by nebulization every 4 (four) hours as needed for wheezing or shortness of breath. Orlean Alan HERO, FNP  Active Spouse/Significant Other  albuterol  (VENTOLIN  HFA) 108 (90 Base) MCG/ACT inhaler [id number]  Inhale 2 puffs into the lungs every 6 (six) hours as needed for wheezing or shortness of breath. Orlean Alan HERO, FNP  Active Spouse/Significant Other  allopurinol  (ZYLOPRIM ) 100 MG tablet [id number]  TAKE 1 TABLET BY MOUTH TWICE  DAILY Fernand Fredy RAMAN, MD  Active   amLODipine  (NORVASC ) 10 MG tablet [id number]  TAKE 1 TABLET BY MOUTH DAILY Orlean Alan HERO, FNP  Active  Spouse/Significant Other  atorvastatin  (LIPITOR) 20 MG tablet [id number]  TAKE 1 TABLET BY MOUTH IN THE  KARNA Fernand, Fredy RAMAN, MD  Active Spouse/Significant Other  budesonide-glycopyrrolate-formoterol (BREZTRI AEROSPHERE) 160-9-4.8 MCG/ACT AERO inhaler [id number]  Inhale 2 puffs into the lungs 2 (two) times daily. THROUGH [employer] PAP [provider]  Active Spouse/Significant Other  clopidogrel  (PLAVIX ) 75 MG tablet [id number]  TAKE 1 TABLET BY MOUTH DAILY Orlean Alan HERO, FNP  Active Spouse/Significant Other  empagliflozin  (JARDIANCE ) 10 MG TABS tablet [id number]  Take 10 mg by mouth daily. [provider]  Active Spouse/Significant Other  fexofenadine (ALLEGRA) 180 MG tablet [id number]  Take 180 mg by mouth daily as needed. [provider]  Active Spouse/Significant Other  OXYGEN  [id number] Yes Inhale 2 L/min into the lungs as needed. [provider]  Active   pantoprazole  (PROTONIX ) 40 MG tablet [id number]  Take 1 tablet (40 mg total) by mouth daily. Jerelene Critchley, MD  Active   predniSONE  (DELTASONE ) 10 MG tablet [id number]  Take 3 tablets (30 mg total) by mouth daily for 2 days, THEN 2 tablets (20 mg total) daily for 3 days, THEN 1 tablet (10 mg total) daily for 3 days. Jerelene Critchley, MD  Active    Patient not taking:   Discontinued 07/27/20 1859   sodium chloride  flush (NS) 0.9 % injection 10 mL [id number]   Melanee Annah BROCKS, MD  Active   sodium zirconium cyclosilicate  (LOKELMA ) 10 g PACK packet [id number]  Take 10 g by mouth daily. Jerelene Critchley, MD  Active             Home Care and Equipment/Supplies: Were Home Health Services Ordered?: Yes Name of Home Health Agency:: [phone] Has Agency  set up a time to come to your home?: No EMR reviewed for Home Health Orders: Orders present/patient has not received call (refer to CM for follow-up) Any new equipment or medical supplies ordered?: Yes Name of Medical supply agency?: [facility] Were you able to get the  equipment/medical supplies?: Yes Do you have any questions related to the use of the equipment/supplies?: No  Functional Questionnaire: Do you need assistance with bathing/showering or dressing?: No Do you need assistance with meal preparation?: Yes (His spouse assists) Do you need assistance with eating?: No Do you have difficulty maintaining continence: No Do you need assistance with getting out of bed/getting out of a chair/moving?: No Do you have difficulty managing or taking your medications?: Yes (His spouse assists with medications)  Follow up appointments reviewed: PCP Follow-up appointment confirmed?: No (The patient has to call for an appointment as it is a non [facility] practice) MD Provider Line [phone] Given: No Specialist Hospital Follow-up appointment confirmed?: NA Do you need transportation to your follow-up appointment?: No Do you understand care options if your condition(s) worsen?: Yes-patient verbalized understanding  SDOH Interventions Today    Flowsheet Row Most Recent Value  SDOH Interventions   Food Insecurity Interventions Intervention Not Indicated  Housing Interventions Intervention Not Indicated  Transportation Interventions Intervention Not Indicated  Utilities Interventions Intervention Not Indicated    Medford Balboa, BSN, RN [place]  VBCI - Population Health RN Care Manager [phone]  "

## 2024-07-24 NOTE — Progress Notes (Signed)
" ° °  07/24/2024  Patient ID: Martin Jennings Fries, male   DOB: March 14, 1951, 74 y.o.   MRN: [id number]  Received notification from [facility] that patient has been approved through 07/25/25 to receive Jardiance  10mg .  [name], PharmD Clinical Pharmacist [phone]  "

## 2024-07-27 ENCOUNTER — Ambulatory Visit: Admitting: Family

## 2024-07-30 ENCOUNTER — Ambulatory Visit: Admitting: Family

## 2024-07-30 ENCOUNTER — Encounter: Payer: Self-pay | Admitting: Family

## 2024-07-30 VITALS — BP 144/90 | HR 82 | Ht 71.0 in | Wt 176.0 lb

## 2024-07-30 DIAGNOSIS — N1832 Chronic kidney disease, stage 3b: Secondary | ICD-10-CM

## 2024-07-30 DIAGNOSIS — E875 Hyperkalemia: Secondary | ICD-10-CM

## 2024-07-30 DIAGNOSIS — J9621 Acute and chronic respiratory failure with hypoxia: Secondary | ICD-10-CM

## 2024-07-30 DIAGNOSIS — C3490 Malignant neoplasm of unspecified part of unspecified bronchus or lung: Secondary | ICD-10-CM

## 2024-07-30 DIAGNOSIS — Z936 Other artificial openings of urinary tract status: Secondary | ICD-10-CM

## 2024-07-30 DIAGNOSIS — J441 Chronic obstructive pulmonary disease with (acute) exacerbation: Secondary | ICD-10-CM

## 2024-07-30 DIAGNOSIS — I5033 Acute on chronic diastolic (congestive) heart failure: Secondary | ICD-10-CM

## 2024-07-31 ENCOUNTER — Telehealth: Payer: Self-pay | Admitting: Family

## 2024-07-31 NOTE — Telephone Encounter (Signed)
 Physical Therapist notified of verbal order

## 2024-07-31 NOTE — Telephone Encounter (Signed)
 Left a vm.

## 2024-07-31 NOTE — Telephone Encounter (Signed)
 Darryle, PT, left VM requesting verbal orders for PT 2w2 then 1w1.  Per Henry schein, this is fine.   Callback # [phone]

## 2024-08-04 ENCOUNTER — Other Ambulatory Visit: Payer: Self-pay | Admitting: Family

## 2024-08-06 NOTE — Progress Notes (Signed)
 Martin Jennings                                          MRN: [id number]   08/06/2024   The VBCI Quality Team Specialist reviewed this patient medical record for the purposes of chart review for care gap closure. The following were reviewed: abstraction for care gap closure-glycemic status assessment.    VBCI Quality Team

## 2024-08-07 ENCOUNTER — Encounter: Payer: Self-pay | Admitting: Family

## 2024-08-09 NOTE — Progress Notes (Signed)
 Martin Jennings                                          MRN: [id number]   08/09/2024   The VBCI Quality Team Specialist reviewed this patient medical record for the purposes of chart review for care gap closure. The following were reviewed: chart review for care gap closure-kidney health evaluation for diabetes:eGFR  and uACR.    VBCI Quality Team

## 2024-10-01 ENCOUNTER — Ambulatory Visit: Admitting: Family
